# Patient Record
Sex: Female | Born: 1935 | Race: White | Hispanic: No | Smoking: Former smoker
Health system: Southern US, Community
[De-identification: ages and names within clinical notes are randomized; demographics above are authoritative.]

## PROBLEM LIST (undated history)

## (undated) DIAGNOSIS — E1140 Type 2 diabetes mellitus with diabetic neuropathy, unspecified: Secondary | ICD-10-CM

## (undated) DIAGNOSIS — I2510 Atherosclerotic heart disease of native coronary artery without angina pectoris: Secondary | ICD-10-CM

## (undated) DIAGNOSIS — I509 Heart failure, unspecified: Secondary | ICD-10-CM

## (undated) DIAGNOSIS — G2581 Restless legs syndrome: Secondary | ICD-10-CM

## (undated) DIAGNOSIS — J449 Chronic obstructive pulmonary disease, unspecified: Secondary | ICD-10-CM

## (undated) DIAGNOSIS — C189 Malignant neoplasm of colon, unspecified: Secondary | ICD-10-CM

## (undated) DIAGNOSIS — K219 Gastro-esophageal reflux disease without esophagitis: Secondary | ICD-10-CM

## (undated) DIAGNOSIS — N184 Chronic kidney disease, stage 4 (severe): Secondary | ICD-10-CM

## (undated) DIAGNOSIS — I255 Ischemic cardiomyopathy: Secondary | ICD-10-CM

## (undated) DIAGNOSIS — M1990 Unspecified osteoarthritis, unspecified site: Secondary | ICD-10-CM

## (undated) DIAGNOSIS — M109 Gout, unspecified: Secondary | ICD-10-CM

## (undated) DIAGNOSIS — E039 Hypothyroidism, unspecified: Secondary | ICD-10-CM

## (undated) DIAGNOSIS — I10 Essential (primary) hypertension: Secondary | ICD-10-CM

## (undated) DIAGNOSIS — M5136 Other intervertebral disc degeneration, lumbar region: Secondary | ICD-10-CM

## (undated) DIAGNOSIS — E119 Type 2 diabetes mellitus without complications: Secondary | ICD-10-CM

## (undated) DIAGNOSIS — M51369 Other intervertebral disc degeneration, lumbar region without mention of lumbar back pain or lower extremity pain: Secondary | ICD-10-CM

## (undated) HISTORY — DX: Malignant neoplasm of colon, unspecified: C18.9

## (undated) HISTORY — DX: Type 2 diabetes mellitus without complications: E11.9

## (undated) HISTORY — DX: Essential (primary) hypertension: I10

## (undated) HISTORY — PX: ABDOMINAL HYSTERECTOMY: SHX81

## (undated) HISTORY — DX: Hypothyroidism, unspecified: E03.9

## (undated) HISTORY — PX: CORONARY ARTERY BYPASS GRAFT: SHX141

## (undated) HISTORY — DX: Ischemic cardiomyopathy: I25.5

## (undated) HISTORY — PX: ILIAC ARTERY STENT: SHX1786

## (undated) HISTORY — PX: THYROID SURGERY: SHX805

## (undated) HISTORY — PX: CHOLECYSTECTOMY: SHX55

## (undated) HISTORY — PX: RENAL ARTERY STENT: SHX2321

## (undated) HISTORY — DX: Chronic kidney disease, stage 4 (severe): N18.4

## (undated) HISTORY — PX: COLON RESECTION: SHX5231

---

## 2010-07-05 HISTORY — PX: LUNG SURGERY: SHX703

## 2010-07-25 DIAGNOSIS — I739 Peripheral vascular disease, unspecified: Secondary | ICD-10-CM | POA: Insufficient documentation

## 2010-07-25 DIAGNOSIS — I2510 Atherosclerotic heart disease of native coronary artery without angina pectoris: Secondary | ICD-10-CM | POA: Insufficient documentation

## 2010-07-25 DIAGNOSIS — E785 Hyperlipidemia, unspecified: Secondary | ICD-10-CM | POA: Insufficient documentation

## 2010-07-25 DIAGNOSIS — E039 Hypothyroidism, unspecified: Secondary | ICD-10-CM | POA: Insufficient documentation

## 2011-03-01 DIAGNOSIS — C189 Malignant neoplasm of colon, unspecified: Secondary | ICD-10-CM | POA: Insufficient documentation

## 2012-05-09 DIAGNOSIS — F419 Anxiety disorder, unspecified: Secondary | ICD-10-CM | POA: Insufficient documentation

## 2016-06-03 DIAGNOSIS — J449 Chronic obstructive pulmonary disease, unspecified: Secondary | ICD-10-CM | POA: Insufficient documentation

## 2017-03-20 ENCOUNTER — Emergency Department (HOSPITAL_COMMUNITY): Payer: Medicare Other

## 2017-03-20 ENCOUNTER — Inpatient Hospital Stay (HOSPITAL_COMMUNITY)
Admission: EM | Admit: 2017-03-20 | Discharge: 2017-03-22 | DRG: 690 | Disposition: A | Discharge status: Home / Self Care | Payer: Medicare Other | Attending: Internal Medicine | Admitting: Internal Medicine

## 2017-03-20 ENCOUNTER — Encounter (HOSPITAL_COMMUNITY): Payer: Self-pay | Admitting: Cardiology

## 2017-03-20 DIAGNOSIS — Z85038 Personal history of other malignant neoplasm of large intestine: Secondary | ICD-10-CM | POA: Diagnosis not present

## 2017-03-20 DIAGNOSIS — E1122 Type 2 diabetes mellitus with diabetic chronic kidney disease: Secondary | ICD-10-CM | POA: Diagnosis present

## 2017-03-20 DIAGNOSIS — E1140 Type 2 diabetes mellitus with diabetic neuropathy, unspecified: Secondary | ICD-10-CM | POA: Diagnosis not present

## 2017-03-20 DIAGNOSIS — E1121 Type 2 diabetes mellitus with diabetic nephropathy: Secondary | ICD-10-CM | POA: Diagnosis not present

## 2017-03-20 DIAGNOSIS — Z951 Presence of aortocoronary bypass graft: Secondary | ICD-10-CM

## 2017-03-20 DIAGNOSIS — Z794 Long term (current) use of insulin: Secondary | ICD-10-CM

## 2017-03-20 DIAGNOSIS — N183 Chronic kidney disease, stage 3 unspecified: Secondary | ICD-10-CM | POA: Diagnosis present

## 2017-03-20 DIAGNOSIS — N179 Acute kidney failure, unspecified: Secondary | ICD-10-CM | POA: Diagnosis present

## 2017-03-20 DIAGNOSIS — G8929 Other chronic pain: Secondary | ICD-10-CM | POA: Diagnosis not present

## 2017-03-20 DIAGNOSIS — M6281 Muscle weakness (generalized): Secondary | ICD-10-CM | POA: Diagnosis not present

## 2017-03-20 DIAGNOSIS — R531 Weakness: Secondary | ICD-10-CM

## 2017-03-20 DIAGNOSIS — Z79899 Other long term (current) drug therapy: Secondary | ICD-10-CM | POA: Diagnosis not present

## 2017-03-20 DIAGNOSIS — Z79891 Long term (current) use of opiate analgesic: Secondary | ICD-10-CM

## 2017-03-20 DIAGNOSIS — Z881 Allergy status to other antibiotic agents status: Secondary | ICD-10-CM

## 2017-03-20 DIAGNOSIS — I129 Hypertensive chronic kidney disease with stage 1 through stage 4 chronic kidney disease, or unspecified chronic kidney disease: Secondary | ICD-10-CM | POA: Diagnosis present

## 2017-03-20 DIAGNOSIS — N3000 Acute cystitis without hematuria: Secondary | ICD-10-CM

## 2017-03-20 DIAGNOSIS — I10 Essential (primary) hypertension: Secondary | ICD-10-CM | POA: Diagnosis present

## 2017-03-20 DIAGNOSIS — Z800 Family history of malignant neoplasm of digestive organs: Secondary | ICD-10-CM

## 2017-03-20 DIAGNOSIS — E119 Type 2 diabetes mellitus without complications: Secondary | ICD-10-CM

## 2017-03-20 DIAGNOSIS — Z8249 Family history of ischemic heart disease and other diseases of the circulatory system: Secondary | ICD-10-CM | POA: Diagnosis not present

## 2017-03-20 DIAGNOSIS — M069 Rheumatoid arthritis, unspecified: Secondary | ICD-10-CM | POA: Diagnosis not present

## 2017-03-20 DIAGNOSIS — Z803 Family history of malignant neoplasm of breast: Secondary | ICD-10-CM | POA: Diagnosis not present

## 2017-03-20 DIAGNOSIS — E860 Dehydration: Secondary | ICD-10-CM | POA: Diagnosis present

## 2017-03-20 DIAGNOSIS — N390 Urinary tract infection, site not specified: Principal | ICD-10-CM | POA: Diagnosis present

## 2017-03-20 DIAGNOSIS — Z23 Encounter for immunization: Secondary | ICD-10-CM

## 2017-03-20 DIAGNOSIS — N3001 Acute cystitis with hematuria: Secondary | ICD-10-CM | POA: Diagnosis not present

## 2017-03-20 HISTORY — DX: Essential (primary) hypertension: I10

## 2017-03-20 HISTORY — DX: Unspecified osteoarthritis, unspecified site: M19.90

## 2017-03-20 HISTORY — DX: Atherosclerotic heart disease of native coronary artery without angina pectoris: I25.10

## 2017-03-20 LAB — CBC WITH DIFFERENTIAL/PLATELET
BASOS PCT: 0 %
Basophils Absolute: 0 10*3/uL (ref 0.0–0.1)
EOS ABS: 0.1 10*3/uL (ref 0.0–0.7)
Eosinophils Relative: 0 %
HEMATOCRIT: 45.8 % (ref 36.0–46.0)
HEMOGLOBIN: 15 g/dL (ref 12.0–15.0)
LYMPHS ABS: 0.9 10*3/uL (ref 0.7–4.0)
Lymphocytes Relative: 5 %
MCH: 31.8 pg (ref 26.0–34.0)
MCHC: 32.8 g/dL (ref 30.0–36.0)
MCV: 97 fL (ref 78.0–100.0)
Monocytes Absolute: 1.5 10*3/uL — ABNORMAL HIGH (ref 0.1–1.0)
Monocytes Relative: 9 %
NEUTROS ABS: 13.7 10*3/uL — AB (ref 1.7–7.7)
NEUTROS PCT: 86 %
Platelets: 214 10*3/uL (ref 150–400)
RBC: 4.72 MIL/uL (ref 3.87–5.11)
RDW: 13.1 % (ref 11.5–15.5)
WBC: 16.1 10*3/uL — AB (ref 4.0–10.5)

## 2017-03-20 LAB — COMPREHENSIVE METABOLIC PANEL
ALT: 18 U/L (ref 14–54)
ANION GAP: 10 (ref 5–15)
AST: 25 U/L (ref 15–41)
Albumin: 3.6 g/dL (ref 3.5–5.0)
Alkaline Phosphatase: 63 U/L (ref 38–126)
BUN: 29 mg/dL — ABNORMAL HIGH (ref 6–20)
CALCIUM: 9.5 mg/dL (ref 8.9–10.3)
CHLORIDE: 105 mmol/L (ref 101–111)
CO2: 25 mmol/L (ref 22–32)
CREATININE: 2.14 mg/dL — AB (ref 0.44–1.00)
GFR, EST AFRICAN AMERICAN: 23 mL/min — AB (ref >60)
GFR, EST NON AFRICAN AMERICAN: 20 mL/min — AB (ref >60)
Glucose, Bld: 142 mg/dL — ABNORMAL HIGH (ref 65–99)
Potassium: 4.6 mmol/L (ref 3.5–5.1)
Sodium: 140 mmol/L (ref 135–145)
Total Bilirubin: 1 mg/dL (ref 0.3–1.2)
Total Protein: 6.9 g/dL (ref 6.5–8.1)

## 2017-03-20 LAB — URINALYSIS, ROUTINE W REFLEX MICROSCOPIC
Bilirubin Urine: NEGATIVE
Glucose, UA: NEGATIVE mg/dL
Hgb urine dipstick: NEGATIVE
KETONES UR: NEGATIVE mg/dL
Nitrite: NEGATIVE
PROTEIN: NEGATIVE mg/dL
Specific Gravity, Urine: 1.018 (ref 1.005–1.030)
pH: 5 (ref 5.0–8.0)

## 2017-03-20 LAB — I-STAT CG4 LACTIC ACID, ED
LACTIC ACID, VENOUS: 1.2 mmol/L (ref 0.5–1.9)
LACTIC ACID, VENOUS: 0.88 mmol/L (ref 0.5–1.9)

## 2017-03-20 LAB — I-STAT TROPONIN, ED: TROPONIN I, POC: 0 ng/mL (ref 0.00–0.08)

## 2017-03-20 LAB — GLUCOSE, CAPILLARY
Glucose-Capillary: 115 mg/dL — ABNORMAL HIGH (ref 65–99)
Glucose-Capillary: 113 mg/dL — ABNORMAL HIGH (ref 65–99)

## 2017-03-20 MED ORDER — HEPARIN SODIUM (PORCINE) 5000 UNIT/ML IJ SOLN
5000.0000 [IU] | Freq: Three times a day (TID) | INTRAMUSCULAR | Status: DC
  Administered 2017-03-20 – 2017-03-22 (×5): 5000 [IU] via SUBCUTANEOUS
  Filled 2017-03-20 (×5): qty 1

## 2017-03-20 MED ORDER — MIRTAZAPINE 15 MG PO TABS
15.0000 mg | ORAL_TABLET | Freq: Every day | ORAL | Status: DC
  Administered 2017-03-21: 15 mg via ORAL
  Filled 2017-03-20: qty 1

## 2017-03-20 MED ORDER — SODIUM CHLORIDE 0.9 % IV SOLN
INTRAVENOUS | Status: AC
  Administered 2017-03-20: 17:00:00 via INTRAVENOUS

## 2017-03-20 MED ORDER — ONDANSETRON HCL 4 MG PO TABS: 4.0000 mg | ORAL_TABLET | Freq: Four times a day (QID) | ORAL | Status: DC | PRN

## 2017-03-20 MED ORDER — CEFTRIAXONE SODIUM 1 G IJ SOLR
1.0000 g | Freq: Once | INTRAMUSCULAR | Status: AC
  Administered 2017-03-20: 1 g via INTRAVENOUS
  Filled 2017-03-20: qty 10

## 2017-03-20 MED ORDER — INFLUENZA VAC SPLIT HIGH-DOSE 0.5 ML IM SUSY
0.5000 mL | PREFILLED_SYRINGE | INTRAMUSCULAR | Status: AC
  Administered 2017-03-21: 0.5 mL via INTRAMUSCULAR
  Filled 2017-03-20: qty 0.5

## 2017-03-20 MED ORDER — OMEGA-3-ACID ETHYL ESTERS 1 G PO CAPS
1.0000 g | ORAL_CAPSULE | Freq: Every day | ORAL | Status: DC
  Administered 2017-03-20 – 2017-03-22 (×3): 1 g via ORAL
  Filled 2017-03-20 (×3): qty 1

## 2017-03-20 MED ORDER — INSULIN ASPART 100 UNIT/ML ~~LOC~~ SOLN
0.0000 [IU] | Freq: Three times a day (TID) | SUBCUTANEOUS | Status: DC
  Administered 2017-03-21: 1 [IU] via SUBCUTANEOUS

## 2017-03-20 MED ORDER — FAMOTIDINE 20 MG PO TABS
20.0000 mg | ORAL_TABLET | Freq: Two times a day (BID) | ORAL | Status: DC
  Administered 2017-03-20 – 2017-03-22 (×4): 20 mg via ORAL
  Filled 2017-03-20 (×4): qty 1

## 2017-03-20 MED ORDER — INSULIN GLARGINE 100 UNITS/ML SOLOSTAR PEN
26.0000 [IU] | PEN_INJECTOR | Freq: Every day | SUBCUTANEOUS | Status: DC
  Filled 2017-03-20: qty 3

## 2017-03-20 MED ORDER — VITAMIN D 1000 UNITS PO TABS
5000.0000 [IU] | ORAL_TABLET | Freq: Every day | ORAL | Status: DC
  Administered 2017-03-20 – 2017-03-22 (×3): 5000 [IU] via ORAL
  Filled 2017-03-20 (×4): qty 5

## 2017-03-20 MED ORDER — SODIUM CHLORIDE 0.9 % IV BOLUS (SEPSIS)
1000.0000 mL | Freq: Once | INTRAVENOUS | Status: AC
  Administered 2017-03-20: 1000 mL via INTRAVENOUS

## 2017-03-20 MED ORDER — HYDROCODONE-ACETAMINOPHEN 10-325 MG PO TABS
1.0000 | ORAL_TABLET | Freq: Four times a day (QID) | ORAL | Status: DC | PRN
Start: 2017-03-20 — End: 2017-03-22

## 2017-03-20 MED ORDER — VITAMIN D3 125 MCG (5000 UT) PO CAPS: 1.0000 | ORAL_CAPSULE | Freq: Every day | ORAL | Status: DC

## 2017-03-20 MED ORDER — ONDANSETRON HCL 4 MG/2ML IJ SOLN: 4.0000 mg | Freq: Four times a day (QID) | INTRAMUSCULAR | Status: DC | PRN

## 2017-03-20 MED ORDER — DEXTROSE 5 % IV SOLN
1.0000 g | INTRAVENOUS | Status: DC
  Administered 2017-03-21: 1 g via INTRAVENOUS
  Filled 2017-03-20 (×2): qty 10

## 2017-03-20 MED ORDER — OMEGA-3 FISH OIL 1000 MG PO CAPS: 1.0000 | ORAL_CAPSULE | Freq: Every day | ORAL | Status: DC

## 2017-03-20 MED ORDER — GABAPENTIN 300 MG PO CAPS
300.0000 mg | ORAL_CAPSULE | Freq: Three times a day (TID) | ORAL | Status: DC
  Administered 2017-03-20 – 2017-03-22 (×6): 300 mg via ORAL
  Filled 2017-03-20 (×6): qty 1

## 2017-03-20 MED ORDER — HYDROXYCHLOROQUINE SULFATE 200 MG PO TABS
400.0000 mg | ORAL_TABLET | Freq: Every day | ORAL | Status: DC
  Administered 2017-03-21 – 2017-03-22 (×2): 400 mg via ORAL
  Filled 2017-03-20 (×5): qty 2

## 2017-03-20 NOTE — ED Triage Notes (Signed)
Woke up at 730 this morning with dizziness and weakness and went back to bed.  Woke up with the same symptoms.  CBG 127.  Temp 99.9.  Pt not orthostatic with EMS.

## 2017-03-20 NOTE — ED Provider Notes (Signed)
Emergency Department Provider Note   I have reviewed the triage vital signs and the nursing notes.   HISTORY  Chief Complaint Weakness   HPI Debra [name] is a 83 y.o. female with PMH of DM, HTN, and arthritis presents to the emergency department for evaluation of severe lightheadedness and near-syncope. The patient woke up at 7:30 this morning with these symptoms but went back to sleep. When she woke up later the symptoms remained. She denies any vertigo. She got up to go the bathroom and felt like she may pass out but did not. She denies any pain in the chest, difficulty breathing, palpitations. She denies any abdominal pain, nausea, vomiting, diarrhea. She continues to have her chronic lower back pain that is not new or different. Denies any dysuria, hesitancy, urgency. No recent medication changes. Family at bedside note shaking chills this AM. Temp 99 F with EMS.    Past Medical History:  Diagnosis Date  . Arthritis   . Cancer [facility])    colon cancer  . Coronary artery disease   . Diabetes mellitus without complication ([place])   . Hypertension   . Renal disorder    Pt has only one functioning kidney     Patient Active Problem List   Diagnosis Date Noted  . UTI (urinary tract infection) 03/20/2017  . Hypertension 03/20/2017  . AKI (acute kidney injury) ([place]) 03/20/2017  . Chronic renal insufficiency, stage 3 (moderate) 03/20/2017  . Rheumatoid arthritis ([place]) 03/20/2017  . Type 2 diabetes mellitus ([place]) 03/20/2017    Past Surgical History:  Procedure Laterality Date  . COLON RESECTION    . CORONARY ARTERY BYPASS GRAFT        Allergies Ciprofloxacin  Family History  Problem Relation Age of Onset  . Colon cancer Mother   . Heart disease Father   . Breast cancer Sister     Social History Social History  Substance Use Topics  . Smoking status: Never Smoker  . Smokeless tobacco: Never Used  . Alcohol use No    Review of Systems  Constitutional: No  fever. Positive rigors with generalized weakness and near-syncope.  Eyes: No visual changes. ENT: No sore throat. Cardiovascular: Denies chest pain. Respiratory: Denies shortness of breath. Gastrointestinal: No abdominal pain.  No nausea, no vomiting.  No diarrhea.  No constipation. Genitourinary: Negative for dysuria. Musculoskeletal: Negative for back pain. Skin: Negative for rash. Neurological: Negative for headaches, focal weakness or numbness.  10-point ROS otherwise negative.  ____________________________________________   PHYSICAL EXAM:  VITAL SIGNS: ED Triage Vitals  Enc Vitals Group     BP 03/20/17 1304 (!) 104/52     Pulse Rate 03/20/17 1304 73     Resp 03/20/17 1304 (!) 27     Temp 03/20/17 1304 99.4 F (37.4 C)     Temp Source 03/20/17 1304 Oral     SpO2 03/20/17 1304 94 %     Weight 03/20/17 1302 185 lb (83.9 kg)     Height 03/20/17 1302 5\' 7"  (1.702 m)     Pain Score 03/20/17 1259 7   Constitutional: Alert but very fatigued and weak. No acute distress.  Eyes: Conjunctivae are normal.  Head: Atraumatic. Nose: No congestion/rhinnorhea. Mouth/Throat: Mucous membranes are moist. Neck: No stridor.   Cardiovascular: Normal rate, regular rhythm. Good peripheral circulation. Grossly normal heart sounds.   Respiratory: Mild increased respiratory effort.  No retractions. Lungs CTAB. Gastrointestinal: Soft and nontender. No distention.  Musculoskeletal: No lower extremity tenderness nor edema.  No gross deformities of extremities. Neurologic:  Normal speech and language. No gross focal neurologic deficits are appreciated.  Skin:  Skin is warm, dry and intact. No rash noted.   ____________________________________________   LABS (all labs ordered are listed, but only abnormal results are displayed)  Labs Reviewed  COMPREHENSIVE METABOLIC PANEL - Abnormal; Notable for the following:       Result Value   Glucose, Bld 142 (*)    BUN 29 (*)    Creatinine, Ser 2.14  (*)    GFR calc non Af Amer 20 (*)    GFR calc Af Amer 23 (*)    All other components within normal limits  CBC WITH DIFFERENTIAL/PLATELET - Abnormal; Notable for the following:    WBC 16.1 (*)    Neutro Abs 13.7 (*)    Monocytes Absolute 1.5 (*)    All other components within normal limits  URINALYSIS, ROUTINE W REFLEX MICROSCOPIC - Abnormal; Notable for the following:    APPearance HAZY (*)    Leukocytes, UA MODERATE (*)    Bacteria, UA RARE (*)    Squamous Epithelial / LPF 6-30 (*)    All other components within normal limits  GLUCOSE, CAPILLARY - Abnormal; Notable for the following:    Glucose-Capillary 115 (*)    All other components within normal limits  CULTURE, BLOOD (ROUTINE X 2)  CULTURE, BLOOD (ROUTINE X 2)  URINE CULTURE  CBC WITH DIFFERENTIAL/PLATELET  BASIC METABOLIC PANEL  I-STAT CG4 LACTIC ACID, ED  I-STAT TROPONIN, ED  I-STAT CG4 LACTIC ACID, ED   ____________________________________________  EKG   EKG Interpretation  Date/Time:  Sunday March 20 2017 13:05:52 EDT Ventricular Rate:  71 PR Interval:    QRS Duration: 93 QT Interval:  372 QTC Calculation: 405 R Axis:   87 Text Interpretation:  Sinus rhythm Atrial premature complexes Borderline right axis deviation Low voltage, precordial leads Nonspecific T abnrm, anterolateral leads No STEMI.  Confirmed by Nanda Quinton [phone]) on 03/20/2017 1:36:41 PM       ____________________________________________  RADIOLOGY  Dg Chest 2 View  Result Date: 03/20/2017 CLINICAL DATA:  Rigors.  Lightheadedness. EXAM: CHEST  2 VIEW COMPARISON:  None FINDINGS: Moderate cardiac enlargement. Previous median sternotomy. No pleural effusion or edema. No airspace opacities identified. IMPRESSION: 1. No acute cardiopulmonary abnormalities. Electronically Signed   By: Kerby Moors M.D.   On: 03/20/2017 14:52    ____________________________________________   PROCEDURES  Procedure(s) performed:    Procedures  None ____________________________________________   INITIAL IMPRESSION / ASSESSMENT AND PLAN / ED COURSE  Pertinent labs & imaging results that were available during my care of the patient were reviewed by me and considered in my medical decision making (see chart for details).  Patient presents to the emergency department for evaluation of significant lightheadedness and near syncope with associated shaking chills. Afebrile. No pain. No focal neurological deficits. Plan for sepsis workup. No indication at this time for CT imaging of the head or abdomen. Her abdomen is soft and nontender. Back pain described as chronic and unchanged. Plan for IVF but hold on abx for now.   Patient with UTI on labs. Suspect that this is the cause of weakness. Patient also with worsening renal function. Care Everywhere shows baseline Creatinine around 1.5-1.8. Continue IVF and started Rocephin.   Discussed patient's case with Hospitalist, Dr. Olevia Bowens to request admission. Patient and family (if present) updated with plan. Care transferred to Hospitalist service.  I reviewed all nursing notes, vitals, pertinent  old records, EKGs, labs, imaging (as available).   ____________________________________________  FINAL CLINICAL IMPRESSION(S) / ED DIAGNOSES  Final diagnoses:  Acute cystitis without hematuria  Weakness  AKI (acute kidney injury) ([place])     MEDICATIONS GIVEN DURING THIS VISIT:  Medications  famotidine (PEPCID) tablet 20 mg (not administered)  HYDROcodone-acetaminophen (NORCO) 10-325 MG per tablet 1 tablet (not administered)  gabapentin (NEURONTIN) capsule 300 mg (300 mg Oral Given 03/20/17 1720)  hydroxychloroquine (PLAQUENIL) tablet 400 mg (not administered)  mirtazapine (REMERON) tablet 15 mg (not administered)  heparin injection 5,000 Units (5,000 Units Subcutaneous Given 03/20/17 1719)  0.9 %  sodium chloride infusion ( Intravenous New Bag/Given 03/20/17 1711)  ondansetron  (ZOFRAN) tablet 4 mg (not administered)    Or  ondansetron (ZOFRAN) injection 4 mg (not administered)  insulin aspart (novoLOG) injection 0-9 Units (0 Units Subcutaneous Not Given 03/20/17 1711)  cholecalciferol (VITAMIN D) tablet 5,000 Units (5,000 Units Oral Given 03/20/17 1720)  omega-3 acid ethyl esters (LOVAZA) capsule 1 g (1 g Oral Given 03/20/17 1720)  Influenza vac split quadrivalent PF (FLUZONE HIGH-DOSE) injection 0.5 mL (not administered)  insulin glargine (LANTUS) Solostar Pen 26 Units (not administered)  cefTRIAXone (ROCEPHIN) 1 g in dextrose 5 % 50 mL IVPB (not administered)  sodium chloride 0.9 % bolus 1,000 mL (0 mLs Intravenous Stopped 03/20/17 1452)  cefTRIAXone (ROCEPHIN) 1 g in dextrose 5 % 50 mL IVPB (0 g Intravenous Stopped 03/20/17 1621)     NEW OUTPATIENT MEDICATIONS STARTED DURING THIS VISIT:  None   Note:  This document was prepared using Dragon voice recognition software and may include unintentional dictation errors.  Nanda Quinton, MD Emergency Medicine   Long, [facility], MD 03/20/17 2009

## 2017-03-20 NOTE — H&P (Signed)
History and Physical    Debra [name] DEY:[id number] DOB: January 25, 1929 DOA: 03/20/2017  PCP: System, Provider Not In (  Patient coming from: Home.  I have personally briefly reviewed patient's old medical records in [place]  Chief Complaint: Weakness  HPI: Debra [name] is a 83 y.o. female with medical history significant of osteoarthritis, rheumatoid arthritis, history of colon cancer, type 2 diabetes, hypertension, renal disorder who is coming to the emergency department with complaints of waking up dizzy and weak around 7:30 AM in the morning, falling back to sleep for a couple hours hours and waking up with similar symptoms. She also mentions that she had a 99.9 temperature at home and EMS descries the patient being orthostatic on their initial evaluation. She denies chest pain, palpitations, diaphoresis, lower extremity edema, PND, orthopnea, abdominal pain, nausea, emesis, diarrhea, melena, hematochezia, dysuria, frequency or hematuria. Denies polyuria, polydipsia, polyphagia or blurred vision. She mentions that she was in her usual state of health yesterday evening when she went to sleep.  ED Course: Initial vital signs in the emergency department were temperature 99.71F, pulse 73, respirations 27, O2 sat 94% on room air and blood pressure 104/52 mmHg. She received a liter of normal saline, 1 g of ceftriaxone IVPB in the emergency department.  Her workup shows WBC is 16.1 with 86% neutrophils, hemoglobin of 15 g/dL and platelets 214. Sodium 140, potassium 4.6, chloride 105, bicarbonate 25 and lactic acid 1.2 mmol/L. BUN was 29, creatinine 2.14 (care everywhere shows baseline creatinine between 1.5-1.8) and glucose 142 mg/dL. Her LFTs were normal. Blood and urine cultures and sensitivity were sent to the lab.  Review of Systems: As per HPI otherwise 10 point review of systems negative.    Past Medical History:  Diagnosis Date  . Arthritis   . Cancer [facility])    colon cancer  .  Diabetes mellitus without complication ([place])   . Hypertension   . Renal disorder    Pt has only one functioning kidney     Past Surgical History:  Procedure Laterality Date  . COLON RESECTION    . CORONARY ARTERY BYPASS GRAFT       reports that she has never smoked. She has never used smokeless tobacco. She reports that she does not drink alcohol or use drugs.  Allergies  Allergen Reactions  . Ciprofloxacin Rash   Family History  Problem Relation Age of Onset  . Colon cancer Mother   . Heart disease Father   . Breast cancer Sister     Prior to Admission medications   Medication Sig Start Date End Date Taking? Authorizing Provider  amLODipine (NORVASC) 10 MG tablet Take 10 mg by mouth daily.   Yes [provider]  Cholecalciferol (VITAMIN D3) 5000 units CAPS Take 1 capsule by mouth daily.   Yes [provider]  gabapentin (NEURONTIN) 300 MG capsule Take 300 mg by mouth 3 (three) times daily.   Yes [provider]  HYDROcodone-acetaminophen (NORCO) 10-325 MG tablet Take 1 tablet by mouth every 6 (six) hours as needed for moderate pain or severe pain.   Yes [provider]  hydroxychloroquine (PLAQUENIL) 200 MG tablet Take 400 mg by mouth daily.   Yes [provider]  insulin glargine (LANTUS) 100 unit/mL SOPN Inject 26 Units into the skin at bedtime.   Yes [provider]  metoprolol succinate (TOPROL-XL) 25 MG 24 hr tablet Take 25 mg by mouth daily.   Yes [provider]  mirtazapine (  REMERON) 15 MG tablet Take 15 mg by mouth at bedtime.   Yes [provider]  omega-3 fish oil (MAXEPA) 1000 MG CAPS capsule Take 1 capsule by mouth daily. 600mg  epa/400mg  dha plus vitamin e 5iu   Yes [provider]  ranitidine (ZANTAC) 150 MG tablet Take 150 mg by mouth 2 (two) times daily.   Yes [provider]    Physical Exam: Vitals:   03/20/17 1430 03/20/17 1453 03/20/17 1500 03/20/17 1530  BP: (!)  127/51  (!) 117/52 110/85  Pulse: (!) 36  63 62  Resp: (!) 24  (!) 26 (!) 23  Temp:  99 F (37.2 C)    TempSrc:  Oral    SpO2: 93%  93% 94%  Weight:      Height:        Constitutional: NAD, calm, comfortable Eyes: PERRL, lids and conjunctivae normal ENMT: Mucous membranes are moist. Posterior pharynx clear of any exudate or lesions. Neck: normal, supple, no masses, no thyromegaly Respiratory: clear to auscultation bilaterally, no wheezing, no crackles. Normal respiratory effort. No accessory muscle use.  Cardiovascular: Bradycardic at 59 BPM no murmurs / rubs / gallops. No extremity edema. 2+ pedal pulses. No carotid bruits.  Abdomen: Soft, no tenderness, no masses palpated. No hepatosplenomegaly. Bowel sounds positive.  Musculoskeletal: no clubbing / cyanosis. Good ROM, no contractures. Normal muscle tone.  Skin: no rashes, lesions, ulcers on limited skin exam. Neurologic: CN 2-12 grossly intact. Sensation intact, DTR normal. Generalized weakness. Psychiatric: Normal judgment and insight. Alert and oriented x 4. Normal mood.    Labs on Admission: I have personally reviewed following labs and imaging studies  CBC:  Recent Labs Lab 03/20/17 1335  WBC 16.1*  NEUTROABS 13.7*  HGB 15.0  HCT 45.8  MCV 97.0  PLT 419   Basic Metabolic Panel:  Recent Labs Lab 03/20/17 1335  NA 140  K 4.6  CL 105  CO2 25  GLUCOSE 142*  BUN 29*  CREATININE 2.14*  CALCIUM 9.5   GFR: Estimated Creatinine Clearance: 21.4 mL/min (A) (by C-G formula based on SCr of 2.14 mg/dL (H)). Liver Function Tests:  Recent Labs Lab 03/20/17 1335  AST 25  ALT 18  ALKPHOS 63  BILITOT 1.0  PROT 6.9  ALBUMIN 3.6   No results for input(s): LIPASE, AMYLASE in the last 168 hours. No results for input(s): AMMONIA in the last 168 hours. Coagulation Profile: No results for input(s): INR, PROTIME in the last 168 hours. Cardiac Enzymes: No results for input(s): CKTOTAL, CKMB, CKMBINDEX, TROPONINI in  the last 168 hours. BNP (last 3 results) No results for input(s): PROBNP in the last 8760 hours. HbA1C: No results for input(s): HGBA1C in the last 72 hours. CBG: No results for input(s): GLUCAP in the last 168 hours. Lipid Profile: No results for input(s): CHOL, HDL, LDLCALC, TRIG, CHOLHDL, LDLDIRECT in the last 72 hours. Thyroid Function Tests: No results for input(s): TSH, T4TOTAL, FREET4, T3FREE, THYROIDAB in the last 72 hours. Anemia Panel: No results for input(s): VITAMINB12, FOLATE, FERRITIN, TIBC, IRON, RETICCTPCT in the last 72 hours. Urine analysis:    Component Value Date/Time   COLORURINE YELLOW 03/20/2017 1450   APPEARANCEUR HAZY (A) 03/20/2017 1450   LABSPEC 1.018 03/20/2017 1450   PHURINE 5.0 03/20/2017 1450   GLUCOSEU NEGATIVE 03/20/2017 1450   HGBUR NEGATIVE 03/20/2017 1450   BILIRUBINUR NEGATIVE 03/20/2017 1450   KETONESUR NEGATIVE 03/20/2017 1450   PROTEINUR NEGATIVE 03/20/2017 1450   NITRITE NEGATIVE 03/20/2017 1450  LEUKOCYTESUR MODERATE (A) 03/20/2017 1450    Radiological Exams on Admission: Dg Chest 2 View  Result Date: 03/20/2017 CLINICAL DATA:  Rigors.  Lightheadedness. EXAM: CHEST  2 VIEW COMPARISON:  None FINDINGS: Moderate cardiac enlargement. Previous median sternotomy. No pleural effusion or edema. No airspace opacities identified. IMPRESSION: 1. No acute cardiopulmonary abnormalities. Electronically Signed   By: Kerby Moors M.D.   On: 03/20/2017 14:52    EKG: Independently reviewed. Vent. rate 71 BPM PR interval * ms QRS duration 93 ms QT/QTc 372/405 ms P-R-T axes 84 87 92 Sinus rhythm Atrial premature complexes Borderline right axis deviation Low voltage, precordial leads Nonspecific T abnrm, anterolateral leads No previous EKG to compare to.  Assessment/Plan Principal Problem:   UTI (urinary tract infection) Admit to inpatient/telemetry. Continue IV fluids. Continue ceftriaxone 1 g IV PB every 24 hours. Follow-up blood culture  and sensitivity. Follow urine culture and sensitivity.  Active Problems:   Hypertension Hold antihypertensives for now. Monitor blood pressure and heart rate.     AKI (acute kidney injury) ([place])   Chronic renal insufficiency, stage 3 (moderate) Continue IV hydration. Monitor intake and output. Follow-up renal function and electrolytes.    Rheumatoid arthritis (HCC) Continue plaquenil 400 mg by mouth daily Continue Norco as needed for moderate or severe pain.    Type 2 diabetes mellitus (HCC) Carbohydrate modified diet. Hold Lantus tonight since the patient has not eaten much today. CBG monitoring Regular Insulin sliding scale.   DVT prophylaxis: Heparin SQ Code Status: Full code Family Communication:  Disposition Plan: Admit for IV hydration and IV antibiotics. Consults called:  Admission status: Telemetry/inpatient.   Reubin Milan MD [facility] Pager [phone].  If 7PM-7AM, please contact night-coverage [email or web address] Password [email or web address]  03/20/2017, 4:07 PM

## 2017-03-21 DIAGNOSIS — N390 Urinary tract infection, site not specified: Secondary | ICD-10-CM | POA: Diagnosis not present

## 2017-03-21 DIAGNOSIS — N3001 Acute cystitis with hematuria: Secondary | ICD-10-CM

## 2017-03-21 DIAGNOSIS — R531 Weakness: Secondary | ICD-10-CM | POA: Diagnosis not present

## 2017-03-21 LAB — GLUCOSE, CAPILLARY
GLUCOSE-CAPILLARY: 92 mg/dL (ref 65–99)
GLUCOSE-CAPILLARY: 117 mg/dL — AB (ref 65–99)
Glucose-Capillary: 121 mg/dL — ABNORMAL HIGH (ref 65–99)
Glucose-Capillary: 138 mg/dL — ABNORMAL HIGH (ref 65–99)
Glucose-Capillary: 163 mg/dL — ABNORMAL HIGH (ref 65–99)

## 2017-03-21 LAB — CBC WITH DIFFERENTIAL/PLATELET
BASOS ABS: 0 10*3/uL (ref 0.0–0.1)
Basophils Relative: 0 %
Eosinophils Absolute: 0.1 10*3/uL (ref 0.0–0.7)
Eosinophils Relative: 1 %
HEMATOCRIT: 43.7 % (ref 36.0–46.0)
Hemoglobin: 14.2 g/dL (ref 12.0–15.0)
LYMPHS PCT: 11 %
Lymphs Abs: 1.4 10*3/uL (ref 0.7–4.0)
MCH: 31.6 pg (ref 26.0–34.0)
MCHC: 32.5 g/dL (ref 30.0–36.0)
MCV: 97.1 fL (ref 78.0–100.0)
MONO ABS: 1.1 10*3/uL — AB (ref 0.1–1.0)
Monocytes Relative: 9 %
NEUTROS ABS: 10 10*3/uL — AB (ref 1.7–7.7)
NEUTROS PCT: 79 %
Platelets: 211 10*3/uL (ref 150–400)
RBC: 4.5 MIL/uL (ref 3.87–5.11)
RDW: 13.4 % (ref 11.5–15.5)
WBC: 12.6 10*3/uL — ABNORMAL HIGH (ref 4.0–10.5)

## 2017-03-21 LAB — BASIC METABOLIC PANEL
ANION GAP: 7 (ref 5–15)
BUN: 23 mg/dL — ABNORMAL HIGH (ref 6–20)
CALCIUM: 8.9 mg/dL (ref 8.9–10.3)
CHLORIDE: 107 mmol/L (ref 101–111)
CO2: 26 mmol/L (ref 22–32)
Creatinine, Ser: 1.82 mg/dL — ABNORMAL HIGH (ref 0.44–1.00)
GFR calc Af Amer: 28 mL/min — ABNORMAL LOW (ref >60)
GFR calc non Af Amer: 24 mL/min — ABNORMAL LOW (ref >60)
Glucose, Bld: 101 mg/dL — ABNORMAL HIGH (ref 65–99)
Potassium: 4.2 mmol/L (ref 3.5–5.1)
Sodium: 140 mmol/L (ref 135–145)

## 2017-03-21 MED ORDER — INSULIN GLARGINE 100 UNIT/ML ~~LOC~~ SOLN
26.0000 [IU] | Freq: Every day | SUBCUTANEOUS | Status: DC
  Administered 2017-03-21: 26 [IU] via SUBCUTANEOUS
  Filled 2017-03-21 (×2): qty 0.26

## 2017-03-21 NOTE — Progress Notes (Addendum)
Patient ID: Debra [name], female   DOB: [birth year], 83 y.o.   MRN: [id number]    PROGRESS NOTE  Debra [name]  PXT:[id number] DOB: 1929-06-04 DOA: 03/20/2017  PCP: System, Provider Not In   Brief Narrative:  Patient is 83 year old female with known history of osteoarthritis and rheumatoid arthritis, type 2 diabetes, hypertension, presented to emergency department with main concern of sudden onset of generalized weakness, fatigue, subjective fevers and chills. Patient also noted poor urine output. She called EMS and per EMS patient was orthostatic on initial evaluation. Patient has denied chest pain and dyspnea, no recent similar events.  Assessment & Plan:   Principal Problem:   Generalized weakness - Suspect this is due to dehydration in the setting of urinary tract infection and poor oral intake - Orthostatic vitals initially positive per EMS - Patient has been hydrated with IV fluids - We will repeat orthostatic vitals - Physical therapy evaluation    UTI (urinary tract infection) - Patient started on Rocephin, continue day #2 - Follow-up on urine culture and readjust the regimen as clinically indicated  Active Problems:   Hypertension, essential - Reasonable inpatient control    AKI (acute kidney injury) ([place]) imposed on CKD stage III - Suspect prerenal etiology from dehydration and poor oral intake - IV fluids provided and creatinine trending down - Repeat BMP in the morning    Rheumatoid arthritis ([place]) - Resume home medical regimen    Type 2 diabetes mellitus ([place]) with complications of nephropathy and neuropathy - Keep on insulin Glargine - also continue SSI  - Continue gabapentin  DVT prophylaxis: Heparin SQ Code Status: Full Family Communication: Patient at bedside  Disposition Plan: Home in 1-2 days, discontinue telemetry, no clear indication for cardiac monitor at this time as patient with no chest pain and no arrhythmia  Consultants:   None  Procedures:    None  Antimicrobials:   Rocephin 03/20/2017 -->  Subjective: No events overnight. Patient reports feeling better.  Objective: Vitals:   03/20/17 1648 03/20/17 2125 03/21/17 0556 03/21/17 1129  BP: 127/62 (!) 133/37 (!) 132/57   Pulse: 69 64 77   Resp: (!) 22 (!) 22 (!) 22   Temp: 98 F (36.7 C) 98.4 F (36.9 C) 98 F (36.7 C)   TempSrc: Oral Oral Oral   SpO2: 95% 90% 95% (!) 88%  Weight: 85.8 kg (189 lb 2.5 oz)     Height: 5\' 7"  (1.702 m)       Intake/Output Summary (Last 24 hours) at 03/21/17 1208 Last data filed at 03/21/17 0900  Gross per 24 hour  Intake          1411.25 ml  Output             1500 ml  Net           -88.75 ml   Filed Weights   03/20/17 1302 03/20/17 1648  Weight: 83.9 kg (185 lb) 85.8 kg (189 lb 2.5 oz)    Examination:  General exam: Appears calm and comfortable  Respiratory system: Clear to auscultation. Respiratory effort normal. Cardiovascular system: S1 & S2 heard, RRR. No JVD, murmurs, rubs, gallops or clicks. No pedal edema. Gastrointestinal system: Abdomen is nondistended, soft and nontender. No organomegaly or masses felt. Central nervous system: Alert and oriented. No focal neurological deficits  Psychiatry: Judgement and insight appear normal. Mood & affect appropriate.    Data Reviewed: I have personally reviewed following labs and imaging studies  CBC:  Recent  Labs Lab 03/20/17 1335 03/21/17 0638  WBC 16.1* 12.6*  NEUTROABS 13.7* 10.0*  HGB 15.0 14.2  HCT 45.8 43.7  MCV 97.0 97.1  PLT 214 631   Basic Metabolic Panel:  Recent Labs Lab 03/20/17 1335 03/21/17 0638  NA 140 140  K 4.6 4.2  CL 105 107  CO2 25 26  GLUCOSE 142* 101*  BUN 29* 23*  CREATININE 2.14* 1.82*  CALCIUM 9.5 8.9   Liver Function Tests:  Recent Labs Lab 03/20/17 1335  AST 25  ALT 18  ALKPHOS 63  BILITOT 1.0  PROT 6.9  ALBUMIN 3.6   CBG:  Recent Labs Lab 03/20/17 1710 03/20/17 2131 03/21/17 0722 03/21/17 1102  GLUCAP 115*  113* 92 117*   Urine analysis:    Component Value Date/Time   COLORURINE YELLOW 03/20/2017 1450   APPEARANCEUR HAZY (A) 03/20/2017 1450   LABSPEC 1.018 03/20/2017 1450   PHURINE 5.0 03/20/2017 1450   GLUCOSEU NEGATIVE 03/20/2017 1450   HGBUR NEGATIVE 03/20/2017 1450   BILIRUBINUR NEGATIVE 03/20/2017 1450   KETONESUR NEGATIVE 03/20/2017 1450   PROTEINUR NEGATIVE 03/20/2017 1450   NITRITE NEGATIVE 03/20/2017 1450   LEUKOCYTESUR MODERATE (A) 03/20/2017 1450   Recent Results (from the past 240 hour(s))  Blood Culture (routine x 2)     Status: None (Preliminary result)   Collection Time: 03/20/17  1:36 PM  Result Value Ref Range Status   Specimen Description LEFT ANTECUBITAL  Final   Special Requests   Final    BOTTLES DRAWN AEROBIC AND ANAEROBIC Blood Culture adequate volume   Culture NO GROWTH < 24 HOURS  Final   Report Status PENDING  Incomplete  Blood Culture (routine x 2)     Status: None (Preliminary result)   Collection Time: 03/20/17  1:45 PM  Result Value Ref Range Status   Specimen Description BLOOD RIGHT HAND  Final   Special Requests   Final    BOTTLES DRAWN AEROBIC AND ANAEROBIC Blood Culture results may not be optimal due to an inadequate volume of blood received in culture bottles   Culture NO GROWTH < 24 HOURS  Final   Report Status PENDING  Incomplete    Radiology Studies: Dg Chest 2 View  Result Date: 03/20/2017 CLINICAL DATA:  Rigors.  Lightheadedness. EXAM: CHEST  2 VIEW COMPARISON:  None FINDINGS: Moderate cardiac enlargement. Previous median sternotomy. No pleural effusion or edema. No airspace opacities identified. IMPRESSION: 1. No acute cardiopulmonary abnormalities. Electronically Signed   By: Kerby Moors M.D.   On: 03/20/2017 14:52   Scheduled Meds: . cholecalciferol  5,000 Units Oral Daily  . famotidine  20 mg Oral BID  . gabapentin  300 mg Oral TID  . heparin  5,000 Units Subcutaneous Q8H  . hydroxychloroquine  400 mg Oral Daily  . insulin  aspart  0-9 Units Subcutaneous TID WC  . insulin glargine  26 Units Subcutaneous QHS  . mirtazapine  15 mg Oral QHS  . omega-3 acid ethyl esters  1 g Oral Daily   Continuous Infusions: . sodium chloride 75 mL/hr at 03/20/17 1711  . cefTRIAXone (ROCEPHIN)  IV       LOS: 1 day   Time spent: 25 minutes   Faye Ramsay, MD [facility] Pager [phone]  If 7PM-7AM, please contact night-coverage [email or web address] Password [email or web address] 03/21/2017, 12:08 PM

## 2017-03-21 NOTE — Progress Notes (Signed)
PT Cancellation Note  Patient Details Name: Debra [name] MRN: [id number] DOB: Oct 29, 1928   Cancelled Treatment:    Reason Eval/Treat Not Completed: Patient declined, no reason specified.  Pt is admitted with poor intake and UTI but declines PT evaluation.  Will keep active order to ck tomorrow, then DC if it is appropriate.   Ramond Dial 03/21/2017, 2:36 PM   2:37 PM, 03/21/17 Mee Hives, PT, MS Physical Therapist - [place] [phone] (Office)

## 2017-03-22 DIAGNOSIS — N3001 Acute cystitis with hematuria: Secondary | ICD-10-CM | POA: Diagnosis not present

## 2017-03-22 DIAGNOSIS — R531 Weakness: Secondary | ICD-10-CM | POA: Diagnosis not present

## 2017-03-22 DIAGNOSIS — N390 Urinary tract infection, site not specified: Secondary | ICD-10-CM | POA: Diagnosis not present

## 2017-03-22 DIAGNOSIS — Z23 Encounter for immunization: Secondary | ICD-10-CM | POA: Diagnosis present

## 2017-03-22 LAB — CBC WITH DIFFERENTIAL/PLATELET
Basophils Absolute: 0 10*3/uL (ref 0.0–0.1)
Basophils Relative: 0 %
EOS PCT: 3 %
Eosinophils Absolute: 0.3 10*3/uL (ref 0.0–0.7)
HEMATOCRIT: 42 % (ref 36.0–46.0)
Hemoglobin: 13.7 g/dL (ref 12.0–15.0)
LYMPHS PCT: 17 %
Lymphs Abs: 1.4 10*3/uL (ref 0.7–4.0)
MCH: 31.4 pg (ref 26.0–34.0)
MCHC: 32.6 g/dL (ref 30.0–36.0)
MCV: 96.3 fL (ref 78.0–100.0)
MONO ABS: 0.7 10*3/uL (ref 0.1–1.0)
MONOS PCT: 8 %
NEUTROS ABS: 6.3 10*3/uL (ref 1.7–7.7)
Neutrophils Relative %: 72 %
PLATELETS: 204 10*3/uL (ref 150–400)
RBC: 4.36 MIL/uL (ref 3.87–5.11)
RDW: 13.3 % (ref 11.5–15.5)
WBC: 8.8 10*3/uL (ref 4.0–10.5)

## 2017-03-22 LAB — URINE CULTURE: Special Requests: NORMAL

## 2017-03-22 LAB — BASIC METABOLIC PANEL
Anion gap: 8 (ref 5–15)
BUN: 18 mg/dL (ref 6–20)
CALCIUM: 8.9 mg/dL (ref 8.9–10.3)
CO2: 25 mmol/L (ref 22–32)
CREATININE: 1.69 mg/dL — AB (ref 0.44–1.00)
Chloride: 108 mmol/L (ref 101–111)
GFR calc Af Amer: 31 mL/min — ABNORMAL LOW (ref >60)
GFR, EST NON AFRICAN AMERICAN: 26 mL/min — AB (ref >60)
GLUCOSE: 113 mg/dL — AB (ref 65–99)
Potassium: 3.7 mmol/L (ref 3.5–5.1)
Sodium: 141 mmol/L (ref 135–145)

## 2017-03-22 LAB — GLUCOSE, CAPILLARY
Glucose-Capillary: 103 mg/dL — ABNORMAL HIGH (ref 65–99)
Glucose-Capillary: 127 mg/dL — ABNORMAL HIGH (ref 65–99)

## 2017-03-22 MED ORDER — NITROFURANTOIN MONOHYD MACRO 100 MG PO CAPS: 100.0000 mg | ORAL_CAPSULE | Freq: Two times a day (BID) | ORAL | 0 refills | Status: AC

## 2017-03-22 NOTE — Final Progress Note (Signed)
NURSING PROGRESS NOTE  Debra [name] [id number] Discharge Data: 03/22/2017 12:36 PM Attending Provider: Theodis Blaze, MD GYI:RSWNIO, Provider Not In     [name] to be D/C'd Home per MD order.  Discussed with the patient the After Visit Summary and all questions fully answered. All IV's discontinued with no bleeding noted. All belongings returned to patient for patient to take home. Patient given printed prescription for Macrobid.  Last Vital Signs:  Blood pressure (!) 144/74, pulse 70, temperature 98.2 F (36.8 C), temperature source Oral, resp. rate 20, height 5\' 7"  (1.702 m), weight 85.8 kg (189 lb 2.5 oz), SpO2 95 %.  Discharge Medication List Allergies as of 03/22/2017      Reactions   Ciprofloxacin Rash      Medication List    TAKE these medications   amLODipine 10 MG tablet Commonly known as:  NORVASC Take 10 mg by mouth daily.   gabapentin 300 MG capsule Commonly known as:  NEURONTIN Take 300 mg by mouth 3 (three) times daily.   HYDROcodone-acetaminophen 10-325 MG tablet Commonly known as:  NORCO Take 1 tablet by mouth every 6 (six) hours as needed for moderate pain or severe pain.   hydroxychloroquine 200 MG tablet Commonly known as:  PLAQUENIL Take 400 mg by mouth daily.   insulin glargine 100 unit/mL Sopn Commonly known as:  LANTUS Inject 26 Units into the skin at bedtime.   metoprolol succinate 25 MG 24 hr tablet Commonly known as:  TOPROL-XL Take 25 mg by mouth daily.   mirtazapine 15 MG tablet Commonly known as:  REMERON Take 15 mg by mouth at bedtime.   nitrofurantoin (macrocrystal-monohydrate) 100 MG capsule Commonly known as:  MACROBID Take 1 capsule (100 mg total) by mouth 2 (two) times daily.   omega-3 fish oil 1000 MG Caps capsule Commonly known as:  MAXEPA Take 1 capsule by mouth daily. 600mg  epa/400mg  dha plus vitamin e 5iu   ranitidine 150 MG tablet Commonly known as:  ZANTAC Take 150 mg by mouth 2 (two) times daily.   Vitamin D3  5000 units Caps Take 1 capsule by mouth daily.            Discharge Care Instructions        Start     Ordered   03/22/17 0000  nitrofurantoin, macrocrystal-monohydrate, (MACROBID) 100 MG capsule  2 times daily     03/22/17 1035   03/22/17 0000  Increase activity slowly     03/22/17 1035   03/22/17 0000  Diet - low sodium heart healthy     03/22/17 1035

## 2017-03-22 NOTE — Evaluation (Addendum)
Physical Therapy Evaluation Patient Details Name: Debra [name] MRN: [id number] DOB: [birth year] Today's Date: 03/22/2017   History of Present Illness  Debra [name] is a 83 y.o. female with medical history significant of osteoarthritis, rheumatoid arthritis, history of colon cancer, type 2 diabetes, hypertension, renal disorder who is coming to the emergency department with complaints of waking up dizzy and weak around 7:30 AM in the morning, falling back to sleep for a couple hours hours and waking up with similar symptoms. She also mentions that she had a 99.9 temperature at home and EMS descries the patient being orthostatic on their initial evaluation. She denies chest pain, palpitations, diaphoresis, lower extremity edema, PND, orthopnea, abdominal pain, nausea, emesis, diarrhea, melena, hematochezia, dysuria, frequency or hematuria. Denies polyuria, polydipsia, polyphagia or blurred vision. She mentions that she was in her usual state of health yesterday evening when she went to sleep.    Clinical Impression  Patient is functioning at baseline and demonstrates good return for completing functional mobility and gait without loss of balance.  Patient discharged to care of nursing for ambulation as tolerated for length of stay.    Follow Up Recommendations No PT follow up;Supervision - Intermittent    Equipment Recommendations  None recommended by PT    Recommendations for Other Services       Precautions / Restrictions Precautions Precautions: None Restrictions Weight Bearing Restrictions: No      Mobility  Bed Mobility Overal bed mobility: Independent                Transfers Overall transfer level: Independent                  Ambulation/Gait Ambulation/Gait assistance: Independent Ambulation Distance (Feet): 100 Feet Assistive device: None Gait Pattern/deviations: WFL(Within Functional Limits)   Gait velocity interpretation: at or above normal speed for  age/gender    Stairs            Wheelchair Mobility    Modified Rankin (Stroke Patients Only)       Balance Overall balance assessment: Independent                                           Pertinent Vitals/Pain Pain Assessment: No/denies pain    Home Living Family/patient expects to be discharged to:: Private residence Living Arrangements: Children Available Help at Discharge: Family Type of Home: House Home Access: Level entry     Home Layout: One level Home Equipment: Environmental consultant - 2 wheels;Shower seat;Bedside commode      Prior Function Level of Independence: Independent               Hand Dominance        Extremity/Trunk Assessment   Upper Extremity Assessment Upper Extremity Assessment: Overall WFL for tasks assessed    Lower Extremity Assessment Lower Extremity Assessment: Overall WFL for tasks assessed    Cervical / Trunk Assessment Cervical / Trunk Assessment: Normal  Communication   Communication: No difficulties  Cognition Arousal/Alertness: Awake/alert Behavior During Therapy: WFL for tasks assessed/performed Overall Cognitive Status: Within Functional Limits for tasks assessed                                        General Comments      Exercises  Assessment/Plan    PT Assessment Patent does not need any further PT services  PT Problem List         PT Treatment Interventions      PT Goals (Current goals can be found in the Care Plan section)       Frequency     Barriers to discharge        Co-evaluation               AM-PAC PT "6 Clicks" Daily Activity  Outcome Measure Difficulty turning over in bed (including adjusting bedclothes, sheets and blankets)?: None Difficulty moving from lying on back to sitting on the side of the bed? : None Difficulty sitting down on and standing up from a chair with arms (e.g., wheelchair, bedside commode, etc,.)?: None Help needed  moving to and from a bed to chair (including a wheelchair)?: None Help needed walking in hospital room?: None Help needed climbing 3-5 steps with a railing? : None 6 Click Score: 24    End of Session   Activity Tolerance: Patient tolerated treatment well Patient left: in bed;with call bell/phone within reach (seated at bedside) Nurse Communication: Mobility status PT Visit Diagnosis: Unsteadiness on feet (R26.81);Other abnormalities of gait and mobility (R26.89);Muscle weakness (generalized) (M62.81)    Time: 5110-2111 PT Time Calculation (min) (ACUTE ONLY): 24 min   Charges:   PT Evaluation $PT Eval Low Complexity: 1 Low PT Treatments $Gait Training: 23-37 mins $Therapeutic Activity: 23-37 mins           11:56 AM, 03/22/17 Lonell Grandchild, MPT Physical Therapist with [facility] [phone] office [phone] mobile phone   PT G Codes:   May 02, 2017 1328  PT G-Codes **NOT FOR INPATIENT CLASS**  Functional Assessment Tool Used AM-PAC 6 Clicks Basic Mobility  Functional Limitation Mobility: Walking and moving around  Mobility: Walking and Moving Around Current Status [phone]) [facility]  Mobility: Walking and Moving Around Goal Status [phone]) [facility]  Mobility: Walking and Moving Around Discharge Status [phone]) [place]   Addendum on 05-02-17 to include G Codes of findings from evaluation on 03/22/17.  1:30 PM, 2017-05-02 Lonell Grandchild, MPT Physical Therapist with [facility] [phone] office [phone] mobile phone

## 2017-03-22 NOTE — Discharge Summary (Signed)
Physician Discharge Summary  Pricilla Holm Deacon XIP:[id number] DOB: 10-Jul-1928 DOA: 03/20/2017  PCP: System, Provider Not In  Admit date: 03/20/2017 Discharge date: 03/22/2017  Recommendations for Outpatient Follow-up:  1. Pt will need to follow up with PCP in 2-3 weeks post discharge 2. Please obtain BMP to evaluate electrolytes and kidney function 3. Please also check CBC to evaluate Hg and Hct levels 4. Continue Nitrofurantoin for 5 more days post discharge   Discharge Diagnoses:  Principal Problem:   UTI (urinary tract infection) Active Problems:   Hypertension   AKI (acute kidney injury) ([place])   Chronic renal insufficiency, stage 3 (moderate)   Rheumatoid arthritis (HCC)   Type 2 diabetes mellitus ([place])  Discharge Condition: Stable  Diet recommendation: Heart healthy diet discussed in details   History of present illness:  Patient is 83 year old female with known history of osteoarthritis and rheumatoid arthritis, type 2 diabetes, hypertension, presented to emergency department with main concern of sudden onset of generalized weakness, fatigue, subjective fevers and chills. Patient also noted poor urine output. She called EMS and per EMS patient was orthostatic on initial evaluation. Patient has denied chest pain and dyspnea, no recent similar events.  Assessment & Plan:   Principal Problem:   Generalized weakness - Suspect this is due to dehydration in the setting of urinary tract infection and poor oral intake - Orthostatic vitals initially positive per EMS - Patient has been hydrated with IV fluids - pt reports feels better wants to go home     UTI (urinary tract infection) - Patient started on Rocephin, transition to oral Nitrofurantoin on discharge   Active Problems:   Hypertension, essential - Reasonable inpatient control    AKI (acute kidney injury) ([place]) imposed on CKD stage III - Suspect prerenal etiology from dehydration and poor oral intake - IV fluids  provided and creatinine trending down    Rheumatoid arthritis ([place]) - Resume home medical regimen    Type 2 diabetes mellitus ([place]) with complications of nephropathy and neuropathy - Keep on insulin Glargine - also continue SSI  - Continue gabapentin  DVT prophylaxis: Heparin SQ Code Status: Full Family Communication: Patient at bedside  Disposition Plan: Home   Consultants:   None  Procedures:   None  Antimicrobials:   Rocephin 03/20/2017 --> 9/18  Nitrofurantoin 9/18 --> for 5 more days post discharge   Procedures/Studies: Dg Chest 2 View  Result Date: 03/20/2017 CLINICAL DATA:  Rigors.  Lightheadedness. EXAM: CHEST  2 VIEW COMPARISON:  None FINDINGS: Moderate cardiac enlargement. Previous median sternotomy. No pleural effusion or edema. No airspace opacities identified. IMPRESSION: 1. No acute cardiopulmonary abnormalities. Electronically Signed   By: Kerby Moors M.D.   On: 03/20/2017 14:52     Discharge Exam: Vitals:   03/21/17 2149 03/22/17 0554  BP: (!) 153/81 (!) 144/74  Pulse:  70  Resp: 20 20  Temp: 98.4 F (36.9 C) 98.2 F (36.8 C)  SpO2: 96% 95%   Vitals:   03/21/17 0556 03/21/17 1129 03/21/17 2149 03/22/17 0554  BP: (!) 132/57  (!) 153/81 (!) 144/74  Pulse: 77   70  Resp: (!) 22  20 20   Temp: 98 F (36.7 C)  98.4 F (36.9 C) 98.2 F (36.8 C)  TempSrc: Oral  Oral Oral  SpO2: 95% (!) 88% 96% 95%  Weight:      Height:        General: Pt is alert, follows commands appropriately, not in acute distress Cardiovascular: Regular rate and  rhythm, no rubs, no gallops Respiratory: Clear to auscultation bilaterally, no wheezing, no crackles, no rhonchi Abdominal: Soft, non tender, non distended, bowel sounds +, no guarding Extremities: no edema, no cyanosis, pulses palpable bilaterally DP and PT Neuro: Grossly nonfocal  Discharge Instructions  Discharge Instructions    Diet - low sodium heart healthy    Complete by:  As directed     Increase activity slowly    Complete by:  As directed      Allergies as of 03/22/2017      Reactions   Ciprofloxacin Rash      Medication List    TAKE these medications   amLODipine 10 MG tablet Commonly known as:  NORVASC Take 10 mg by mouth daily.   gabapentin 300 MG capsule Commonly known as:  NEURONTIN Take 300 mg by mouth 3 (three) times daily.   HYDROcodone-acetaminophen 10-325 MG tablet Commonly known as:  NORCO Take 1 tablet by mouth every 6 (six) hours as needed for moderate pain or severe pain.   hydroxychloroquine 200 MG tablet Commonly known as:  PLAQUENIL Take 400 mg by mouth daily.   insulin glargine 100 unit/mL Sopn Commonly known as:  LANTUS Inject 26 Units into the skin at bedtime.   metoprolol succinate 25 MG 24 hr tablet Commonly known as:  TOPROL-XL Take 25 mg by mouth daily.   mirtazapine 15 MG tablet Commonly known as:  REMERON Take 15 mg by mouth at bedtime.   nitrofurantoin (macrocrystal-monohydrate) 100 MG capsule Commonly known as:  MACROBID Take 1 capsule (100 mg total) by mouth 2 (two) times daily.   omega-3 fish oil 1000 MG Caps capsule Commonly known as:  MAXEPA Take 1 capsule by mouth daily. 600mg  epa/400mg  dha plus vitamin e 5iu   ranitidine 150 MG tablet Commonly known as:  ZANTAC Take 150 mg by mouth 2 (two) times daily.   Vitamin D3 5000 units Caps Take 1 capsule by mouth daily.            Discharge Care Instructions        Start     Ordered   03/22/17 0000  nitrofurantoin, macrocrystal-monohydrate, (MACROBID) 100 MG capsule  2 times daily     03/22/17 1035   03/22/17 0000  Increase activity slowly     03/22/17 1035   03/22/17 0000  Diet - low sodium heart healthy     03/22/17 1035     Follow-up Information    Theodis Blaze, MD. Call.   Specialty:  Internal Medicine Why:  call my cell phone with any questions [phone]  Contact information: [address] [place]  [address] [phone]            The results of significant diagnostics from this hospitalization (including imaging, microbiology, ancillary and laboratory) are listed below for reference.     Microbiology: Recent Results (from the past 240 hour(s))  Blood Culture (routine x 2)     Status: None (Preliminary result)   Collection Time: 03/20/17  1:36 PM  Result Value Ref Range Status   Specimen Description LEFT ANTECUBITAL  Final   Special Requests   Final    BOTTLES DRAWN AEROBIC AND ANAEROBIC Blood Culture adequate volume   Culture NO GROWTH 2 DAYS  Final   Report Status PENDING  Incomplete  Blood Culture (routine x 2)     Status: None (Preliminary result)   Collection Time: 03/20/17  1:45 PM  Result Value Ref Range Status  Specimen Description BLOOD RIGHT HAND  Final   Special Requests   Final    BOTTLES DRAWN AEROBIC AND ANAEROBIC Blood Culture results may not be optimal due to an inadequate volume of blood received in culture bottles   Culture NO GROWTH 2 DAYS  Final   Report Status PENDING  Incomplete  Urine culture     Status: Abnormal   Collection Time: 03/20/17  2:50 PM  Result Value Ref Range Status   Specimen Description URINE, CLEAN CATCH  Final   Special Requests Normal  Final   Culture MULTIPLE SPECIES PRESENT, SUGGEST RECOLLECTION (A)  Final   Report Status 03/22/2017 FINAL  Final     Labs: Basic Metabolic Panel:  Recent Labs Lab 03/20/17 1335 03/21/17 0638 03/22/17 0648  NA 140 140 141  K 4.6 4.2 3.7  CL 105 107 108  CO2 25 26 25   GLUCOSE 142* 101* 113*  BUN 29* 23* 18  CREATININE 2.14* 1.82* 1.69*  CALCIUM 9.5 8.9 8.9   Liver Function Tests:  Recent Labs Lab 03/20/17 1335  AST 25  ALT 18  ALKPHOS 63  BILITOT 1.0  PROT 6.9  ALBUMIN 3.6   CBC:  Recent Labs Lab 03/20/17 1335 03/21/17 0638 03/22/17 0648  WBC 16.1* 12.6* 8.8  NEUTROABS 13.7* 10.0* 6.3  HGB 15.0 14.2 13.7  HCT 45.8 43.7 42.0  MCV 97.0 97.1 96.3  PLT 214 211 204     CBG:  Recent Labs Lab 03/21/17 1102 03/21/17 1604 03/21/17 1640 03/21/17 2149 03/22/17 0746  GLUCAP 117* 121* 138* 163* 103*    SIGNED: Time coordinating discharge: 60 minutes  Faye Ramsay, MD  [facility] 03/22/2017, 10:36 AM Pager [phone]  If 7PM-7AM, please contact night-coverage [email or web address] Password [email or web address]

## 2017-03-22 NOTE — Discharge Instructions (Signed)

## 2017-03-25 LAB — CULTURE, BLOOD (ROUTINE X 2)
Culture: NO GROWTH
Culture: NO GROWTH
SPECIAL REQUESTS: ADEQUATE

## 2017-05-07 DIAGNOSIS — E860 Dehydration: Secondary | ICD-10-CM | POA: Insufficient documentation

## 2017-05-07 DIAGNOSIS — R531 Weakness: Secondary | ICD-10-CM | POA: Insufficient documentation

## 2017-09-20 DIAGNOSIS — J45909 Unspecified asthma, uncomplicated: Secondary | ICD-10-CM | POA: Insufficient documentation

## 2017-09-20 DIAGNOSIS — M5430 Sciatica, unspecified side: Secondary | ICD-10-CM | POA: Insufficient documentation

## 2017-09-20 DIAGNOSIS — J309 Allergic rhinitis, unspecified: Secondary | ICD-10-CM | POA: Insufficient documentation

## 2017-09-20 DIAGNOSIS — K449 Diaphragmatic hernia without obstruction or gangrene: Secondary | ICD-10-CM | POA: Insufficient documentation

## 2017-09-20 DIAGNOSIS — M159 Polyosteoarthritis, unspecified: Secondary | ICD-10-CM | POA: Insufficient documentation

## 2017-09-20 DIAGNOSIS — R768 Other specified abnormal immunological findings in serum: Secondary | ICD-10-CM | POA: Insufficient documentation

## 2017-09-20 DIAGNOSIS — G2581 Restless legs syndrome: Secondary | ICD-10-CM | POA: Insufficient documentation

## 2017-09-20 DIAGNOSIS — E1149 Type 2 diabetes mellitus with other diabetic neurological complication: Secondary | ICD-10-CM | POA: Insufficient documentation

## 2017-09-20 DIAGNOSIS — K219 Gastro-esophageal reflux disease without esophagitis: Secondary | ICD-10-CM | POA: Insufficient documentation

## 2017-09-20 DIAGNOSIS — G5600 Carpal tunnel syndrome, unspecified upper limb: Secondary | ICD-10-CM | POA: Insufficient documentation

## 2017-10-03 DIAGNOSIS — R002 Palpitations: Secondary | ICD-10-CM | POA: Insufficient documentation

## 2017-12-26 ENCOUNTER — Encounter (HOSPITAL_COMMUNITY): Payer: Self-pay | Admitting: Emergency Medicine

## 2017-12-26 ENCOUNTER — Observation Stay (HOSPITAL_COMMUNITY): Payer: Medicare Other

## 2017-12-26 ENCOUNTER — Other Ambulatory Visit: Payer: Self-pay

## 2017-12-26 ENCOUNTER — Emergency Department (HOSPITAL_COMMUNITY): Payer: Medicare Other

## 2017-12-26 ENCOUNTER — Other Ambulatory Visit (HOSPITAL_COMMUNITY): Payer: Self-pay | Admitting: *Deleted

## 2017-12-26 ENCOUNTER — Inpatient Hospital Stay (HOSPITAL_COMMUNITY)
Admission: EM | Admit: 2017-12-26 | Discharge: 2017-12-31 | DRG: 280 | Disposition: A | Discharge status: Home Health | Payer: Medicare Other | Attending: Cardiology | Admitting: Cardiology

## 2017-12-26 ENCOUNTER — Other Ambulatory Visit (HOSPITAL_COMMUNITY): Payer: Medicare Other

## 2017-12-26 DIAGNOSIS — I5021 Acute systolic (congestive) heart failure: Secondary | ICD-10-CM | POA: Diagnosis not present

## 2017-12-26 DIAGNOSIS — E1122 Type 2 diabetes mellitus with diabetic chronic kidney disease: Secondary | ICD-10-CM | POA: Diagnosis present

## 2017-12-26 DIAGNOSIS — E1140 Type 2 diabetes mellitus with diabetic neuropathy, unspecified: Secondary | ICD-10-CM | POA: Diagnosis present

## 2017-12-26 DIAGNOSIS — I130 Hypertensive heart and chronic kidney disease with heart failure and stage 1 through stage 4 chronic kidney disease, or unspecified chronic kidney disease: Secondary | ICD-10-CM | POA: Diagnosis present

## 2017-12-26 DIAGNOSIS — R059 Cough, unspecified: Secondary | ICD-10-CM

## 2017-12-26 DIAGNOSIS — I2510 Atherosclerotic heart disease of native coronary artery without angina pectoris: Secondary | ICD-10-CM | POA: Diagnosis present

## 2017-12-26 DIAGNOSIS — M5136 Other intervertebral disc degeneration, lumbar region: Secondary | ICD-10-CM | POA: Diagnosis present

## 2017-12-26 DIAGNOSIS — Z800 Family history of malignant neoplasm of digestive organs: Secondary | ICD-10-CM | POA: Diagnosis not present

## 2017-12-26 DIAGNOSIS — I471 Supraventricular tachycardia: Secondary | ICD-10-CM | POA: Diagnosis present

## 2017-12-26 DIAGNOSIS — I509 Heart failure, unspecified: Secondary | ICD-10-CM

## 2017-12-26 DIAGNOSIS — I25810 Atherosclerosis of coronary artery bypass graft(s) without angina pectoris: Secondary | ICD-10-CM | POA: Diagnosis present

## 2017-12-26 DIAGNOSIS — Z9049 Acquired absence of other specified parts of digestive tract: Secondary | ICD-10-CM

## 2017-12-26 DIAGNOSIS — E890 Postprocedural hypothyroidism: Secondary | ICD-10-CM | POA: Diagnosis present

## 2017-12-26 DIAGNOSIS — J449 Chronic obstructive pulmonary disease, unspecified: Secondary | ICD-10-CM | POA: Diagnosis present

## 2017-12-26 DIAGNOSIS — Z8249 Family history of ischemic heart disease and other diseases of the circulatory system: Secondary | ICD-10-CM | POA: Diagnosis not present

## 2017-12-26 DIAGNOSIS — N183 Chronic kidney disease, stage 3 unspecified: Secondary | ICD-10-CM | POA: Diagnosis present

## 2017-12-26 DIAGNOSIS — I4891 Unspecified atrial fibrillation: Secondary | ICD-10-CM | POA: Diagnosis not present

## 2017-12-26 DIAGNOSIS — I5023 Acute on chronic systolic (congestive) heart failure: Secondary | ICD-10-CM | POA: Diagnosis present

## 2017-12-26 DIAGNOSIS — G2581 Restless legs syndrome: Secondary | ICD-10-CM | POA: Diagnosis present

## 2017-12-26 DIAGNOSIS — Z79890 Hormone replacement therapy (postmenopausal): Secondary | ICD-10-CM

## 2017-12-26 DIAGNOSIS — E785 Hyperlipidemia, unspecified: Secondary | ICD-10-CM | POA: Diagnosis present

## 2017-12-26 DIAGNOSIS — Z90710 Acquired absence of both cervix and uterus: Secondary | ICD-10-CM

## 2017-12-26 DIAGNOSIS — I214 Non-ST elevation (NSTEMI) myocardial infarction: Secondary | ICD-10-CM | POA: Diagnosis present

## 2017-12-26 DIAGNOSIS — Z85038 Personal history of other malignant neoplasm of large intestine: Secondary | ICD-10-CM | POA: Diagnosis not present

## 2017-12-26 DIAGNOSIS — Z794 Long term (current) use of insulin: Secondary | ICD-10-CM | POA: Diagnosis not present

## 2017-12-26 DIAGNOSIS — Z803 Family history of malignant neoplasm of breast: Secondary | ICD-10-CM | POA: Diagnosis not present

## 2017-12-26 DIAGNOSIS — I480 Paroxysmal atrial fibrillation: Secondary | ICD-10-CM | POA: Diagnosis present

## 2017-12-26 DIAGNOSIS — Z87891 Personal history of nicotine dependence: Secondary | ICD-10-CM

## 2017-12-26 DIAGNOSIS — N184 Chronic kidney disease, stage 4 (severe): Secondary | ICD-10-CM | POA: Diagnosis present

## 2017-12-26 DIAGNOSIS — R0602 Shortness of breath: Secondary | ICD-10-CM | POA: Diagnosis present

## 2017-12-26 DIAGNOSIS — E876 Hypokalemia: Secondary | ICD-10-CM | POA: Diagnosis not present

## 2017-12-26 DIAGNOSIS — J9601 Acute respiratory failure with hypoxia: Secondary | ICD-10-CM | POA: Diagnosis not present

## 2017-12-26 DIAGNOSIS — E0590 Thyrotoxicosis, unspecified without thyrotoxic crisis or storm: Secondary | ICD-10-CM | POA: Diagnosis not present

## 2017-12-26 DIAGNOSIS — M069 Rheumatoid arthritis, unspecified: Secondary | ICD-10-CM | POA: Diagnosis present

## 2017-12-26 DIAGNOSIS — K219 Gastro-esophageal reflux disease without esophagitis: Secondary | ICD-10-CM | POA: Diagnosis present

## 2017-12-26 DIAGNOSIS — N289 Disorder of kidney and ureter, unspecified: Secondary | ICD-10-CM

## 2017-12-26 DIAGNOSIS — Z79899 Other long term (current) drug therapy: Secondary | ICD-10-CM

## 2017-12-26 DIAGNOSIS — Z7984 Long term (current) use of oral hypoglycemic drugs: Secondary | ICD-10-CM

## 2017-12-26 DIAGNOSIS — J441 Chronic obstructive pulmonary disease with (acute) exacerbation: Secondary | ICD-10-CM

## 2017-12-26 DIAGNOSIS — J9600 Acute respiratory failure, unspecified whether with hypoxia or hypercapnia: Secondary | ICD-10-CM | POA: Diagnosis present

## 2017-12-26 DIAGNOSIS — R05 Cough: Secondary | ICD-10-CM

## 2017-12-26 HISTORY — DX: Gastro-esophageal reflux disease without esophagitis: K21.9

## 2017-12-26 HISTORY — DX: Restless legs syndrome: G25.81

## 2017-12-26 HISTORY — DX: Chronic obstructive pulmonary disease, unspecified: J44.9

## 2017-12-26 HISTORY — DX: Other intervertebral disc degeneration, lumbar region without mention of lumbar back pain or lower extremity pain: M51.369

## 2017-12-26 HISTORY — DX: Type 2 diabetes mellitus with diabetic neuropathy, unspecified: E11.40

## 2017-12-26 HISTORY — DX: Gout, unspecified: M10.9

## 2017-12-26 HISTORY — DX: Other intervertebral disc degeneration, lumbar region: M51.36

## 2017-12-26 HISTORY — DX: Heart failure, unspecified: I50.9

## 2017-12-26 LAB — TROPONIN I
Troponin I: 1.29 ng/mL (ref <0.03)
Troponin I: 0.86 ng/mL (ref <0.03)

## 2017-12-26 LAB — HEPARIN LEVEL (UNFRACTIONATED): Heparin Unfractionated: 0.57 IU/mL (ref 0.30–0.70)

## 2017-12-26 LAB — CBC WITH DIFFERENTIAL/PLATELET
BASOS PCT: 0 %
Basophils Absolute: 0 10*3/uL (ref 0.0–0.1)
Eosinophils Absolute: 0.3 10*3/uL (ref 0.0–0.7)
Eosinophils Relative: 3 %
HEMATOCRIT: 39.8 % (ref 36.0–46.0)
HEMOGLOBIN: 12.6 g/dL (ref 12.0–15.0)
LYMPHS ABS: 0.9 10*3/uL (ref 0.7–4.0)
Lymphocytes Relative: 9 %
MCH: 32.1 pg (ref 26.0–34.0)
MCHC: 31.7 g/dL (ref 30.0–36.0)
MCV: 101.3 fL — ABNORMAL HIGH (ref 78.0–100.0)
MONOS PCT: 9 %
Monocytes Absolute: 0.9 10*3/uL (ref 0.1–1.0)
NEUTROS ABS: 7.9 10*3/uL — AB (ref 1.7–7.7)
Neutrophils Relative %: 79 %
Platelets: 225 10*3/uL (ref 150–400)
RBC: 3.93 MIL/uL (ref 3.87–5.11)
RDW: 12.6 % (ref 11.5–15.5)
WBC: 10 10*3/uL (ref 4.0–10.5)

## 2017-12-26 LAB — BRAIN NATRIURETIC PEPTIDE: B Natriuretic Peptide: 1003 pg/mL — ABNORMAL HIGH (ref 0.0–100.0)

## 2017-12-26 LAB — BASIC METABOLIC PANEL
ANION GAP: 9 (ref 5–15)
BUN: 22 mg/dL — AB (ref 6–20)
CHLORIDE: 104 mmol/L (ref 101–111)
CO2: 26 mmol/L (ref 22–32)
Calcium: 9.1 mg/dL (ref 8.9–10.3)
Creatinine, Ser: 1.25 mg/dL — ABNORMAL HIGH (ref 0.44–1.00)
GFR calc Af Amer: 44 mL/min — ABNORMAL LOW (ref >60)
GFR calc non Af Amer: 38 mL/min — ABNORMAL LOW (ref >60)
GLUCOSE: 104 mg/dL — AB (ref 65–99)
Potassium: 4.3 mmol/L (ref 3.5–5.1)
Sodium: 139 mmol/L (ref 135–145)

## 2017-12-26 LAB — MRSA PCR SCREENING: MRSA by PCR: NEGATIVE

## 2017-12-26 LAB — GLUCOSE, CAPILLARY
GLUCOSE-CAPILLARY: 261 mg/dL — AB (ref 65–99)
GLUCOSE-CAPILLARY: 311 mg/dL — AB (ref 65–99)

## 2017-12-26 LAB — TSH: TSH: 0.041 u[IU]/mL — AB (ref 0.350–4.500)

## 2017-12-26 LAB — T4, FREE: Free T4: 2.32 ng/dL — ABNORMAL HIGH (ref 0.82–1.77)

## 2017-12-26 LAB — MAGNESIUM: Magnesium: 1.8 mg/dL (ref 1.7–2.4)

## 2017-12-26 MED ORDER — ALBUTEROL (5 MG/ML) CONTINUOUS INHALATION SOLN
10.0000 mg/h | INHALATION_SOLUTION | Freq: Once | RESPIRATORY_TRACT | Status: AC
  Administered 2017-12-26: 10 mg/h via RESPIRATORY_TRACT

## 2017-12-26 MED ORDER — HEPARIN (PORCINE) IN NACL 100-0.45 UNIT/ML-% IJ SOLN
900.0000 [IU]/h | INTRAMUSCULAR | Status: DC
  Administered 2017-12-26 – 2017-12-28 (×4): 900 [IU]/h via INTRAVENOUS
  Filled 2017-12-26 (×3): qty 250

## 2017-12-26 MED ORDER — IPRATROPIUM BROMIDE 0.02 % IN SOLN
1.0000 mg | Freq: Once | RESPIRATORY_TRACT | Status: AC
  Administered 2017-12-26: 1 mg via RESPIRATORY_TRACT
  Filled 2017-12-26: qty 5

## 2017-12-26 MED ORDER — AMIODARONE IV BOLUS ONLY 150 MG/100ML
INTRAVENOUS | Status: AC
  Administered 2017-12-26: 150 mg
  Filled 2017-12-26: qty 100

## 2017-12-26 MED ORDER — AMIODARONE HCL IN DEXTROSE 360-4.14 MG/200ML-% IV SOLN
60.0000 mg/h | INTRAVENOUS | Status: DC
  Administered 2017-12-26: 60 mg/h via INTRAVENOUS

## 2017-12-26 MED ORDER — ASPIRIN EC 81 MG PO TBEC
81.0000 mg | DELAYED_RELEASE_TABLET | Freq: Every day | ORAL | Status: DC
  Administered 2017-12-27 – 2017-12-28 (×2): 81 mg via ORAL
  Filled 2017-12-26 (×2): qty 1

## 2017-12-26 MED ORDER — HEPARIN BOLUS VIA INFUSION
4000.0000 [IU] | Freq: Once | INTRAVENOUS | Status: AC
  Administered 2017-12-26: 4000 [IU] via INTRAVENOUS

## 2017-12-26 MED ORDER — FUROSEMIDE 10 MG/ML IJ SOLN
40.0000 mg | Freq: Once | INTRAMUSCULAR | Status: AC
  Administered 2017-12-26: 40 mg via INTRAVENOUS
  Filled 2017-12-26: qty 4

## 2017-12-26 MED ORDER — HYDROCODONE-ACETAMINOPHEN 5-325 MG PO TABS
1.0000 | ORAL_TABLET | Freq: Once | ORAL | Status: AC
  Administered 2017-12-26: 1 via ORAL

## 2017-12-26 MED ORDER — HYDROCODONE-ACETAMINOPHEN 10-325 MG PO TABS
1.0000 | ORAL_TABLET | Freq: Four times a day (QID) | ORAL | Status: DC | PRN
  Administered 2017-12-26 – 2017-12-30 (×7): 1 via ORAL
  Filled 2017-12-26 (×7): qty 1

## 2017-12-26 MED ORDER — ASPIRIN 81 MG PO CHEW
324.0000 mg | CHEWABLE_TABLET | Freq: Once | ORAL | Status: AC
  Administered 2017-12-26: 324 mg via ORAL
  Filled 2017-12-26: qty 4

## 2017-12-26 MED ORDER — SODIUM CHLORIDE 0.9 % IV SOLN
INTRAVENOUS | Status: DC
  Administered 2017-12-26: 19:00:00 via INTRAVENOUS

## 2017-12-26 MED ORDER — AMIODARONE HCL IN DEXTROSE 360-4.14 MG/200ML-% IV SOLN
30.0000 mg/h | INTRAVENOUS | Status: DC
  Administered 2017-12-26 – 2017-12-27 (×2): 30 mg/h via INTRAVENOUS
  Filled 2017-12-26 (×2): qty 200

## 2017-12-26 MED ORDER — ASPIRIN 81 MG PO CHEW: 324.0000 mg | CHEWABLE_TABLET | ORAL | Status: DC

## 2017-12-26 MED ORDER — HYDROCODONE-ACETAMINOPHEN 5-325 MG PO TABS
ORAL_TABLET | ORAL | Status: AC
  Filled 2017-12-26: qty 1

## 2017-12-26 MED ORDER — ONDANSETRON HCL 4 MG/2ML IJ SOLN
4.0000 mg | Freq: Four times a day (QID) | INTRAMUSCULAR | Status: DC | PRN
  Administered 2017-12-26: 4 mg via INTRAVENOUS
  Filled 2017-12-26: qty 2

## 2017-12-26 MED ORDER — ATORVASTATIN CALCIUM 80 MG PO TABS
80.0000 mg | ORAL_TABLET | Freq: Every day | ORAL | Status: DC
  Administered 2017-12-26 – 2017-12-30 (×5): 80 mg via ORAL
  Filled 2017-12-26 (×5): qty 1

## 2017-12-26 MED ORDER — NITROGLYCERIN 0.4 MG SL SUBL: 0.4000 mg | SUBLINGUAL_TABLET | SUBLINGUAL | Status: DC | PRN

## 2017-12-26 MED ORDER — SODIUM CHLORIDE 0.9 % IV BOLUS: 250.0000 mL | Freq: Once | INTRAVENOUS | Status: DC

## 2017-12-26 MED ORDER — GABAPENTIN 300 MG PO CAPS
300.0000 mg | ORAL_CAPSULE | Freq: Three times a day (TID) | ORAL | Status: DC
  Administered 2017-12-26 – 2017-12-31 (×15): 300 mg via ORAL
  Filled 2017-12-26 (×15): qty 1

## 2017-12-26 MED ORDER — METHYLPREDNISOLONE SODIUM SUCC 125 MG IJ SOLR
125.0000 mg | Freq: Once | INTRAMUSCULAR | Status: AC
  Administered 2017-12-26: 125 mg via INTRAVENOUS
  Filled 2017-12-26: qty 2

## 2017-12-26 MED ORDER — AMIODARONE HCL IN DEXTROSE 360-4.14 MG/200ML-% IV SOLN
INTRAVENOUS | Status: AC
  Filled 2017-12-26: qty 200

## 2017-12-26 MED ORDER — ACETAMINOPHEN 325 MG PO TABS: 650.0000 mg | ORAL_TABLET | ORAL | Status: DC | PRN

## 2017-12-26 MED ORDER — HYDROXYCHLOROQUINE SULFATE 200 MG PO TABS
400.0000 mg | ORAL_TABLET | Freq: Every day | ORAL | Status: DC
  Administered 2017-12-26 – 2017-12-31 (×6): 400 mg via ORAL
  Filled 2017-12-26 (×6): qty 2

## 2017-12-26 MED ORDER — IPRATROPIUM-ALBUTEROL 0.5-2.5 (3) MG/3ML IN SOLN
3.0000 mL | Freq: Once | RESPIRATORY_TRACT | Status: AC
  Administered 2017-12-26: 3 mL via RESPIRATORY_TRACT
  Filled 2017-12-26: qty 3

## 2017-12-26 MED ORDER — AMIODARONE LOAD VIA INFUSION
150.0000 mg | Freq: Once | INTRAVENOUS | Status: DC
  Filled 2017-12-26: qty 83.34

## 2017-12-26 MED ORDER — IPRATROPIUM-ALBUTEROL 0.5-2.5 (3) MG/3ML IN SOLN: 3.0000 mL | Freq: Once | RESPIRATORY_TRACT | Status: DC

## 2017-12-26 MED ORDER — ALBUTEROL SULFATE (2.5 MG/3ML) 0.083% IN NEBU: 2.5000 mg | INHALATION_SOLUTION | Freq: Once | RESPIRATORY_TRACT | Status: DC

## 2017-12-26 MED ORDER — ZOLPIDEM TARTRATE 5 MG PO TABS
5.0000 mg | ORAL_TABLET | Freq: Every evening | ORAL | Status: DC | PRN
  Administered 2017-12-28: 5 mg via ORAL
  Filled 2017-12-26: qty 1

## 2017-12-26 MED ORDER — ALBUTEROL (5 MG/ML) CONTINUOUS INHALATION SOLN
10.0000 mg/h | INHALATION_SOLUTION | Freq: Once | RESPIRATORY_TRACT | Status: DC
  Filled 2017-12-26: qty 20

## 2017-12-26 MED ORDER — ASPIRIN 300 MG RE SUPP: 300.0000 mg | RECTAL | Status: DC

## 2017-12-26 MED ORDER — IOPAMIDOL (ISOVUE-370) INJECTION 76%
80.0000 mL | Freq: Once | INTRAVENOUS | Status: AC | PRN
  Administered 2017-12-26: 80 mL via INTRAVENOUS

## 2017-12-26 NOTE — ED Provider Notes (Signed)
[facility] EMERGENCY DEPARTMENT Provider Note   CSN: [id number] Arrival date & time: 12/26/17  3151     History   Chief Complaint Chief Complaint  Patient presents with  . Shortness of Breath    HPI Debra [name] is a 84 y.o. female.   Shortness of Breath     Pt was seen at 0745.  Per pt, family and EMS:  c/o gradual onset and worsening of persistent cough and SOB for the past 3 days. Describes the cough as productive of "green" sputum. States she "becomes anxious when she gets coughing." Endorses hx of COPD and CHF. Has been using home MDI without relief. Pt does not have home O2. EMS states pt's O2 was "89% R/A" on their arrival to scene.  Denies CP/palpitations, no back pain, no abd pain, no N/V/D, no fevers, no rash, no calf/LE pain or unilateral swelling.    Past Medical History:  Diagnosis Date  . Arthritis   . Cancer [facility])    colon cancer  . CHF (congestive heart failure) ([place])   . CKD (chronic kidney disease)   . COPD (chronic obstructive pulmonary disease) ([place])   . Coronary artery disease   . DDD (degenerative disc disease), lumbar   . Diabetes mellitus without complication ([place])   . Diabetic neuropathy ([place])   . GERD (gastroesophageal reflux disease)   . Gout   . Hypertension   . Renal disorder    Pt has only one functioning kidney   . Restless leg syndrome     Patient Active Problem List   Diagnosis Date Noted  . UTI (urinary tract infection) 03/20/2017  . Hypertension 03/20/2017  . AKI (acute kidney injury) ([place]) 03/20/2017  . Chronic renal insufficiency, stage 3 (moderate) ([place]) 03/20/2017  . Rheumatoid arthritis ([place]) 03/20/2017  . Type 2 diabetes mellitus ([place]) 03/20/2017    Past Surgical History:  Procedure Laterality Date  . ABDOMINAL HYSTERECTOMY    . CHOLECYSTECTOMY    . COLON RESECTION    . CORONARY ARTERY BYPASS GRAFT    . ILIAC ARTERY STENT    . LUNG SURGERY Left 2012   left lung resection  . RENAL ARTERY STENT    . THYROID  SURGERY       OB History   None      Home Medications    Prior to Admission medications   Medication Sig Start Date End Date Taking? Authorizing Provider  amLODipine (NORVASC) 10 MG tablet Take 10 mg by mouth daily.    [provider]  Cholecalciferol (VITAMIN D3) 5000 units CAPS Take 1 capsule by mouth daily.    [provider]  gabapentin (NEURONTIN) 300 MG capsule Take 300 mg by mouth 3 (three) times daily.    [provider]  HYDROcodone-acetaminophen (NORCO) 10-325 MG tablet Take 1 tablet by mouth every 6 (six) hours as needed for moderate pain or severe pain.    [provider]  hydroxychloroquine (PLAQUENIL) 200 MG tablet Take 400 mg by mouth daily.    [provider]  insulin glargine (LANTUS) 100 unit/mL SOPN Inject 26 Units into the skin at bedtime.    [provider]  metoprolol succinate (TOPROL-XL) 25 MG 24 hr tablet Take 25 mg by mouth daily.    [provider]  mirtazapine (REMERON) 15 MG tablet Take 15 mg by mouth at bedtime.    [provider]  omega-3 fish oil (MAXEPA) 1000 MG CAPS capsule Take 1 capsule by mouth daily. 600mg   epa/400mg  dha plus vitamin e 5iu    [provider]  ranitidine (ZANTAC) 150 MG tablet Take 150 mg by mouth 2 (two) times daily.    [provider]    Family History Family History  Problem Relation Age of Onset  . Colon cancer Mother   . Heart disease Father   . Breast cancer Sister     Social History Social History   Tobacco Use  . Smoking status: Never Smoker  . Smokeless tobacco: Never Used  Substance Use Topics  . Alcohol use: No  . Drug use: No     Allergies   Ciprofloxacin   Review of Systems Review of Systems  Respiratory: Positive for shortness of breath.   ROS: Statement: All systems negative except as marked or noted in the HPI; Constitutional: Negative for fever and chills. ; ; Eyes: Negative for eye pain, redness and  discharge. ; ; ENMT: Negative for ear pain, hoarseness, nasal congestion, sinus pressure and sore throat. ; ; Cardiovascular: Negative for chest pain, palpitations, diaphoresis, and peripheral edema. ; ; Respiratory: +cough, SOB. Negative for wheezing and stridor. ; ; Gastrointestinal: Negative for nausea, vomiting, diarrhea, abdominal pain, blood in stool, hematemesis, jaundice and rectal bleeding. . ; ; Genitourinary: Negative for dysuria, flank pain and hematuria. ; ; Musculoskeletal: Negative for back pain and neck pain. Negative for swelling and trauma.; ; Skin: Negative for pruritus, rash, abrasions, blisters, bruising and skin lesion.; ; Neuro: Negative for headache, lightheadedness and neck stiffness. Negative for weakness, altered level of consciousness, altered mental status, extremity weakness, paresthesias, involuntary movement, seizure and syncope.       Physical Exam Updated Vital Signs BP (!) 157/49 (BP Location: Right Arm)   Pulse 75   Temp 98 F (36.7 C) (Oral)   Resp 20   Ht 5\' 7"  (1.702 m)   Wt 74.8 kg (165 lb)   SpO2 96%   BMI 25.84 kg/m    Patient Vitals for the past 24 hrs:  BP Temp Temp src Pulse Resp SpO2 Height Weight  12/26/17 1035 103/63 - - 78 20 99 % - -  12/26/17 0932 - - - - - 100 % - -  12/26/17 0831 - - - - - 100 % - -  12/26/17 0830 (!) 142/73 - - 64 16 100 % - -  12/26/17 0800 (!) 151/55 - - (!) 59 18 93 % - -  12/26/17 0730 138/87 - - (!) 45 14 96 % - -  12/26/17 0718 (!) 157/49 98 F (36.7 C) Oral 75 20 96 % - -  12/26/17 0715 - - - - - - 5\' 7"  (1.702 m) 74.8 kg (165 lb)     Physical Exam 0750: Physical examination:  Nursing notes reviewed; Vital signs and O2 SAT reviewed;  Constitutional: Well developed, Well nourished, Well hydrated, Mildly uncomfortable appearing.; Head:  Normocephalic, atraumatic; Eyes: EOMI, PERRL, No scleral icterus; ENMT: Mouth and pharynx normal, Mucous membranes moist; Neck: Supple, Full range of motion, No  lymphadenopathy; Cardiovascular: Regular rate and rhythm, No gallop; Respiratory: Breath sounds diminished & equal bilaterally, faint wheezes. No audible wheezing. +intermittent moist cough that pt feels nauseated during. Speaking full sentences, sitting upright. Normal respiratory effort/excursion; Chest: Nontender, Movement normal; Abdomen: Soft, Nontender, Nondistended, Normal bowel sounds; Genitourinary: No CVA tenderness; Extremities: Peripheral pulses normal, No tenderness, No edema, No calf edema or asymmetry.; Neuro: AA&Ox3, Major CN grossly intact.  Speech clear. No gross focal motor or sensory deficits in extremities.;  Skin: Color normal, Warm, Dry.   ED Treatments / Results  Labs (all labs ordered are listed, but only abnormal results are displayed)   EKG EKG Interpretation  Date/Time:  Monday December 26 2017 07:18:55 EDT Ventricular Rate:  69 PR Interval:    QRS Duration: 94 QT Interval:  396 QTC Calculation: 425 R Axis:   90 Text Interpretation:  Sinus rhythm Atrial premature complexes Probable left atrial enlargement Borderline right axis deviation Artifact When compared with ECG of 03/20/2017 No significant change was found Confirmed by Francine Graven [phone]) on 12/26/2017 7:52:27 AM   EKG Interpretation  Date/Time:  Monday December 26 2017 10:55:38 EDT Ventricular Rate:  125 PR Interval:    QRS Duration: 103 QT Interval:  329 QTC Calculation: 475 R Axis:   108 Text Interpretation:  Sinus tachycardia Ventricular premature complex Aberrant conduction of SV complex(es) Right axis deviation Repolarization abnormality, prob rate related supraventricular complexes and  Nonspecific ST and T wave abnormality are new since previous tracing today Confirmed by Francine Graven [phone]) on 12/26/2017 11:04:53 AM         Radiology   Procedures Procedures (including critical care time)  Medications Ordered in ED Medications  ipratropium-albuterol (DUONEB) 0.5-2.5 (3) MG/3ML  nebulizer solution 3 mL (has no administration in time range)  ipratropium (ATROVENT) nebulizer solution 1 mg (has no administration in time range)  methylPREDNISolone sodium succinate (SOLU-MEDROL) 125 mg/2 mL injection 125 mg (125 mg Intravenous Given 12/26/17 0754)     Initial Impression / Assessment and Plan / ED Course  I have reviewed the triage vital signs and the nursing notes.  Pertinent labs & imaging results that were available during my care of the patient were reviewed by me and considered in my medical decision making (see chart for details).  MDM Reviewed: previous chart, nursing note and vitals Reviewed previous: labs and ECG Interpretation: labs, ECG and x-ray Total time providing critical care: 30-74 minutes. This excludes time spent performing separately reportable procedures and services.   CRITICAL CARE Performed by: Alfonzo Feller Total critical care time: 45 minutes Critical care time was exclusive of separately billable procedures and treating other patients. Critical care was necessary to treat or prevent imminent or life-threatening deterioration. Critical care was time spent personally by me on the following activities: development of treatment plan with patient and/or surrogate as well as nursing, discussions with consultants, evaluation of patient's response to treatment, examination of patient, obtaining history from patient or surrogate, ordering and performing treatments and interventions, ordering and review of laboratory studies, ordering and review of radiographic studies, pulse oximetry and re-evaluation of patient's condition.   Results for orders placed or performed during the hospital encounter of 95/09/32  Basic metabolic panel  Result Value Ref Range   Sodium 139 135 - 145 mmol/L   Potassium 4.3 3.5 - 5.1 mmol/L   Chloride 104 101 - 111 mmol/L   CO2 26 22 - 32 mmol/L   Glucose, Bld 104 (H) 65 - 99 mg/dL   BUN 22 (H) 6 - 20 mg/dL    Creatinine, Ser 1.25 (H) 0.44 - 1.00 mg/dL   Calcium 9.1 8.9 - 10.3 mg/dL   GFR calc non Af Amer 38 (L) >60 mL/min   GFR calc Af Amer 44 (L) >60 mL/min   Anion gap 9 5 - 15  Brain natriuretic peptide  Result Value Ref Range   B Natriuretic Peptide 1,003.0 (H) 0.0 - 100.0 pg/mL  Troponin I  Result Value Ref Range  Troponin I 1.29 (HH) <0.03 ng/mL  CBC with Differential  Result Value Ref Range   WBC 10.0 4.0 - 10.5 K/uL   RBC 3.93 3.87 - 5.11 MIL/uL   Hemoglobin 12.6 12.0 - 15.0 g/dL   HCT 39.8 36.0 - 46.0 %   MCV 101.3 (H) 78.0 - 100.0 fL   MCH 32.1 26.0 - 34.0 pg   MCHC 31.7 30.0 - 36.0 g/dL   RDW 12.6 11.5 - 15.5 %   Platelets 225 150 - 400 K/uL   Neutrophils Relative % 79 %   Neutro Abs 7.9 (H) 1.7 - 7.7 K/uL   Lymphocytes Relative 9 %   Lymphs Abs 0.9 0.7 - 4.0 K/uL   Monocytes Relative 9 %   Monocytes Absolute 0.9 0.1 - 1.0 K/uL   Eosinophils Relative 3 %   Eosinophils Absolute 0.3 0.0 - 0.7 K/uL   Basophils Relative 0 %   Basophils Absolute 0.0 0.0 - 0.1 K/uL    Dg Chest 2 View Result Date: 12/26/2017 CLINICAL DATA:  Productive cough.  Shortness of breath. EXAM: CHEST - 2 VIEW COMPARISON:  March 20, 2017 FINDINGS: No pneumothorax. Persistent surgical changes in the right chest with associated scarring. No definitive infiltrate. The cardiomediastinal silhouette is stable. Probable tiny right pleural effusion versus pleural thickening seen on the lateral view. IMPRESSION: 1. Stable postsurgical changes in the right upper chest with scarring. No convincing infiltrate. Pleural thickening versus a tiny right pleural effusion. Electronically Signed   By: Dorise Bullion III M.D   On: 12/26/2017 08:19    1025:  Pt's lungs very diminished on initial exam; short neb given with pt feeling "a little better." Lungs moving more air, resps without distress and cough continued; hour long neb and IV solumedrol given. After neb: lungs CTA bilat, speaking full sentences, Sats improved  to 99-100% on O2 2L N/C. Pt stated her breathing was improved. Troponin elevated; ASA given. EKG unchanged from previous and pt denies CP.  BNP elevated; no old to compare, IV lasix given. CXR without acute CHF. Dx and testing d/w pt and family.  Questions answered.  Verb understanding, agreeable to admit. T/C returned from Cards Dr. Harl Bowie, case discussed, including:  HPI, pertinent PM/SHx, VS/PE, dx testing, ED course and treatment:  Agreeable to come to ED for evaluation for admission.   1055:  IV heparin and 2nd troponin ordered. Pt continues to cough and make herself gag/retch with coughing up thick sputum. Monitor with sinus tachycardia and intermittent runs of SVT vs afib. Pt denies CP/palpitations. Cards Dr. Harl Bowie at bedside. IV amiodarone bolus and gtt ordered.   1130:  Cards Dr. Harl Bowie has evaluated pt: requests to order CT-A chest to r/o PE before transfer/admit to [facility]. Monitor with intermittent afib, rates to 130's; IV amiodarone and IV heparin infusing.         Final Clinical Impressions(s) / ED Diagnoses   Final diagnoses:  None    ED Discharge Orders    None       Francine Graven, DO 12/30/17 1243

## 2017-12-26 NOTE — Progress Notes (Addendum)
Interval coverage note:  Patient transferred from [facility]. Reported had h/o CAD s/p CABG in 1997 at [facility] in [place]. Has not had another cath since. She was never told of any weakened heart. Her previous anginal symptom has already been dyspnea and fatigue, never had any chest pain even prior to CABG. She presented to [facility] with similar symptom. EKG showed NSR with SVT and short NSVT. TSH severely low at 0.041, per patient, she had thyroidectomy but was recently told it is possible that not all thyroid issue was taken out. Dr. Nelly Laurence note also mentioned PAF, although no EKG in Epic to support this. Ruled in for NSTEMI, trop trending down. ST depression in V5-V6.   Subjective: Still fatigued, dyspnea.   Objective: Heart: RRR, no edema Lung: clear, no crackles General: NAD, resting in bed MSK: normal, no abnormality Abdomen: soft, nontender  Plan:  1. Continue IV heparin. Waiting for Echo report. Proceed with cath tomorrow, earlier if any change overnight. Trend trop 2. TSH severely low, check T3 and T4 3. SVT and NSVT: may be contributed by cath ischemic heart and abnormal thyroid 4. Although Dr. Harl Bowie mentioned PAF, I could not locate the telemetry, all EKG so far showed sinus rhythm with PACs and SVT  Signed, Almyra Deforest PA Pager: [phone]

## 2017-12-26 NOTE — Progress Notes (Signed)
ANTICOAGULATION CONSULT NOTE - Follow Up Consult  Pharmacy Consult for Heparin Indication: chest pain/ACS  Allergies  Allergen Reactions  . Ciprofloxacin Rash and Itching  . Iodinated Diagnostic Agents Rash    Patient Measurements: Height: 5\' 7"  (170.2 cm) Weight: 163 lb 12.8 oz (74.3 kg) IBW/kg (Calculated) : 61.6 Heparin Dosing Weight: 74.3 kg  Vital Signs: Temp: 97.9 F (36.6 C) (06/24 1521) Temp Source: Oral (06/24 1521) BP: 94/60 (06/24 1800) Pulse Rate: 68 (06/24 1800)  Labs: Recent Labs    12/26/17 0845 12/26/17 1115 12/26/17 1801  HGB 12.6  --   --   HCT 39.8  --   --   PLT 225  --   --   HEPARINUNFRC  --   --  0.57  CREATININE 1.25*  --   --   TROPONINI 1.29* 0.86*  --     Estimated Creatinine Clearance: 34 mL/min (A) (by C-G formula based on SCr of 1.25 mg/dL (H)).   Medications:  Infusions:  . amiodarone 30 mg/hr (12/26/17 1804)  . heparin 900 Units/hr (12/26/17 1700)  . sodium chloride      Assessment: 84 year old female transferred from [facility] on IV heparin for ACS/NSTEMI with plan for Cath in AM.   Heparin level is therapeutic at 0.57 on 900 units/hr post bolus.  RN reports that an IV came out with a good amount of bleeding requiring 20 minutes of pressure but has now resolved. No other bleeding noted.   Goal of Therapy:  Heparin level 0.3-0.7 units/ml Monitor platelets by anticoagulation protocol: Yes   Plan:  Continue Heparin at 900 units/hr.  Recheck Heparin level in 6 hours to confirm not accumulating. Daily Heparin level and CBC.  Sloan Leiter, PharmD, BCPS, BCCCP Clinical Pharmacist Clinical phone 12/26/2017 until 10:30PM [phone] After hours, please call [phone] 12/26/2017,6:57 PM

## 2017-12-26 NOTE — H&P (Addendum)
Cardiology Admission History and Physical:   Patient ID: Debra [name]; MRN: [id number]; DOB: 1928/10/08   Admission date: 12/26/2017  Primary Care Provider: System, Provider Not In Primary Cardiologist: No primary care provider on file. Raymond-doesn't know his name Primary Electrophysiologist:  NA  Chief Complaint: short of breath  Patient Profile:   Debra [name] is a 84 y.o. female with a history of CABG 1997 admitted with CHF & NSTEMI  History of Present Illness:   Debra [name] is an 84 yo female visiting her daughter, from [facility] Had CABG [place], [place] and hasn't seen her cardiologist in a long time. Doing well until 3 days ago became progressively short of breath. Denies any angina or arrhythmias. Former smoker but quit '97.Asthma and uses inhalers. EKG NSR with PAC's no acute MI but troponin 1.29 and BNP over 1000. No history of Afib but going in/out SVT and 3 beats VT while I'm in room(after breathing treatment).   Past Medical History:  Diagnosis Date  . Arthritis   . Cancer [facility])    colon cancer  . CHF (congestive heart failure) ([place])   . CKD (chronic kidney disease)   . COPD (chronic obstructive pulmonary disease) ([place])   . Coronary artery disease   . DDD (degenerative disc disease), lumbar   . Diabetes mellitus without complication ([place])   . Diabetic neuropathy ([place])   . GERD (gastroesophageal reflux disease)   . Gout   . Hypertension   . Renal disorder    Pt has only one functioning kidney   . Restless leg syndrome     Past Surgical History:  Procedure Laterality Date  . ABDOMINAL HYSTERECTOMY    . CHOLECYSTECTOMY    . COLON RESECTION    . CORONARY ARTERY BYPASS GRAFT    . ILIAC ARTERY STENT    . LUNG SURGERY Left 2012   left lung resection  . RENAL ARTERY STENT    . THYROID SURGERY       Medications Prior to Admission: Prior to Admission medications   Medication Sig Start Date End Date Taking? Authorizing Provider  amLODipine (NORVASC) 10 MG tablet Take  10 mg by mouth daily.    [provider]  Cholecalciferol (VITAMIN D3) 5000 units CAPS Take 1 capsule by mouth daily.    [provider]  gabapentin (NEURONTIN) 300 MG capsule Take 300 mg by mouth 3 (three) times daily.    [provider]  HYDROcodone-acetaminophen (NORCO) 10-325 MG tablet Take 1 tablet by mouth every 6 (six) hours as needed for moderate pain or severe pain.    [provider]  hydroxychloroquine (PLAQUENIL) 200 MG tablet Take 400 mg by mouth daily.    [provider]  insulin glargine (LANTUS) 100 unit/mL SOPN Inject 26 Units into the skin at bedtime.    [provider]  metoprolol succinate (TOPROL-XL) 25 MG 24 hr tablet Take 25 mg by mouth daily.    [provider]  mirtazapine (REMERON) 15 MG tablet Take 15 mg by mouth at bedtime.    [provider]  omega-3 fish oil (MAXEPA) 1000 MG CAPS capsule Take 1 capsule by mouth daily. 600mg  epa/400mg  dha plus vitamin e 5iu    [provider]  ranitidine (ZANTAC) 150 MG tablet Take 150 mg by mouth 2 (two) times daily.    [provider]     Allergies:    Allergies  Allergen Reactions  . Ciprofloxacin Rash    Social History:  Social History   Socioeconomic History  . Marital status: Widowed    Spouse name: Not on file  . Number of children: Not on file  . Years of education: Not on file  . Highest education level: Not on file  Occupational History  . Not on file  Social Needs  . Financial resource strain: Not on file  . Food insecurity:    Worry: Not on file    Inability: Not on file  . Transportation needs:    Medical: Not on file    Non-medical: Not on file  Tobacco Use  . Smoking status: Never Smoker  . Smokeless tobacco: Never Used  Substance and Sexual Activity  . Alcohol use: No  . Drug use: No  . Sexual activity: Not on file  Lifestyle  . Physical activity:    Days per week: Not on file    Minutes per session:  Not on file  . Stress: Not on file  Relationships  . Social connections:    Talks on phone: Not on file    Gets together: Not on file    Attends religious service: Not on file    Active member of club or organization: Not on file    Attends meetings of clubs or organizations: Not on file    Relationship status: Not on file  . Intimate partner violence:    Fear of current or ex partner: Not on file    Emotionally abused: Not on file    Physically abused: Not on file    Forced sexual activity: Not on file  Other Topics Concern  . Not on file  Social History Narrative  . Not on file    Family History:   The patient's family history includes Breast cancer in her sister; Colon cancer in her mother; Heart disease in her father.    ROS:  Please see the history of present illness.   Review of Systems  Constitution: Negative.  HENT: Negative.   Eyes: Negative.   Cardiovascular: Positive for dyspnea on exertion.  Respiratory: Positive for shortness of breath and wheezing.   Hematologic/Lymphatic: Negative.   Musculoskeletal: Negative.  Negative for joint pain.  Gastrointestinal: Negative.        History of diverticulitis  Genitourinary: Negative.   Neurological: Negative.    All other ROS reviewed and negative.     Physical Exam/Data:   Vitals:   12/26/17 0830 12/26/17 0831 12/26/17 0932 12/26/17 1035  BP: (!) 142/73   103/63  Pulse: 64   78  Resp: 16   20  Temp:      TempSrc:      SpO2: 100% 100% 100% 99%  Weight:      Height:       No intake or output data in the 24 hours ending 12/26/17 1104 Filed Weights   12/26/17 0715  Weight: 165 lb (74.8 kg)   Body mass index is 25.84 kg/m.  General:  Well nourished, well developed, short of breath HEENT: normal Lymph: no adenopathy Neck: increased JVD Endocrine:  No thryomegaly Vascular: No carotid bruits; FA pulses 2+ bilaterally without bruits  Cardiac:  normal S1, S2; RRR; no murmur distant HS Lungs:  Decreased  breath sounds with rales at bases Abd: soft, nontender, no hepatomegaly  Ext: no  edema Musculoskeletal:  No deformities, BUE and BLE strength normal and equal Skin: warm and dry  Neuro:  CNs 2-12 intact, no focal abnormalities noted Psych:  Normal affect  EKG:  The ECG that was done  was personally reviewed and demonstrates NSR with PAC's Relevant CV Studies: Echo being done now  Laboratory Data:  Chemistry Recent Labs  Lab 12/26/17 0845  NA 139  K 4.3  CL 104  CO2 26  GLUCOSE 104*  BUN 22*  CREATININE 1.25*  CALCIUM 9.1  GFRNONAA 38*  GFRAA 44*  ANIONGAP 9    No results for input(s): PROT, ALBUMIN, AST, ALT, ALKPHOS, BILITOT in the last 168 hours. Hematology Recent Labs  Lab 12/26/17 0845  WBC 10.0  RBC 3.93  HGB 12.6  HCT 39.8  MCV 101.3*  MCH 32.1  MCHC 31.7  RDW 12.6  PLT 225   Cardiac Enzymes Recent Labs  Lab 12/26/17 0845  TROPONINI 1.29*   No results for input(s): TROPIPOC in the last 168 hours.  BNP Recent Labs  Lab 12/26/17 0845  BNP 1,003.0*    DDimer No results for input(s): DDIMER in the last 168 hours.  Radiology/Studies:  Dg Chest 2 View  Result Date: 12/26/2017 CLINICAL DATA:  Productive cough.  Shortness of breath. EXAM: CHEST - 2 VIEW COMPARISON:  March 20, 2017 FINDINGS: No pneumothorax. Persistent surgical changes in the right chest with associated scarring. No definitive infiltrate. The cardiomediastinal silhouette is stable. Probable tiny right pleural effusion versus pleural thickening seen on the lateral view. IMPRESSION: 1. Stable postsurgical changes in the right upper chest with scarring. No convincing infiltrate. Pleural thickening versus a tiny right pleural effusion. Electronically Signed   By: Dorise Bullion III M.D   On: 12/26/2017 08:19    Assessment and Plan:   1. NSTEMI Troponin 1.29 with CHF-IV heparin, transfer to  [facility] ICU for cath. 2. Acute CHF? Systolic vs ischemic-echo being done now Lasix 40 mg IV  given in ED 3. CAD S/P CABG 1997 [place]-hasn't been followed by Cardiologist in years 32. SVT/WCT in ER-no history of- IV Amio started 5. Hyperlipidemia 6. Hypertension on Norvasc at home 7. Type 2 Diabetes mellitus on insulin 8. Asthma 9. History of diverticulitis  Severity of Illness: The appropriate patient status for this patient is OBSERVATION. Observation status is judged to be reasonable and necessary in order to provide the required intensity of service to ensure the patient's safety. The patient's presenting symptoms, physical exam findings, and initial radiographic and laboratory data in the context of their medical condition is felt to place them at decreased risk for further clinical deterioration. Furthermore, it is anticipated that the patient will be medically stable for discharge from the hospital within 2 midnights of admission. The following factors support the patient status of observation.   " The patient's presenting symptoms include shortness of breath. " The physical exam findings include CHF. " The initial radiographic and laboratory data areNSTEMI & CHF.     For questions or updates, please contact [place] Please consult [email or web address] for contact info under Cardiology/STEMI.    Sumner Boast, PA-C  12/26/2017 11:04 AM   Patient seen and discussed with PA Bonnell Public, I agree with her documentation above. Patient visiting from out of town, lives in [place]. She reports history of prior CABG in [place], PAD with prior stents, COPD. Presents with SOB that started this morning. Denies any chest pain. In ER for her SOB was given albuterol and solumedrol, since that time progressive increasing in heart rates with progressive SVT initially and later went into afib.   In ER received albuterol, atrovent, solumedrol 125mg ASA 325, lasix 40,   K 4.3  Cr 1.25 BUN 22 Hgb 12.6 Plt 225 BNP 1000 CXR no acute process EKG SR, PACs EKG SR, PACs. Most recent with ST  depression V5 and V6.  EMS sats 89%.   Heart rates first 3 hours of presentation were normal, really did not see elevated until after albuterol and steroids, she feels shaky since these meds. She did not present initially  in afib or tachycardic. She does not feel palpitations so unclear true chronicity. Denies any known history of afib. With afib with RVR and soft bp's we will start amiodarone. She is starting heparin in setting of new afib and elevated troponin. We tried bedside echo but heart rates too fast, will try once rates better control, very roughly LVEF looked about 40%.   Initially planned transfer for cath as this appeared to be an NSTEMI. As patient began to have progressive issues with tachy atrial arrythmias the concern for a possible PE increased. I think PE less likely but with her acute onset of symptoms, hypxoia, and what looks to be new atrial arrythmias it is a concern. History of colon cancer s/p resection 7 years go now cancer free, no prior DVT/PE. Recent car trip from [place] to [place], then [place] to [place] so no real long durations.   We will ask for a CT PE to exclude clot, will need prior to transfer. If negative then resume plans for transfer and cath.    Carlyle Dolly MD

## 2017-12-26 NOTE — Progress Notes (Addendum)
ANTICOAGULATION CONSULT NOTE - Initial Consult  Pharmacy Consult for heparin dosing Indication: chest pain/ACS /NSTEMI  Allergies  Allergen Reactions  . Ciprofloxacin Rash    Patient Measurements: Height: 5\' 7"  (170.2 cm) Weight: 165 lb (74.8 kg) IBW/kg (Calculated) : 61.6 Heparin Dosing Weight: HEPARIN DW (KG): 74.8  Vital Signs: Temp: 98 F (36.7 C) (06/24 0718) Temp Source: Oral (06/24 0718) BP: 103/63 (06/24 1035) Pulse Rate: 78 (06/24 1035)  Labs: Recent Labs    12/26/17 0845  HGB 12.6  HCT 39.8  PLT 225  CREATININE 1.25*  TROPONINI 1.29*    Estimated Creatinine Clearance: 34.1 mL/min (A) (by C-G formula based on SCr of 1.25 mg/dL (H)).   Medical History: Past Medical History:  Diagnosis Date  . Arthritis   . Cancer [facility])    colon cancer  . CHF (congestive heart failure) ([place])   . CKD (chronic kidney disease)   . COPD (chronic obstructive pulmonary disease) ([place])   . Coronary artery disease   . DDD (degenerative disc disease), lumbar   . Diabetes mellitus without complication ([place])   . Diabetic neuropathy ([place])   . GERD (gastroesophageal reflux disease)   . Gout   . Hypertension   . Renal disorder    Pt has only one functioning kidney   . Restless leg syndrome     Medications:   (Not in a hospital admission) Scheduled:  . amiodarone  150 mg Intravenous Once    Assessment: 51 yof with atrial fibrillation and NSTEMI (elevated troponin of 1.29).  Goal of Therapy:  Heparin level 0.3-0.7 units/ml Monitor platelets by anticoagulation protocol: Yes   Plan:  Give 4000  units bolus x 1 Start heparin infusion at 900 units/hr Check anti-Xa level in 6-8 hours and daily while on heparin Continue to monitor H&H and platelets     Despina Pole 12/26/2017,11:07 AM

## 2017-12-26 NOTE — ED Triage Notes (Addendum)
Patient brought in by EMS with complaint of shortness of breath since Friday. States she has COPD and is coughing up green colored sputum. Per EMS, patient's O2 was 89% on room air. Patient is not on O2 at home.

## 2017-12-26 NOTE — ED Notes (Signed)
Dr Harl Bowie ok 256ml ns bolus and aminodarone decreased to 39ml/hr

## 2017-12-26 NOTE — ED Notes (Signed)
CRITICAL VALUE ALERT  Critical Value:  Trop 1.29  Date & Time Notied:  12/26/17, 1071  Provider Notified: Dr. Thurnell Garbe  Orders Received/Actions taken: n/a

## 2017-12-27 ENCOUNTER — Inpatient Hospital Stay (HOSPITAL_COMMUNITY): Payer: Medicare Other

## 2017-12-27 DIAGNOSIS — I4891 Unspecified atrial fibrillation: Secondary | ICD-10-CM

## 2017-12-27 DIAGNOSIS — E0590 Thyrotoxicosis, unspecified without thyrotoxic crisis or storm: Secondary | ICD-10-CM

## 2017-12-27 DIAGNOSIS — N184 Chronic kidney disease, stage 4 (severe): Secondary | ICD-10-CM

## 2017-12-27 DIAGNOSIS — I509 Heart failure, unspecified: Secondary | ICD-10-CM

## 2017-12-27 DIAGNOSIS — I5021 Acute systolic (congestive) heart failure: Secondary | ICD-10-CM

## 2017-12-27 LAB — GLUCOSE, CAPILLARY
GLUCOSE-CAPILLARY: 138 mg/dL — AB (ref 70–99)
Glucose-Capillary: 222 mg/dL — ABNORMAL HIGH (ref 70–99)
Glucose-Capillary: 211 mg/dL — ABNORMAL HIGH (ref 70–99)
Glucose-Capillary: 208 mg/dL — ABNORMAL HIGH (ref 70–99)

## 2017-12-27 LAB — BASIC METABOLIC PANEL
Anion gap: 9 (ref 5–15)
BUN: 26 mg/dL — ABNORMAL HIGH (ref 8–23)
CO2: 25 mmol/L (ref 22–32)
Calcium: 9.1 mg/dL (ref 8.9–10.3)
Chloride: 102 mmol/L (ref 98–111)
Creatinine, Ser: 1.86 mg/dL — ABNORMAL HIGH (ref 0.44–1.00)
GFR calc Af Amer: 27 mL/min — ABNORMAL LOW (ref >60)
GFR calc non Af Amer: 23 mL/min — ABNORMAL LOW (ref >60)
Glucose, Bld: 268 mg/dL — ABNORMAL HIGH (ref 70–99)
Potassium: 4.3 mmol/L (ref 3.5–5.1)
Sodium: 136 mmol/L (ref 135–145)

## 2017-12-27 LAB — T3, FREE: T3, Free: 2.6 pg/mL (ref 2.0–4.4)

## 2017-12-27 LAB — HEMOGLOBIN A1C
Hgb A1c MFr Bld: 5.9 % — ABNORMAL HIGH (ref 4.8–5.6)
MEAN PLASMA GLUCOSE: 122.63 mg/dL

## 2017-12-27 LAB — ECHOCARDIOGRAM COMPLETE
HEIGHTINCHES: 67 in
Weight: 2652.57 oz

## 2017-12-27 LAB — HEPARIN LEVEL (UNFRACTIONATED): Heparin Unfractionated: 0.45 IU/mL (ref 0.30–0.70)

## 2017-12-27 MED ORDER — SODIUM CHLORIDE 0.9% FLUSH
3.0000 mL | INTRAVENOUS | Status: DC | PRN
  Administered 2017-12-28: 3 mL via INTRAVENOUS
  Filled 2017-12-27: qty 3

## 2017-12-27 MED ORDER — METOPROLOL SUCCINATE ER 50 MG PO TB24
50.0000 mg | ORAL_TABLET | Freq: Every day | ORAL | Status: DC
  Administered 2017-12-28 – 2017-12-31 (×4): 50 mg via ORAL
  Filled 2017-12-27 (×5): qty 1

## 2017-12-27 MED ORDER — DIPHENHYDRAMINE HCL 50 MG/ML IJ SOLN
25.0000 mg | Freq: Once | INTRAMUSCULAR | Status: AC
  Administered 2017-12-28: 25 mg via INTRAVENOUS
  Filled 2017-12-27: qty 1

## 2017-12-27 MED ORDER — METHYLPREDNISOLONE SODIUM SUCC 125 MG IJ SOLR
125.0000 mg | Freq: Once | INTRAMUSCULAR | Status: AC
  Administered 2017-12-28: 125 mg via INTRAVENOUS
  Filled 2017-12-27: qty 2

## 2017-12-27 MED ORDER — SODIUM CHLORIDE 0.9 % IV SOLN: 250.0000 mL | INTRAVENOUS | Status: DC | PRN

## 2017-12-27 MED ORDER — SODIUM CHLORIDE 0.9% FLUSH
3.0000 mL | Freq: Two times a day (BID) | INTRAVENOUS | Status: DC
  Administered 2017-12-27 – 2017-12-28 (×3): 3 mL via INTRAVENOUS

## 2017-12-27 MED ORDER — INSULIN ASPART 100 UNIT/ML ~~LOC~~ SOLN
0.0000 [IU] | Freq: Three times a day (TID) | SUBCUTANEOUS | Status: DC
  Administered 2017-12-27: 3 [IU] via SUBCUTANEOUS
  Administered 2017-12-27: 2 [IU] via SUBCUTANEOUS
  Administered 2017-12-29: 1 [IU] via SUBCUTANEOUS
  Administered 2017-12-29: 2 [IU] via SUBCUTANEOUS
  Administered 2017-12-29 – 2017-12-30 (×2): 3 [IU] via SUBCUTANEOUS

## 2017-12-27 MED ORDER — PERFLUTREN LIPID MICROSPHERE
1.0000 mL | INTRAVENOUS | Status: AC | PRN
  Filled 2017-12-27: qty 10

## 2017-12-27 MED ORDER — INSULIN GLARGINE 100 UNIT/ML ~~LOC~~ SOLN
20.0000 [IU] | Freq: Every day | SUBCUTANEOUS | Status: DC
  Administered 2017-12-27 – 2017-12-30 (×4): 20 [IU] via SUBCUTANEOUS
  Filled 2017-12-27 (×5): qty 0.2

## 2017-12-27 MED ORDER — PERFLUTREN LIPID MICROSPHERE
INTRAVENOUS | Status: AC
  Filled 2017-12-27: qty 10

## 2017-12-27 MED ORDER — SODIUM CHLORIDE 0.9 % WEIGHT BASED INFUSION
1.0000 mL/kg/h | INTRAVENOUS | Status: DC
  Administered 2017-12-27 – 2017-12-28 (×3): 1 mL/kg/h via INTRAVENOUS

## 2017-12-27 NOTE — Progress Notes (Signed)
ANTICOAGULATION CONSULT NOTE - Follow Up Consult  Pharmacy Consult for Heparin Indication: chest pain/ACS  Allergies  Allergen Reactions  . Ciprofloxacin Rash and Itching  . Iodinated Diagnostic Agents Rash    Patient Measurements: Height: 5\' 7"  (170.2 cm) Weight: 163 lb 12.8 oz (74.3 kg) IBW/kg (Calculated) : 61.6 Heparin Dosing Weight: 74.3 kg  Vital Signs: Temp: 97.9 F (36.6 C) (06/25 0000) Temp Source: Oral (06/25 0000) BP: 95/64 (06/25 0000) Pulse Rate: 68 (06/25 0000)  Labs: Recent Labs    12/26/17 0845 12/26/17 1115 12/26/17 1801 12/27/17 0159  HGB 12.6  --   --   --   HCT 39.8  --   --   --   PLT 225  --   --   --   HEPARINUNFRC  --   --  0.57 0.45  CREATININE 1.25*  --   --  1.86*  TROPONINI 1.29* 0.86*  --   --     Estimated Creatinine Clearance: 22.9 mL/min (A) (by C-G formula based on SCr of 1.86 mg/dL (H)).   Medications:  Infusions:  . sodium chloride 10 mL/hr at 12/27/17 0000  . amiodarone 30 mg/hr (12/27/17 0000)  . heparin 900 Units/hr (12/27/17 0000)  . sodium chloride      Assessment: 84 year old female transferred from [facility] on IV heparin for ACS/NSTEMI with plan for Cath in AM.   Heparin level therapeutic x 2  Goal of Therapy:  Heparin level 0.3-0.7 units/ml Monitor platelets by anticoagulation protocol: Yes   Plan:  Continue Heparin at 900 units/hr Daily CBC/HL Monitor for bleeding  Narda Bonds, PharmD, BCPS Clinical Pharmacist Phone: [phone]

## 2017-12-27 NOTE — Progress Notes (Signed)
Progress Note  Patient Name: Debra [name] Date of Encounter: 12/27/2017  Primary Cardiologist: Carlyle Dolly, MD   Subjective   Feels well this am. Denies SOB or chest pain. No palpitations.   Inpatient Medications    Scheduled Meds: . aspirin EC  81 mg Oral Daily  . atorvastatin  80 mg Oral q1800  . gabapentin  300 mg Oral TID  . hydroxychloroquine  400 mg Oral Daily  . insulin aspart  0-9 Units Subcutaneous TID WC  . insulin glargine  20 Units Subcutaneous QHS  . metoprolol succinate  50 mg Oral Daily   Continuous Infusions: . sodium chloride 10 mL/hr at 12/27/17 0600  . heparin 900 Units/hr (12/27/17 0600)  . sodium chloride     PRN Meds: acetaminophen, HYDROcodone-acetaminophen, nitroGLYCERIN, ondansetron (ZOFRAN) IV, zolpidem   Vital Signs    Vitals:   12/27/17 0530 12/27/17 0600 12/27/17 0749 12/27/17 0800  BP: (!) 96/46 (!) 97/46  107/62  Pulse: (!) 53 62  (!) 116  Resp: 17 20  19   Temp:   97.9 F (36.6 C)   TempSrc:   Axillary   SpO2: 99% 100%  98%  Weight:      Height:        Intake/Output Summary (Last 24 hours) at 12/27/2017 0829 Last data filed at 12/27/2017 0600 Gross per 24 hour  Intake 935.23 ml  Output 150 ml  Net 785.23 ml   Filed Weights   12/26/17 0715 12/26/17 1500 12/27/17 0500  Weight: 165 lb (74.8 kg) 163 lb 12.8 oz (74.3 kg) 165 lb 12.6 oz (75.2 kg)    Telemetry    NSR with frequent PACs. One 5 beat run NSVT - Personally Reviewed  ECG    NSR with frequent PACs. ST depression in lateral leads. - Personally Reviewed  Physical Exam  Elderly WF in NAD GEN: No acute distress.   Neck: No JVD Cardiac: RRR, no murmurs, rubs, or gallops. Pulses 2+  Respiratory: Clear to auscultation bilaterally. GI: Soft, nontender, non-distended  MS: No edema; No deformity. Neuro:  Nonfocal  Psych: Normal affect   Labs    Chemistry Recent Labs  Lab 12/26/17 0845 12/27/17 0159  NA 139 136  K 4.3 4.3  CL 104 102  CO2 26 25    GLUCOSE 104* 268*  BUN 22* 26*  CREATININE 1.25* 1.86*  CALCIUM 9.1 9.1  GFRNONAA 38* 23*  GFRAA 44* 27*  ANIONGAP 9 9     Hematology Recent Labs  Lab 12/26/17 0845  WBC 10.0  RBC 3.93  HGB 12.6  HCT 39.8  MCV 101.3*  MCH 32.1  MCHC 31.7  RDW 12.6  PLT 225    Cardiac Enzymes Recent Labs  Lab 12/26/17 0845 12/26/17 1115  TROPONINI 1.29* 0.86*   No results for input(s): TROPIPOC in the last 168 hours.   BNP Recent Labs  Lab 12/26/17 0845  BNP 1,003.0*     DDimer No results for input(s): DDIMER in the last 168 hours.   Radiology    Dg Chest 2 View  Result Date: 12/26/2017 CLINICAL DATA:  Productive cough.  Shortness of breath. EXAM: CHEST - 2 VIEW COMPARISON:  March 20, 2017 FINDINGS: No pneumothorax. Persistent surgical changes in the right chest with associated scarring. No definitive infiltrate. The cardiomediastinal silhouette is stable. Probable tiny right pleural effusion versus pleural thickening seen on the lateral view. IMPRESSION: 1. Stable postsurgical changes in the right upper chest with scarring. No convincing infiltrate. Pleural thickening  versus a tiny right pleural effusion. Electronically Signed   By: Dorise Bullion III M.D   On: 12/26/2017 08:19   Ct Angio Chest Pe W/cm &/or Wo Cm  Result Date: 12/26/2017 CLINICAL DATA:  84 year old female with shortness of breath since 12/23/2017. Subsequent encounter. EXAM: CT ANGIOGRAPHY CHEST WITH CONTRAST TECHNIQUE: Multidetector CT imaging of the chest was performed using the standard protocol during bolus administration of intravenous contrast. Multiplanar CT image reconstructions and MIPs were obtained to evaluate the vascular anatomy. CONTRAST:  43mL ISOVUE-370 IOPAMIDOL (ISOVUE-370) INJECTION 76% COMPARISON:  None. FINDINGS: Cardiovascular:  No pulmonary embolus. Heart slightly enlarged.  Coronary artery calcifications. Calcified plaque throughout the thoracic aorta and great vessels. No aortic  dissection. Ascending thoracic aorta measures up to 3.5 cm. Post stenting renal arteries bilaterally. Mediastinum/Nodes: Small slightly matted partially calcified top-normal size right hilar lymph nodes. Small hiatal hernia. Lungs/Pleura: Postsurgical changes right upper lobe with peribronchial thickening. No comparisons to help establish stability in this region. Left upper lobe 9 x 7 x 4 mm nodule (series 6, image 26 and series 8, image 94). Peripheral left upper lobe 5 x 3 mm nodule (series 6, image 34). Calcified granuloma right upper lobe and right lung base. Small right-sided pleural effusion. Cluster of small nodules superior aspect left lower lobe (series 6 images 49 through 53). Upper Abdomen: Left renal cyst.  Post cholecystectomy. Musculoskeletal: Scoliosis thoracic spine without osseous destructive lesion. Review of the MIP images confirms the above findings. IMPRESSION: No pulmonary embolus detected. Small right-sided pleural effusion of indeterminate etiology. Postsurgical changes right upper lobe with peribronchial thickening. Stability cannot be confirmed as there are no comparison CT exams. Scattered pulmonary nodules largest left upper lobe measuring 9 x 7 x 4 mm (series 6, image 26 and series 8, image 94). Consider one of the following in 3 months for both low-risk and high-risk individuals: (a) repeat chest CT, (b) follow-up PET-CT, or (c) tissue sampling. This recommendation follows the consensus statement: Guidelines for Management of Incidental Pulmonary Nodules Detected on CT Images: From the Fleischner Society 2017; Radiology 2017; 284:228-243. Cardiomegaly with coronary artery calcifications. Aortic Atherosclerosis (ICD10-I70.0). Electronically Signed   By: Genia Del M.D.   On: 12/26/2017 12:49    Cardiac Studies   none  Patient Profile     84 y.o. female with history of remote CABG in 1997 in [place] presented to [facility] with acute SOB. When receiving inhaler  therapy she developed SVT and then Afib. She was placed on IV amiodarone. Troponin positive up to 1.29. CT chest negative for PE. Transferred for cardiac cath. Limited Echo suggested EF 40% but done during Afib with RVR.   Assessment & Plan    1. Acute respiratory failure. Possibly due to COPD exacerbation versus CHF. Appears euvolemic today. Will hold off on further diuresis given her CKD. Will reassess LV function today with Echo 2. NSTEMI. Elevated troponin and lateral ST depression noted on Ecg. 22 years post CABG. Will continue IV heparin, ASA, statin. Resume Toprol XL. Plan cardiac cath once renal function stable. Concerned that creatinine has increased from 1.25 to 1.86 post chest CT. She is at her baseline. Will hydrate overnight and if renal function stable proceed with cath tomorrow. The procedure and risks were reviewed including but not limited to death, myocardial infarction, stroke, arrythmias, bleeding, transfusion, emergency surgery, dye allergy, or renal dysfunction. The patient voices understanding and is agreeable to proceed.. 3. Acute on chronic systolic CHF. Await Echo results. May  not be a good candidate for ARB/ACEi due to CKD 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 Baseline creatinine 1.7-1.8 but has been as high as 2.14.  5. Afib with RVR. No recurrence. Will stop amiodarone. May not be the best choice for AAD due to hyperthyroidism. 6. History of hyperthyroidism. TSH low and Free T4 a little high. S/p thyroidectomy in past. Will continue beta blocker for now. Will need follow up with primary care.  7. IDDM will resume lantus and cover with SSI.  8. HLD. Lipid level pending. On high dose statin.   For questions or updates, please contact [place] Please consult [email or web address] for contact info under Cardiology/STEMI.      Signed, Atharv Barriere [place], MD  12/27/2017, 8:29 AM

## 2017-12-27 NOTE — Progress Notes (Signed)
  Echocardiogram 2D Echocardiogram has been performed.  Madelaine Etienne 12/27/2017, 11:52 AM

## 2017-12-27 NOTE — Progress Notes (Signed)
Chaplain presented to the patient's room, daughter was also present at this time. Introduction made as Chaplain, and explanation of visit was to assist with an Advance Directive.  The patient states she had AD completed some years ago, Chaplain explained , but we also need to have one completed for the [place], which will be a part her medical history through out the system.  The AD was left with the patient and daughter to complete, and instructed to inform the staff when ready to have it witnessed and notarized. A copy will be placed in her medical file. Chaplain also shared a words of encouragement, and a prayer for healing and well being. Chaplain Yaakov Guthrie [phone]

## 2017-12-28 ENCOUNTER — Inpatient Hospital Stay (HOSPITAL_COMMUNITY)
Admission: EM | Disposition: A | Discharge status: Home Health | Payer: Self-pay | Source: Home / Self Care | Attending: Cardiology

## 2017-12-28 DIAGNOSIS — I2510 Atherosclerotic heart disease of native coronary artery without angina pectoris: Secondary | ICD-10-CM

## 2017-12-28 DIAGNOSIS — I5023 Acute on chronic systolic (congestive) heart failure: Secondary | ICD-10-CM

## 2017-12-28 HISTORY — PX: LEFT HEART CATH AND CORS/GRAFTS ANGIOGRAPHY: CATH118250

## 2017-12-28 HISTORY — PX: ULTRASOUND GUIDANCE FOR VASCULAR ACCESS: SHX6516

## 2017-12-28 LAB — HEPARIN LEVEL (UNFRACTIONATED): Heparin Unfractionated: 0.34 IU/mL (ref 0.30–0.70)

## 2017-12-28 LAB — BASIC METABOLIC PANEL
Anion gap: 9 (ref 5–15)
BUN: 32 mg/dL — AB (ref 8–23)
CO2: 23 mmol/L (ref 22–32)
CREATININE: 1.76 mg/dL — AB (ref 0.44–1.00)
Calcium: 8.7 mg/dL — ABNORMAL LOW (ref 8.9–10.3)
Chloride: 101 mmol/L (ref 98–111)
GFR calc Af Amer: 29 mL/min — ABNORMAL LOW (ref >60)
GFR, EST NON AFRICAN AMERICAN: 25 mL/min — AB (ref >60)
GLUCOSE: 154 mg/dL — AB (ref 70–99)
Potassium: 4.1 mmol/L (ref 3.5–5.1)
SODIUM: 133 mmol/L — AB (ref 135–145)

## 2017-12-28 LAB — GLUCOSE, CAPILLARY
GLUCOSE-CAPILLARY: 158 mg/dL — AB (ref 70–99)
GLUCOSE-CAPILLARY: 212 mg/dL — AB (ref 70–99)
Glucose-Capillary: 117 mg/dL — ABNORMAL HIGH (ref 70–99)
Glucose-Capillary: 113 mg/dL — ABNORMAL HIGH (ref 70–99)

## 2017-12-28 LAB — CBC
HCT: 37.2 % (ref 36.0–46.0)
Hemoglobin: 11.9 g/dL — ABNORMAL LOW (ref 12.0–15.0)
MCH: 32 pg (ref 26.0–34.0)
MCHC: 32 g/dL (ref 30.0–36.0)
MCV: 100 fL (ref 78.0–100.0)
PLATELETS: 220 10*3/uL (ref 150–400)
RBC: 3.72 MIL/uL — ABNORMAL LOW (ref 3.87–5.11)
RDW: 12.5 % (ref 11.5–15.5)
WBC: 14.5 10*3/uL — ABNORMAL HIGH (ref 4.0–10.5)

## 2017-12-28 LAB — LIPID PANEL
CHOLESTEROL: 89 mg/dL (ref 0–200)
HDL: 36 mg/dL — ABNORMAL LOW (ref >40)
LDL Cholesterol: 38 mg/dL (ref 0–99)
Total CHOL/HDL Ratio: 2.5 RATIO
Triglycerides: 73 mg/dL (ref <150)
VLDL: 15 mg/dL (ref 0–40)

## 2017-12-28 LAB — POCT ACTIVATED CLOTTING TIME: ACTIVATED CLOTTING TIME: 125 s

## 2017-12-28 SURGERY — LEFT HEART CATH AND CORS/GRAFTS ANGIOGRAPHY
Anesthesia: LOCAL

## 2017-12-28 MED ORDER — SODIUM CHLORIDE 0.9 % IV SOLN: 250.0000 mL | INTRAVENOUS | Status: DC | PRN

## 2017-12-28 MED ORDER — SODIUM CHLORIDE 0.9% FLUSH
3.0000 mL | Freq: Two times a day (BID) | INTRAVENOUS | Status: DC
  Administered 2017-12-28 – 2017-12-31 (×5): 3 mL via INTRAVENOUS

## 2017-12-28 MED ORDER — SODIUM CHLORIDE 0.9% FLUSH
3.0000 mL | INTRAVENOUS | Status: DC | PRN
  Administered 2017-12-29: 3 mL via INTRAVENOUS
  Filled 2017-12-28: qty 3

## 2017-12-28 MED ORDER — AMIODARONE IV BOLUS ONLY 150 MG/100ML
150.0000 mg | Freq: Once | INTRAVENOUS | Status: AC
  Administered 2017-12-28: 150 mg via INTRAVENOUS

## 2017-12-28 MED ORDER — IOHEXOL 350 MG/ML SOLN
INTRAVENOUS | Status: DC | PRN
  Administered 2017-12-28: 50 mL via INTRAVENOUS

## 2017-12-28 MED ORDER — FUROSEMIDE 10 MG/ML IJ SOLN
INTRAMUSCULAR | Status: AC
  Administered 2017-12-28: 20 mg
  Filled 2017-12-28: qty 2

## 2017-12-28 MED ORDER — HEPARIN (PORCINE) IN NACL 2-0.9 UNITS/ML
INTRAMUSCULAR | Status: AC | PRN
  Administered 2017-12-28 (×2): 500 mL

## 2017-12-28 MED ORDER — AMIODARONE HCL IN DEXTROSE 360-4.14 MG/200ML-% IV SOLN
30.0000 mg/h | INTRAVENOUS | Status: DC
  Administered 2017-12-28 – 2017-12-29 (×2): 30 mg/h via INTRAVENOUS
  Filled 2017-12-28 (×3): qty 200

## 2017-12-28 MED ORDER — MIDAZOLAM HCL 2 MG/2ML IJ SOLN
INTRAMUSCULAR | Status: AC
  Filled 2017-12-28: qty 2

## 2017-12-28 MED ORDER — HEPARIN (PORCINE) IN NACL 1000-0.9 UT/500ML-% IV SOLN
INTRAVENOUS | Status: AC
  Filled 2017-12-28: qty 1000

## 2017-12-28 MED ORDER — FUROSEMIDE 10 MG/ML PO SOLN: 20.0000 mg | Freq: Once | ORAL | Status: DC

## 2017-12-28 MED ORDER — FUROSEMIDE 10 MG/ML IJ SOLN
20.0000 mg | Freq: Once | INTRAMUSCULAR | Status: AC
  Administered 2017-12-28: 20 mg via INTRAVENOUS

## 2017-12-28 MED ORDER — AMIODARONE HCL IN DEXTROSE 360-4.14 MG/200ML-% IV SOLN
60.0000 mg/h | INTRAVENOUS | Status: DC
  Administered 2017-12-28 (×2): 60 mg/h via INTRAVENOUS
  Filled 2017-12-28: qty 200

## 2017-12-28 MED ORDER — LIDOCAINE HCL (PF) 1 % IJ SOLN
INTRAMUSCULAR | Status: AC
  Filled 2017-12-28: qty 30

## 2017-12-28 MED ORDER — SODIUM CHLORIDE 0.9 % WEIGHT BASED INFUSION: 10.0000 mL/h | INTRAVENOUS | Status: DC

## 2017-12-28 MED ORDER — AMIODARONE HCL IN DEXTROSE 360-4.14 MG/200ML-% IV SOLN
INTRAVENOUS | Status: AC
  Filled 2017-12-28: qty 200

## 2017-12-28 MED ORDER — FUROSEMIDE 10 MG/ML IJ SOLN
INTRAMUSCULAR | Status: DC | PRN
  Administered 2017-12-28: 80 mg via INTRAVENOUS

## 2017-12-28 MED ORDER — FUROSEMIDE 10 MG/ML IJ SOLN
INTRAMUSCULAR | Status: AC
  Filled 2017-12-28: qty 8

## 2017-12-28 MED ORDER — LIDOCAINE HCL (PF) 1 % IJ SOLN
INTRAMUSCULAR | Status: DC | PRN
  Administered 2017-12-28: 17 mL
  Administered 2017-12-28: 15 mL
  Administered 2017-12-28: 17 mL

## 2017-12-28 MED ORDER — AMIODARONE LOAD VIA INFUSION
150.0000 mg | Freq: Once | INTRAVENOUS | Status: DC
  Filled 2017-12-28: qty 83.34

## 2017-12-28 MED ORDER — MIDAZOLAM HCL 2 MG/2ML IJ SOLN
INTRAMUSCULAR | Status: DC | PRN
  Administered 2017-12-28 (×2): 0.5 mg via INTRAVENOUS

## 2017-12-28 MED ORDER — FUROSEMIDE 10 MG/ML IJ SOLN
80.0000 mg | Freq: Two times a day (BID) | INTRAMUSCULAR | Status: DC
  Administered 2017-12-28 – 2017-12-29 (×2): 80 mg via INTRAVENOUS
  Filled 2017-12-28 (×2): qty 8

## 2017-12-28 SURGICAL SUPPLY — 11 items
CATH INFINITI 5FR MULTPACK ANG (CATHETERS) ×3 IMPLANT
COVER PRB 48X5XTLSCP FOLD TPE (BAG) ×4 IMPLANT
COVER PROBE 5X48 (BAG) ×2
ELECT DEFIB PAD ADLT CADENCE (PAD) ×3 IMPLANT
KIT HEART LEFT (KITS) ×3 IMPLANT
PACK CARDIAC CATHETERIZATION (CUSTOM PROCEDURE TRAY) ×3 IMPLANT
SHEATH PINNACLE 5F 10CM (SHEATH) ×6 IMPLANT
TRANSDUCER W/STOPCOCK (MISCELLANEOUS) ×3 IMPLANT
WIRE EMERALD 3MM-J .035X150CM (WIRE) ×3 IMPLANT
WIRE EMERALD 3MM-J .035X260CM (WIRE) ×3 IMPLANT
WIRE HI TORQ VERSACORE-J 145CM (WIRE) ×3 IMPLANT

## 2017-12-28 NOTE — Progress Notes (Signed)
Patient's HR now decreased to 60's - 80's / A-Fib.  Lasix IV given as per MD instructions and fluid decreased.  Rate was fluctuating and patient was occasionally symptomatic with dyspnea.  Orders received.  1150:  Patient converted to NSR - SB R==60-70.  Decreased SOB.

## 2017-12-28 NOTE — Progress Notes (Signed)
Patient's rhythm noted to be in A- fib.  Quickly escalated to RVR with rate in 180's.  Dr. Martinique notified and Amiodarone bolus given / gtt re-started.  Patient symptomatic with dizziness / SOB.  Will continue to monitor as symptoms subside.   Continues in A-fib, but rate decreased to 100-110.

## 2017-12-28 NOTE — Interval H&P Note (Signed)
History and Physical Interval Note:  12/28/2017 2:41 PM  Debra [name]  has presented today for surgery, with the diagnosis of n stemi  The various methods of treatment have been discussed with the patient and family. After consideration of risks, benefits and other options for treatment, the patient has consented to  Procedure(s): LEFT HEART CATH AND CORONARY ANGIOGRAPHY (N/A) as a surgical intervention .  The patient's history has been reviewed, patient examined, no change in status, stable for surgery.  I have reviewed the patient's chart and labs.  Questions were answered to the patient's satisfaction.    Cath Lab Visit (complete for each Cath Lab visit)  Clinical Evaluation Leading to the Procedure:   ACS: Yes.    Non-ACS:    Anginal Classification: CCS IV  Anti-ischemic medical therapy: Minimal Therapy (1 class of medications)  Non-Invasive Test Results: No non-invasive testing performed  Prior CABG: Previous CABG       Debra [name] 12/28/2017 2:41 PM

## 2017-12-28 NOTE — Progress Notes (Signed)
ANTICOAGULATION CONSULT NOTE - Follow Up Consult  Pharmacy Consult for Heparin Indication: chest pain/ACS  Allergies  Allergen Reactions  . Ciprofloxacin Rash and Itching  . Iodinated Diagnostic Agents Rash    Patient Measurements: Height: 5\' 7"  (170.2 cm) Weight: 170 lb 3.1 oz (77.2 kg) IBW/kg (Calculated) : 61.6 Heparin Dosing Weight: 74.3 kg  Vital Signs: Temp: 97.8 F (36.6 C) (06/26 0041) Temp Source: Oral (06/26 0041) BP: 126/77 (06/26 0830) Pulse Rate: 128 (06/26 0830)  Labs: Recent Labs    12/26/17 0845 12/26/17 1115 12/26/17 1801 12/27/17 0159 12/28/17 0334  HGB 12.6  --   --   --  11.9*  HCT 39.8  --   --   --  37.2  PLT 225  --   --   --  220  HEPARINUNFRC  --   --  0.57 0.45 0.34  CREATININE 1.25*  --   --  1.86* 1.76*  TROPONINI 1.29* 0.86*  --   --   --     Estimated Creatinine Clearance: 24.6 mL/min (A) (by C-G formula based on SCr of 1.76 mg/dL (H)).   Medications:  Infusions:  . sodium chloride    . sodium chloride 1 mL/kg/hr (12/28/17 0037)  . amiodarone 60 mg/hr (12/28/17 0815)   Followed by  . amiodarone    . heparin 900 Units/hr (12/28/17 0354)  . sodium chloride      Assessment: 84 year old female with NSTEMI and afib on heparin. Plans for Cath today. -heparin level at goal -CBC stable  Goal of Therapy:  Heparin level 0.3-0.7 units/ml Monitor platelets by anticoagulation protocol: Yes   Plan:  Continue Heparin at 900 units/hr Daily CBC/HL Will follow plans post cath  Hildred Laser, PharmD Clinical Pharmacist Clinical phone from 8:30-4:00 is [phone] After 4pm, please call Main [facility] [phone]) for assistance. 12/28/2017 9:36 AM

## 2017-12-28 NOTE — CV Procedure (Signed)
Site area: right and left femoral artery  Site Prior to Removal:  right: level 3, left: level 0 Pressure Applied For: right: 52mins     left: 30mins Manual: yes Patient Status During Pull: stable, hypotensive episode- pt placed in [place] and instructed to cough with good result of blood pressure returning to previous levels.  Post Pull Site:  right: level 1 left: level 0  Post Pull Instructions Given: yes  Post Pull Pulses Present: dopplerable DPs Dressing Applied: gauze and tegaderm  Bedrest begins @ [id number] Comments: n/a

## 2017-12-28 NOTE — Progress Notes (Signed)
Pharmacist Heart Failure Core Measure Documentation  Assessment: Debra [name] has an EF documented as 35-40% on 12/27/17 by Echo.  Rationale: Heart failure patients with left ventricular systolic dysfunction (LVSD) and an EF < 40% should be prescribed an angiotensin converting enzyme inhibitor (ACEI) or angiotensin receptor blocker (ARB) at discharge unless a contraindication is documented in the medical record.  This patient is not currently on an ACEI or ARB for HF.  This note is being placed in the record in order to provide documentation that a contraindication to the use of these agents is present for this encounter.  ACE Inhibitor or Angiotensin Receptor Blocker is contraindicated (specify all that apply)  []   ACEI allergy AND ARB allergy []   Angioedema []   Moderate or severe aortic stenosis []   Hyperkalemia []   Hypotension []   Renal artery stenosis [x]   Worsening renal function, preexisting renal disease or dysfunction  Hildred Laser, PharmD Clinical Pharmacist Clinical phone from 8:30-4:00 is [phone] After 4pm, please call Main [facility] [phone]) for assistance. 12/28/2017 10:48 AM

## 2017-12-28 NOTE — Progress Notes (Signed)
Progress Note  Patient Name: Debra [name] Date of Encounter: 12/28/2017  Primary Cardiologist: Carlyle Dolly, MD   Subjective   Patient had a good day yesterday and feels well this am. No chest pain. While making rounds she developed Afib with RVR associated with SOB.   Inpatient Medications    Scheduled Meds: . amiodarone  150 mg Intravenous Once  . aspirin EC  81 mg Oral Daily  . atorvastatin  80 mg Oral q1800  . diphenhydrAMINE  25 mg Intravenous Once  . gabapentin  300 mg Oral TID  . hydroxychloroquine  400 mg Oral Daily  . insulin aspart  0-9 Units Subcutaneous TID WC  . insulin glargine  20 Units Subcutaneous QHS  . methylPREDNISolone (SOLU-MEDROL) injection  125 mg Intravenous Once  . metoprolol succinate  50 mg Oral Daily  . sodium chloride flush  3 mL Intravenous Q12H   Continuous Infusions: . sodium chloride 75.2 mL/hr at 12/28/17 0700  . sodium chloride    . sodium chloride 1 mL/kg/hr (12/28/17 0037)  . amiodarone 60 mg/hr (12/28/17 0815)   Followed by  . amiodarone    . heparin 900 Units/hr (12/28/17 0700)  . sodium chloride     PRN Meds: sodium chloride, acetaminophen, HYDROcodone-acetaminophen, nitroGLYCERIN, ondansetron (ZOFRAN) IV, sodium chloride flush, zolpidem   Vital Signs    Vitals:   12/28/17 0400 12/28/17 0430 12/28/17 0500 12/28/17 0600  BP: (!) 98/47 (!) 108/52 (!) 113/55 (!) 114/53  Pulse: 71 60 72 70  Resp: 17 17 19 17   Temp:      TempSrc:      SpO2: 100% 100% 100% 100%  Weight:    170 lb 3.1 oz (77.2 kg)  Height:        Intake/Output Summary (Last 24 hours) at 12/28/2017 0819 Last data filed at 12/28/2017 0700 Gross per 24 hour  Intake 1528.7 ml  Output 440 ml  Net 1088.7 ml   Filed Weights   12/26/17 1500 12/27/17 0500 12/28/17 0600  Weight: 163 lb 12.8 oz (74.3 kg) 165 lb 12.6 oz (75.2 kg) 170 lb 3.1 oz (77.2 kg)    Telemetry    NSR with frequent PACs. Run of SVT 20 beats. This am developed rapid Afib with  intermittent aberrancy.  - Personally Reviewed  ECG    NSR with PACs. ST depression in lateral leads. P- Personally Reviewed  Physical Exam  Elderly WF in NAD GEN: No acute distress.   Neck: No JVD Cardiac: IRRR, no murmurs, rubs, or gallops. Pulses 2+  Respiratory: Clear to auscultation bilaterally. GI: Soft, nontender, non-distended  MS: No edema; No deformity. Neuro:  Nonfocal  Psych: Normal affect   Labs    Chemistry Recent Labs  Lab 12/26/17 0845 12/27/17 0159 12/28/17 0334  NA 139 136 133*  K 4.3 4.3 4.1  CL 104 102 101  CO2 26 25 23   GLUCOSE 104* 268* 154*  BUN 22* 26* 32*  CREATININE 1.25* 1.86* 1.76*  CALCIUM 9.1 9.1 8.7*  GFRNONAA 38* 23* 25*  GFRAA 44* 27* 29*  ANIONGAP 9 9 9      Hematology Recent Labs  Lab 12/26/17 0845 12/28/17 0334  WBC 10.0 14.5*  RBC 3.93 3.72*  HGB 12.6 11.9*  HCT 39.8 37.2  MCV 101.3* 100.0  MCH 32.1 32.0  MCHC 31.7 32.0  RDW 12.6 12.5  PLT 225 220    Cardiac Enzymes Recent Labs  Lab 12/26/17 0845 12/26/17 1115  TROPONINI 1.29* 0.86*   No  results for input(s): TROPIPOC in the last 168 hours.   BNP Recent Labs  Lab 12/26/17 0845  BNP 1,003.0*     DDimer No results for input(s): DDIMER in the last 168 hours.   Radiology    Ct Angio Chest Pe W/cm &/or Wo Cm  Result Date: 12/26/2017 CLINICAL DATA:  84 year old female with shortness of breath since 12/23/2017. Subsequent encounter. EXAM: CT ANGIOGRAPHY CHEST WITH CONTRAST TECHNIQUE: Multidetector CT imaging of the chest was performed using the standard protocol during bolus administration of intravenous contrast. Multiplanar CT image reconstructions and MIPs were obtained to evaluate the vascular anatomy. CONTRAST:  33mL ISOVUE-370 IOPAMIDOL (ISOVUE-370) INJECTION 76% COMPARISON:  None. FINDINGS: Cardiovascular:  No pulmonary embolus. Heart slightly enlarged.  Coronary artery calcifications. Calcified plaque throughout the thoracic aorta and great vessels. No  aortic dissection. Ascending thoracic aorta measures up to 3.5 cm. Post stenting renal arteries bilaterally. Mediastinum/Nodes: Small slightly matted partially calcified top-normal size right hilar lymph nodes. Small hiatal hernia. Lungs/Pleura: Postsurgical changes right upper lobe with peribronchial thickening. No comparisons to help establish stability in this region. Left upper lobe 9 x 7 x 4 mm nodule (series 6, image 26 and series 8, image 94). Peripheral left upper lobe 5 x 3 mm nodule (series 6, image 34). Calcified granuloma right upper lobe and right lung base. Small right-sided pleural effusion. Cluster of small nodules superior aspect left lower lobe (series 6 images 49 through 53). Upper Abdomen: Left renal cyst.  Post cholecystectomy. Musculoskeletal: Scoliosis thoracic spine without osseous destructive lesion. Review of the MIP images confirms the above findings. IMPRESSION: No pulmonary embolus detected. Small right-sided pleural effusion of indeterminate etiology. Postsurgical changes right upper lobe with peribronchial thickening. Stability cannot be confirmed as there are no comparison CT exams. Scattered pulmonary nodules largest left upper lobe measuring 9 x 7 x 4 mm (series 6, image 26 and series 8, image 94). Consider one of the following in 3 months for both low-risk and high-risk individuals: (a) repeat chest CT, (b) follow-up PET-CT, or (c) tissue sampling. This recommendation follows the consensus statement: Guidelines for Management of Incidental Pulmonary Nodules Detected on CT Images: From the Fleischner Society 2017; Radiology 2017; 284:228-243. Cardiomegaly with coronary artery calcifications. Aortic Atherosclerosis (ICD10-I70.0). Electronically Signed   By: Genia Del M.D.   On: 12/26/2017 12:49    Cardiac Studies   Echo:    Study Conclusions  - Left ventricle: The cavity size was normal. Wall thickness was   normal. Systolic function was moderately reduced. The  estimated   ejection fraction was in the range of 35% to 40%. Akinesis of the   basal-midanteroseptal myocardium. Features are consistent with a   pseudonormal left ventricular filling pattern, with concomitant   abnormal relaxation and increased filling pressure (grade 2   diastolic dysfunction). - Aortic valve: Valve area (VTI): 2.3 cm^2. Valve area (Vmax): 2.25   cm^2. Valve area (Vmean): 1.91 cm^2. - Mitral valve: Valve area by continuity equation (using LVOT   flow): 1.38 cm^2. - Left atrium: The atrium was mildly dilated. - Right ventricle: Systolic function was mildly reduced.   Patient Profile     84 y.o. female with history of remote CABG in 1997 in [place] presented to [facility] with acute SOB. When receiving inhaler therapy she developed SVT and then Afib. She was placed on IV amiodarone. Troponin positive up to 1.29. CT chest negative for PE. Transferred for cardiac cath. Limited Echo suggested EF 40% but done during  Afib with RVR.   Assessment & Plan    1. Acute respiratory failure. Possibly due to COPD exacerbation versus CHF. Appears euvolemic today. Diuresis on hold due to  CKD. EF 35-40% by Echo.  2. NSTEMI. Elevated troponin and lateral ST depression noted on Ecg. 22 years post CABG. Will continue IV heparin, ASA, statin. On Toprol XL. Plan cardiac cath today post gentle hydration this morning. Concerned that creatinine has increased from 1.25 to 1.86 post chest CT but improved to 1.76 this am which is  at her baseline.  3. Acute on chronic systolic CHF.  May not be a good candidate for ARB/ACEi due to CKD. On toprol XL. Further therapy pending cardiac cath results. 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 Baseline creatinine 1.7-1.8 but has been as high as 2.14.  5. Afib with RVR. Recurrence this am.  Will reload with IV amiodarone. May not be the best choice for AAD due to hyperthyroidism but options limited due to CHF/CAD.  6. History of hyperthyroidism. TSH low and Free T4 a  little high. S/p thyroidectomy in past. Will continue beta blocker for now. Will need close follow up on amiodarone. May need to consider methimazole.  7. IDDM will resume lantus and cover with SSI. A1c 5.9%.  8. HLD. LDL 38. On high dose statin.   For questions or updates, please contact [place] Please consult [email or web address] for contact info under Cardiology/STEMI.      Signed, Peter Martinique, MD  12/28/2017, 8:19 AM

## 2017-12-28 NOTE — Progress Notes (Signed)
Heart rate has returned to range of 75-85 bpm. Patient reports no longer feeling short of breath at this time.

## 2017-12-28 NOTE — Progress Notes (Signed)
Pt put on call light for assistance d/t feeling short of breath. O2 sat 100% on 4L N/C, lung sounds clear. Heart rate 116 and irregular on monitor, sinus tachycardia with frequent PAC's noted. Medication changes on day shift included discontinuation of amiodarone drip and new order for Toprol XL 50 mg, which is patient's home dose. First dose of Toprol was held on day shift d/t blood pressure of 100/45. Phlebotomist was called to draw morning labs for electrolyte levels, charge nurse updated on situation. Heart rate decreasing, below 100 bpm by 0316. As heart rate drops, shortness of breath improves.

## 2017-12-28 NOTE — H&P (View-Only) (Signed)
Progress Note  Patient Name: Debra [name] Date of Encounter: 12/28/2017  Primary Cardiologist: Carlyle Dolly, MD   Subjective   Patient had a good day yesterday and feels well this am. No chest pain. While making rounds she developed Afib with RVR associated with SOB.   Inpatient Medications    Scheduled Meds: . amiodarone  150 mg Intravenous Once  . aspirin EC  81 mg Oral Daily  . atorvastatin  80 mg Oral q1800  . diphenhydrAMINE  25 mg Intravenous Once  . gabapentin  300 mg Oral TID  . hydroxychloroquine  400 mg Oral Daily  . insulin aspart  0-9 Units Subcutaneous TID WC  . insulin glargine  20 Units Subcutaneous QHS  . methylPREDNISolone (SOLU-MEDROL) injection  125 mg Intravenous Once  . metoprolol succinate  50 mg Oral Daily  . sodium chloride flush  3 mL Intravenous Q12H   Continuous Infusions: . sodium chloride 75.2 mL/hr at 12/28/17 0700  . sodium chloride    . sodium chloride 1 mL/kg/hr (12/28/17 0037)  . amiodarone 60 mg/hr (12/28/17 0815)   Followed by  . amiodarone    . heparin 900 Units/hr (12/28/17 0700)  . sodium chloride     PRN Meds: sodium chloride, acetaminophen, HYDROcodone-acetaminophen, nitroGLYCERIN, ondansetron (ZOFRAN) IV, sodium chloride flush, zolpidem   Vital Signs    Vitals:   12/28/17 0400 12/28/17 0430 12/28/17 0500 12/28/17 0600  BP: (!) 98/47 (!) 108/52 (!) 113/55 (!) 114/53  Pulse: 71 60 72 70  Resp: 17 17 19 17   Temp:      TempSrc:      SpO2: 100% 100% 100% 100%  Weight:    170 lb 3.1 oz (77.2 kg)  Height:        Intake/Output Summary (Last 24 hours) at 12/28/2017 0819 Last data filed at 12/28/2017 0700 Gross per 24 hour  Intake 1528.7 ml  Output 440 ml  Net 1088.7 ml   Filed Weights   12/26/17 1500 12/27/17 0500 12/28/17 0600  Weight: 163 lb 12.8 oz (74.3 kg) 165 lb 12.6 oz (75.2 kg) 170 lb 3.1 oz (77.2 kg)    Telemetry    NSR with frequent PACs. Run of SVT 20 beats. This am developed rapid Afib with  intermittent aberrancy.  - Personally Reviewed  ECG    NSR with PACs. ST depression in lateral leads. P- Personally Reviewed  Physical Exam  Elderly WF in NAD GEN: No acute distress.   Neck: No JVD Cardiac: IRRR, no murmurs, rubs, or gallops. Pulses 2+  Respiratory: Clear to auscultation bilaterally. GI: Soft, nontender, non-distended  MS: No edema; No deformity. Neuro:  Nonfocal  Psych: Normal affect   Labs    Chemistry Recent Labs  Lab 12/26/17 0845 12/27/17 0159 12/28/17 0334  NA 139 136 133*  K 4.3 4.3 4.1  CL 104 102 101  CO2 26 25 23   GLUCOSE 104* 268* 154*  BUN 22* 26* 32*  CREATININE 1.25* 1.86* 1.76*  CALCIUM 9.1 9.1 8.7*  GFRNONAA 38* 23* 25*  GFRAA 44* 27* 29*  ANIONGAP 9 9 9      Hematology Recent Labs  Lab 12/26/17 0845 12/28/17 0334  WBC 10.0 14.5*  RBC 3.93 3.72*  HGB 12.6 11.9*  HCT 39.8 37.2  MCV 101.3* 100.0  MCH 32.1 32.0  MCHC 31.7 32.0  RDW 12.6 12.5  PLT 225 220    Cardiac Enzymes Recent Labs  Lab 12/26/17 0845 12/26/17 1115  TROPONINI 1.29* 0.86*   No  results for input(s): TROPIPOC in the last 168 hours.   BNP Recent Labs  Lab 12/26/17 0845  BNP 1,003.0*     DDimer No results for input(s): DDIMER in the last 168 hours.   Radiology    Ct Angio Chest Pe W/cm &/or Wo Cm  Result Date: 12/26/2017 CLINICAL DATA:  84 year old female with shortness of breath since 12/23/2017. Subsequent encounter. EXAM: CT ANGIOGRAPHY CHEST WITH CONTRAST TECHNIQUE: Multidetector CT imaging of the chest was performed using the standard protocol during bolus administration of intravenous contrast. Multiplanar CT image reconstructions and MIPs were obtained to evaluate the vascular anatomy. CONTRAST:  71mL ISOVUE-370 IOPAMIDOL (ISOVUE-370) INJECTION 76% COMPARISON:  None. FINDINGS: Cardiovascular:  No pulmonary embolus. Heart slightly enlarged.  Coronary artery calcifications. Calcified plaque throughout the thoracic aorta and great vessels. No  aortic dissection. Ascending thoracic aorta measures up to 3.5 cm. Post stenting renal arteries bilaterally. Mediastinum/Nodes: Small slightly matted partially calcified top-normal size right hilar lymph nodes. Small hiatal hernia. Lungs/Pleura: Postsurgical changes right upper lobe with peribronchial thickening. No comparisons to help establish stability in this region. Left upper lobe 9 x 7 x 4 mm nodule (series 6, image 26 and series 8, image 94). Peripheral left upper lobe 5 x 3 mm nodule (series 6, image 34). Calcified granuloma right upper lobe and right lung base. Small right-sided pleural effusion. Cluster of small nodules superior aspect left lower lobe (series 6 images 49 through 53). Upper Abdomen: Left renal cyst.  Post cholecystectomy. Musculoskeletal: Scoliosis thoracic spine without osseous destructive lesion. Review of the MIP images confirms the above findings. IMPRESSION: No pulmonary embolus detected. Small right-sided pleural effusion of indeterminate etiology. Postsurgical changes right upper lobe with peribronchial thickening. Stability cannot be confirmed as there are no comparison CT exams. Scattered pulmonary nodules largest left upper lobe measuring 9 x 7 x 4 mm (series 6, image 26 and series 8, image 94). Consider one of the following in 3 months for both low-risk and high-risk individuals: (a) repeat chest CT, (b) follow-up PET-CT, or (c) tissue sampling. This recommendation follows the consensus statement: Guidelines for Management of Incidental Pulmonary Nodules Detected on CT Images: From the Fleischner Society 2017; Radiology 2017; 284:228-243. Cardiomegaly with coronary artery calcifications. Aortic Atherosclerosis (ICD10-I70.0). Electronically Signed   By: Genia Del M.D.   On: 12/26/2017 12:49    Cardiac Studies   Echo:    Study Conclusions  - Left ventricle: The cavity size was normal. Wall thickness was   normal. Systolic function was moderately reduced. The  estimated   ejection fraction was in the range of 35% to 40%. Akinesis of the   basal-midanteroseptal myocardium. Features are consistent with a   pseudonormal left ventricular filling pattern, with concomitant   abnormal relaxation and increased filling pressure (grade 2   diastolic dysfunction). - Aortic valve: Valve area (VTI): 2.3 cm^2. Valve area (Vmax): 2.25   cm^2. Valve area (Vmean): 1.91 cm^2. - Mitral valve: Valve area by continuity equation (using LVOT   flow): 1.38 cm^2. - Left atrium: The atrium was mildly dilated. - Right ventricle: Systolic function was mildly reduced.   Patient Profile     84 y.o. female with history of remote CABG in 1997 in [place] presented to [facility] with acute SOB. When receiving inhaler therapy she developed SVT and then Afib. She was placed on IV amiodarone. Troponin positive up to 1.29. CT chest negative for PE. Transferred for cardiac cath. Limited Echo suggested EF 40% but done during  Afib with RVR.   Assessment & Plan    1. Acute respiratory failure. Possibly due to COPD exacerbation versus CHF. Appears euvolemic today. Diuresis on hold due to  CKD. EF 35-40% by Echo.  2. NSTEMI. Elevated troponin and lateral ST depression noted on Ecg. 22 years post CABG. Will continue IV heparin, ASA, statin. On Toprol XL. Plan cardiac cath today post gentle hydration this morning. Concerned that creatinine has increased from 1.25 to 1.86 post chest CT but improved to 1.76 this am which is  at her baseline.  3. Acute on chronic systolic CHF.  May not be a good candidate for ARB/ACEi due to CKD. On toprol XL. Further therapy pending cardiac cath results. 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 Baseline creatinine 1.7-1.8 but has been as high as 2.14.  5. Afib with RVR. Recurrence this am.  Will reload with IV amiodarone. May not be the best choice for AAD due to hyperthyroidism but options limited due to CHF/CAD.  6. History of hyperthyroidism. TSH low and Free T4 a  little high. S/p thyroidectomy in past. Will continue beta blocker for now. Will need close follow up on amiodarone. May need to consider methimazole.  7. IDDM will resume lantus and cover with SSI. A1c 5.9%.  8. HLD. LDL 38. On high dose statin.   For questions or updates, please contact [place] Please consult [email or web address] for contact info under Cardiology/STEMI.      Signed, Karliah Kowalchuk Martin[place], MD  12/28/2017, 8:19 AM

## 2017-12-29 ENCOUNTER — Encounter (HOSPITAL_COMMUNITY): Payer: Self-pay | Admitting: Cardiology

## 2017-12-29 DIAGNOSIS — J9601 Acute respiratory failure with hypoxia: Secondary | ICD-10-CM

## 2017-12-29 LAB — GLUCOSE, CAPILLARY
GLUCOSE-CAPILLARY: 144 mg/dL — AB (ref 70–99)
GLUCOSE-CAPILLARY: 128 mg/dL — AB (ref 70–99)
Glucose-Capillary: 167 mg/dL — ABNORMAL HIGH (ref 70–99)
Glucose-Capillary: 233 mg/dL — ABNORMAL HIGH (ref 70–99)

## 2017-12-29 LAB — BASIC METABOLIC PANEL
ANION GAP: 10 (ref 5–15)
BUN: 37 mg/dL — ABNORMAL HIGH (ref 8–23)
CALCIUM: 8.9 mg/dL (ref 8.9–10.3)
CO2: 27 mmol/L (ref 22–32)
Chloride: 101 mmol/L (ref 98–111)
Creatinine, Ser: 1.94 mg/dL — ABNORMAL HIGH (ref 0.44–1.00)
GFR calc non Af Amer: 22 mL/min — ABNORMAL LOW (ref >60)
GFR, EST AFRICAN AMERICAN: 26 mL/min — AB (ref >60)
Glucose, Bld: 178 mg/dL — ABNORMAL HIGH (ref 70–99)
Potassium: 4.4 mmol/L (ref 3.5–5.1)
Sodium: 138 mmol/L (ref 135–145)

## 2017-12-29 LAB — CBC
HCT: 35.6 % — ABNORMAL LOW (ref 36.0–46.0)
HEMOGLOBIN: 11.3 g/dL — AB (ref 12.0–15.0)
MCH: 31.7 pg (ref 26.0–34.0)
MCHC: 31.7 g/dL (ref 30.0–36.0)
MCV: 99.7 fL (ref 78.0–100.0)
Platelets: 223 10*3/uL (ref 150–400)
RBC: 3.57 MIL/uL — AB (ref 3.87–5.11)
RDW: 12.3 % (ref 11.5–15.5)
WBC: 12.4 10*3/uL — ABNORMAL HIGH (ref 4.0–10.5)

## 2017-12-29 MED ORDER — AMIODARONE HCL 200 MG PO TABS
400.0000 mg | ORAL_TABLET | Freq: Two times a day (BID) | ORAL | Status: DC
  Administered 2017-12-29 – 2017-12-31 (×5): 400 mg via ORAL
  Filled 2017-12-29 (×5): qty 2

## 2017-12-29 MED ORDER — APIXABAN 2.5 MG PO TABS
2.5000 mg | ORAL_TABLET | Freq: Two times a day (BID) | ORAL | Status: DC
  Administered 2017-12-29 – 2017-12-31 (×5): 2.5 mg via ORAL
  Filled 2017-12-29 (×5): qty 1

## 2017-12-29 MED ORDER — FUROSEMIDE 40 MG PO TABS
40.0000 mg | ORAL_TABLET | Freq: Every day | ORAL | Status: DC
  Administered 2017-12-29 – 2017-12-31 (×3): 40 mg via ORAL
  Filled 2017-12-29 (×3): qty 1

## 2017-12-29 MED FILL — Heparin Sod (Porcine)-NaCl IV Soln 1000 Unit/500ML-0.9%: INTRAVENOUS | Qty: 1000 | Status: AC

## 2017-12-29 NOTE — Evaluation (Signed)
Physical Therapy Evaluation Patient Details Name: Debra [name] MRN: [id number] DOB: 03/09/1929 Today's Date: 12/29/2017   History of Present Illness  84 yo admitted with CHF and NSTEMI. PMHx: CABG, COPD, DM, neuropathy, gout, HTN, renal disorder  Clinical Impression  Pt very pleasant and willing to mobilize. Pt reports she is relatively active driving, caring for herself and able to walk through the grocery store with assist of cart. Daughter in [place] states she is trying to get pt to move from [facility] to [facility] Pt with decreased balance, gait and activity tolerance who will benefit from acute therapy to maximize mobility. If pt returns to [place] she will need to perform stairs. Pt encouraged to mobilize with nursing daily.   HR 62-65 SpO2 98% on RA BP 101/48    Follow Up Recommendations Home health PT;Supervision for mobility/OOB    Equipment Recommendations  None recommended by PT    Recommendations for Other Services       Precautions / Restrictions Precautions Precautions: Fall      Mobility  Bed Mobility               General bed mobility comments: in chair on arrival  Transfers Overall transfer level: Needs assistance   Transfers: Sit to/from Stand Sit to Stand: Supervision         General transfer comment: supervision for safety  Ambulation/Gait Ambulation/Gait assistance: Min guard Gait Distance (Feet): 80 Feet Assistive device: Rolling walker (2 wheeled);None Gait Pattern/deviations: Step-through pattern;Decreased stride length   Gait velocity interpretation: <1.8 ft/sec, indicate of risk for recurrent falls General Gait Details: pt initiated gait without AD with LOB to right x 3, utilized HHA to simulate cane but pt continued to have Right posterior LOB Walked 41' then returned to chair. After seated rest pt walked additional 44' with Rw with good stability and no noted LOB but fatigued and unable to walk further  Stairs            Wheelchair  Mobility    Modified Rankin (Stroke Patients Only)       Balance Overall balance assessment: Needs assistance   Sitting balance-Leahy Scale: Good       Standing balance-Leahy Scale: Fair                               Pertinent Vitals/Pain Pain Assessment: No/denies pain    Home Living Family/patient expects to be discharged to:: Private residence Living Arrangements: Children Available Help at Discharge: Family;Available 24 hours/day Type of Home: House Home Access: Ramped entrance     Home Layout: One level Home Equipment: Walker - 2 wheels;Shower seat;Cane - single point      Prior Function Level of Independence: Independent with assistive device(s)         Comments: pt normally walks with a cane, does laundry, drives     Hand Dominance        Extremity/Trunk Assessment   Upper Extremity Assessment Upper Extremity Assessment: Generalized weakness    Lower Extremity Assessment Lower Extremity Assessment: Generalized weakness(3/5 knee extension and flexion, 5/5 hip flexion)    Cervical / Trunk Assessment Cervical / Trunk Assessment: Kyphotic  Communication      Cognition Arousal/Alertness: Awake/alert Behavior During Therapy: WFL for tasks assessed/performed Overall Cognitive Status: Within Functional Limits for tasks assessed  General Comments      Exercises     Assessment/Plan    PT Assessment Patient needs continued PT services  PT Problem List Decreased mobility;Decreased activity tolerance;Decreased balance;Decreased knowledge of use of DME;Decreased strength;Decreased safety awareness       PT Treatment Interventions Gait training;Therapeutic exercise;Patient/family education;Balance training;Functional mobility training;Neuromuscular re-education;DME instruction;Therapeutic activities    PT Goals (Current goals can be found in the Care Plan section)  Acute Rehab  PT Goals Patient Stated Goal: return home PT Goal Formulation: With patient Time For Goal Achievement: 01/12/18 Potential to Achieve Goals: Good    Frequency Min 3X/week   Barriers to discharge        Co-evaluation               AM-PAC PT "6 Clicks" Daily Activity  Outcome Measure Difficulty turning over in bed (including adjusting bedclothes, sheets and blankets)?: A Little Difficulty moving from lying on back to sitting on the side of the bed? : A Little Difficulty sitting down on and standing up from a chair with arms (e.g., wheelchair, bedside commode, etc,.)?: A Little Help needed moving to and from a bed to chair (including a wheelchair)?: A Little Help needed walking in hospital room?: A Little Help needed climbing 3-5 steps with a railing? : A Little 6 Click Score: 18    End of Session Equipment Utilized During Treatment: Gait belt Activity Tolerance: Patient tolerated treatment well Patient left: in chair;with call bell/phone within [name];with family/visitor present Nurse Communication: Mobility status PT Visit Diagnosis: Other abnormalities of gait and mobility (R26.89);Muscle weakness (generalized) (M62.81)    Time: 1325-1340 PT Time Calculation (min) (ACUTE ONLY): 15 min   Charges:   PT Evaluation $PT Eval Moderate Complexity: 1 Mod     PT G Codes:        Debra [name], PT [phone]   Debra [name] 12/29/2017, 1:57 PM

## 2017-12-29 NOTE — Progress Notes (Signed)
1330-1410 Observed pt walking with PT. Gave pt MI and CHF booklets. Reviewed MI restrictions, NTG use, signs/symptoms of CHF, low sodium diet with 2000 mg restrictions and importance of daily weights. Pt attended CRP 2 after CABG in 1997. Will refer to [facility] in [facility]. If pt remains in [place] can change referral to here. Sounds as if pt will stay in [place] a couple of weeks before returning. Will see tomorrow for ambulation. Graylon Good RN BSN 12/29/2017 2:10 PM

## 2017-12-29 NOTE — Progress Notes (Signed)
ANTICOAGULATION CONSULT NOTE - Follow Up Consult  Pharmacy Consult for apixaban Indication: afib  Allergies  Allergen Reactions  . Ciprofloxacin Rash and Itching  . Iodinated Diagnostic Agents Rash    Patient Measurements: Height: 5\' 7"  (170.2 cm) Weight: 165 lb 9.1 oz (75.1 kg) IBW/kg (Calculated) : 61.6 Heparin Dosing Weight: 74.3 kg  Vital Signs: Temp: 98.6 F (37 C) (06/27 0729) Temp Source: Oral (06/27 0729) BP: 175/160 (06/27 0700) Pulse Rate: 56 (06/27 0700)  Labs: Recent Labs    12/26/17 0845 12/26/17 1115 12/26/17 1801 12/27/17 0159 12/28/17 0334 12/29/17 0251  HGB 12.6  --   --   --  11.9* 11.3*  HCT 39.8  --   --   --  37.2 35.6*  PLT 225  --   --   --  220 223  HEPARINUNFRC  --   --  0.57 0.45 0.34  --   CREATININE 1.25*  --   --  1.86* 1.76* 1.94*  TROPONINI 1.29* 0.86*  --   --   --   --     Estimated Creatinine Clearance: 22 mL/min (A) (by C-G formula based on SCr of 1.94 mg/dL (H)).   Medications:  Infusions:  . sodium chloride 10 mL/hr at 12/28/17 2000    Assessment: 84 year old female with NSTEMI and cath with plans for medical management. She is also noted with afib to begin apixaban -hg= 11.3 -SCr= 1.94 (recent range has been 1.25- 2.14)  Goal of Therapy:  Heparin level 0.3-0.7 units/ml Monitor platelets by anticoagulation protocol: Yes   Plan:  -apixaban 2.5mg  po bid -Will need to watch SCr closely -Will provide patient education  Hildred Laser, PharmD Clinical Pharmacist Please check Amion for pharmacy contact number

## 2017-12-29 NOTE — Progress Notes (Signed)
This patient has filled out AD forms and needs to have notarized on Friday morning. Conard Novak, Chaplain   12/29/17 2000  Clinical Encounter Type  Visited With Patient  Visit Type Follow-up  Referral From Chaplain  Consult/Referral To Chaplain  Stress Factors  Patient Stress Factors  (AD ready for Notary)

## 2017-12-29 NOTE — Progress Notes (Signed)
Progress Note  Patient Name: Debra [name] Date of Encounter: 12/29/2017  Primary Cardiologist: Carlyle Dolly, MD   Subjective   Patient sound asleep this am. Received ambien last night and is very sleep and difficult to arouse.   Inpatient Medications    Scheduled Meds: . amiodarone  400 mg Oral BID  . atorvastatin  80 mg Oral q1800  . furosemide  40 mg Oral Daily  . gabapentin  300 mg Oral TID  . hydroxychloroquine  400 mg Oral Daily  . insulin aspart  0-9 Units Subcutaneous TID WC  . insulin glargine  20 Units Subcutaneous QHS  . metoprolol succinate  50 mg Oral Daily  . sodium chloride flush  3 mL Intravenous Q12H   Continuous Infusions: . sodium chloride 10 mL/hr at 12/28/17 2000   PRN Meds: sodium chloride, acetaminophen, HYDROcodone-acetaminophen, nitroGLYCERIN, ondansetron (ZOFRAN) IV, sodium chloride flush, zolpidem   Vital Signs    Vitals:   12/29/17 0500 12/29/17 0600 12/29/17 0700 12/29/17 0729  BP:   (!) 175/160   Pulse: (!) 57 69 (!) 56   Resp: (!) 23 20 20    Temp:    98.6 F (37 C)  TempSrc:    Oral  SpO2: 97% 100% 98%   Weight: 165 lb 9.1 oz (75.1 kg)     Height:        Intake/Output Summary (Last 24 hours) at 12/29/2017 0749 Last data filed at 12/29/2017 0700 Gross per 24 hour  Intake 771.03 ml  Output 2950 ml  Net -2178.97 ml   Filed Weights   12/27/17 0500 12/28/17 0600 12/29/17 0500  Weight: 165 lb 12.6 oz (75.2 kg) 170 lb 3.1 oz (77.2 kg) 165 lb 9.1 oz (75.1 kg)    Telemetry    NSR with frequent PACs. No further Afib.    - Personally Reviewed  ECG    None today  Physical Exam  Elderly WF in NAD, asleep. GEN: No acute distress.   Neck: No JVD Cardiac: RRR, no murmurs, rubs, or gallops. Pulses 2+  Respiratory: Clear to auscultation bilaterally. Breathing is nonlabored. GI: Soft, nontender, non-distended  MS: No edema; No deformity. There is a mild soft right groin hematoma. Good pulse. No left groin hematoma.  Neuro:   Nonfocal  Psych: Normal affect   Labs    Chemistry Recent Labs  Lab 12/27/17 0159 12/28/17 0334 12/29/17 0251  NA 136 133* 138  K 4.3 4.1 4.4  CL 102 101 101  CO2 25 23 27   GLUCOSE 268* 154* 178*  BUN 26* 32* 37*  CREATININE 1.86* 1.76* 1.94*  CALCIUM 9.1 8.7* 8.9  GFRNONAA 23* 25* 22*  GFRAA 27* 29* 26*  ANIONGAP 9 9 10      Hematology Recent Labs  Lab 12/26/17 0845 12/28/17 0334 12/29/17 0251  WBC 10.0 14.5* 12.4*  RBC 3.93 3.72* 3.57*  HGB 12.6 11.9* 11.3*  HCT 39.8 37.2 35.6*  MCV 101.3* 100.0 99.7  MCH 32.1 32.0 31.7  MCHC 31.7 32.0 31.7  RDW 12.6 12.5 12.3  PLT 225 220 223    Cardiac Enzymes Recent Labs  Lab 12/26/17 0845 12/26/17 1115  TROPONINI 1.29* 0.86*   No results for input(s): TROPIPOC in the last 168 hours.   BNP Recent Labs  Lab 12/26/17 0845  BNP 1,003.0*     DDimer No results for input(s): DDIMER in the last 168 hours.   Radiology    No results found.  Cardiac Studies   Echo:  Study Conclusions  - Left ventricle: The cavity size was normal. Wall thickness was   normal. Systolic function was moderately reduced. The estimated   ejection fraction was in the range of 35% to 40%. Akinesis of the   basal-midanteroseptal myocardium. Features are consistent with a   pseudonormal left ventricular filling pattern, with concomitant   abnormal relaxation and increased filling pressure (grade 2   diastolic dysfunction). - Aortic valve: Valve area (VTI): 2.3 cm^2. Valve area (Vmax): 2.25   cm^2. Valve area (Vmean): 1.91 cm^2. - Mitral valve: Valve area by continuity equation (using LVOT   flow): 1.38 cm^2. - Left atrium: The atrium was mildly dilated. - Right ventricle: Systolic function was mildly reduced.  Cardiac cath: Procedures   LEFT HEART CATH AND CORS/GRAFTS ANGIOGRAPHY  Conclusion     Ost LAD to Prox LAD lesion is 100% stenosed.  Ost 1st Mrg to 1st Mrg lesion is 40% stenosed.  Ost 2nd Mrg lesion is 30%  stenosed.  Ost 3rd Mrg lesion is 90% stenosed.  Ost RCA to Dist RCA lesion is 100% stenosed.  LIMA graft was visualized by angiography and is normal in caliber.  The graft exhibits no disease.  SVG graft was visualized by angiography.  Origin to Prox Graft lesion is 100% stenosed.  SVG graft was not visualized due to known occlusion.  Origin to Prox Graft lesion is 100% stenosed.  LV end diastolic pressure is moderately elevated.   1. 3 vessel occlusive CAD    - 100% proximal LAD    - 90% small OM3    - 100% proximal RCA. RCA fills distally by left to right collaterals. 2. Patent LIMA to the LAD 3. Occluded SVG presumably to OM 4. No graft visualized to RCA but vessel fills by collaterals.  5. Moderately elevated LVEDP.  Plan: medical management. Will optimize CHF therapy. Anticipate starting Eliquis in am if no bleeding problems.      Patient Profile     84 y.o. female with history of remote CABG in 1997 in [place] presented to [facility] with acute SOB. When receiving inhaler therapy she developed SVT and then Afib. She was placed on IV amiodarone. Troponin positive up to 1.29. CT chest negative for PE. Transferred for cardiac cath. Limited Echo suggested EF 40% but done during Afib with RVR.   Assessment & Plan    1. Acute respiratory failure. Secondary to acute CHF. EF 35-40% by Echo.  2. NSTEMI. Elevated troponin and lateral ST depression noted on Ecg. 22 years post CABG. On Toprol XL. No angina. Cardiac cath shows patent LIMA to the LAD. SVGs presumably to OM and RCA are occluded. RCA fills by collaterals. Native LCx is patent except for distal branch. Plan medical therapy. 3. Acute on chronic systolic CHF.  May not be a good candidate for ARB/ACEi due to CKD. On toprol XL. She had good response to IV lasix yesterday. In no respiratory distress today. Will switch IV lasix to po. Monitor renal function closely.  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 Baseline creatinine  1.7-1.8 but has been as high as 2.14. 1.97 today post diuresis and cardiac cath. Monitor.  5. Afib with RVR. Recurrence yesterday.  Was reloaded with IV amiodarone with return to NSR. Will transition amiodarone to po today. 6. Thyroid disease. Patient is s/p thyroidectomy and I confirmed with daughter that she has been on long term synthroid 125 micrograms daily. TSH low on admission indicating too high a replacement dose. Will reduce  to lower dose on discharge.  7. 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 A1c 5.9%.  8. HLD. LDL 38. On high dose statin. 9. PAD s/p bilateral iliac stents. Unable to pass catheter through right iliac stent. Will need LE arterial dopplers as outpatient.    For questions or updates, please contact [place] Please consult [email or web address] for contact info under Cardiology/STEMI.      Signed, Renne Platts Martinique, MD  12/29/2017, 7:49 AM

## 2017-12-30 DIAGNOSIS — I5023 Acute on chronic systolic (congestive) heart failure: Secondary | ICD-10-CM | POA: Diagnosis present

## 2017-12-30 DIAGNOSIS — I4891 Unspecified atrial fibrillation: Secondary | ICD-10-CM | POA: Diagnosis present

## 2017-12-30 LAB — GLUCOSE, CAPILLARY
GLUCOSE-CAPILLARY: 114 mg/dL — AB (ref 70–99)
Glucose-Capillary: 81 mg/dL (ref 70–99)
Glucose-Capillary: 129 mg/dL — ABNORMAL HIGH (ref 70–99)
Glucose-Capillary: 210 mg/dL — ABNORMAL HIGH (ref 70–99)

## 2017-12-30 LAB — BASIC METABOLIC PANEL
Anion gap: 8 (ref 5–15)
BUN: 44 mg/dL — AB (ref 8–23)
CHLORIDE: 105 mmol/L (ref 98–111)
CO2: 27 mmol/L (ref 22–32)
Calcium: 8.9 mg/dL (ref 8.9–10.3)
Creatinine, Ser: 2.07 mg/dL — ABNORMAL HIGH (ref 0.44–1.00)
GFR calc Af Amer: 24 mL/min — ABNORMAL LOW (ref >60)
GFR calc non Af Amer: 21 mL/min — ABNORMAL LOW (ref >60)
GLUCOSE: 77 mg/dL (ref 70–99)
POTASSIUM: 3.3 mmol/L — AB (ref 3.5–5.1)
SODIUM: 140 mmol/L (ref 135–145)

## 2017-12-30 MED ORDER — GUAIFENESIN-DM 100-10 MG/5ML PO SYRP
5.0000 mL | ORAL_SOLUTION | ORAL | Status: DC | PRN
Start: 2017-12-30 — End: 2017-12-31
  Filled 2017-12-30: qty 5

## 2017-12-30 MED ORDER — POTASSIUM CHLORIDE CRYS ER 20 MEQ PO TBCR
20.0000 meq | EXTENDED_RELEASE_TABLET | Freq: Once | ORAL | Status: AC
  Administered 2017-12-30: 20 meq via ORAL

## 2017-12-30 NOTE — Progress Notes (Signed)
Progress Note  Patient Name: Debra [name] Date of Encounter: 12/30/2017  Primary Cardiologist: Carlyle Dolly, MD   Subjective   Patient feels very well this am. Denies any chest pain, dyspnea, or palpitations.   Inpatient Medications    Scheduled Meds: . amiodarone  400 mg Oral BID  . apixaban  2.5 mg Oral BID  . atorvastatin  80 mg Oral q1800  . furosemide  40 mg Oral Daily  . gabapentin  300 mg Oral TID  . hydroxychloroquine  400 mg Oral Daily  . insulin aspart  0-9 Units Subcutaneous TID WC  . insulin glargine  20 Units Subcutaneous QHS  . metoprolol succinate  50 mg Oral Daily  . sodium chloride flush  3 mL Intravenous Q12H   Continuous Infusions: . sodium chloride 10 mL/hr at 12/28/17 2000   PRN Meds: sodium chloride, acetaminophen, HYDROcodone-acetaminophen, nitroGLYCERIN, ondansetron (ZOFRAN) IV, sodium chloride flush   Vital Signs    Vitals:   12/29/17 2004 12/30/17 0000 12/30/17 0434 12/30/17 0755  BP: 119/75 133/76 (!) 117/58 (!) 103/49  Pulse: (!) 53 (!) 52 (!) 52 (!) 47  Resp: 18 18 18    Temp: 97.6 F (36.4 C) 98.2 F (36.8 C) 97.6 F (36.4 C) 98 F (36.7 C)  TempSrc: Oral Oral Oral Oral  SpO2: 100% 100% 100% 100%  Weight:   162 lb 14.4 oz (73.9 kg)   Height:        Intake/Output Summary (Last 24 hours) at 12/30/2017 0857 Last data filed at 12/29/2017 2215 Gross per 24 hour  Intake 491.19 ml  Output 600 ml  Net -108.81 ml   Filed Weights   12/28/17 0600 12/29/17 0500 12/30/17 0434  Weight: 170 lb 3.1 oz (77.2 kg) 165 lb 9.1 oz (75.1 kg) 162 lb 14.4 oz (73.9 kg)    Telemetry    NSR  No further Afib.    - Personally Reviewed  ECG    None today  Physical Exam   GEN: Elderly EF No acute distress.   Neck: No JVD Cardiac: RRR, no murmurs, rubs, or gallops. Pulses 2+  Respiratory: Clear to auscultation bilaterally. Breathing is nonlabored. GI: Soft, nontender, non-distended  MS: No edema; No deformity. There is a mild soft right  groin hematoma with bruising. Good pulse. No left groin hematoma.  Neuro:  Nonfocal  Psych: Normal affect   Labs    Chemistry Recent Labs  Lab 12/27/17 0159 12/28/17 0334 12/29/17 0251  NA 136 133* 138  K 4.3 4.1 4.4  CL 102 101 101  CO2 25 23 27   GLUCOSE 268* 154* 178*  BUN 26* 32* 37*  CREATININE 1.86* 1.76* 1.94*  CALCIUM 9.1 8.7* 8.9  GFRNONAA 23* 25* 22*  GFRAA 27* 29* 26*  ANIONGAP 9 9 10      Hematology Recent Labs  Lab 12/26/17 0845 12/28/17 0334 12/29/17 0251  WBC 10.0 14.5* 12.4*  RBC 3.93 3.72* 3.57*  HGB 12.6 11.9* 11.3*  HCT 39.8 37.2 35.6*  MCV 101.3* 100.0 99.7  MCH 32.1 32.0 31.7  MCHC 31.7 32.0 31.7  RDW 12.6 12.5 12.3  PLT 225 220 223    Cardiac Enzymes Recent Labs  Lab 12/26/17 0845 12/26/17 1115  TROPONINI 1.29* 0.86*   No results for input(s): TROPIPOC in the last 168 hours.   BNP Recent Labs  Lab 12/26/17 0845  BNP 1,003.0*     DDimer No results for input(s): DDIMER in the last 168 hours.   Radiology  No results found.  Cardiac Studies   Echo:    Study Conclusions  - Left ventricle: The cavity size was normal. Wall thickness was   normal. Systolic function was moderately reduced. The estimated   ejection fraction was in the range of 35% to 40%. Akinesis of the   basal-midanteroseptal myocardium. Features are consistent with a   pseudonormal left ventricular filling pattern, with concomitant   abnormal relaxation and increased filling pressure (grade 2   diastolic dysfunction). - Aortic valve: Valve area (VTI): 2.3 cm^2. Valve area (Vmax): 2.25   cm^2. Valve area (Vmean): 1.91 cm^2. - Mitral valve: Valve area by continuity equation (using LVOT   flow): 1.38 cm^2. - Left atrium: The atrium was mildly dilated. - Right ventricle: Systolic function was mildly reduced.  Cardiac cath: Procedures   LEFT HEART CATH AND CORS/GRAFTS ANGIOGRAPHY  Conclusion     Ost LAD to Prox LAD lesion is 100% stenosed.  Ost 1st  Mrg to 1st Mrg lesion is 40% stenosed.  Ost 2nd Mrg lesion is 30% stenosed.  Ost 3rd Mrg lesion is 90% stenosed.  Ost RCA to Dist RCA lesion is 100% stenosed.  LIMA graft was visualized by angiography and is normal in caliber.  The graft exhibits no disease.  SVG graft was visualized by angiography.  Origin to Prox Graft lesion is 100% stenosed.  SVG graft was not visualized due to known occlusion.  Origin to Prox Graft lesion is 100% stenosed.  LV end diastolic pressure is moderately elevated.   1. 3 vessel occlusive CAD    - 100% proximal LAD    - 90% small OM3    - 100% proximal RCA. RCA fills distally by left to right collaterals. 2. Patent LIMA to the LAD 3. Occluded SVG presumably to OM 4. No graft visualized to RCA but vessel fills by collaterals.  5. Moderately elevated LVEDP.  Plan: medical management. Will optimize CHF therapy. Anticipate starting Eliquis in am if no bleeding problems.      Patient Profile     84 y.o. female with history of remote CABG in 1997 in [place] presented to [facility] with acute SOB. When receiving inhaler therapy she developed SVT and then Afib. She was placed on IV amiodarone. Troponin positive up to 1.29. CT chest negative for PE. Transferred for cardiac cath. Limited Echo suggested EF 40% but done during Afib with RVR.   Assessment & Plan    1. Acute respiratory failure. Secondary to acute CHF. EF 35-40% by Echo.  2. NSTEMI. Elevated troponin and lateral ST depression noted on Ecg. 22 years post CABG. On Toprol XL. No angina. Cardiac cath shows patent LIMA to the LAD. SVGs presumably to OM and RCA are occluded. RCA fills by collaterals. Native LCx is patent except for distal branch. Plan medical therapy. 3. Acute on chronic systolic CHF.  May not be a good candidate for ARB/ACEi due to CKD. On toprol XL.  In no respiratory distress today. Will continue po lasix. Monitor renal function closely.  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  Baseline creatinine 1.7-1.8 but has been as high as 2.14. 1.97 yesterday post diuresis and cardiac cath. Will check BMET today 5. Afib with RVR. Now in NSR on oral amiodarone. Was reloaded with IV amiodarone with return to NSR. Will continue 400 mg bid for one week then daily.  6. Thyroid disease. Patient is s/p thyroidectomy and I confirmed with daughter that she has been on long term synthroid 125 micrograms  daily. TSH low on admission indicating too high a replacement dose. Will reduce to lower dose on discharge.  7. 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 A1c 5.9%.  8. HLD. LDL 38. On high dose statin. 9. PAD s/p bilateral iliac stents. Unable to pass catheter through right iliac stent. Will need LE arterial dopplers as outpatient.  10. Deconditioning. Ambulated with PT but wobbly. Will continue PT today. Plan DC tomorrow if stable. Plans to stay with daughter initially but then return to [facility] when able. Will need home health PT.   For questions or updates, please contact [place] Please consult [email or web address] for contact info under Cardiology/STEMI.      Signed, Shevon Sian [place], MD  12/30/2017, 8:57 AM

## 2017-12-30 NOTE — Care Management Note (Addendum)
Case Management Note  Patient Details  Name: Debra [name] MRN: [id number] Date of Birth: 1929-01-26  Subjective/Objective:  Pt presented for Acute Respiratory Failure and Nstemi- Plan for home on Eliquis. CM did Benefits Check and pt is aware of cost. 30 day free card for Eliquis provided to patient. [facility] in [place] has medication available. PT recommendations for [facility] PT Services. Pt is visiting from [place]- staying with daughter Debra [name] in [place] ([address] [place] [address]). CM did speak with patient in regards to agency choice. Patient chose [facility] for services and daughter is agreeable to agency choice.                Action/Plan: CM did make referral with [place] Liaison for [facility]. SOC to begin within 24-48 hours post transition home. CM did discuss with staff RN that pt needs to be weaned from 02. Pt does not use 02 at home. Staff RN stated that she sat's well without oxygen- staff RN to take 02 off and continue to monitor. No further needs from CM at this time.   Expected Discharge Date:                  Expected Discharge Plan:  [place]  In-House Referral:  NA  Discharge planning Services  CM Consult, Medication Assistance  Post Acute Care Choice:  Home Health Choice offered to:  Patient  DME Arranged:  N/A DME Agency:  NA  HH Arranged:  PT HH Agency:  [place]  Status of Service:  Completed, signed off  If discussed at [place] of Stay Meetings, dates discussed:    Additional Comments: Eliquis: Covered/ $47.00 for 30 day retail/ No auth required Bethena Roys, RN 12/30/2017, 2:35 PM

## 2017-12-30 NOTE — Care Management Important Message (Signed)
Important Message  Patient Details  Name: Debra [name] MRN: [id number] Date of Birth: 01/03/1929   Medicare Important Message Given:  Yes    Ziomara Birenbaum P [place] 12/30/2017, 3:28 PM

## 2017-12-30 NOTE — Progress Notes (Signed)
CARDIAC REHAB PHASE I   PRE:  Rate/Rhythm: 51 SB  BP:  Supine:   Sitting: 104/47  Standing:    SaO2: 100%RA  MODE:  Ambulation: 140 ft   POST:  Rate/Rhythm: 69 SR PACs  BP:  Supine:   Sitting: 112/56  Standing:    SaO2: 97%RA 0936-1000 Pt walked 140 ft on RA with gait belt use, rolling walker and minimal asst. Stayed close to walker and tolerated well. Pt stated she has walker at home. Encouraged her to take little walks at home with her family and to use walker. To recliner with call bell. Pt still with productive cough.   Graylon Good, RN BSN  12/30/2017 9:57 AM

## 2017-12-30 NOTE — Discharge Instructions (Signed)

## 2017-12-31 ENCOUNTER — Encounter (HOSPITAL_COMMUNITY): Payer: Self-pay | Admitting: Student

## 2017-12-31 LAB — BASIC METABOLIC PANEL
ANION GAP: 8 (ref 5–15)
BUN: 44 mg/dL — AB (ref 8–23)
CALCIUM: 8.5 mg/dL — AB (ref 8.9–10.3)
CO2: 26 mmol/L (ref 22–32)
Chloride: 104 mmol/L (ref 98–111)
Creatinine, Ser: 2.19 mg/dL — ABNORMAL HIGH (ref 0.44–1.00)
GFR calc Af Amer: 22 mL/min — ABNORMAL LOW (ref >60)
GFR, EST NON AFRICAN AMERICAN: 19 mL/min — AB (ref >60)
GLUCOSE: 60 mg/dL — AB (ref 70–99)
Potassium: 3.2 mmol/L — ABNORMAL LOW (ref 3.5–5.1)
SODIUM: 138 mmol/L (ref 135–145)

## 2017-12-31 LAB — GLUCOSE, CAPILLARY
GLUCOSE-CAPILLARY: 53 mg/dL — AB (ref 70–99)
GLUCOSE-CAPILLARY: 125 mg/dL — AB (ref 70–99)

## 2017-12-31 MED ORDER — AMIODARONE HCL 200 MG PO TABS: 200.0000 mg | ORAL_TABLET | Freq: Two times a day (BID) | ORAL | 6 refills | Status: DC

## 2017-12-31 MED ORDER — ATORVASTATIN CALCIUM 80 MG PO TABS: 80.0000 mg | ORAL_TABLET | Freq: Every day | ORAL | 6 refills | Status: DC

## 2017-12-31 MED ORDER — APIXABAN 2.5 MG PO TABS: 2.5000 mg | ORAL_TABLET | Freq: Two times a day (BID) | ORAL | 6 refills | Status: DC

## 2017-12-31 MED ORDER — POTASSIUM CHLORIDE CRYS ER 20 MEQ PO TBCR
40.0000 meq | EXTENDED_RELEASE_TABLET | Freq: Once | ORAL | Status: AC
  Administered 2017-12-31: 40 meq via ORAL
  Filled 2017-12-31: qty 2

## 2017-12-31 MED ORDER — METOPROLOL SUCCINATE ER 25 MG PO TB24: 37.5000 mg | ORAL_TABLET | Freq: Every day | ORAL | 6 refills | Status: DC

## 2017-12-31 MED ORDER — APIXABAN 2.5 MG PO TABS: 2.5000 mg | ORAL_TABLET | Freq: Two times a day (BID) | ORAL | 0 refills | Status: DC

## 2017-12-31 MED ORDER — AMIODARONE HCL 200 MG PO TABS: 200.0000 mg | ORAL_TABLET | Freq: Two times a day (BID) | ORAL | Status: DC

## 2017-12-31 MED ORDER — FUROSEMIDE 20 MG PO TABS: 20.0000 mg | ORAL_TABLET | Freq: Every day | ORAL | 6 refills | Status: DC

## 2017-12-31 MED ORDER — FUROSEMIDE 20 MG PO TABS: 20.0000 mg | ORAL_TABLET | Freq: Every day | ORAL | Status: DC

## 2017-12-31 NOTE — Progress Notes (Signed)
Progress Note  Patient Name: Debra [name] Date of Encounter: 12/31/2017  Primary Cardiologist: Carlyle Dolly, MD   Subjective   No complaints  Inpatient Medications    Scheduled Meds: . amiodarone  400 mg Oral BID  . apixaban  2.5 mg Oral BID  . atorvastatin  80 mg Oral q1800  . furosemide  40 mg Oral Daily  . gabapentin  300 mg Oral TID  . hydroxychloroquine  400 mg Oral Daily  . insulin aspart  0-9 Units Subcutaneous TID WC  . insulin glargine  20 Units Subcutaneous QHS  . metoprolol succinate  50 mg Oral Daily  . sodium chloride flush  3 mL Intravenous Q12H   Continuous Infusions: . sodium chloride 10 mL/hr at 12/28/17 2000   PRN Meds: sodium chloride, acetaminophen, guaiFENesin-dextromethorphan, HYDROcodone-acetaminophen, nitroGLYCERIN, ondansetron (ZOFRAN) IV, sodium chloride flush   Vital Signs    Vitals:   12/31/17 0009 12/31/17 0400 12/31/17 0401 12/31/17 0745  BP: (!) 104/44  (!) 95/55 124/83  Pulse: (!) 48  (!) 47 (!) 49  Resp: 18  19   Temp: 97.9 F (36.6 C)  98.3 F (36.8 C) 98 F (36.7 C)  TempSrc: Oral  Oral Oral  SpO2: 99%  98% 99%  Weight:  163 lb 11.2 oz (74.3 kg)    Height:        Intake/Output Summary (Last 24 hours) at 12/31/2017 0953 Last data filed at 12/31/2017 0730 Gross per 24 hour  Intake 600 ml  Output -  Net 600 ml   Filed Weights   12/29/17 0500 12/30/17 0434 12/31/17 0400  Weight: 165 lb 9.1 oz (75.1 kg) 162 lb 14.4 oz (73.9 kg) 163 lb 11.2 oz (74.3 kg)    Telemetry    Sinus brady - Personally Reviewed  ECG    na  Physical Exam   GEN: No acute distress.   Neck: No JVD Cardiac: brady, regular, no murmurs, rubs, or gallops.  Respiratory: Clear to auscultation bilaterally. GI: Soft, nontender, non-distended  MS: No edema; No deformity. Neuro:  Nonfocal  Psych: Normal affect   Labs    Chemistry Recent Labs  Lab 12/29/17 0251 12/30/17 0748 12/31/17 0357  NA 138 140 138  K 4.4 3.3* 3.2*  CL 101 105  104  CO2 27 27 26   GLUCOSE 178* 77 60*  BUN 37* 44* 44*  CREATININE 1.94* 2.07* 2.19*  CALCIUM 8.9 8.9 8.5*  GFRNONAA 22* 21* 19*  GFRAA 26* 24* 22*  ANIONGAP 10 8 8      Hematology Recent Labs  Lab 12/26/17 0845 12/28/17 0334 12/29/17 0251  WBC 10.0 14.5* 12.4*  RBC 3.93 3.72* 3.57*  HGB 12.6 11.9* 11.3*  HCT 39.8 37.2 35.6*  MCV 101.3* 100.0 99.7  MCH 32.1 32.0 31.7  MCHC 31.7 32.0 31.7  RDW 12.6 12.5 12.3  PLT 225 220 223    Cardiac Enzymes Recent Labs  Lab 12/26/17 0845 12/26/17 1115  TROPONINI 1.29* 0.86*   No results for input(s): TROPIPOC in the last 168 hours.   BNP Recent Labs  Lab 12/26/17 0845  BNP 1,003.0*     DDimer No results for input(s): DDIMER in the last 168 hours.   Radiology    No results found.  Cardiac Studies   12/2017 cath  Ost LAD to Prox LAD lesion is 100% stenosed.  Ost 1st Mrg to 1st Mrg lesion is 40% stenosed.  Ost 2nd Mrg lesion is 30% stenosed.  Ost 3rd Mrg lesion is 90%  stenosed.  Ost RCA to Dist RCA lesion is 100% stenosed.  LIMA graft was visualized by angiography and is normal in caliber.  The graft exhibits no disease.  SVG graft was visualized by angiography.  Origin to Prox Graft lesion is 100% stenosed.  SVG graft was not visualized due to known occlusion.  Origin to Prox Graft lesion is 100% stenosed.  LV end diastolic pressure is moderately elevated.   1. 3 vessel occlusive CAD    - 100% proximal LAD    - 90% small OM3    - 100% proximal RCA. RCA fills distally by left to right collaterals. 2. Patent LIMA to the LAD 3. Occluded SVG presumably to OM 4. No graft visualized to RCA but vessel fills by collaterals.  5. Moderately elevated LVEDP.  Patient Profile     84 y.o. female with history of remote CABG in 1997 in [place] presented to [facility] with acute SOB. When receiving inhaler therapy she developed SVT and then Afib. She was placed on IV amiodarone. Troponin positive up  to 1.29. CT chest negative for PE. Transferred for cardiac cath. Limited Echo suggested EF 40% but done during Afib with RVR.     Assessment & Plan    1. NSTEMI - prior CABG over 20 years ago - cath as reported above, recs for medical therapy. Moderately elevated LVEDP at 29 - medical therapy with atorva 80, toprol 50. No ACE/ARB/ARNI/aldactone due to poor renal function  2. Acute on chronic systolic HF - echo LVEF 66-06%, grade II diastolic dysfunction. Mild RV dysfunction. LVEDP 29 during cath - on oral diuretic lasix 40mg  daily.  No ACE/ARB/ARNI/aldactone due to poor renal function - uptrend in Cr, lower lasix to 20mg  daily.   3. Afib with RVR - on amio, now oral 400mg  bid. Sinus brady, we will lower amio to 200mg  bid.  - continue eliquis   4. Hypothyroidism - low TSH this admit, synthroid decreased   5. PAD s/p bilateral iliac stents. Unable to pass catheter through right iliac stent. Will need LE arterial dopplers as outpatient.   6. Hypokalemia - replace orally today, nbeeds her oral dose prior to discharge  7. Deconditioning - per care management:  PT recommendations for [facility]. Pt is visiting from [place]- staying with daughter Debra [name] in [place] ([address] [place] [address]). CM did speak with patient in regards to agency choice. Patient chose [facility] for services and daughter is agreeable to agency choice. CM did make referral with [facility] for [facility]. SOC to begin within 24-48 hours post transition home   D/c today. Needs f/u [redacted] weeks along with BMET.    For questions or updates, please contact [place] Please consult [email or web address] for contact info under Cardiology/STEMI.      Merrily Pew, MD  12/31/2017, 9:53 AM

## 2017-12-31 NOTE — Progress Notes (Signed)
Discharge order obtained.  IV removed intact, telemetry monitor removed.  Reviewed AVS with patient/family, including medications, activity/restrictions, follow-up appointments.  Patient and family verbalized understanding.  Questions asked and answered.  Belongings given to family/patient. Patient was given an Eliquis card to take with her to the Pharmacy.  Copy of AVS signature form placed in chart.

## 2017-12-31 NOTE — Discharge Summary (Addendum)
Discharge Summary    Patient ID: Debra [name],  MRN: [id number], DOB/AGE: Feb 27, 1929 84 y.o.  Admit date: 12/26/2017 Discharge date: 12/31/2017  Primary Care Provider: System, Provider Not In Primary Cardiologist: Carlyle Dolly, MD   Discharge Diagnoses    Principal Problem:   Acute on chronic systolic CHF (congestive heart failure) ([place]) Active Problems:   Chronic renal insufficiency, stage 3 (moderate) (HCC)   Rheumatoid arthritis (HCC)   NSTEMI (non-ST elevated myocardial infarction) ([place])   Atrial fibrillation with RVR ([place])   History of Present Illness     Debra [name] is a 84 y.o. female with past medical history of CAD (s/p CABG in 1997), HTN, HLD, Type 2 DM, and GERD who presented to [facility] ED on 12/26/2017 for evaluation of progressive dyspnea on exertion over the past 3 days.   While in the ED, her initial troponin was elevated to 1.29 and BNP was at 1003.  CXR showed postsurgical changes with scarring but no infiltrate noted.  CTA was negative for PE and she was noted to have scattered pulmonary nodules throughout. Was noted to have gone into atrial fibrillation with RVR while in the ED, therefore she was started on IV Amiodarone given her soft BP. Heparin was initiated and she was transferred to [facility] for consideration of a cardiac catheterization once respiratory status improved.   Hospital Course     Consultants: None  The following morning, she denied any recurrent chest pain or dyspnea on exertion. Troponin values had started to trend down but her EKG showed lateral ST depression. Echocardiogram also showed a reduced EF of 35-40% with WMA. Creatinine had increased from 1.25 to 1.86 following her recent CTA, therefore cardiac catheterization was postponed to 12/28/2017.  Her catheterization was performed on 12/28/2017 by Dr. Martinique and showed three-vessel CAD with patent LIMA-LAD and occluded SVG-OM.  No graft was visualized to the RCA but the vessel  filled by collaterals and the native LCx was patent. Medical management of her CHF was recommended.   She was started on Toprol-XL but ACE-I/ARB/ARNI were not added given her CKD. On 12/31/2017, she denied any chest pain or dyspnea. Was overall progressing well and at the time of examination by Dr. Harl Bowie, was deemed stable for discharge. She will be on ASA, Atorvastatin 80mg  daily, Toprol-XL 37.5mg  daily (notes mentioned 50mg  daily but HR was in the high-40's to 50's at discharge, therefore was reduced to 37.5mg  daily), and Lasix 20mg  daily in the setting of her CHF. Eliquis had been initiated for atrial fibrillation and Amiodarone was reduced from 400mg  BID to 200mg  BID (was in NSR at the time of discharge). PT did evaluate the patient and recommended Home Health PT, therefore this was arranged by Case Management. A 2-week hospital follow-up visit has been arranged at our [place] office and she will need a repeat BMET at that time (creatinine 2.19 at discharge with weight at 163 lbs). Was discharged home in stable condition.   _____________  Discharge Vitals Blood pressure (!) 110/54, pulse (!) 49, temperature 97.9 F (36.6 C), temperature source Oral, resp. rate 19, height 5\' 7"  (1.702 m), weight 163 lb 11.2 oz (74.3 kg), SpO2 100 %.  Filed Weights   12/29/17 0500 12/30/17 0434 12/31/17 0400  Weight: 165 lb 9.1 oz (75.1 kg) 162 lb 14.4 oz (73.9 kg) 163 lb 11.2 oz (74.3 kg)    Labs & Radiologic Studies     CBC Recent Labs    12/29/17  0251  WBC 12.4*  HGB 11.3*  HCT 35.6*  MCV 99.7  PLT 643   Basic Metabolic Panel Recent Labs    12/30/17 0748 12/31/17 0357  NA 140 138  K 3.3* 3.2*  CL 105 104  CO2 27 26  GLUCOSE 77 60*  BUN 44* 44*  CREATININE 2.07* 2.19*  CALCIUM 8.9 8.5*   Liver Function Tests No results for input(s): AST, ALT, ALKPHOS, BILITOT, PROT, ALBUMIN in the last 72 hours. No results for input(s): LIPASE, AMYLASE in the last 72 hours. Cardiac Enzymes No  results for input(s): CKTOTAL, CKMB, CKMBINDEX, TROPONINI in the last 72 hours. BNP Invalid input(s): POCBNP D-Dimer No results for input(s): DDIMER in the last 72 hours. Hemoglobin A1C No results for input(s): HGBA1C in the last 72 hours. Fasting Lipid Panel No results for input(s): CHOL, HDL, LDLCALC, TRIG, CHOLHDL, LDLDIRECT in the last 72 hours. Thyroid Function Tests No results for input(s): TSH, T4TOTAL, T3FREE, THYROIDAB in the last 72 hours.  Invalid input(s): FREET3  Dg Chest 2 View  Result Date: 12/26/2017 CLINICAL DATA:  Productive cough.  Shortness of breath. EXAM: CHEST - 2 VIEW COMPARISON:  March 20, 2017 FINDINGS: No pneumothorax. Persistent surgical changes in the right chest with associated scarring. No definitive infiltrate. The cardiomediastinal silhouette is stable. Probable tiny right pleural effusion versus pleural thickening seen on the lateral view. IMPRESSION: 1. Stable postsurgical changes in the right upper chest with scarring. No convincing infiltrate. Pleural thickening versus a tiny right pleural effusion. Electronically Signed   By: Dorise Bullion III M.D   On: 12/26/2017 08:19   Ct Angio Chest Pe W/cm &/or Wo Cm  Result Date: 12/26/2017 CLINICAL DATA:  84 year old female with shortness of breath since 12/23/2017. Subsequent encounter. EXAM: CT ANGIOGRAPHY CHEST WITH CONTRAST TECHNIQUE: Multidetector CT imaging of the chest was performed using the standard protocol during bolus administration of intravenous contrast. Multiplanar CT image reconstructions and MIPs were obtained to evaluate the vascular anatomy. CONTRAST:  53mL ISOVUE-370 IOPAMIDOL (ISOVUE-370) INJECTION 76% COMPARISON:  None. FINDINGS: Cardiovascular:  No pulmonary embolus. Heart slightly enlarged.  Coronary artery calcifications. Calcified plaque throughout the thoracic aorta and great vessels. No aortic dissection. Ascending thoracic aorta measures up to 3.5 cm. Post stenting renal arteries  bilaterally. Mediastinum/Nodes: Small slightly matted partially calcified top-normal size right hilar lymph nodes. Small hiatal hernia. Lungs/Pleura: Postsurgical changes right upper lobe with peribronchial thickening. No comparisons to help establish stability in this region. Left upper lobe 9 x 7 x 4 mm nodule (series 6, image 26 and series 8, image 94). Peripheral left upper lobe 5 x 3 mm nodule (series 6, image 34). Calcified granuloma right upper lobe and right lung base. Small right-sided pleural effusion. Cluster of small nodules superior aspect left lower lobe (series 6 images 49 through 53). Upper Abdomen: Left renal cyst.  Post cholecystectomy. Musculoskeletal: Scoliosis thoracic spine without osseous destructive lesion. Review of the MIP images confirms the above findings. IMPRESSION: No pulmonary embolus detected. Small right-sided pleural effusion of indeterminate etiology. Postsurgical changes right upper lobe with peribronchial thickening. Stability cannot be confirmed as there are no comparison CT exams. Scattered pulmonary nodules largest left upper lobe measuring 9 x 7 x 4 mm (series 6, image 26 and series 8, image 94). Consider one of the following in 3 months for both low-risk and high-risk individuals: (a) repeat chest CT, (b) follow-up PET-CT, or (c) tissue sampling. This recommendation follows the consensus statement: Guidelines for Management of Incidental Pulmonary Nodules Detected  on CT Images: From the Fleischner Society 2017; Radiology 2017; [phone]. Cardiomegaly with coronary artery calcifications. Aortic Atherosclerosis (ICD10-I70.0). Electronically Signed   By: Genia Del M.D.   On: 12/26/2017 12:49     Diagnostic Studies/Procedures    Cardiac Catheterization: 12/28/2017  Ost LAD to Prox LAD lesion is 100% stenosed.  Ost 1st Mrg to 1st Mrg lesion is 40% stenosed.  Ost 2nd Mrg lesion is 30% stenosed.  Ost 3rd Mrg lesion is 90% stenosed.  Ost RCA to Dist RCA lesion  is 100% stenosed.  LIMA graft was visualized by angiography and is normal in caliber.  The graft exhibits no disease.  SVG graft was visualized by angiography.  Origin to Prox Graft lesion is 100% stenosed.  SVG graft was not visualized due to known occlusion.  Origin to Prox Graft lesion is 100% stenosed.  LV end diastolic pressure is moderately elevated.   1. 3 vessel occlusive CAD    - 100% proximal LAD    - 90% small OM3    - 100% proximal RCA. RCA fills distally by left to right collaterals. 2. Patent LIMA to the LAD 3. Occluded SVG presumably to OM 4. No graft visualized to RCA but vessel fills by collaterals.  5. Moderately elevated LVEDP.  Plan: medical management. Will optimize CHF therapy. Anticipate starting Eliquis in am if no bleeding problems.    Echocardiogram: 12/27/2017 Study Conclusions  - Left ventricle: The cavity size was normal. Wall thickness was   normal. Systolic function was moderately reduced. The estimated   ejection fraction was in the range of 35% to 40%. Akinesis of the   basal-midanteroseptal myocardium. Features are consistent with a   pseudonormal left ventricular filling pattern, with concomitant   abnormal relaxation and increased filling pressure (grade 2   diastolic dysfunction). - Aortic valve: Valve area (VTI): 2.3 cm^2. Valve area (Vmax): 2.25   cm^2. Valve area (Vmean): 1.91 cm^2. - Mitral valve: Valve area by continuity equation (using LVOT   flow): 1.38 cm^2. - Left atrium: The atrium was mildly dilated. - Right ventricle: Systolic function was mildly reduced.    Disposition   Pt is being discharged home today in good condition.  Follow-up Plans & Appointments    Follow-up Information    [facility].   Specialty:  Home Health Services Why:  Physical Therapy Contact information: [place] [address] [phone]        Charlie Pitter, PA-C Follow up on  01/19/2018.   Specialties:  Cardiology, Radiology Why:  Cardiology Hospital Follow-up on 01/19/2018 at 1:00 PM with Melina Copa, PA-C (works with Dr. Harl Bowie).  Contact information: [place] [address] [phone]          Discharge Instructions    Amb Referral to Cardiac Rehabilitation   Complete by:  As directed    Will send referral to [facility] in [facility]   Diagnosis:  NSTEMI   Diet - low sodium heart healthy   Complete by:  As directed    Discharge instructions   Complete by:  As directed    [place].  PLEASE ATTEND ALL SCHEDULED FOLLOW-UP APPOINTMENTS.   Activity: Increase activity slowly as tolerated. You may shower, but no soaking baths (or swimming) for 1 week. No driving for 24 hours. No lifting over 5 lbs for 1 week. No sexual activity for 1  week.   You May Return to Work: in 1 week (if applicable)  Wound Care: You may wash cath site gently with soap and water. Keep cath site clean and dry. If you notice pain, swelling, bleeding or pus at your cath site, please call [phone].   Increase activity slowly   Complete by:  As directed       Discharge Medications     Medication List    STOP taking these medications   amLODipine 10 MG tablet Commonly known as:  NORVASC   Vitamin D3 5000 units Caps     TAKE these medications   acetaminophen 325 MG tablet Commonly known as:  TYLENOL Take 650 mg by mouth every 6 (six) hours as needed for mild pain.   amiodarone 200 MG tablet Commonly known as:  PACERONE Take 1 tablet (200 mg total) by mouth 2 (two) times daily. Notes to patient:  Heart rate/rhythm control   apixaban 2.5 MG Tabs tablet Commonly known as:  ELIQUIS Take 1 tablet (2.5 mg total) by mouth 2 (two) times daily.   atorvastatin 80 MG tablet Commonly known as:  LIPITOR Take 1 tablet (80 mg total) by mouth daily at 6 PM.   furosemide 20 MG  tablet Commonly known as:  LASIX Take 1 tablet (20 mg total) by mouth daily. Start taking on:  01/01/2018 Notes to patient:  Diuretic (fluid pill)   gabapentin 300 MG capsule Commonly known as:  NEURONTIN Take 300 mg by mouth 3 (three) times daily.   HYDROcodone-acetaminophen 10-325 MG tablet Commonly known as:  NORCO Take 1 tablet by mouth every 6 (six) hours as needed for moderate pain or severe pain.   hydroxychloroquine 200 MG tablet Commonly known as:  PLAQUENIL Take 400 mg by mouth daily.   insulin glargine 100 unit/mL Sopn Commonly known as:  LANTUS Inject 20 Units into the skin at bedtime.   metoprolol succinate 25 MG 24 hr tablet Commonly known as:  TOPROL-XL Take 1.5 tablets (37.5 mg total) by mouth daily. What changed:  how much to take   mirtazapine 15 MG tablet Commonly known as:  REMERON Take 15 mg by mouth at bedtime.   OVER THE COUNTER MEDICATION Apply 1-2 patches topically daily as needed (hip pain). Lidocaine Icy Hot Patches   ranitidine 150 MG tablet Commonly known as:  ZANTAC Take 150 mg by mouth 2 (two) times daily.   VENTOLIN HFA 108 (90 Base) MCG/ACT inhaler Generic drug:  albuterol Inhale 1 puff into the lungs every 6 (six) hours as needed for shortness of breath or wheezing.         Allergies Allergies  Allergen Reactions  . Ciprofloxacin Rash and Itching  . Iodinated Diagnostic Agents Rash     Acute coronary syndrome (MI, NSTEMI, STEMI, etc) this admission?: Yes.     AHA/ACC Clinical Performance & Quality Measures: 1. Aspirin prescribed? - No - On anticoagulation 2. ADP Receptor Inhibitor (Plavix/Clopidogrel, Brilinta/Ticagrelor or Effient/Prasugrel) prescribed (includes medically managed patients)? - No - On anticoagulation; PCI not performed 3. Beta Blocker prescribed? - Yes 4. High Intensity Statin (Lipitor 40-80mg  or Crestor 20-40mg ) prescribed? - Yes 5. EF assessed during THIS hospitalization? - Yes 6. For EF <40%, was  ACEI/ARB prescribed? - No - Reason:  Soft BP and CKD 7. For EF <40%, Aldosterone Antagonist (Spironolactone or Eplerenone) prescribed? - No - Reason:  Soft BP and CKD 8. Cardiac Rehab Phase II ordered (Included Medically managed Patients)? - Yes    Outstanding Labs/Studies  BMET at follow-up  Duration of Discharge Encounter   Greater than 30 minutes including physician time.  Signed, Erma Heritage, PA-C 12/31/2017, 12:25 PM

## 2018-01-02 ENCOUNTER — Telehealth: Payer: Self-pay | Admitting: Cardiology

## 2018-01-02 NOTE — Telephone Encounter (Signed)
New Message   Debra [name] with [facility] is needing a home health physical therapy order faxed to [phone]

## 2018-01-02 NOTE — Telephone Encounter (Signed)
Patient is followed by dr branch in [place], will forward to the [place] office

## 2018-01-02 NOTE — Telephone Encounter (Signed)
Done

## 2018-01-06 ENCOUNTER — Telehealth: Payer: Self-pay | Admitting: Physician Assistant

## 2018-01-06 NOTE — Telephone Encounter (Signed)
Debra [name] w/ [facility] is needing verbal orders for physical therapy, please call [phone]

## 2018-01-09 NOTE — Telephone Encounter (Signed)
Leslie informed 

## 2018-01-09 NOTE — Telephone Encounter (Signed)
Ok to provider verbal order for PT   J Heran Campau MD

## 2018-01-13 ENCOUNTER — Telehealth: Payer: Self-pay | Admitting: Physician Assistant

## 2018-01-13 NOTE — Telephone Encounter (Signed)
Patient gave permission to speak with daughter. Daughter states today and yesterday patients HR has been in the low 40's. Daughter also states patient has been very tired all day. Daughter stated she is concerned with the weekend coming up. Daughter wondering in the increase in metoprolol could be causing this. Will forward.

## 2018-01-13 NOTE — Telephone Encounter (Signed)
Patient's daughter would like to speak to nurse regarding heart rate / tg

## 2018-01-13 NOTE — Telephone Encounter (Signed)
Spoke with daughter, daughter states patient is actually taking Toprol 37.5 twice daily. Daughter states this is what they were told in hospital. Advised daughter discharge summary on epic states once daily. Daughter will hold tonights dose and start tomorrow with once daily. Daughter instructed on what to do if heart rates continue to be low.

## 2018-01-13 NOTE — Telephone Encounter (Signed)
I've not seen this patient before, so not sure why this is routed to me -  Looks like this is also in triage box. I did review chart and it appears bradycardia was an issue in the hospital as well. Debra [name]'s notes indicate the initial plan was for her to go home on 50mg  daily but this was reduced to 37.5mg  daily at dc. She also went home on amiodarone as well, 200mg  bid which may be contributing. Would suggest she lower Toprol to 12.5mg  daily and call if lower rates persist.  Melina Copa PA-C

## 2018-01-19 ENCOUNTER — Ambulatory Visit: Payer: Medicare Other | Admitting: Physician Assistant

## 2018-02-07 DIAGNOSIS — R42 Dizziness and giddiness: Secondary | ICD-10-CM | POA: Insufficient documentation

## 2018-02-07 DIAGNOSIS — R778 Other specified abnormalities of plasma proteins: Secondary | ICD-10-CM | POA: Insufficient documentation

## 2018-02-07 DIAGNOSIS — I10 Essential (primary) hypertension: Secondary | ICD-10-CM | POA: Insufficient documentation

## 2018-06-19 ENCOUNTER — Encounter: Payer: Self-pay | Admitting: Cardiology

## 2018-06-19 DIAGNOSIS — M25552 Pain in left hip: Secondary | ICD-10-CM | POA: Diagnosis not present

## 2018-06-19 DIAGNOSIS — G894 Chronic pain syndrome: Secondary | ICD-10-CM | POA: Diagnosis not present

## 2018-06-19 DIAGNOSIS — Z6824 Body mass index (BMI) 24.0-24.9, adult: Secondary | ICD-10-CM | POA: Diagnosis not present

## 2018-06-19 NOTE — Progress Notes (Signed)
Cardiology Office Note  Date: 06/20/2018   ID: Debra [name], DOB 05/25/1929, MRN [id number]  PCP: Redmond School, MD  Evaluating Cardiologist: Rozann Lesches, MD   Chief Complaint  Patient presents with  . Coronary Artery Disease    History of Present Illness: Debra [name] is an 84 y.o. female that I am seeing for the first time in clinic today.  I reviewed extensive records.  She was evaluated by Dr. Harl Bowie back in June, hospitalized with acute on chronic systolic heart failure, NSTEMI, and rapid atrial fibrillation.  LVEF was 35 to 40% range by echocardiogram.  She underwent cardiac catheterization with Dr. Martinique demonstrating three-vessel CAD with patent LIMA to the LAD, occluded SVG to OM, and no graft visualized to the RCA although there were left-to-right collaterals.  Medical therapy was recommended.  It does not appear that she has had a follow-up office visit since discharge.  She is here today with her daughter.  Patient moved up to this area from [facility].  She states that she has been doing well over the last 6 months.  No active chest pain or palpitations.  No orthopnea or PND, no leg swelling.  Weight has been relatively stable in the last few months.  I reviewed her medications.  She denies any bleeding problems on Eliquis.  She is due for follow-up lab work which will be arranged.  We also discussed obtaining a follow-up echocardiogram to reassess LVEF.  Past Medical History:  Diagnosis Date  . Arthritis   . CHF (congestive heart failure) ([place])   . CKD (chronic kidney disease) stage 4, GFR 15-29 ml/min (HCC)   . Colon cancer ([place])   . COPD (chronic obstructive pulmonary disease) ([place])   . Coronary artery disease    a. s/p CABG in 1997 (no detials provided) b. 12/2017: NSTEMI with cath showing patent LIMA-LAD with occluded SVG-OM and presumed occluded SVG-RCA but unable to be visualized, RCA was filled by collaterals. Medical management  recommended.   . DDD (degenerative disc disease), lumbar   . Diabetic neuropathy ([place])   . GERD (gastroesophageal reflux disease)   . Gout   . Hypertension   . Restless leg syndrome   . Type 2 diabetes mellitus ([place])     Past Surgical History:  Procedure Laterality Date  . ABDOMINAL HYSTERECTOMY    . CHOLECYSTECTOMY    . COLON RESECTION    . CORONARY ARTERY BYPASS GRAFT    . ILIAC ARTERY STENT    . LEFT HEART CATH AND CORS/GRAFTS ANGIOGRAPHY N/A 12/28/2017   Procedure: LEFT HEART CATH AND CORS/GRAFTS ANGIOGRAPHY;  Surgeon: Martinique, Peter M, MD;  Location: [facility];  Service: Cardiovascular;  Laterality: N/A;  . LUNG SURGERY Left 2012   left lung resection  . RENAL ARTERY STENT    . THYROID SURGERY    . ULTRASOUND GUIDANCE FOR VASCULAR ACCESS  12/28/2017   Procedure: Ultrasound Guidance For Vascular Access;  Surgeon: Martinique, Peter M, MD;  Location: [facility];  Service: Cardiovascular;;    Current Outpatient Medications  Medication Sig Dispense Refill  . acetaminophen (TYLENOL) 325 MG tablet Take 650 mg by mouth every 6 (six) hours as needed for mild pain.    Marland Kitchen albuterol (VENTOLIN HFA) 108 (90 Base) MCG/ACT inhaler Inhale 1 puff into the lungs every 6 (six) hours as needed for shortness of breath or wheezing.    Marland Kitchen apixaban (ELIQUIS) 2.5 MG TABS tablet Take 1 tablet (2.5  mg total) by mouth 2 (two) times daily. 60 tablet 6  . atorvastatin (LIPITOR) 80 MG tablet Take 1 tablet (80 mg total) by mouth daily at 6 PM. 30 tablet 6  . carvedilol (COREG) 3.125 MG tablet Take 3.125 mg by mouth 2 (two) times daily with a meal.    . gabapentin (NEURONTIN) 100 MG capsule Take 300 mg by mouth 3 (three) times daily.     Marland Kitchen gabapentin (NEURONTIN) 300 MG capsule Take 300 mg by mouth 3 (three) times daily.    Marland Kitchen HYDROcodone-acetaminophen (NORCO) 10-325 MG tablet Take 1 tablet by mouth every 6 (six) hours as needed for moderate pain or severe pain.    Marland Kitchen insulin glargine (LANTUS) 100  unit/mL SOPN Inject 20 Units into the skin at bedtime.     Marland Kitchen levothyroxine (UNITHROID) 137 MCG tablet Take by mouth daily before breakfast.     . mirtazapine (REMERON) 15 MG tablet Take 15 mg by mouth at bedtime.    Marland Kitchen OVER THE COUNTER MEDICATION Apply 1-2 patches topically daily as needed (hip pain). Lidocaine Icy Hot Patches    . ranitidine (ZANTAC) 150 MG tablet Take 150 mg by mouth 2 (two) times daily.    . sacubitril-valsartan (ENTRESTO) 24-26 MG Take by mouth.     No current facility-administered medications for this visit.    Allergies:  Ciprofloxacin and Iodinated diagnostic agents   Social History: The patient  reports that she quit smoking about 22 years ago. She has never used smokeless tobacco. She reports that she does not drink alcohol or use drugs.   Family History: The patient's family history includes Breast cancer in her daughter; Colon cancer in her mother; Heart attack in her sister; Heart disease in her father.   ROS:  Please see the history of present illness. Otherwise, complete review of systems is positive for none.  All other systems are reviewed and negative.   Physical Exam: VS:  BP (!) 142/76 (BP Location: Right Arm)   Pulse 77   Ht 5\' 7"  (1.702 m)   Wt 161 lb (73 kg)   SpO2 95%   BMI 25.22 kg/m , BMI Body mass index is 25.22 kg/m.  Wt Readings from Last 3 Encounters:  06/20/18 161 lb (73 kg)  12/31/17 163 lb 11.2 oz (74.3 kg)  03/20/17 189 lb 2.5 oz (85.8 kg)    General: Elderly woman, appears comfortable at rest. HEENT: Conjunctiva and lids normal, oropharynx clear. Neck: Supple, no elevated JVP or carotid bruits, no thyromegaly. Lungs: Clear to auscultation, nonlabored breathing at rest. Cardiac: Regular rate and rhythm, no S3, 2/6 systolic murmur. Abdomen: Soft, nontender, bowel sounds present. Extremities: No pitting edema, distal pulses 2+. Skin: Warm and dry. Musculoskeletal: No kyphosis. Neuropsychiatric: Alert and oriented x3, affect  grossly appropriate.  ECG: I personally reviewed the tracing from 12/30/2017 which showed sinus bradycardia with anterolateral T wave abnormalities.  Recent Labwork: 12/26/2017: B Natriuretic Peptide 1,003.0; Magnesium 1.8; TSH 0.041 12/29/2017: Hemoglobin 11.3; Platelets 223 12/31/2017: BUN 44; Creatinine, Ser 2.19; Potassium 3.2; Sodium 138     Component Value Date/Time   CHOL 89 12/28/2017 0334   TRIG 73 12/28/2017 0334   HDL 36 (L) 12/28/2017 0334   CHOLHDL 2.5 12/28/2017 0334   VLDL 15 12/28/2017 0334   LDLCALC 38 12/28/2017 0334    Other Studies Reviewed Today:  Cardiac catheterization 12/28/2017:  Colon Flattery LAD to Prox LAD lesion is 100% stenosed.  Ost 1st Mrg to 1st Mrg lesion is 40% stenosed.  Ost 2nd Mrg lesion is 30% stenosed.  Ost 3rd Mrg lesion is 90% stenosed.  Ost RCA to Dist RCA lesion is 100% stenosed.  LIMA graft was visualized by angiography and is normal in caliber.  The graft exhibits no disease.  SVG graft was visualized by angiography.  Origin to Prox Graft lesion is 100% stenosed.  SVG graft was not visualized due to known occlusion.  Origin to Prox Graft lesion is 100% stenosed.  LV end diastolic pressure is moderately elevated.   1. 3 vessel occlusive CAD    - 100% proximal LAD    - 90% small OM3    - 100% proximal RCA. RCA fills distally by left to right collaterals. 2. Patent LIMA to the LAD 3. Occluded SVG presumably to OM 4. No graft visualized to RCA but vessel fills by collaterals.  5. Moderately elevated LVEDP.  Plan: medical management. Will optimize CHF therapy. Anticipate starting Eliquis in am if no bleeding problems.   Echocardiogram 12/27/2017: Study Conclusions  - Left ventricle: The cavity size was normal. Wall thickness was   normal. Systolic function was moderately reduced. The estimated   ejection fraction was in the range of 35% to 40%. Akinesis of the   basal-midanteroseptal myocardium. Features are consistent with  a   pseudonormal left ventricular filling pattern, with concomitant   abnormal relaxation and increased filling pressure (grade 2   diastolic dysfunction). - Aortic valve: Valve area (VTI): 2.3 cm^2. Valve area (Vmax): 2.25   cm^2. Valve area (Vmean): 1.91 cm^2. - Mitral valve: Valve area by continuity equation (using LVOT   flow): 1.38 cm^2. - Left atrium: The atrium was mildly dilated. - Right ventricle: Systolic function was mildly reduced.  Assessment and Plan:  1.  Multivessel CAD status post CABG with documented graft disease by cardiac catheterization in June.  She is being managed medically and has had no significant angina symptoms.  Currently not on aspirin due to concurrent use of Eliquis.  She is on statin therapy.  2.  Ischemic cardiomyopathy, LVEF 35 to 40% by echocardiogram in June.  She is clinically stable.  Medications have been modified since that time.  We will obtain a follow-up echocardiogram.  I do not anticipate ICD however at age 83.  3.  Paroxysmal atrial fibrillation, no obvious recurrences.  She is tolerating Eliquis at renal dose.  Follow-up CBC and BMET.  4.  Essential hypertension, no change to current regimen.  Follow-up with PCP.  Current medicines were reviewed with the patient today.   Orders Placed This Encounter  Procedures  . CBC  . Basic Metabolic Panel (BMET)  . ECHOCARDIOGRAM COMPLETE    Disposition: Follow-up in 6 months.  Signed, [name], MD, [facility] 06/20/2018 11:04 AM    [facility] [place]. [address] [place], [place] [address] Phone: [phone]; Fax: [phone]

## 2018-06-20 ENCOUNTER — Ambulatory Visit (INDEPENDENT_AMBULATORY_CARE_PROVIDER_SITE_OTHER): Payer: Medicare HMO | Admitting: Cardiology

## 2018-06-20 ENCOUNTER — Other Ambulatory Visit (HOSPITAL_COMMUNITY)
Admission: RE | Admit: 2018-06-20 | Discharge: 2018-06-20 | Disposition: A | Discharge status: Home / Self Care | Payer: Medicare HMO | Source: Ambulatory Visit | Attending: Cardiology | Admitting: Cardiology

## 2018-06-20 ENCOUNTER — Encounter (INDEPENDENT_AMBULATORY_CARE_PROVIDER_SITE_OTHER): Payer: Self-pay

## 2018-06-20 ENCOUNTER — Encounter: Payer: Self-pay | Admitting: Cardiology

## 2018-06-20 VITALS — BP 142/76 | HR 77 | Ht 67.0 in | Wt 161.0 lb

## 2018-06-20 DIAGNOSIS — I25119 Atherosclerotic heart disease of native coronary artery with unspecified angina pectoris: Secondary | ICD-10-CM | POA: Diagnosis not present

## 2018-06-20 DIAGNOSIS — I10 Essential (primary) hypertension: Secondary | ICD-10-CM | POA: Diagnosis not present

## 2018-06-20 DIAGNOSIS — I480 Paroxysmal atrial fibrillation: Secondary | ICD-10-CM

## 2018-06-20 DIAGNOSIS — I255 Ischemic cardiomyopathy: Secondary | ICD-10-CM | POA: Diagnosis not present

## 2018-06-20 DIAGNOSIS — Z79899 Other long term (current) drug therapy: Secondary | ICD-10-CM | POA: Diagnosis not present

## 2018-06-20 LAB — CBC
HEMATOCRIT: 40 % (ref 36.0–46.0)
HEMOGLOBIN: 12.1 g/dL (ref 12.0–15.0)
MCH: 32.8 pg (ref 26.0–34.0)
MCHC: 30.3 g/dL (ref 30.0–36.0)
MCV: 108.4 fL — AB (ref 80.0–100.0)
NRBC: 0 % (ref 0.0–0.2)
Platelets: 296 10*3/uL (ref 150–400)
RBC: 3.69 MIL/uL — AB (ref 3.87–5.11)
RDW: 13.6 % (ref 11.5–15.5)
WBC: 6.7 10*3/uL (ref 4.0–10.5)

## 2018-06-20 LAB — BASIC METABOLIC PANEL
Anion gap: 4 — ABNORMAL LOW (ref 5–15)
BUN: 28 mg/dL — ABNORMAL HIGH (ref 8–23)
CO2: 26 mmol/L (ref 22–32)
Calcium: 8.7 mg/dL — ABNORMAL LOW (ref 8.9–10.3)
Chloride: 110 mmol/L (ref 98–111)
Creatinine, Ser: 1.71 mg/dL — ABNORMAL HIGH (ref 0.44–1.00)
GFR calc Af Amer: 31 mL/min — ABNORMAL LOW (ref >60)
GFR calc non Af Amer: 26 mL/min — ABNORMAL LOW (ref >60)
GLUCOSE: 184 mg/dL — AB (ref 70–99)
POTASSIUM: 4.7 mmol/L (ref 3.5–5.1)
Sodium: 140 mmol/L (ref 135–145)

## 2018-06-20 NOTE — Patient Instructions (Signed)
Medication Instructions:  Your physician recommends that you continue on your current medications as directed. Please refer to the Current Medication list given to you today.  If you need a refill on your cardiac medications before your next appointment, please call your pharmacy.   Lab work: CBC, BMET today  If you have labs (blood work) drawn today and your tests are completely normal, you will receive your results only by: Marland Kitchen MyChart Message (if you have MyChart) OR . A paper copy in the mail If you have any lab test that is abnormal or we need to change your treatment, we will call you to review the results.  Testing/Procedures: Your physician has requested that you have an echocardiogram in January . Echocardiography is a painless test that uses sound waves to create images of your heart. It provides your doctor with information about the size and shape of your heart and how well your heart's chambers and valves are working. This procedure takes approximately one hour. There are no restrictions for this procedure.    Follow-Up: At [facility], you and your health needs are our priority.  As part of our continuing mission to provide you with exceptional heart care, we have created designated Provider Care Teams.  These Care Teams include your primary Cardiologist (physician) and Advanced Practice Providers (APPs -  Physician Assistants and Nurse Practitioners) who all work together to provide you with the care you need, when you need it. You will need a follow up appointment in 6 months.  Please call our office 2 months in advance to schedule this appointment.  You may see Carlyle Dolly, MD or one of the following Advanced Practice Providers on your designated Care Team:   Bernerd Pho, PA-C [facility]) . Ermalinda Barrios, PA-C ([place])  Any Other Special Instructions Will Be Listed Below (If Applicable). None

## 2018-06-30 ENCOUNTER — Other Ambulatory Visit: Payer: Self-pay

## 2018-06-30 MED ORDER — APIXABAN 2.5 MG PO TABS: 2.5000 mg | ORAL_TABLET | Freq: Two times a day (BID) | ORAL | 3 refills | Status: DC

## 2018-06-30 NOTE — Telephone Encounter (Signed)
E-scribed Eliquis to [employer]

## 2018-07-03 ENCOUNTER — Other Ambulatory Visit: Payer: Self-pay

## 2018-07-03 MED ORDER — APIXABAN 2.5 MG PO TABS: 2.5000 mg | ORAL_TABLET | Freq: Two times a day (BID) | ORAL | 3 refills | Status: DC

## 2018-07-03 NOTE — Telephone Encounter (Signed)
Refilled eliquis per [facility] fax request

## 2018-07-10 ENCOUNTER — Telehealth: Payer: Self-pay | Admitting: Cardiology

## 2018-07-10 MED ORDER — APIXABAN 2.5 MG PO TABS: 2.5000 mg | ORAL_TABLET | Freq: Two times a day (BID) | ORAL | 3 refills | Status: DC

## 2018-07-10 NOTE — Telephone Encounter (Signed)
Will refill 30 day supply vs 90 day , pt has $349 annual deductible she needs to meet with all her drugs   Daughter will pick up 2 boxes samples  Lot [id number], exp 03/2019

## 2018-07-10 NOTE — Telephone Encounter (Signed)
Having problems getting her apixaban (ELIQUIS) 2.5 MG TABS tablet [[id number]] from her mail order pharmacy, they're requesting some samples till that arrives.

## 2018-07-17 ENCOUNTER — Other Ambulatory Visit (HOSPITAL_COMMUNITY): Payer: Medicare HMO

## 2018-07-20 ENCOUNTER — Other Ambulatory Visit: Payer: Self-pay

## 2018-07-20 MED ORDER — APIXABAN 2.5 MG PO TABS: 2.5000 mg | ORAL_TABLET | Freq: Two times a day (BID) | ORAL | 1 refills | Status: DC

## 2018-07-20 NOTE — Telephone Encounter (Signed)
Received faxed mail order rx for Eliquis 2.5mg  BID from [facility]. Pt last saw Dr Domenic Polite 06/20/18, last labs 06/20/18 Creat 1.71, age 85, weight 73kg, based on specified criteria pt is on appropriate dosage of Eliquis 2.5mg  BID.  Will refill rx.

## 2018-07-21 ENCOUNTER — Ambulatory Visit (HOSPITAL_COMMUNITY)
Admission: RE | Admit: 2018-07-21 | Discharge: 2018-07-21 | Disposition: A | Discharge status: Home / Self Care | Payer: Medicare HMO | Source: Ambulatory Visit | Attending: Cardiology | Admitting: Cardiology

## 2018-07-21 DIAGNOSIS — I255 Ischemic cardiomyopathy: Secondary | ICD-10-CM

## 2018-07-21 NOTE — Progress Notes (Signed)
*  PRELIMINARY RESULTS* Echocardiogram 2D Echocardiogram has been performed.  Debra [name] 07/21/2018, 11:04 AM

## 2018-08-02 DIAGNOSIS — I2510 Atherosclerotic heart disease of native coronary artery without angina pectoris: Secondary | ICD-10-CM | POA: Diagnosis not present

## 2018-08-02 DIAGNOSIS — Z794 Long term (current) use of insulin: Secondary | ICD-10-CM | POA: Diagnosis not present

## 2018-08-02 DIAGNOSIS — M1991 Primary osteoarthritis, unspecified site: Secondary | ICD-10-CM | POA: Diagnosis not present

## 2018-08-02 DIAGNOSIS — Z6823 Body mass index (BMI) 23.0-23.9, adult: Secondary | ICD-10-CM | POA: Diagnosis not present

## 2018-08-02 DIAGNOSIS — I10 Essential (primary) hypertension: Secondary | ICD-10-CM | POA: Diagnosis not present

## 2018-08-02 DIAGNOSIS — E7849 Other hyperlipidemia: Secondary | ICD-10-CM | POA: Diagnosis not present

## 2018-08-02 DIAGNOSIS — J069 Acute upper respiratory infection, unspecified: Secondary | ICD-10-CM | POA: Diagnosis not present

## 2018-08-02 DIAGNOSIS — I4891 Unspecified atrial fibrillation: Secondary | ICD-10-CM | POA: Diagnosis not present

## 2018-08-02 DIAGNOSIS — J449 Chronic obstructive pulmonary disease, unspecified: Secondary | ICD-10-CM | POA: Diagnosis not present

## 2018-08-03 ENCOUNTER — Other Ambulatory Visit (HOSPITAL_COMMUNITY): Payer: Self-pay | Admitting: Family Medicine

## 2018-08-03 ENCOUNTER — Ambulatory Visit (HOSPITAL_COMMUNITY)
Admission: RE | Admit: 2018-08-03 | Discharge: 2018-08-03 | Disposition: A | Discharge status: Home / Self Care | Payer: Medicare HMO | Source: Ambulatory Visit | Attending: Family Medicine | Admitting: Family Medicine

## 2018-08-03 DIAGNOSIS — J069 Acute upper respiratory infection, unspecified: Secondary | ICD-10-CM

## 2018-08-03 DIAGNOSIS — J42 Unspecified chronic bronchitis: Secondary | ICD-10-CM | POA: Insufficient documentation

## 2018-08-03 DIAGNOSIS — J9690 Respiratory failure, unspecified, unspecified whether with hypoxia or hypercapnia: Secondary | ICD-10-CM | POA: Diagnosis not present

## 2018-09-06 DIAGNOSIS — E7849 Other hyperlipidemia: Secondary | ICD-10-CM | POA: Diagnosis not present

## 2018-09-06 DIAGNOSIS — Z6823 Body mass index (BMI) 23.0-23.9, adult: Secondary | ICD-10-CM | POA: Diagnosis not present

## 2018-09-06 DIAGNOSIS — M1991 Primary osteoarthritis, unspecified site: Secondary | ICD-10-CM | POA: Diagnosis not present

## 2018-09-06 DIAGNOSIS — I10 Essential (primary) hypertension: Secondary | ICD-10-CM | POA: Diagnosis not present

## 2018-09-06 DIAGNOSIS — G894 Chronic pain syndrome: Secondary | ICD-10-CM | POA: Diagnosis not present

## 2018-09-06 DIAGNOSIS — Z794 Long term (current) use of insulin: Secondary | ICD-10-CM | POA: Diagnosis not present

## 2018-09-06 DIAGNOSIS — I4891 Unspecified atrial fibrillation: Secondary | ICD-10-CM | POA: Diagnosis not present

## 2018-10-06 DIAGNOSIS — G894 Chronic pain syndrome: Secondary | ICD-10-CM | POA: Diagnosis not present

## 2018-10-06 DIAGNOSIS — M1991 Primary osteoarthritis, unspecified site: Secondary | ICD-10-CM | POA: Diagnosis not present

## 2018-10-18 DIAGNOSIS — E1165 Type 2 diabetes mellitus with hyperglycemia: Secondary | ICD-10-CM | POA: Diagnosis not present

## 2018-11-09 DIAGNOSIS — I10 Essential (primary) hypertension: Secondary | ICD-10-CM | POA: Diagnosis not present

## 2018-11-09 DIAGNOSIS — I2510 Atherosclerotic heart disease of native coronary artery without angina pectoris: Secondary | ICD-10-CM | POA: Diagnosis not present

## 2018-11-09 DIAGNOSIS — E7849 Other hyperlipidemia: Secondary | ICD-10-CM | POA: Diagnosis not present

## 2018-11-09 DIAGNOSIS — J449 Chronic obstructive pulmonary disease, unspecified: Secondary | ICD-10-CM | POA: Diagnosis not present

## 2018-11-09 DIAGNOSIS — Z6821 Body mass index (BMI) 21.0-21.9, adult: Secondary | ICD-10-CM | POA: Diagnosis not present

## 2018-11-09 DIAGNOSIS — Z794 Long term (current) use of insulin: Secondary | ICD-10-CM | POA: Diagnosis not present

## 2018-11-09 DIAGNOSIS — G894 Chronic pain syndrome: Secondary | ICD-10-CM | POA: Diagnosis not present

## 2018-11-09 DIAGNOSIS — M1991 Primary osteoarthritis, unspecified site: Secondary | ICD-10-CM | POA: Diagnosis not present

## 2018-12-06 DIAGNOSIS — Z6822 Body mass index (BMI) 22.0-22.9, adult: Secondary | ICD-10-CM | POA: Diagnosis not present

## 2018-12-06 DIAGNOSIS — M2550 Pain in unspecified joint: Secondary | ICD-10-CM | POA: Diagnosis not present

## 2018-12-06 DIAGNOSIS — G894 Chronic pain syndrome: Secondary | ICD-10-CM | POA: Diagnosis not present

## 2018-12-06 DIAGNOSIS — Z1389 Encounter for screening for other disorder: Secondary | ICD-10-CM | POA: Diagnosis not present

## 2018-12-06 DIAGNOSIS — E1165 Type 2 diabetes mellitus with hyperglycemia: Secondary | ICD-10-CM | POA: Diagnosis not present

## 2018-12-06 DIAGNOSIS — Z0001 Encounter for general adult medical examination with abnormal findings: Secondary | ICD-10-CM | POA: Diagnosis not present

## 2018-12-06 DIAGNOSIS — M1991 Primary osteoarthritis, unspecified site: Secondary | ICD-10-CM | POA: Diagnosis not present

## 2018-12-20 ENCOUNTER — Encounter: Payer: Self-pay | Admitting: Internal Medicine

## 2018-12-21 DIAGNOSIS — H2513 Age-related nuclear cataract, bilateral: Secondary | ICD-10-CM | POA: Diagnosis not present

## 2018-12-21 DIAGNOSIS — H40033 Anatomical narrow angle, bilateral: Secondary | ICD-10-CM | POA: Diagnosis not present

## 2018-12-21 DIAGNOSIS — F4323 Adjustment disorder with mixed anxiety and depressed mood: Secondary | ICD-10-CM | POA: Insufficient documentation

## 2018-12-22 ENCOUNTER — Ambulatory Visit (INDEPENDENT_AMBULATORY_CARE_PROVIDER_SITE_OTHER): Payer: Medicare HMO

## 2018-12-22 ENCOUNTER — Other Ambulatory Visit: Payer: Self-pay

## 2018-12-22 ENCOUNTER — Encounter: Payer: Self-pay | Admitting: Podiatry

## 2018-12-22 ENCOUNTER — Ambulatory Visit: Payer: Medicare HMO | Admitting: Podiatry

## 2018-12-22 VITALS — BP 120/85 | HR 73 | Temp 98.0°F | Resp 16

## 2018-12-22 DIAGNOSIS — M2042 Other hammer toe(s) (acquired), left foot: Secondary | ICD-10-CM

## 2018-12-22 DIAGNOSIS — E119 Type 2 diabetes mellitus without complications: Secondary | ICD-10-CM | POA: Diagnosis not present

## 2018-12-22 DIAGNOSIS — M898X7 Other specified disorders of bone, ankle and foot: Secondary | ICD-10-CM

## 2018-12-22 DIAGNOSIS — M2032 Hallux varus (acquired), left foot: Secondary | ICD-10-CM | POA: Diagnosis not present

## 2018-12-22 DIAGNOSIS — B351 Tinea unguium: Secondary | ICD-10-CM | POA: Diagnosis not present

## 2018-12-22 DIAGNOSIS — E1169 Type 2 diabetes mellitus with other specified complication: Secondary | ICD-10-CM

## 2018-12-22 DIAGNOSIS — M79676 Pain in unspecified toe(s): Secondary | ICD-10-CM | POA: Diagnosis not present

## 2018-12-22 DIAGNOSIS — E1151 Type 2 diabetes mellitus with diabetic peripheral angiopathy without gangrene: Secondary | ICD-10-CM

## 2018-12-22 DIAGNOSIS — M2041 Other hammer toe(s) (acquired), right foot: Secondary | ICD-10-CM | POA: Diagnosis not present

## 2018-12-24 NOTE — Progress Notes (Signed)
Subjective:  Patient ID: Debra [name], female    DOB: 05/07/1929,  MRN: [id number]  Chief Complaint  Patient presents with  . Toe Pain    Hallux right - toenail thick and discolored x years, removed before but grew back, tender at times, tried trimming  . Toe Pain    Hallux left - tip of toe red x 2-3 weeks, feels like it rubs shoes but shoes are long enough  . Diabetes    Last a1c was 5.9  . New Patient (Initial Visit)    85 y.o. female presents  for diabetic foot care. Last AMBS was unknown. Denies numbness and tingling in their feet. Denies cramping in legs and thighs.  Review of Systems: Negative except as noted in the HPI. Denies N/V/F/Ch.  Past Medical History:  Diagnosis Date  . Arthritis   . CHF (congestive heart failure) (HCC)   . CKD (chronic kidney disease) stage 4, GFR 15-29 ml/min (HCC)   . Colon cancer (HCC)   . COPD (chronic obstructive pulmonary disease) (HCC)   . Coronary artery disease    a. s/p CABG in 1997 (no detials provided) b. 12/2017: NSTEMI with cath showing patent LIMA-LAD with occluded SVG-OM and presumed occluded SVG-RCA but unable to be visualized, RCA was filled by collaterals. Medical management recommended.   . DDD (degenerative disc disease), lumbar   . Diabetic neuropathy (HCC)   . GERD (gastroesophageal reflux disease)   . Gout   . Hypertension   . Restless leg syndrome   . Type 2 diabetes mellitus (HCC)     Current Outpatient Medications:  .  ACCU-CHEK AVIVA PLUS test strip, , Disp: , Rfl:  .  acetaminophen (TYLENOL) 325 MG tablet, Take 650 mg by mouth every 6 (six) hours as needed for mild pain., Disp: , Rfl:  .  albuterol (VENTOLIN HFA) 108 (90 Base) MCG/ACT inhaler, Inhale 1 puff into the lungs every 6 (six) hours as needed for shortness of breath or wheezing., Disp: , Rfl:  .  apixaban (ELIQUIS) 2.5 MG TABS tablet, Take 1 tablet (2.5 mg total) by mouth 2 (two) times daily., Disp: 180 tablet, Rfl: 1 .  atorvastatin (LIPITOR) 80 MG  tablet, Take 1 tablet (80 mg total) by mouth daily at 6 PM., Disp: 30 tablet, Rfl: 6 .  Blood Glucose Monitoring Suppl (ACCU-CHEK AVIVA PLUS) w/Device KIT, , Disp: , Rfl:  .  carvedilol (COREG) 3.125 MG tablet, Take 3.125 mg by mouth 2 (two) times daily with a meal., Disp: , Rfl:  .  gabapentin (NEURONTIN) 100 MG capsule, Take 300 mg by mouth 3 (three) times daily. , Disp: , Rfl:  .  HYDROcodone-acetaminophen (NORCO) 10-325 MG tablet, Take 1 tablet by mouth every 6 (six) hours as needed for moderate pain or severe pain., Disp: , Rfl:  .  insulin glargine (LANTUS) 100 unit/mL SOPN, Inject 20 Units into the skin at bedtime. , Disp: , Rfl:  .  levothyroxine (UNITHROID) 137 MCG tablet, Take by mouth daily before breakfast. , Disp: , Rfl:  .  mirtazapine (REMERON) 15 MG tablet, Take 15 mg by mouth at bedtime., Disp: , Rfl:  .  pantoprazole (PROTONIX) 40 MG tablet, , Disp: , Rfl:  .  rOPINIRole (REQUIP) 2 MG tablet, , Disp: , Rfl:  .  sacubitril-valsartan (ENTRESTO) 24-26 MG, Take by mouth., Disp: , Rfl:   Social History   Tobacco Use  Smoking Status Former Smoker  . Quit date: 03/21/1996  . Years since quitting: 22.7    Smokeless Tobacco Never Used    Allergies  Allergen Reactions  . Ciprofloxacin Rash and Itching  . Iodine   . Iodinated Diagnostic Agents Rash   Objective:   Vitals:   12/22/18 1104  BP: 120/85  Pulse: 73  Resp: 16  Temp: 98 F (36.7 C)   There is no height or weight on file to calculate BMI. Constitutional Well developed. Well nourished.  Vascular Dorsalis pedis pulses present 1+ left absent right Posterior tibial pulses present 1+ right absent left Pedal hair growth absent. Capillary refill delayed to all digits.  No cyanosis or clubbing noted.  Neurologic Normal speech. Oriented to person, place, and time. Epicritic sensation to light touch grossly present bilaterally. Protective sensation with 5.07 monofilament  present bilaterally. Vibratory sensation  present bilaterally.  Dermatologic Nails elongated, thickened, dystrophic. No open wounds. No skin lesions.  Orthopedic: Normal joint ROM without pain or crepitus bilaterally. Hammertoes bilateral feet No bony tenderness.   Assessment:   1. Exostosis of toe   2. Onychomycosis of multiple toenails with type 2 diabetes mellitus and peripheral angiopathy (HCC)   3. Encounter for diabetic foot exam (HCC)   4. Pain around toenail   5. Hammertoe, bilateral   6. Hallux malleus, left    Plan:  Patient was evaluated and treated and all questions answered.  Diabetes with PAD, Onychomycosis -Discussed the patient that I do not think at this point removing the nail is a good option but I will be happy to perform nail down so that it is thin and does not cause her pain anymore.  Patient verbalized relief post debridement.  Since she has poor pulses she will follow-up in 3 months for routine foot care services. -Educated on diabetic footcare. Diabetic risk level 1 -Nails x10 debrided sharply and manually with large nail nipper and rotary burr.   Procedure: Nail Debridement Rationale: Patient meets criteria for routine foot care due to PAD Type of Debridement: manual, sharp debridement. Instrumentation: Nail nipper, rotary burr. Number of Nails: 9   Hallux malleus left great toe, hammertoes of lesser digits -Due to the pressure she is getting contact with the toe I do think she would benefit from diabetic shoes.  She is at high risk due to the digital deformities plus her poor circulation  No follow-ups on file.  

## 2018-12-29 DIAGNOSIS — M545 Low back pain: Secondary | ICD-10-CM | POA: Diagnosis not present

## 2018-12-29 DIAGNOSIS — M7062 Trochanteric bursitis, left hip: Secondary | ICD-10-CM | POA: Diagnosis not present

## 2019-01-02 ENCOUNTER — Other Ambulatory Visit: Payer: Medicare HMO | Admitting: Orthotics

## 2019-01-04 ENCOUNTER — Telehealth: Payer: Self-pay | Admitting: Cardiology

## 2019-01-04 NOTE — Telephone Encounter (Signed)

## 2019-01-09 ENCOUNTER — Telehealth (INDEPENDENT_AMBULATORY_CARE_PROVIDER_SITE_OTHER): Payer: Medicare HMO | Admitting: Cardiology

## 2019-01-09 ENCOUNTER — Other Ambulatory Visit: Payer: Self-pay

## 2019-01-09 ENCOUNTER — Encounter: Payer: Self-pay | Admitting: Cardiology

## 2019-01-09 DIAGNOSIS — G894 Chronic pain syndrome: Secondary | ICD-10-CM | POA: Diagnosis not present

## 2019-01-09 DIAGNOSIS — I10 Essential (primary) hypertension: Secondary | ICD-10-CM | POA: Diagnosis not present

## 2019-01-09 DIAGNOSIS — Z7189 Other specified counseling: Secondary | ICD-10-CM

## 2019-01-09 DIAGNOSIS — M1991 Primary osteoarthritis, unspecified site: Secondary | ICD-10-CM | POA: Diagnosis not present

## 2019-01-09 DIAGNOSIS — I25119 Atherosclerotic heart disease of native coronary artery with unspecified angina pectoris: Secondary | ICD-10-CM | POA: Diagnosis not present

## 2019-01-09 DIAGNOSIS — I480 Paroxysmal atrial fibrillation: Secondary | ICD-10-CM

## 2019-01-09 DIAGNOSIS — I255 Ischemic cardiomyopathy: Secondary | ICD-10-CM

## 2019-01-09 NOTE — Patient Instructions (Addendum)
Medication Instructions: Your physician recommends that you continue on your current medications as directed. Please refer to the Current Medication list given to you today.   Labwork: I will request lab work from [facility]  Procedures/Testing: none  Follow-Up: 6 months with either Dr.McDowell or [place] PA-C  Any Additional Special Instructions Will Be Listed Below (If Applicable).     If you need a refill on your cardiac medications before your next appointment, please call your pharmacy.     Thank you for choosing [facility] !

## 2019-01-09 NOTE — Progress Notes (Signed)
Virtual Visit via Video Note   This visit type was conducted due to national recommendations for restrictions regarding the COVID-19 Pandemic (e.g. social distancing) in an effort to limit this patient's exposure and mitigate transmission in our community.  Due to her co-morbid illnesses, this patient is at least at moderate risk for complications without adequate follow up.  This format is felt to be most appropriate for this patient at this time.  All issues noted in this document were discussed and addressed.  A limited physical exam was performed with this format.  Please refer to the patient's chart for her consent to telehealth for [facility].   Date:  01/09/2019   ID:  Debra [name], DOB 17-May-1929, MRN [id number]  Patient Location: Home Provider Location: Office  PCP:  Sharilyn Sites, MD  Cardiologist:  Rozann Lesches, MD Electrophysiologist:  None   Evaluation Performed:  Follow-Up Visit  Chief Complaint:   Cardiac follow-up  History of Present Illness:    Debra [name] is an 85 y.o. female that I met in December 2019.  We communicated by video conferencing today.  I also spoke with her daughter who was present.  Overall, no progressive angina symptoms reported.  No worsening shortness of breath.  She does have occasional lightheadedness, but has more good days and bad days according to her daughter.  She has had weight loss, just reports lack of appetite.  Follow-up echocardiogram in January 45% range with wall motion abnormalities consistent with ischemic cardiomyopathy.  This represents an increase in function overall.  Reviewed her medications which are listed below.  She reports no bleeding problems on Eliquis.  Also tolerating Entresto and Coreg.  She is due for follow-up lab work, states that she had some done at [place] about a month ago which we will request.  The patient does not have symptoms concerning for COVID-19 infection (fever, chills, cough, or new  shortness of breath).    Past Medical History:  Diagnosis Date  . Arthritis   . CHF (congestive heart failure) ([place])   . CKD (chronic kidney disease) stage 4, GFR 15-29 ml/min (HCC)   . Colon cancer ([place])   . COPD (chronic obstructive pulmonary disease) ([place])   . Coronary artery disease    a. s/p CABG in 1997 (no detials provided) b. 12/2017: NSTEMI with cath showing patent LIMA-LAD with occluded SVG-OM and presumed occluded SVG-RCA but unable to be visualized, RCA was filled by collaterals. Medical management recommended.   . DDD (degenerative disc disease), lumbar   . Diabetic neuropathy ([place])   . GERD (gastroesophageal reflux disease)   . Gout   . Hypertension   . Restless leg syndrome   . Type 2 diabetes mellitus ([place])    Past Surgical History:  Procedure Laterality Date  . ABDOMINAL HYSTERECTOMY    . CHOLECYSTECTOMY    . COLON RESECTION    . CORONARY ARTERY BYPASS GRAFT    . ILIAC ARTERY STENT    . LEFT HEART CATH AND CORS/GRAFTS ANGIOGRAPHY N/A 12/28/2017   Procedure: LEFT HEART CATH AND CORS/GRAFTS ANGIOGRAPHY;  Surgeon: Martinique, Peter M, MD;  Location: [place] CV LAB;  Service: Cardiovascular;  Laterality: N/A;  . LUNG SURGERY Left 2012   left lung resection  . RENAL ARTERY STENT    . THYROID SURGERY    . ULTRASOUND GUIDANCE FOR VASCULAR ACCESS  12/28/2017   Procedure: Ultrasound Guidance For Vascular Access;  Surgeon: Martinique, Peter M, MD;  Location: [facility] INVASIVE CV  LAB;  Service: Cardiovascular;;     No outpatient medications have been marked as taking for the 01/09/19 encounter (Telemedicine) with [name], MD.     Allergies:   Ciprofloxacin, Iodine, and Iodinated diagnostic agents   Social History   Tobacco Use  . Smoking status: Former Smoker    Quit date: 03/21/1996    Years since quitting: 22.8  . Smokeless tobacco: Never Used  Substance Use Topics  . Alcohol use: No  . Drug use: No     Family Hx: The patient's family history includes Breast  cancer in her daughter; Colon cancer in her mother; Heart attack in her sister; Heart disease in her father.  ROS:   Please see the history of present illness.    Hearing loss. All other systems reviewed and are negative.   Prior CV studies:   The following studies were reviewed today:  Cardiac catheterization 12/28/2017:  Ost LAD to Prox LAD lesion is 100% stenosed.  Ost 1st Mrg to 1st Mrg lesion is 40% stenosed.  Ost 2nd Mrg lesion is 30% stenosed.  Ost 3rd Mrg lesion is 90% stenosed.  Ost RCA to Dist RCA lesion is 100% stenosed.  LIMA graft was visualized by angiography and is normal in caliber.  The graft exhibits no disease.  SVG graft was visualized by angiography.  Origin to Prox Graft lesion is 100% stenosed.  SVG graft was not visualized due to known occlusion.  Origin to Prox Graft lesion is 100% stenosed.  LV end diastolic pressure is moderately elevated.  1. 3 vessel occlusive CAD - 100% proximal LAD - 90% small OM3 - 100% proximal RCA. RCA fills distally by left to right collaterals. 2. Patent LIMA to the LAD 3. Occluded SVG presumably to OM 4. No graft visualized to RCA but vessel fills by collaterals.  5. Moderately elevated LVEDP.  Plan: medical management. Will optimize CHF therapy. Anticipate starting Eliquis in am if no bleeding problems.   Echocardiogram 07/21/2018: Study Conclusions  - Left ventricle: The cavity size was normal. Wall thickness was   increased in a pattern of mild LVH. Systolic function was mildly   to moderately reduced. The estimated ejection fraction was in the   range of 40% to 45%. There is akinesis of the   basal-midanteroseptal myocardium. There is hypokinesis of the   basalinferior myocardium. Doppler parameters are consistent with   abnormal left ventricular relaxation (grade 1 diastolic   dysfunction). - Aortic valve: Mildly to moderately calcified annulus. Trileaflet;   mildly calcified leaflets. -  Mitral valve: Mildly calcified leaflets. There was trivial   regurgitation. - Right atrium: Central venous pressure (est): 3 mm Hg. - Atrial septum: No defect or patent foramen ovale was identified. - Tricuspid valve: There was trivial regurgitation. - Pulmonary arteries: Systolic pressure could not be accurately   estimated. - Pericardium, extracardiac: A prominent pericardial fat pad was   present.  Labs/Other Tests and Data Reviewed:    EKG:  An ECG dated 12/28/2017 was personally reviewed today and demonstrated:  Apparent sinus bradycardia with low voltage, poor quality faxed copy of tracing.  Recent Labs: 06/20/2018: BUN 28; Creatinine, Ser 1.71; Hemoglobin 12.1; Platelets 296; Potassium 4.7; Sodium 140   Recent Lipid Panel Lab Results  Component Value Date/Time   CHOL 89 12/28/2017 03:34 AM   TRIG 73 12/28/2017 03:34 AM   HDL 36 (L) 12/28/2017 03:34 AM   CHOLHDL 2.5 12/28/2017 03:34 AM   LDLCALC 38 12/28/2017 03:34 AM  Wt Readings from Last 3 Encounters:  01/09/19 135 lb (61.2 kg)  06/20/18 161 lb (73 kg)  12/31/17 163 lb 11.2 oz (74.3 kg)     Objective:    Vital Signs:  BP 128/63   Pulse 68   Ht 5' 7"  (1.702 m)   Wt 135 lb (61.2 kg)   BMI 21.14 kg/m    General: Elderly woman, no distress, appears comfortable. HEENT: Conjunctiva and lids normal.  Wearing glasses. Skin: Normal appearance of color and turgor. Lungs: Nonlabored breathing while speaking.  No coughing. Neuropsychiatric: Gaze conjugate.  Speech pattern normal.  ASSESSMENT & PLAN:    1.  Multivessel CAD status post CABG with documented graft disease and is being managed medically based on angiography from June 2019.  She reports no active angina symptoms at this time and we will continue with observation.  2.  Ischemic cardiomyopathy, LVEF up to the range of 40 to 45% by most recent assessment.  She will continue on Coreg and Entresto.  3.  Paroxysmal atrial fibrillation.  Continue Eliquis for  stroke prophylaxis.  Requesting recent lab work from [facility].  4.  Essential hypertension, blood pressure is adequately controlled today.  COVID-19 Education: The signs and symptoms of COVID-19 were discussed with the patient and how to seek care for testing (follow up with PCP or arrange E-visit).  The importance of social distancing was discussed today.  Time:   Today, I have spent 8 minutes with the patient with telehealth technology discussing the above problems.     Medication Adjustments/Labs and Tests Ordered: Current medicines are reviewed at length with the patient today.  Concerns regarding medicines are outlined above.   Tests Ordered: No orders of the defined types were placed in this encounter.   Medication Changes: No orders of the defined types were placed in this encounter.   Follow Up:  In Person 6 months in the [place] office.  Signed, Rozann Lesches, MD  01/09/2019 3:36 PM    [place]

## 2019-01-11 ENCOUNTER — Other Ambulatory Visit: Payer: Medicare HMO | Admitting: Orthotics

## 2019-01-18 ENCOUNTER — Other Ambulatory Visit: Payer: Self-pay

## 2019-01-18 ENCOUNTER — Ambulatory Visit: Payer: Medicare HMO | Admitting: Orthotics

## 2019-01-18 DIAGNOSIS — M2041 Other hammer toe(s) (acquired), right foot: Secondary | ICD-10-CM

## 2019-01-18 DIAGNOSIS — E119 Type 2 diabetes mellitus without complications: Secondary | ICD-10-CM

## 2019-01-18 DIAGNOSIS — M2032 Hallux varus (acquired), left foot: Secondary | ICD-10-CM

## 2019-01-18 DIAGNOSIS — M6788 Other specified disorders of synovium and tendon, other site: Secondary | ICD-10-CM

## 2019-01-18 NOTE — Progress Notes (Signed)

## 2019-02-02 DIAGNOSIS — M5431 Sciatica, right side: Secondary | ICD-10-CM | POA: Diagnosis not present

## 2019-02-19 DIAGNOSIS — G894 Chronic pain syndrome: Secondary | ICD-10-CM | POA: Diagnosis not present

## 2019-02-22 ENCOUNTER — Other Ambulatory Visit: Payer: Self-pay

## 2019-02-22 ENCOUNTER — Observation Stay (HOSPITAL_COMMUNITY)
Admission: EM | Admit: 2019-02-22 | Discharge: 2019-02-24 | Disposition: A | Discharge status: Home / Self Care | Payer: Medicare HMO | Attending: Internal Medicine | Admitting: Internal Medicine

## 2019-02-22 ENCOUNTER — Encounter (HOSPITAL_COMMUNITY): Payer: Self-pay | Admitting: Emergency Medicine

## 2019-02-22 DIAGNOSIS — K219 Gastro-esophageal reflux disease without esophagitis: Secondary | ICD-10-CM | POA: Diagnosis not present

## 2019-02-22 DIAGNOSIS — Z87891 Personal history of nicotine dependence: Secondary | ICD-10-CM | POA: Diagnosis not present

## 2019-02-22 DIAGNOSIS — Z20828 Contact with and (suspected) exposure to other viral communicable diseases: Secondary | ICD-10-CM | POA: Insufficient documentation

## 2019-02-22 DIAGNOSIS — L03114 Cellulitis of left upper limb: Secondary | ICD-10-CM | POA: Insufficient documentation

## 2019-02-22 DIAGNOSIS — I2510 Atherosclerotic heart disease of native coronary artery without angina pectoris: Secondary | ICD-10-CM | POA: Insufficient documentation

## 2019-02-22 DIAGNOSIS — E1140 Type 2 diabetes mellitus with diabetic neuropathy, unspecified: Secondary | ICD-10-CM | POA: Diagnosis not present

## 2019-02-22 DIAGNOSIS — Z951 Presence of aortocoronary bypass graft: Secondary | ICD-10-CM | POA: Diagnosis not present

## 2019-02-22 DIAGNOSIS — E11628 Type 2 diabetes mellitus with other skin complications: Principal | ICD-10-CM | POA: Insufficient documentation

## 2019-02-22 DIAGNOSIS — L03119 Cellulitis of unspecified part of limb: Secondary | ICD-10-CM | POA: Diagnosis not present

## 2019-02-22 DIAGNOSIS — E039 Hypothyroidism, unspecified: Secondary | ICD-10-CM | POA: Insufficient documentation

## 2019-02-22 DIAGNOSIS — I482 Chronic atrial fibrillation, unspecified: Secondary | ICD-10-CM | POA: Insufficient documentation

## 2019-02-22 DIAGNOSIS — L0390 Cellulitis, unspecified: Secondary | ICD-10-CM | POA: Diagnosis present

## 2019-02-22 DIAGNOSIS — I5042 Chronic combined systolic (congestive) and diastolic (congestive) heart failure: Secondary | ICD-10-CM | POA: Diagnosis not present

## 2019-02-22 DIAGNOSIS — J449 Chronic obstructive pulmonary disease, unspecified: Secondary | ICD-10-CM | POA: Insufficient documentation

## 2019-02-22 DIAGNOSIS — I130 Hypertensive heart and chronic kidney disease with heart failure and stage 1 through stage 4 chronic kidney disease, or unspecified chronic kidney disease: Secondary | ICD-10-CM | POA: Diagnosis not present

## 2019-02-22 DIAGNOSIS — N184 Chronic kidney disease, stage 4 (severe): Secondary | ICD-10-CM | POA: Insufficient documentation

## 2019-02-22 DIAGNOSIS — E1122 Type 2 diabetes mellitus with diabetic chronic kidney disease: Secondary | ICD-10-CM | POA: Diagnosis not present

## 2019-02-22 DIAGNOSIS — I252 Old myocardial infarction: Secondary | ICD-10-CM | POA: Insufficient documentation

## 2019-02-22 DIAGNOSIS — Z85038 Personal history of other malignant neoplasm of large intestine: Secondary | ICD-10-CM | POA: Insufficient documentation

## 2019-02-22 DIAGNOSIS — L539 Erythematous condition, unspecified: Secondary | ICD-10-CM | POA: Diagnosis present

## 2019-02-22 LAB — URINALYSIS, ROUTINE W REFLEX MICROSCOPIC
Bilirubin Urine: NEGATIVE
Glucose, UA: NEGATIVE mg/dL
Hgb urine dipstick: NEGATIVE
Ketones, ur: NEGATIVE mg/dL
Nitrite: NEGATIVE
Protein, ur: NEGATIVE mg/dL
Specific Gravity, Urine: 1.011 (ref 1.005–1.030)
pH: 5 (ref 5.0–8.0)

## 2019-02-22 LAB — COMPREHENSIVE METABOLIC PANEL
ALT: 15 U/L (ref 0–44)
AST: 19 U/L (ref 15–41)
Albumin: 3.3 g/dL — ABNORMAL LOW (ref 3.5–5.0)
Alkaline Phosphatase: 85 U/L (ref 38–126)
Anion gap: 9 (ref 5–15)
BUN: 24 mg/dL — ABNORMAL HIGH (ref 8–23)
CO2: 23 mmol/L (ref 22–32)
Calcium: 9 mg/dL (ref 8.9–10.3)
Chloride: 107 mmol/L (ref 98–111)
Creatinine, Ser: 1.22 mg/dL — ABNORMAL HIGH (ref 0.44–1.00)
GFR calc Af Amer: 46 mL/min — ABNORMAL LOW (ref >60)
GFR calc non Af Amer: 40 mL/min — ABNORMAL LOW (ref >60)
Glucose, Bld: 91 mg/dL (ref 70–99)
Potassium: 3.9 mmol/L (ref 3.5–5.1)
Sodium: 139 mmol/L (ref 135–145)
Total Bilirubin: 1.6 mg/dL — ABNORMAL HIGH (ref 0.3–1.2)
Total Protein: 6 g/dL — ABNORMAL LOW (ref 6.5–8.1)

## 2019-02-22 LAB — SARS CORONAVIRUS 2 BY RT PCR (HOSPITAL ORDER, PERFORMED IN ~~LOC~~ HOSPITAL LAB): SARS Coronavirus 2: NEGATIVE

## 2019-02-22 LAB — PROTIME-INR
INR: 1.2 (ref 0.8–1.2)
Prothrombin Time: 15.5 seconds — ABNORMAL HIGH (ref 11.4–15.2)

## 2019-02-22 LAB — CBC WITH DIFFERENTIAL/PLATELET
Abs Immature Granulocytes: 0.03 10*3/uL (ref 0.00–0.07)
Basophils Absolute: 0 10*3/uL (ref 0.0–0.1)
Basophils Relative: 0 %
Eosinophils Absolute: 0.1 10*3/uL (ref 0.0–0.5)
Eosinophils Relative: 1 %
HCT: 38.9 % (ref 36.0–46.0)
Hemoglobin: 12.4 g/dL (ref 12.0–15.0)
Immature Granulocytes: 0 %
Lymphocytes Relative: 15 %
Lymphs Abs: 1.4 10*3/uL (ref 0.7–4.0)
MCH: 31.8 pg (ref 26.0–34.0)
MCHC: 31.9 g/dL (ref 30.0–36.0)
MCV: 99.7 fL (ref 80.0–100.0)
Monocytes Absolute: 0.9 10*3/uL (ref 0.1–1.0)
Monocytes Relative: 9 %
Neutro Abs: 7.2 10*3/uL (ref 1.7–7.7)
Neutrophils Relative %: 75 %
Platelets: 190 10*3/uL (ref 150–400)
RBC: 3.9 MIL/uL (ref 3.87–5.11)
RDW: 15.2 % (ref 11.5–15.5)
WBC: 9.6 10*3/uL (ref 4.0–10.5)
nRBC: 0 % (ref 0.0–0.2)

## 2019-02-22 LAB — CBG MONITORING, ED: Glucose-Capillary: 105 mg/dL — ABNORMAL HIGH (ref 70–99)

## 2019-02-22 LAB — LACTIC ACID, PLASMA: Lactic Acid, Venous: 1 mmol/L (ref 0.5–1.9)

## 2019-02-22 MED ORDER — SACUBITRIL-VALSARTAN 24-26 MG PO TABS
1.0000 | ORAL_TABLET | Freq: Two times a day (BID) | ORAL | Status: DC
  Administered 2019-02-23 – 2019-02-24 (×3): 1 via ORAL
  Filled 2019-02-22 (×12): qty 1

## 2019-02-22 MED ORDER — PANTOPRAZOLE SODIUM 40 MG PO TBEC
40.0000 mg | DELAYED_RELEASE_TABLET | Freq: Every day | ORAL | Status: DC
  Administered 2019-02-23 – 2019-02-24 (×2): 40 mg via ORAL
  Filled 2019-02-22 (×2): qty 1

## 2019-02-22 MED ORDER — HYDROCODONE-ACETAMINOPHEN 5-325 MG PO TABS
2.0000 | ORAL_TABLET | Freq: Once | ORAL | Status: AC
  Administered 2019-02-22: 2 via ORAL
  Filled 2019-02-22: qty 2

## 2019-02-22 MED ORDER — INSULIN ASPART 100 UNIT/ML ~~LOC~~ SOLN
0.0000 [IU] | Freq: Three times a day (TID) | SUBCUTANEOUS | Status: DC
  Administered 2019-02-23 – 2019-02-24 (×2): 2 [IU] via SUBCUTANEOUS

## 2019-02-22 MED ORDER — ATORVASTATIN CALCIUM 40 MG PO TABS
80.0000 mg | ORAL_TABLET | Freq: Every day | ORAL | Status: DC
  Administered 2019-02-23: 80 mg via ORAL
  Filled 2019-02-22: qty 2

## 2019-02-22 MED ORDER — ONDANSETRON HCL 4 MG/2ML IJ SOLN: 4.0000 mg | Freq: Four times a day (QID) | INTRAMUSCULAR | Status: DC | PRN

## 2019-02-22 MED ORDER — HYDROCODONE-ACETAMINOPHEN 10-325 MG PO TABS: 1.0000 | ORAL_TABLET | Freq: Four times a day (QID) | ORAL | Status: DC | PRN

## 2019-02-22 MED ORDER — ALBUTEROL SULFATE HFA 108 (90 BASE) MCG/ACT IN AERS
1.0000 | INHALATION_SPRAY | Freq: Four times a day (QID) | RESPIRATORY_TRACT | Status: DC | PRN
  Filled 2019-02-22: qty 6.7

## 2019-02-22 MED ORDER — VANCOMYCIN HCL IN DEXTROSE 1-5 GM/200ML-% IV SOLN
1000.0000 mg | Freq: Once | INTRAVENOUS | Status: AC
  Administered 2019-02-22: 1000 mg via INTRAVENOUS
  Filled 2019-02-22: qty 200

## 2019-02-22 MED ORDER — APIXABAN 2.5 MG PO TABS
2.5000 mg | ORAL_TABLET | Freq: Two times a day (BID) | ORAL | Status: DC
  Administered 2019-02-23 – 2019-02-24 (×4): 2.5 mg via ORAL
  Filled 2019-02-22 (×12): qty 1

## 2019-02-22 MED ORDER — LEVOTHYROXINE SODIUM 137 MCG PO TABS
137.0000 ug | ORAL_TABLET | Freq: Every day | ORAL | Status: DC
  Administered 2019-02-23 – 2019-02-24 (×2): 137 ug via ORAL
  Filled 2019-02-22 (×6): qty 1

## 2019-02-22 MED ORDER — CARVEDILOL 3.125 MG PO TABS
3.1250 mg | ORAL_TABLET | Freq: Two times a day (BID) | ORAL | Status: DC
  Administered 2019-02-23 – 2019-02-24 (×3): 3.125 mg via ORAL
  Filled 2019-02-22 (×10): qty 1

## 2019-02-22 MED ORDER — SODIUM CHLORIDE 0.9 % IV SOLN
INTRAVENOUS | Status: DC
  Administered 2019-02-23: 01:00:00 via INTRAVENOUS

## 2019-02-22 MED ORDER — INSULIN GLARGINE 100 UNIT/ML ~~LOC~~ SOLN
20.0000 [IU] | Freq: Every day | SUBCUTANEOUS | Status: DC
  Administered 2019-02-23: 20 [IU] via SUBCUTANEOUS
  Filled 2019-02-22 (×4): qty 0.2

## 2019-02-22 MED ORDER — ONDANSETRON HCL 4 MG PO TABS: 4.0000 mg | ORAL_TABLET | Freq: Four times a day (QID) | ORAL | Status: DC | PRN

## 2019-02-22 MED ORDER — MIRTAZAPINE 15 MG PO TABS
15.0000 mg | ORAL_TABLET | Freq: Every day | ORAL | Status: DC
  Administered 2019-02-23 (×2): 15 mg via ORAL
  Filled 2019-02-22 (×2): qty 1

## 2019-02-22 MED ORDER — SODIUM CHLORIDE 0.9 % IV SOLN
1.0000 g | INTRAVENOUS | Status: DC
  Administered 2019-02-23: 1 g via INTRAVENOUS
  Filled 2019-02-22: qty 10

## 2019-02-22 MED ORDER — GABAPENTIN 300 MG PO CAPS
300.0000 mg | ORAL_CAPSULE | Freq: Three times a day (TID) | ORAL | Status: DC
  Administered 2019-02-23 – 2019-02-24 (×5): 300 mg via ORAL
  Filled 2019-02-22 (×5): qty 1

## 2019-02-22 MED ORDER — SODIUM CHLORIDE 0.9 % IV SOLN
2.0000 g | Freq: Once | INTRAVENOUS | Status: AC
  Administered 2019-02-22: 2 g via INTRAVENOUS
  Filled 2019-02-22: qty 20

## 2019-02-22 NOTE — H&P (Signed)
TRH H&P    Patient Demographics:    Debra [name], is a 85 y.o. female  MRN: [id number]  DOB - 04-28-1929  Admit Date - 02/22/2019  Referring MD/NP/PA: Nanda Quinton  Outpatient Primary MD for the patient is Sharilyn Sites, MD  Patient coming from: Home  Chief complaint-redness of left arm   HPI:    Debra [name]  is a 85 y.o. female, with history of CAD status post CABG, chronic systolic CHF, CKD stage IV, diabetes mellitus type 2, hypertension, gout came to ED with complaints of redness of left arm which is been spreading over the past 24 hours.  Patient says that bedroom door hit her left arm and after that she started having redness and warmth of left arm.  Surprisingly she denies any pain. Denies fever or chills. Denies nausea vomiting or diarrhea. Denies chest pain or shortness of breath. Denies abdominal pain or dysuria.  In the ED, WBC is 9.6, lactic acid 1.0    Review of systems:    In addition to the HPI above,    All other systems reviewed and are negative.    Past History of the following :    Past Medical History:  Diagnosis Date  . Arthritis   . CHF (congestive heart failure) ([place])   . CKD (chronic kidney disease) stage 4, GFR 15-29 ml/min (HCC)   . Colon cancer ([place])   . COPD (chronic obstructive pulmonary disease) ([place])   . Coronary artery disease    a. s/p CABG in 1997 (no detials provided) b. 12/2017: NSTEMI with cath showing patent LIMA-LAD with occluded SVG-OM and presumed occluded SVG-RCA but unable to be visualized, RCA was filled by collaterals. Medical management recommended.   . DDD (degenerative disc disease), lumbar   . Diabetic neuropathy ([place])   . GERD (gastroesophageal reflux disease)   . Gout   . Hypertension   . Restless leg syndrome   . Type 2 diabetes mellitus ([place])       Past Surgical History:  Procedure Laterality Date  . ABDOMINAL HYSTERECTOMY    .  CHOLECYSTECTOMY    . COLON RESECTION    . CORONARY ARTERY BYPASS GRAFT    . ILIAC ARTERY STENT    . LEFT HEART CATH AND CORS/GRAFTS ANGIOGRAPHY N/A 12/28/2017   Procedure: LEFT HEART CATH AND CORS/GRAFTS ANGIOGRAPHY;  Surgeon: Martinique, Peter M, MD;  Location: [place] CV LAB;  Service: Cardiovascular;  Laterality: N/A;  . LUNG SURGERY Left 2012   left lung resection  . RENAL ARTERY STENT    . THYROID SURGERY    . ULTRASOUND GUIDANCE FOR VASCULAR ACCESS  12/28/2017   Procedure: Ultrasound Guidance For Vascular Access;  Surgeon: Martinique, Peter M, MD;  Location: [place] CV LAB;  Service: Cardiovascular;;      Social History:      Social History   Tobacco Use  . Smoking status: Former Smoker    Quit date: 03/21/1996    Years since quitting: 22.9  . Smokeless tobacco: Never Used  Substance Use Topics  . Alcohol use: No  Family History :     Family History  Problem Relation Age of Onset  . Colon cancer Mother   . Heart disease Father   . Heart attack Sister   . Breast cancer Daughter       Home Medications:   Prior to Admission medications   Medication Sig Start Date End Date Taking? Authorizing Provider  ACCU-CHEK AVIVA PLUS test strip  07/11/18   [provider]  acetaminophen (TYLENOL) 325 MG tablet Take 650 mg by mouth every 6 (six) hours as needed for mild pain.    [provider]  albuterol (VENTOLIN HFA) 108 (90 Base) MCG/ACT inhaler Inhale 1 puff into the lungs every 6 (six) hours as needed for shortness of breath or wheezing.    [provider]  apixaban (ELIQUIS) 2.5 MG TABS tablet Take 1 tablet (2.5 mg total) by mouth 2 (two) times daily. 07/20/18   [name], MD  atorvastatin (LIPITOR) 80 MG tablet Take 1 tablet (80 mg total) by mouth daily at 6 PM. 12/31/17   Strader, [place] M, PA-C  Blood Glucose Monitoring Suppl (ACCU-CHEK AVIVA PLUS) w/Device KIT  07/11/18   [provider]  carvedilol (COREG) 3.125 MG  tablet Take 3.125 mg by mouth 2 (two) times daily with a meal.    [provider]  gabapentin (NEURONTIN) 100 MG capsule Take 300 mg by mouth 3 (three) times daily.  03/09/18   [provider]  HYDROcodone-acetaminophen (NORCO) 10-325 MG tablet Take 1 tablet by mouth every 6 (six) hours as needed for moderate pain or severe pain.    [provider]  insulin glargine (LANTUS) 100 unit/mL SOPN Inject 20 Units into the skin at bedtime.     [provider]  levothyroxine Gwenevere Abbot) 137 MCG tablet Take by mouth daily before breakfast.  06/09/18   [provider]  mirtazapine (REMERON) 15 MG tablet Take 15 mg by mouth at bedtime.    [provider]  pantoprazole (PROTONIX) 40 MG tablet Take 40 mg by mouth daily.  10/11/18   [provider]  rOPINIRole (REQUIP) 2 MG tablet  09/03/18   [provider]  sacubitril-valsartan (ENTRESTO) 24-26 MG Take 1 tablet by mouth 2 (two) times daily.  02/13/18   [provider]     Allergies:     Allergies  Allergen Reactions  . Ciprofloxacin Rash and Itching  . Iodine   . Iodinated Diagnostic Agents Rash     Physical Exam:   Vitals  Blood pressure (!) 165/57, pulse 63, temperature 98.5 F (36.9 C), temperature source Oral, resp. rate 19, weight 60.1 kg, SpO2 100 %.  1.  General: Appears in no acute distress  2. Psychiatric: Alert, oriented x3, intact insight and judgment  3. Neurologic: Cranial nerves II through XII grossly intact, motor strength 5/5 in all extremities  4. HEENMT:  Atraumatic normocephalic, extraocular muscles intact  5. Respiratory : Clear to auscultation bilaterally  6. Cardiovascular : S1-S2, regular, no murmur auscultated  7. Gastrointestinal:  Abdomen is soft, nontender, no organomegaly  8. Skin:  Erythema noted in left upper extremity extending from left forearm to midway up to right arm, warm to touch.  Nontender to palpation.     Data  Review:    CBC Recent Labs  Lab 02/22/19 2102  WBC 9.6  HGB 12.4  HCT 38.9  PLT 190  MCV 99.7  MCH 31.8  MCHC 31.9  RDW 15.2  LYMPHSABS 1.4  MONOABS 0.9  EOSABS 0.1  BASOSABS 0.0   ------------------------------------------------------------------------------------------------------------------  Results for orders placed or performed during the hospital encounter of 02/22/19 (from the past 48 hour(s))  CBG monitoring, ED     Status: Abnormal   Collection Time: 02/22/19  4:07 PM  Result Value Ref Range   Glucose-Capillary 105 (H) 70 - 99 mg/dL  SARS Coronavirus 2 [facility] order, Performed in [facility] hospital lab) Nasopharyngeal Nasopharyngeal Swab     Status: None   Collection Time: 02/22/19  8:44 PM   Specimen: Nasopharyngeal Swab  Result Value Ref Range   SARS Coronavirus 2 NEGATIVE NEGATIVE    Comment: (NOTE) If result is NEGATIVE SARS-CoV-2 target nucleic acids are NOT DETECTED. The SARS-CoV-2 RNA is generally detectable in upper and lower  respiratory specimens during the acute phase of infection. The lowest  concentration of SARS-CoV-2 viral copies this assay can detect is 250  copies / mL. A negative result does not preclude SARS-CoV-2 infection  and should not be used as the sole basis for treatment or other  patient management decisions.  A negative result may occur with  improper specimen collection / handling, submission of specimen other  than nasopharyngeal swab, presence of viral mutation(s) within the  areas targeted by this assay, and inadequate number of viral copies  (<250 copies / mL). A negative result must be combined with clinical  observations, patient history, and epidemiological information. If result is POSITIVE SARS-CoV-2 target nucleic acids are DETECTED. The SARS-CoV-2 RNA is generally detectable in upper and lower  respiratory specimens dur ing the acute phase of infection.  Positive  results are indicative of active infection with  SARS-CoV-2.  Clinical  correlation with patient history and other diagnostic information is  necessary to determine patient infection status.  Positive results do  not rule out bacterial infection or co-infection with other viruses. If result is PRESUMPTIVE POSTIVE SARS-CoV-2 nucleic acids MAY BE PRESENT.   A presumptive positive result was obtained on the submitted specimen  and confirmed on repeat testing.  While 2019 novel coronavirus  (SARS-CoV-2) nucleic acids may be present in the submitted sample  additional confirmatory testing may be necessary for epidemiological  and / or clinical management purposes  to differentiate between  SARS-CoV-2 and other Sarbecovirus currently known to infect humans.  If clinically indicated additional testing with an alternate test  methodology [phone]) is advised. The SARS-CoV-2 RNA is generally  detectable in upper and lower respiratory sp ecimens during the acute  phase of infection. The expected result is Negative. Fact Sheet for Patients:  StrictlyIdeas.no Fact Sheet for Healthcare Providers: BankingDealers.co.za This test is not yet approved or cleared by the [place] FDA and has been authorized for detection and/or diagnosis of SARS-CoV-2 by FDA under an Emergency Use Authorization (EUA).  This EUA will remain in effect (meaning this test can be used) for the duration of the COVID-19 declaration under Section 564(b)(1) of the Act, 21 U.S.C. section 360bbb-3(b)(1), unless the authorization is terminated or revoked sooner. Performed at [facility], [address] [place], [place] [address]   Comprehensive metabolic panel     Status: Abnormal   Collection Time: 02/22/19  9:02 PM  Result Value Ref Range   Sodium 139 135 - 145 mmol/L   Potassium 3.9 3.5 - 5.1 mmol/L   Chloride 107 98 - 111 mmol/L   CO2 23 22 - 32 mmol/L   Glucose, Bld 91 70 - 99 mg/dL   BUN 24 (H) 8 - 23 mg/dL  Creatinine, Ser 1.22 (H) 0.44 - 1.00 mg/dL   Calcium 9.0 8.9 - 10.3 mg/dL   Total Protein 6.0 (L) 6.5 - 8.1 g/dL   Albumin 3.3 (L) 3.5 - 5.0 g/dL   AST 19 15 - 41 U/L   ALT 15 0 - 44 U/L   Alkaline Phosphatase 85 38 - 126 U/L   Total Bilirubin 1.6 (H) 0.3 - 1.2 mg/dL   GFR calc non Af Amer 40 (L) >60 mL/min   GFR calc Af Amer 46 (L) >60 mL/min   Anion gap 9 5 - 15    Comment: Performed at [facility] : [facility], [address] [place], [place] [address]  CBC with Differential     Status: None   Collection Time: 02/22/19  9:02 PM  Result Value Ref Range   WBC 9.6 4.0 - 10.5 K/uL   RBC 3.90 3.87 - 5.11 MIL/uL   Hemoglobin 12.4 12.0 - 15.0 g/dL   HCT 38.9 36.0 - 46.0 %   MCV 99.7 80.0 - 100.0 fL   MCH 31.8 26.0 - 34.0 pg   MCHC 31.9 30.0 - 36.0 g/dL   RDW 15.2 11.5 - 15.5 %   Platelets 190 150 - 400 K/uL   nRBC 0.0 0.0 - 0.2 %   Neutrophils Relative % 75 %   Neutro Abs 7.2 1.7 - 7.7 K/uL   Lymphocytes Relative 15 %   Lymphs Abs 1.4 0.7 - 4.0 K/uL   Monocytes Relative 9 %   Monocytes Absolute 0.9 0.1 - 1.0 K/uL   Eosinophils Relative 1 %   Eosinophils Absolute 0.1 0.0 - 0.5 K/uL   Basophils Relative 0 %   Basophils Absolute 0.0 0.0 - 0.1 K/uL   Immature Granulocytes 0 %   Abs Immature Granulocytes 0.03 0.00 - 0.07 K/uL    Comment: Performed at [facility], [address] [place], [place] [address]  Protime-INR     Status: Abnormal   Collection Time: 02/22/19  9:02 PM  Result Value Ref Range   Prothrombin Time 15.5 (H) 11.4 - 15.2 seconds   INR 1.2 0.8 - 1.2    Comment: (NOTE) INR goal varies based on device and disease states. Performed at [facility], [address], [place], [place] [address]   Urinalysis, Routine w reflex microscopic     Status: Abnormal   Collection Time: 02/22/19  9:11 PM  Result Value Ref Range   Color, Urine YELLOW YELLOW   APPearance HAZY (A) CLEAR   Specific Gravity, Urine 1.011 1.005 - 1.030   pH 5.0 5.0 - 8.0   Glucose, UA NEGATIVE NEGATIVE mg/dL    Hgb urine dipstick NEGATIVE NEGATIVE   Bilirubin Urine NEGATIVE NEGATIVE   Ketones, ur NEGATIVE NEGATIVE mg/dL   Protein, ur NEGATIVE NEGATIVE mg/dL   Nitrite NEGATIVE NEGATIVE   Leukocytes,Ua TRACE (A) NEGATIVE   RBC / HPF 0-5 0 - 5 RBC/hpf   WBC, UA 6-10 0 - 5 WBC/hpf   Bacteria, UA RARE (A) NONE SEEN   Squamous Epithelial / LPF 0-5 0 - 5   Hyaline Casts, UA PRESENT     Comment: Performed at [facility], [address] [place], [place] [address]  Lactic acid, plasma     Status: None   Collection Time: 02/22/19  9:17 PM  Result Value Ref Range   Lactic Acid, Venous 1.0 0.5 - 1.9 mmol/L    Comment: Performed at [facility], [address], [place], [place] [address]    Chemistries  Recent  Labs  Lab 02/22/19 2102  NA 139  K 3.9  CL 107  CO2 23  GLUCOSE 91  BUN 24*  CREATININE 1.22*  CALCIUM 9.0  AST 19  ALT 15  ALKPHOS 85  BILITOT 1.6*   ------------------------------------------------------------------------------------------------------------------  ------------------------------------------------------------------------------------------------------------------ GFR: Estimated Creatinine Clearance: 30.8 mL/min (A) (by C-G formula based on SCr of 1.22 mg/dL (H)). Liver Function Tests: Recent Labs  Lab 02/22/19 2102  AST 19  ALT 15  ALKPHOS 85  BILITOT 1.6*  PROT 6.0*  ALBUMIN 3.3*   No results for input(s): LIPASE, AMYLASE in the last 168 hours. No results for input(s): AMMONIA in the last 168 hours. Coagulation Profile: Recent Labs  Lab 02/22/19 2102  INR 1.2   CBG: Recent Labs  Lab 02/22/19 1607  GLUCAP 105*    --------------------------------------------------------------------------------------------------------------- Urine analysis:    Component Value Date/Time   COLORURINE YELLOW 02/22/2019 2111   APPEARANCEUR HAZY (A) 02/22/2019 2111   LABSPEC 1.011 02/22/2019 2111   PHURINE 5.0 02/22/2019 2111   GLUCOSEU NEGATIVE 02/22/2019 2111    HGBUR NEGATIVE 02/22/2019 2111   BILIRUBINUR NEGATIVE 02/22/2019 2111   [place] NEGATIVE 02/22/2019 2111   PROTEINUR NEGATIVE 02/22/2019 2111   NITRITE NEGATIVE 02/22/2019 2111   LEUKOCYTESUR TRACE (A) 02/22/2019 2111      Imaging Results:      Assessment & Plan:    Active Problems:   Cellulitis   1. Left arm cellulitis-patient presenting with left arm cellulitis, started on vancomycin and Rocephin in the ED.  We will continue with Rocephin.  Follow blood culture results.  2. Diabetes mellitus type 2-continue Lantus 20 units subcu daily, sliding scale insulin with NovoLog.  Check CBG q. before meals and at bedtime.  3. Hypothyroidism-continue Synthroid.  4. CAD status post CABG-continue Coreg, Lipitor.  5. Chronic systolic CHF-continue Entresto.  6. Atrial fibrillation-heart rate is controlled, monitor on telemetry.  Continue Eliquis, Coreg.   DVT Prophylaxis-   Eliquis  AM Labs Ordered, also please review Full Orders  Family Communication: Admission, patients condition and plan of care including tests being ordered have been discussed with the patient  who indicate understanding and agree with the plan and Code Status.  Code Status: DNR  Admission status: Observation: Based on patients clinical presentation and evaluation of above clinical data, I have made determination that patient will need less than 2 midnight stay in the hospital.  Time spent in minutes : 60 minutes   Oswald Hillock M.D on 02/22/2019 at 11:29 PM

## 2019-02-22 NOTE — ED Notes (Signed)
Pt reports she was assurred by Dr Armandina Gemma that she would b3e admitted  She is here with daughter and upset that she was not directly put into a bed upstairs She was educated that she would be evaluated in Ed, seen by EDP, admitted thru hospitalist and then in room   She presents with Cellulitis to her L arm from shoulder to elbow for the last 2 days  Reports is started as a bruise She has been seen by her physician but not put on any antibiotics per her report She is upset that she has been here for so long in the waiting room and her daughter is equally frustrated

## 2019-02-22 NOTE — ED Triage Notes (Signed)
Patient with redness and edema to her L arm that started 2 days ago. Sent by PCP for cellulitis. Insulin dependent diabetic.

## 2019-02-22 NOTE — ED Notes (Signed)
Lab in room drawing cultures

## 2019-02-22 NOTE — ED Notes (Signed)
Physician to room

## 2019-02-22 NOTE — ED Notes (Signed)
Tracy(Daughter) Cell phone [phone]

## 2019-02-22 NOTE — Progress Notes (Addendum)
Pharmacy Antibiotic Note  Debra [name] is a 85 y.o. female admitted on 02/22/2019 with cellulitis.  Pharmacy has been consulted for vancomycin dosing.  Plan: vancomycin 1gm x 1 Vancomycin 750mg  IV every 24 hours.  Goal trough 10-15 mcg/mL.     Temp (24hrs), Avg:98.2 F (36.8 C), Min:98.2 F (36.8 C), Max:98.2 F (36.8 C)  No results for input(s): WBC, CREATININE, LATICACIDVEN, VANCOTROUGH, VANCOPEAK, VANCORANDOM, GENTTROUGH, GENTPEAK, GENTRANDOM, TOBRATROUGH, TOBRAPEAK, TOBRARND, AMIKACINPEAK, AMIKACINTROU, AMIKACIN in the last 168 hours.  CrCl cannot be calculated (Patient's most recent lab result is older than the maximum 21 days allowed.).    Allergies  Allergen Reactions  . Ciprofloxacin Rash and Itching  . Iodine   . Iodinated Diagnostic Agents Rash    Antimicrobials this admission: 8/20 vancomycin >>  8/20 ceftriaxone >>    Microbiology results: 8/20 BCx: sent 8/20 Covid 19 sent  Thank you for allowing pharmacy to be a part of this patient's care.  Donna Christen Keonna Raether 02/22/2019 9:00 PM

## 2019-02-22 NOTE — ED Provider Notes (Signed)
Emergency Department Provider Note   I have reviewed the triage vital signs and the nursing notes.   HISTORY  Chief Complaint Cellulitis   HPI HORTENCE CHARTER is a 85 y.o. female with PMH reviewed below including IDDM presents to the emergency department with right arm redness which is rapidly spreading over the past 24 hours.  Patient denies injury.  This morning she developed arm warmth and fever at home.  No chills.  Symptoms began underneath the left elbow yesterday and have now spread to include her forearm and half of her upper arm. Per family, patient was sent to the ED "to be direct admitted."    Past Medical History:  Diagnosis Date  . Arthritis   . CHF (congestive heart failure) ([place])   . CKD (chronic kidney disease) stage 4, GFR 15-29 ml/min (HCC)   . Colon cancer ([place])   . COPD (chronic obstructive pulmonary disease) ([place])   . Coronary artery disease    a. s/p CABG in 1997 (no detials provided) b. 12/2017: NSTEMI with cath showing patent LIMA-LAD with occluded SVG-OM and presumed occluded SVG-RCA but unable to be visualized, RCA was filled by collaterals. Medical management recommended.   . DDD (degenerative disc disease), lumbar   . Diabetic neuropathy ([place])   . GERD (gastroesophageal reflux disease)   . Gout   . Hypertension   . Restless leg syndrome   . Type 2 diabetes mellitus [facility])     Patient Active Problem List   Diagnosis Date Noted  . Cellulitis 02/22/2019  . Adjustment disorder with mixed anxiety and depressed mood 12/21/2018  . Acute on chronic systolic CHF (congestive heart failure) ([place]) 12/30/2017  . Atrial fibrillation with RVR ([place]) 12/30/2017  . NSTEMI (non-ST elevated myocardial infarction) ([place]) 12/26/2017  . Palpitations 10/03/2017  . Allergic rhinitis 09/20/2017  . Asthma 09/20/2017  . Carpal tunnel syndrome 09/20/2017  . Diabetes with neurologic complications ([place]) 94/76/5465  . Generalized osteoarthritis of hand 09/20/2017  . GERD  (gastroesophageal reflux disease) 09/20/2017  . Hiatal hernia 09/20/2017  . Positive ANA (antinuclear antibody) 09/20/2017  . Restless leg 09/20/2017  . Sciatica 09/20/2017  . General weakness 05/07/2017  . 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 UTI (urinary tract infection) 03/20/2017  . Hypertension 03/20/2017  . AKI (acute kidney injury) ([place]) 03/20/2017  . Chronic renal insufficiency, stage 3 (moderate) ([place]) 03/20/2017  . Rheumatoid arthritis ([place]) 03/20/2017  . Type 2 diabetes mellitus ([place]) 03/20/2017  . COPD (chronic obstructive pulmonary disease) ([place]) 06/03/2016  . Anxiety 05/09/2012  . Colon cancer ([place]) 03/01/2011  . CAD (coronary artery disease) 07/25/2010  . Hyperlipidemia, unspecified 07/25/2010  . Hypothyroidism 07/25/2010  . PAD (peripheral artery disease) ([place]) 07/25/2010    Past Surgical History:  Procedure Laterality Date  . ABDOMINAL HYSTERECTOMY    . CHOLECYSTECTOMY    . COLON RESECTION    . CORONARY ARTERY BYPASS GRAFT    . ILIAC ARTERY STENT    . LEFT HEART CATH AND CORS/GRAFTS ANGIOGRAPHY N/A 12/28/2017   Procedure: LEFT HEART CATH AND CORS/GRAFTS ANGIOGRAPHY;  Surgeon: Martinique, Peter M, MD;  Location: [facility];  Service: Cardiovascular;  Laterality: N/A;  . LUNG SURGERY Left 2012   left lung resection  . RENAL ARTERY STENT    . THYROID SURGERY    . ULTRASOUND GUIDANCE FOR VASCULAR ACCESS  12/28/2017   Procedure: Ultrasound Guidance For Vascular Access;  Surgeon: Martinique, Peter M, MD;  Location: [facility];  Service: Cardiovascular;;  Allergies Ciprofloxacin, Iodine, and Iodinated diagnostic agents  Family History  Problem Relation Age of Onset  . Colon cancer Mother   . Heart disease Father   . Heart attack Sister   . Breast cancer Daughter     Social History Social History   Tobacco Use  . Smoking status: Former Smoker    Quit date: 03/21/1996    Years since quitting: 22.9  . Smokeless tobacco: Never Used  Substance Use  Topics  . Alcohol use: No  . Drug use: No    Review of Systems  Constitutional: Positive fever/chills Eyes: No visual changes. ENT: No sore throat. Cardiovascular: Denies chest pain. Respiratory: Denies shortness of breath. Gastrointestinal: No abdominal pain.  No nausea, no vomiting.  No diarrhea.  No constipation. Genitourinary: Negative for dysuria. Musculoskeletal: Negative for back pain. Skin: Positive left arm rash.  Neurological: Negative for headaches, focal weakness or numbness.  10-point ROS otherwise negative.  ____________________________________________   PHYSICAL EXAM:  VITAL SIGNS: ED Triage Vitals [02/22/19 1604]  Enc Vitals Group     BP 118/85     Pulse Rate 74     Resp 18     Temp 98.2 F (36.8 C)     Temp Source Oral     SpO2 98 %   Constitutional: Alert and oriented. Well appearing and in no acute distress. Eyes: Conjunctivae are normal.  Head: Atraumatic. Nose: No congestion/rhinnorhea. Mouth/Throat: Mucous membranes are moist.  Neck: No stridor.  Cardiovascular: Normal rate, regular rhythm. Good peripheral circulation. Grossly normal heart sounds.   Respiratory: Normal respiratory effort.  No retractions. Lungs CTAB. Gastrointestinal: Soft and nontender. No distention.  Musculoskeletal: No lower extremity tenderness nor edema. No gross deformities of extremities. Neurologic:  Normal speech and language. No gross focal neurologic deficits are appreciated.  Skin:  Skin is warm, dry and intact. Erythema from the mid left forearm to the mid left humerus. No abscess. Warm to touch. No crepitus.    ____________________________________________   LABS (all labs ordered are listed, but only abnormal results are displayed)  Labs Reviewed  COMPREHENSIVE METABOLIC PANEL - Abnormal; Notable for the following components:      Result Value   BUN 24 (*)    Creatinine, Ser 1.22 (*)    Total Protein 6.0 (*)    Albumin 3.3 (*)    Total Bilirubin 1.6  (*)    GFR calc non Af Amer 40 (*)    GFR calc Af Amer 46 (*)    All other components within normal limits  PROTIME-INR - Abnormal; Notable for the following components:   Prothrombin Time 15.5 (*)    All other components within normal limits  URINALYSIS, ROUTINE W REFLEX MICROSCOPIC - Abnormal; Notable for the following components:   APPearance HAZY (*)    Leukocytes,Ua TRACE (*)    Bacteria, UA RARE (*)    All other components within normal limits  HEMOGLOBIN A1C - Abnormal; Notable for the following components:   Hgb A1c MFr Bld 6.5 (*)    All other components within normal limits  CBC - Abnormal; Notable for the following components:   RBC 3.44 (*)    Hemoglobin 10.9 (*)    HCT 34.2 (*)    All other components within normal limits  COMPREHENSIVE METABOLIC PANEL - Abnormal; Notable for the following components:   Glucose, Bld 67 (*)    Creatinine, Ser 1.22 (*)    Calcium 8.7 (*)    Total Protein 5.0 (*)  Albumin 2.6 (*)    AST 14 (*)    GFR calc non Af Amer 40 (*)    GFR calc Af Amer 46 (*)    All other components within normal limits  GLUCOSE, CAPILLARY - Abnormal; Notable for the following components:   Glucose-Capillary 177 (*)    All other components within normal limits  CBG MONITORING, ED - Abnormal; Notable for the following components:   Glucose-Capillary 105 (*)    All other components within normal limits  CBG MONITORING, ED - Abnormal; Notable for the following components:   Glucose-Capillary 104 (*)    All other components within normal limits  CBG MONITORING, ED - Abnormal; Notable for the following components:   Glucose-Capillary 56 (*)    All other components within normal limits  CBG MONITORING, ED - Abnormal; Notable for the following components:   Glucose-Capillary 68 (*)    All other components within normal limits  CBG MONITORING, ED - Abnormal; Notable for the following components:   Glucose-Capillary 123 (*)    All other components within  normal limits  CULTURE, BLOOD (ROUTINE X 2)  CULTURE, BLOOD (ROUTINE X 2)  SARS CORONAVIRUS 2 (HOSPITAL ORDER, [facility])  LACTIC ACID, PLASMA  CBC WITH DIFFERENTIAL/PLATELET   ____________________________________________   PROCEDURES  Procedure(s) performed:   Procedures  None ____________________________________________   INITIAL IMPRESSION / ASSESSMENT AND PLAN / ED COURSE  Pertinent labs & imaging results that were available during my care of the patient were reviewed by me and considered in my medical decision making (see chart for details).   Patient with left arm cellulitis. No arm swelling to suspect DVT. Not consistent with necrotizing fascitis. Labs reviewed. Family upset about not being "direct admitted." Apologized for wait and explained ED evaluation process.   Discussed patient's case with [facility] to request admission. Patient and family (if present) updated with plan. Care transferred to [facility] service.  I reviewed all nursing notes, vitals, pertinent old records, EKGs, labs, imaging (as available).  ____________________________________________  FINAL CLINICAL IMPRESSION(S) / ED DIAGNOSES  Final diagnoses:  Cellulitis of left upper extremity     MEDICATIONS GIVEN DURING THIS VISIT:  Medications  HYDROcodone-acetaminophen (NORCO) 10-325 MG per tablet 1 tablet (has no administration in time range)  atorvastatin (LIPITOR) tablet 80 mg (has no administration in time range)  carvedilol (COREG) tablet 3.125 mg (3.125 mg Oral Given 02/23/19 1026)  sacubitril-valsartan (ENTRESTO) 24-26 mg per tablet (1 tablet Oral Given 02/23/19 1019)  mirtazapine (REMERON) tablet 15 mg (15 mg Oral Given 02/23/19 0049)  insulin glargine (LANTUS) injection 20 Units (20 Units Subcutaneous Given 02/23/19 0048)  levothyroxine (SYNTHROID) tablet 137 mcg (137 mcg Oral Given 02/23/19 1026)  pantoprazole (PROTONIX) EC tablet 40 mg (40 mg Oral Given 02/23/19 1019)   apixaban (ELIQUIS) tablet 2.5 mg (2.5 mg Oral Given 02/23/19 1019)  gabapentin (NEURONTIN) capsule 300 mg (300 mg Oral Given 02/23/19 1019)  0.9 %  sodium chloride infusion ( Intravenous Rate/Dose Verify 02/23/19 0152)  ondansetron (ZOFRAN) tablet 4 mg (has no administration in time range)    Or  ondansetron (ZOFRAN) injection 4 mg (has no administration in time range)  cefTRIAXone (ROCEPHIN) 1 g in sodium chloride 0.9 % 100 mL IVPB (has no administration in time range)  insulin aspart (novoLOG) injection 0-9 Units (2 Units Subcutaneous Given 02/23/19 1158)  vancomycin (VANCOCIN) IVPB 750 mg/150 ml premix (has no administration in time range)  albuterol (PROVENTIL) (2.5 MG/3ML) 0.083% nebulizer solution 2.5  mg (has no administration in time range)  vancomycin (VANCOCIN) IVPB 1000 mg/200 mL premix (0 mg Intravenous Stopped 02/22/19 2326)  cefTRIAXone (ROCEPHIN) 2 g in sodium chloride 0.9 % 100 mL IVPB (0 g Intravenous Stopped 02/22/19 2229)  HYDROcodone-acetaminophen (NORCO/VICODIN) 5-325 MG per tablet 2 tablet (2 tablets Oral Given 02/22/19 2126)    Note:  This document was prepared using Dragon voice recognition software and may include unintentional dictation errors.  Nanda Quinton, MD Emergency Medicine    Long, Wonda Old[facility], MD 02/23/19 1229

## 2019-02-23 ENCOUNTER — Encounter (HOSPITAL_COMMUNITY): Payer: Self-pay

## 2019-02-23 DIAGNOSIS — I5043 Acute on chronic combined systolic (congestive) and diastolic (congestive) heart failure: Secondary | ICD-10-CM | POA: Diagnosis not present

## 2019-02-23 DIAGNOSIS — E1121 Type 2 diabetes mellitus with diabetic nephropathy: Secondary | ICD-10-CM

## 2019-02-23 DIAGNOSIS — E039 Hypothyroidism, unspecified: Secondary | ICD-10-CM

## 2019-02-23 DIAGNOSIS — L03114 Cellulitis of left upper limb: Secondary | ICD-10-CM | POA: Diagnosis not present

## 2019-02-23 LAB — BLOOD CULTURE ID PANEL (REFLEXED)

## 2019-02-23 LAB — COMPREHENSIVE METABOLIC PANEL
ALT: 13 U/L (ref 0–44)
AST: 14 U/L — ABNORMAL LOW (ref 15–41)
Albumin: 2.6 g/dL — ABNORMAL LOW (ref 3.5–5.0)
Alkaline Phosphatase: 64 U/L (ref 38–126)
Anion gap: 7 (ref 5–15)
BUN: 22 mg/dL (ref 8–23)
CO2: 25 mmol/L (ref 22–32)
Calcium: 8.7 mg/dL — ABNORMAL LOW (ref 8.9–10.3)
Chloride: 110 mmol/L (ref 98–111)
Creatinine, Ser: 1.22 mg/dL — ABNORMAL HIGH (ref 0.44–1.00)
GFR calc Af Amer: 46 mL/min — ABNORMAL LOW (ref >60)
GFR calc non Af Amer: 40 mL/min — ABNORMAL LOW (ref >60)
Glucose, Bld: 67 mg/dL — ABNORMAL LOW (ref 70–99)
Potassium: 3.5 mmol/L (ref 3.5–5.1)
Sodium: 142 mmol/L (ref 135–145)
Total Bilirubin: 0.9 mg/dL (ref 0.3–1.2)
Total Protein: 5 g/dL — ABNORMAL LOW (ref 6.5–8.1)

## 2019-02-23 LAB — CBG MONITORING, ED
Glucose-Capillary: 104 mg/dL — ABNORMAL HIGH (ref 70–99)
Glucose-Capillary: 123 mg/dL — ABNORMAL HIGH (ref 70–99)
Glucose-Capillary: 56 mg/dL — ABNORMAL LOW (ref 70–99)
Glucose-Capillary: 68 mg/dL — ABNORMAL LOW (ref 70–99)

## 2019-02-23 LAB — GLUCOSE, CAPILLARY
Glucose-Capillary: 177 mg/dL — ABNORMAL HIGH (ref 70–99)
Glucose-Capillary: 102 mg/dL — ABNORMAL HIGH (ref 70–99)
Glucose-Capillary: 155 mg/dL — ABNORMAL HIGH (ref 70–99)

## 2019-02-23 LAB — CBC
HCT: 34.2 % — ABNORMAL LOW (ref 36.0–46.0)
Hemoglobin: 10.9 g/dL — ABNORMAL LOW (ref 12.0–15.0)
MCH: 31.7 pg (ref 26.0–34.0)
MCHC: 31.9 g/dL (ref 30.0–36.0)
MCV: 99.4 fL (ref 80.0–100.0)
Platelets: 163 10*3/uL (ref 150–400)
RBC: 3.44 MIL/uL — ABNORMAL LOW (ref 3.87–5.11)
RDW: 14.8 % (ref 11.5–15.5)
WBC: 6.7 10*3/uL (ref 4.0–10.5)
nRBC: 0 % (ref 0.0–0.2)

## 2019-02-23 LAB — HEMOGLOBIN A1C
Hgb A1c MFr Bld: 6.5 % — ABNORMAL HIGH (ref 4.8–5.6)
Mean Plasma Glucose: 139.85 mg/dL

## 2019-02-23 MED ORDER — HYDROCODONE-ACETAMINOPHEN 10-325 MG PO TABS
1.0000 | ORAL_TABLET | Freq: Three times a day (TID) | ORAL | Status: DC | PRN
  Administered 2019-02-23: 1 via ORAL
  Filled 2019-02-23 (×2): qty 1

## 2019-02-23 MED ORDER — ACETAMINOPHEN 325 MG PO TABS
650.0000 mg | ORAL_TABLET | Freq: Four times a day (QID) | ORAL | Status: DC | PRN
  Administered 2019-02-23: 650 mg via ORAL
  Filled 2019-02-23: qty 2

## 2019-02-23 MED ORDER — ALBUTEROL SULFATE (2.5 MG/3ML) 0.083% IN NEBU: 2.5000 mg | INHALATION_SOLUTION | Freq: Four times a day (QID) | RESPIRATORY_TRACT | Status: DC | PRN

## 2019-02-23 MED ORDER — INSULIN GLARGINE 100 UNIT/ML ~~LOC~~ SOLN
10.0000 [IU] | Freq: Every day | SUBCUTANEOUS | Status: DC
  Administered 2019-02-23: 10 [IU] via SUBCUTANEOUS
  Filled 2019-02-23 (×3): qty 0.1

## 2019-02-23 MED ORDER — VANCOMYCIN HCL IN DEXTROSE 750-5 MG/150ML-% IV SOLN: 750.0000 mg | INTRAVENOUS | Status: DC

## 2019-02-23 NOTE — ED Notes (Signed)
Pt given juice, graham crackers and peanut butter for blood glucose of 67 per labwork.

## 2019-02-23 NOTE — Progress Notes (Signed)
CRITICAL VALUE ALERT  Critical Value:  Corrected blood culture results, 1 of 4 bottles gram negative rods aerobic bottle. Identification serratia marcescens.  Date & Time Notied:  02/23/19 2131  Provider Notified: [place], [place]  Orders Received/Actions taken: No new orders at this time

## 2019-02-23 NOTE — Progress Notes (Signed)
Inpatient Diabetes Program Recommendations  AACE/ADA: New Consensus Statement on Inpatient Glycemic Control (2015)  Target Ranges:  Prepandial:   less than 140 mg/dL      Peak postprandial:   less than 180 mg/dL (1-2 hours)      Critically ill patients:  140 - 180 mg/dL   Lab Results  Component Value Date   GLUCAP 177 (H) 02/23/2019   HGBA1C 6.5 (H) 02/22/2019    Review of Glycemic Control Results for Debra [name], Debra [name] (MRN [id number]) as of 02/23/2019 12:35  Ref. Range 02/23/2019 00:39 02/23/2019 06:39 02/23/2019 06:55 02/23/2019 07:53 02/23/2019 11:16  Glucose-Capillary Latest Ref Range: 70 - 99 mg/dL 104 (H) 56 (L) 68 (L) 123 (H) 177 (H)   Diabetes history: DM2 Outpatient Diabetes medications: Lantus 20 units qd Current orders for Inpatient glycemic control: Lantus 20 units qd Novolog sensitive correction tid  Inpatient Diabetes Program Recommendations:   -Decrease Lantus to 10 units daily while in the hospital  Thank you, Nani Gasser. Lanessa Shill, RN, MSN, CDE  Diabetes Coordinator Inpatient Glycemic Control Team Team Pager [phone] (8am-5pm) 02/23/2019 1:02 PM

## 2019-02-23 NOTE — ED Notes (Addendum)
ED TO INPATIENT HANDOFF REPORT  ED Nurse Name and Phone #: Antony Blackbird RN [phone]  S Name/Age/Gender Debra [name] 85 y.o. female Room/Bed: APA14/APA14  Code Status   Code Status: DNR  Home/SNF/Other Home Patient oriented to: self, place, time and situation Is this baseline? Yes   Triage Complete: Triage complete  Chief Complaint Cellulitis  Triage Note Patient with redness and edema to her L arm that started 2 days ago. Sent by PCP for cellulitis. Insulin dependent diabetic.   Allergies Allergies  Allergen Reactions  . Ciprofloxacin Rash and Itching  . Iodine   . Iodinated Diagnostic Agents Rash    Level of Care/Admitting Diagnosis ED Disposition    ED Disposition Condition [facility]: [facility] [[id number]]  Level of Care: Telemetry [5]  Covid Evaluation: Asymptomatic Screening Protocol (No Symptoms)  Diagnosis: Cellulitis [824235]  Admitting Physician: Loree Fee  Attending Physician: Oswald Hillock [[id number]  PT Class (Do Not Modify): Observation [104]  PT Acc Code (Do Not Modify): Observation [10022]       B Medical/Surgery History Past Medical History:  Diagnosis Date  . Arthritis   . CHF (congestive heart failure) ([place])   . CKD (chronic kidney disease) stage 4, GFR 15-29 ml/min (HCC)   . Colon cancer ([place])   . COPD (chronic obstructive pulmonary disease) ([place])   . Coronary artery disease    a. s/p CABG in 1997 (no detials provided) b. 12/2017: NSTEMI with cath showing patent LIMA-LAD with occluded SVG-OM and presumed occluded SVG-RCA but unable to be visualized, RCA was filled by collaterals. Medical management recommended.   . DDD (degenerative disc disease), lumbar   . Diabetic neuropathy ([place])   . GERD (gastroesophageal reflux disease)   . Gout   . Hypertension   . Restless leg syndrome   . Type 2 diabetes mellitus ([place])    Past Surgical History:  Procedure Laterality Date  . ABDOMINAL HYSTERECTOMY    .  CHOLECYSTECTOMY    . COLON RESECTION    . CORONARY ARTERY BYPASS GRAFT    . ILIAC ARTERY STENT    . LEFT HEART CATH AND CORS/GRAFTS ANGIOGRAPHY N/A 12/28/2017   Procedure: LEFT HEART CATH AND CORS/GRAFTS ANGIOGRAPHY;  Surgeon: Martinique, Peter M, MD;  Location: [facility];  Service: Cardiovascular;  Laterality: N/A;  . LUNG SURGERY Left 2012   left lung resection  . RENAL ARTERY STENT    . THYROID SURGERY    . ULTRASOUND GUIDANCE FOR VASCULAR ACCESS  12/28/2017   Procedure: Ultrasound Guidance For Vascular Access;  Surgeon: Martinique, Peter M, MD;  Location: [facility];  Service: Cardiovascular;;     A IV Location/Drains/Wounds Patient Lines/Drains/Airways Status   Active Line/Drains/Airways    Name:   Placement date:   Placement time:   Site:   Days:   Peripheral IV 02/22/19 Right Wrist   02/22/19    2109    Wrist   1          Intake/Output Last 24 hours  Intake/Output Summary (Last 24 hours) at 02/23/2019 3614 Last data filed at 02/23/2019 0152 Gross per 24 hour  Intake 9.28 ml  Output -  Net 9.28 ml    Labs/Imaging Results for orders placed or performed during the hospital encounter of 02/22/19 (from the past 48 hour(s))  CBG monitoring, ED     Status: Abnormal   Collection Time: 02/22/19  4:07 PM  Result Value Ref  Range   Glucose-Capillary 105 (H) 70 - 99 mg/dL  SARS Coronavirus 2 [facility] order, Performed in [facility] hospital lab) Nasopharyngeal Nasopharyngeal Swab     Status: None   Collection Time: 02/22/19  8:44 PM   Specimen: Nasopharyngeal Swab  Result Value Ref Range   SARS Coronavirus 2 NEGATIVE NEGATIVE    Comment: (NOTE) If result is NEGATIVE SARS-CoV-2 target nucleic acids are NOT DETECTED. The SARS-CoV-2 RNA is generally detectable in upper and lower  respiratory specimens during the acute phase of infection. The lowest  concentration of SARS-CoV-2 viral copies this assay can detect is 250  copies / mL. A negative result does not preclude  SARS-CoV-2 infection  and should not be used as the sole basis for treatment or other  patient management decisions.  A negative result may occur with  improper specimen collection / handling, submission of specimen other  than nasopharyngeal swab, presence of viral mutation(s) within the  areas targeted by this assay, and inadequate number of viral copies  (<250 copies / mL). A negative result must be combined with clinical  observations, patient history, and epidemiological information. If result is POSITIVE SARS-CoV-2 target nucleic acids are DETECTED. The SARS-CoV-2 RNA is generally detectable in upper and lower  respiratory specimens dur ing the acute phase of infection.  Positive  results are indicative of active infection with SARS-CoV-2.  Clinical  correlation with patient history and other diagnostic information is  necessary to determine patient infection status.  Positive results do  not rule out bacterial infection or co-infection with other viruses. If result is PRESUMPTIVE POSTIVE SARS-CoV-2 nucleic acids MAY BE PRESENT.   A presumptive positive result was obtained on the submitted specimen  and confirmed on repeat testing.  While 2019 novel coronavirus  (SARS-CoV-2) nucleic acids may be present in the submitted sample  additional confirmatory testing may be necessary for epidemiological  and / or clinical management purposes  to differentiate between  SARS-CoV-2 and other Sarbecovirus currently known to infect humans.  If clinically indicated additional testing with an alternate test  methodology [phone]) is advised. The SARS-CoV-2 RNA is generally  detectable in upper and lower respiratory sp ecimens during the acute  phase of infection. The expected result is Negative. Fact Sheet for Patients:  StrictlyIdeas.no Fact Sheet for Healthcare Providers: BankingDealers.co.za This test is not yet approved or cleared by the  [place] FDA and has been authorized for detection and/or diagnosis of SARS-CoV-2 by FDA under an Emergency Use Authorization (EUA).  This EUA will remain in effect (meaning this test can be used) for the duration of the COVID-19 declaration under Section 564(b)(1) of the Act, 21 U.S.C. section 360bbb-3(b)(1), unless the authorization is terminated or revoked sooner. Performed at [facility], [address] [place], [place] [address]   Comprehensive metabolic panel     Status: Abnormal   Collection Time: 02/22/19  9:02 PM  Result Value Ref Range   Sodium 139 135 - 145 mmol/L   Potassium 3.9 3.5 - 5.1 mmol/L   Chloride 107 98 - 111 mmol/L   CO2 23 22 - 32 mmol/L   Glucose, Bld 91 70 - 99 mg/dL   BUN 24 (H) 8 - 23 mg/dL   Creatinine, Ser 1.22 (H) 0.44 - 1.00 mg/dL   Calcium 9.0 8.9 - 10.3 mg/dL   Total Protein 6.0 (L) 6.5 - 8.1 g/dL   Albumin 3.3 (L) 3.5 - 5.0 g/dL   AST 19 15 - 41 U/L  ALT 15 0 - 44 U/L   Alkaline Phosphatase 85 38 - 126 U/L   Total Bilirubin 1.6 (H) 0.3 - 1.2 mg/dL   GFR calc non Af Amer 40 (L) >60 mL/min   GFR calc Af Amer 46 (L) >60 mL/min   Anion gap 9 5 - 15    Comment: Performed at [facility], [address] [place], [place] [address]  CBC with Differential     Status: None   Collection Time: 02/22/19  9:02 PM  Result Value Ref Range   WBC 9.6 4.0 - 10.5 K/uL   RBC 3.90 3.87 - 5.11 MIL/uL   Hemoglobin 12.4 12.0 - 15.0 g/dL   HCT 38.9 36.0 - 46.0 %   MCV 99.7 80.0 - 100.0 fL   MCH 31.8 26.0 - 34.0 pg   MCHC 31.9 30.0 - 36.0 g/dL   RDW 15.2 11.5 - 15.5 %   Platelets 190 150 - 400 K/uL   nRBC 0.0 0.0 - 0.2 %   Neutrophils Relative % 75 %   Neutro Abs 7.2 1.7 - 7.7 K/uL   Lymphocytes Relative 15 %   Lymphs Abs 1.4 0.7 - 4.0 K/uL   Monocytes Relative 9 %   Monocytes Absolute 0.9 0.1 - 1.0 K/uL   Eosinophils Relative 1 %   Eosinophils Absolute 0.1 0.0 - 0.5 K/uL   Basophils Relative 0 %   Basophils Absolute 0.0 0.0 - 0.1 K/uL   Immature  Granulocytes 0 %   Abs Immature Granulocytes 0.03 0.00 - 0.07 K/uL    Comment: Performed at [facility], [address] [place], [place] [address]  Protime-INR     Status: Abnormal   Collection Time: 02/22/19  9:02 PM  Result Value Ref Range   Prothrombin Time 15.5 (H) 11.4 - 15.2 seconds   INR 1.2 0.8 - 1.2    Comment: (NOTE) INR goal varies based on device and disease states. Performed at [facility], [address], [place], [place] [address]   Urinalysis, Routine w reflex microscopic     Status: Abnormal   Collection Time: 02/22/19  9:11 PM  Result Value Ref Range   Color, Urine YELLOW YELLOW   APPearance HAZY (A) CLEAR   Specific Gravity, Urine 1.011 1.005 - 1.030   pH 5.0 5.0 - 8.0   Glucose, UA NEGATIVE NEGATIVE mg/dL   Hgb urine dipstick NEGATIVE NEGATIVE   Bilirubin Urine NEGATIVE NEGATIVE   Ketones, ur NEGATIVE NEGATIVE mg/dL   Protein, ur NEGATIVE NEGATIVE mg/dL   Nitrite NEGATIVE NEGATIVE   Leukocytes,Ua TRACE (A) NEGATIVE   RBC / HPF 0-5 0 - 5 RBC/hpf   WBC, UA 6-10 0 - 5 WBC/hpf   Bacteria, UA RARE (A) NONE SEEN   Squamous Epithelial / LPF 0-5 0 - 5   Hyaline Casts, UA PRESENT     Comment: Performed at [facility], [address] [place], [place] [address]  Lactic acid, plasma     Status: None   Collection Time: 02/22/19  9:17 PM  Result Value Ref Range   Lactic Acid, Venous 1.0 0.5 - 1.9 mmol/L    Comment: Performed at [facility], [address] [place], [place] [address]  CBG monitoring, ED     Status: Abnormal   Collection Time: 02/23/19 12:39 AM  Result Value Ref Range   Glucose-Capillary 104 (H) 70 - 99 mg/dL   Comment 1 Notify RN    Comment 2 Document in Chart   CBC  Status: Abnormal   Collection Time: 02/23/19  5:18 AM  Result Value Ref Range   WBC 6.7 4.0 - 10.5 K/uL   RBC 3.44 (L) 3.87 - 5.11 MIL/uL   Hemoglobin 10.9 (L) 12.0 - 15.0 g/dL   HCT 34.2 (L) 36.0 - 46.0 %   MCV 99.4 80.0 - 100.0 fL   MCH 31.7 26.0 - 34.0 pg   MCHC 31.9 30.0  - 36.0 g/dL   RDW 14.8 11.5 - 15.5 %   Platelets 163 150 - 400 K/uL   nRBC 0.0 0.0 - 0.2 %    Comment: Performed at [facility], [address] [place], [place] [address]  Comprehensive metabolic panel     Status: Abnormal   Collection Time: 02/23/19  5:18 AM  Result Value Ref Range   Sodium 142 135 - 145 mmol/L   Potassium 3.5 3.5 - 5.1 mmol/L   Chloride 110 98 - 111 mmol/L   CO2 25 22 - 32 mmol/L   Glucose, Bld 67 (L) 70 - 99 mg/dL   BUN 22 8 - 23 mg/dL   Creatinine, Ser 1.22 (H) 0.44 - 1.00 mg/dL   Calcium 8.7 (L) 8.9 - 10.3 mg/dL   Total Protein 5.0 (L) 6.5 - 8.1 g/dL   Albumin 2.6 (L) 3.5 - 5.0 g/dL   AST 14 (L) 15 - 41 U/L   ALT 13 0 - 44 U/L   Alkaline Phosphatase 64 38 - 126 U/L   Total Bilirubin 0.9 0.3 - 1.2 mg/dL   GFR calc non Af Amer 40 (L) >60 mL/min   GFR calc Af Amer 46 (L) >60 mL/min   Anion gap 7 5 - 15    Comment: Performed at [facility], [address], [place], [place] [address]   No results found.  Pending Labs Unresulted Labs (From admission, onward)    Start     Ordered   02/22/19 2343  Hemoglobin A1c  Once,   STAT    Comments: To assess prior glycemic control    02/22/19 2342   02/22/19 2043  Culture, blood (routine x 2)  BLOOD CULTURE X 2,   STAT     02/22/19 2043          Vitals/Pain Today's Vitals   02/22/19 2332 02/23/19 0100 02/23/19 0403 02/23/19 0542  BP:    (!) 162/59  Pulse:    72  Resp:    18  Temp:    98.3 F (36.8 C)  TempSrc:    Oral  SpO2:    98%  Weight:      PainSc: 0-No pain 0-No pain Asleep     Isolation Precautions No active isolations  Medications Medications  HYDROcodone-acetaminophen (NORCO) 10-325 MG per tablet 1 tablet (has no administration in time range)  atorvastatin (LIPITOR) tablet 80 mg (has no administration in time range)  carvedilol (COREG) tablet 3.125 mg (has no administration in time range)  sacubitril-valsartan (ENTRESTO) 24-26 mg per tablet (1 tablet Oral Given 02/23/19 0048)  mirtazapine  (REMERON) tablet 15 mg (15 mg Oral Given 02/23/19 0049)  insulin glargine (LANTUS) injection 20 Units (20 Units Subcutaneous Given 02/23/19 0048)  levothyroxine (SYNTHROID) tablet 137 mcg (has no administration in time range)  pantoprazole (PROTONIX) EC tablet 40 mg (has no administration in time range)  apixaban (ELIQUIS) tablet 2.5 mg (2.5 mg Oral Given 02/23/19 0049)  gabapentin (NEURONTIN) capsule 300 mg (300 mg Oral Given 02/23/19 0057)  albuterol (VENTOLIN HFA) 108 (90 Base) MCG/ACT inhaler 1  puff (has no administration in time range)  0.9 %  sodium chloride infusion ( Intravenous Rate/Dose Verify 02/23/19 0152)  ondansetron (ZOFRAN) tablet 4 mg (has no administration in time range)    Or  ondansetron (ZOFRAN) injection 4 mg (has no administration in time range)  cefTRIAXone (ROCEPHIN) 1 g in sodium chloride 0.9 % 100 mL IVPB (has no administration in time range)  insulin aspart (novoLOG) injection 0-9 Units (has no administration in time range)  vancomycin (VANCOCIN) IVPB 750 mg/150 ml premix (has no administration in time range)  vancomycin (VANCOCIN) IVPB 1000 mg/200 mL premix (0 mg Intravenous Stopped 02/22/19 2326)  cefTRIAXone (ROCEPHIN) 2 g in sodium chloride 0.9 % 100 mL IVPB (0 g Intravenous Stopped 02/22/19 2229)  HYDROcodone-acetaminophen (NORCO/VICODIN) 5-325 MG per tablet 2 tablet (2 tablets Oral Given 02/22/19 2126)    Mobility walks with device Low fall risk   Focused Assessments    R Recommendations: See Admitting Provider Note  Report given to:   Additional Notes:

## 2019-02-23 NOTE — Progress Notes (Signed)
PROGRESS NOTE    Debra [name]  TIW:[id number] DOB: [birth year] DOA: 02/22/2019 PCP: Sharilyn Sites, MD     Brief Narrative:  85 y.o. female, with history of CAD status post CABG, chronic systolic CHF, CKD stage IV, diabetes mellitus type 2, hypertension, gout came to ED with complaints of redness of left arm which is been spreading over the past 24 hours.  Patient says that bedroom door hit her left arm and after that she started having redness and warmth of left arm.  Surprisingly she denies any pain. Denies fever or chills. Denies nausea vomiting or diarrhea. Denies chest pain or shortness of breath. Denies abdominal pain or dysuria.  In the ED, WBC is 9.6, lactic acid 1.0   Assessment & Plan: 1-left lower extremity cellulitis -No open wounds or drainage appreciated -Still with swelling and erythema appreciated on exam; warmth sensation but no pain reported. -Continue current antibiotics -Apply cold compresses and maintain limb elevated -Continue PRN analgesics. -Follow clinical response; if stable-most likely able to be discharged tomorrow 02/24/2019.  2-paroxysmal atrial fibrillation -Rate control -Continue the use of carvedilol and Eliquis for secondary prevention -Given rate and rhythm instability will discontinue telemetry.  3-chronic systolic and diastolic heart failure -Stable and compensated -Most recent ejection fraction 40 to 45% -Continue Entresto and carvedilol -Follow daily weights, strict I's and O's and low-sodium diet.  4-hypothyroidism -Continue Synthroid  5-type 2 diabetes with nephropathy: Experiencing hypoglycemia overnight. -Continue adjusted dose of Lantus and follow sliding scale insulin. -Patient advised to avoid skipping meals.  6-GERD -Continue Protonix  7-diabetic neuropathy -Continue Neurontin  8-chronic kidney disease is stage III -Stable and compensated -Creatinine at baseline -Continue monitoring electrolytes and renal function  trend.  DVT prophylaxis: Chronically on Eliquis. Code Status: DNR/DNI Family Communication: Daughter updated over the phone. Disposition Plan: Continue IV antibiotics for another 24 hours, apply cold compresses and keep left upper extremity elevated as much as possible.  Patient improving and making progress.  Developed hypoglycemia overnight, will adjust insulin therapy and follow CBGs.  Consultants:   None  Procedures:   None  Antimicrobials:  Anti-infectives (From admission, onward)   Start     Dose/Rate Route Frequency Ordered Stop   02/23/19 2100  vancomycin (VANCOCIN) IVPB 750 mg/150 ml premix     750 mg 150 mL/hr over 60 Minutes Intravenous Every 24 hours 02/23/19 0626     02/23/19 2000  cefTRIAXone (ROCEPHIN) 1 g in sodium chloride 0.9 % 100 mL IVPB     1 g 200 mL/hr over 30 Minutes Intravenous Every 24 hours 02/22/19 2343     02/22/19 2045  vancomycin (VANCOCIN) IVPB 1000 mg/200 mL premix     1,000 mg 200 mL/hr over 60 Minutes Intravenous  Once 02/22/19 2044 02/22/19 2326   02/22/19 2045  cefTRIAXone (ROCEPHIN) 2 g in sodium chloride 0.9 % 100 mL IVPB     2 g 200 mL/hr over 30 Minutes Intravenous  Once 02/22/19 2044 02/22/19 2229       Subjective: Afebrile, no chest pain, no nausea, no vomiting, no shortness of breath.  Left upper extremity with erythematous changes, warmth sensation and is still swelling.  She developed hypoglycemic event overnight. .  Objective: Vitals:   02/22/19 2232 02/23/19 0542 02/23/19 0934 02/23/19 1011  BP: (!) 165/57 (!) 162/59 140/74   Pulse: 63 72 65   Resp: 19 18 16    Temp: 98.5 F (36.9 C) 98.3 F (36.8 C) 98.4 F (36.9 C)   TempSrc: Oral Oral  Oral   SpO2: 100% 98% 98%   Weight:   61.2 kg 61.5 kg  Height:    5\' 6"  (1.676 m)    Intake/Output Summary (Last 24 hours) at 02/23/2019 1347 Last data filed at 02/23/2019 0152 Gross per 24 hour  Intake 9.28 ml  Output -  Net 9.28 ml   Filed Weights   02/22/19 2104 02/23/19  0934 02/23/19 1011  Weight: 60.1 kg 61.2 kg 61.5 kg    Examination: General exam: Alert, awake, oriented x 3; denies chest pain, shortness of breath, nausea or vomiting.  Still with swelling, erythema and warmth sensation on her left upper extremity. Respiratory system: Clear to auscultation. Respiratory effort normal. Cardiovascular system:Rate controlled.  No rubs, no gallops, no JVD.  Gastrointestinal system: Abdomen is nondistended, soft and nontender. No organomegaly or masses felt. Normal bowel sounds heard. Central nervous system: Alert and oriented. No focal neurological deficits. Extremities/skin: Erythematous changes, swelling and warmth sensation on palpation affecting left upper extremity forearm to mid upper arm.  No joint swelling or decreased range of motion appreciated.  There is no open wounds or drainage on examination.  Patient reports no pain on palpation.  Lower extremity without cyanosis, clubbing or edema. Psychiatry: Judgement and insight appear normal. Mood & affect appropriate.     Data Reviewed: I have personally reviewed following labs and imaging studies  CBC: Recent Labs  Lab 02/22/19 2102 02/23/19 0518  WBC 9.6 6.7  NEUTROABS 7.2  --   HGB 12.4 10.9*  HCT 38.9 34.2*  MCV 99.7 99.4  PLT 190 505   Basic Metabolic Panel: Recent Labs  Lab 02/22/19 2102 02/23/19 0518  NA 139 142  K 3.9 3.5  CL 107 110  CO2 23 25  GLUCOSE 91 67*  BUN 24* 22  CREATININE 1.22* 1.22*  CALCIUM 9.0 8.7*   GFR: Estimated Creatinine Clearance: 30.4 mL/min (A) (by C-G formula based on SCr of 1.22 mg/dL (H)).   Liver Function Tests: Recent Labs  Lab 02/22/19 2102 02/23/19 0518  AST 19 14*  ALT 15 13  ALKPHOS 85 64  BILITOT 1.6* 0.9  PROT 6.0* 5.0*  ALBUMIN 3.3* 2.6*   Coagulation Profile: Recent Labs  Lab 02/22/19 2102  INR 1.2   HbA1C: Recent Labs    02/22/19 2102  HGBA1C 6.5*   CBG: Recent Labs  Lab 02/23/19 0039 02/23/19 0639 02/23/19 0655  02/23/19 0753 02/23/19 1116  GLUCAP 104* 56* 68* 123* 177*   Urine analysis:    Component Value Date/Time   COLORURINE YELLOW 02/22/2019 2111   APPEARANCEUR HAZY (A) 02/22/2019 2111   LABSPEC 1.011 02/22/2019 2111   PHURINE 5.0 02/22/2019 2111   GLUCOSEU NEGATIVE 02/22/2019 2111   HGBUR NEGATIVE 02/22/2019 2111   BILIRUBINUR NEGATIVE 02/22/2019 2111   KETONESUR NEGATIVE 02/22/2019 2111   PROTEINUR NEGATIVE 02/22/2019 2111   NITRITE NEGATIVE 02/22/2019 2111   LEUKOCYTESUR TRACE (A) 02/22/2019 2111    Recent Results (from the past 240 hour(s))  SARS Coronavirus 2 [facility] order, Performed in [facility] hospital lab) Nasopharyngeal Nasopharyngeal Swab     Status: None   Collection Time: 02/22/19  8:44 PM   Specimen: Nasopharyngeal Swab  Result Value Ref Range Status   SARS Coronavirus 2 NEGATIVE NEGATIVE Final    Comment: (NOTE) If result is NEGATIVE SARS-CoV-2 target nucleic acids are NOT DETECTED. The SARS-CoV-2 RNA is generally detectable in upper and lower  respiratory specimens during the acute phase of infection. The lowest  concentration of SARS-CoV-2  viral copies this assay can detect is 250  copies / mL. A negative result does not preclude SARS-CoV-2 infection  and should not be used as the sole basis for treatment or other  patient management decisions.  A negative result may occur with  improper specimen collection / handling, submission of specimen other  than nasopharyngeal swab, presence of viral mutation(s) within the  areas targeted by this assay, and inadequate number of viral copies  (<250 copies / mL). A negative result must be combined with clinical  observations, patient history, and epidemiological information. If result is POSITIVE SARS-CoV-2 target nucleic acids are DETECTED. The SARS-CoV-2 RNA is generally detectable in upper and lower  respiratory specimens dur ing the acute phase of infection.  Positive  results are indicative of active  infection with SARS-CoV-2.  Clinical  correlation with patient history and other diagnostic information is  necessary to determine patient infection status.  Positive results do  not rule out bacterial infection or co-infection with other viruses. If result is PRESUMPTIVE POSTIVE SARS-CoV-2 nucleic acids MAY BE PRESENT.   A presumptive positive result was obtained on the submitted specimen  and confirmed on repeat testing.  While 2019 novel coronavirus  (SARS-CoV-2) nucleic acids may be present in the submitted sample  additional confirmatory testing may be necessary for epidemiological  and / or clinical management purposes  to differentiate between  SARS-CoV-2 and other Sarbecovirus currently known to infect humans.  If clinically indicated additional testing with an alternate test  methodology [phone]) is advised. The SARS-CoV-2 RNA is generally  detectable in upper and lower respiratory sp ecimens during the acute  phase of infection. The expected result is Negative. Fact Sheet for Patients:  StrictlyIdeas.no Fact Sheet for Healthcare Providers: BankingDealers.co.za This test is not yet approved or cleared by the [place] FDA and has been authorized for detection and/or diagnosis of SARS-CoV-2 by FDA under an Emergency Use Authorization (EUA).  This EUA will remain in effect (meaning this test can be used) for the duration of the COVID-19 declaration under Section 564(b)(1) of the Act, 21 U.S.C. section 360bbb-3(b)(1), unless the authorization is terminated or revoked sooner. Performed at [facility], [address], [place], [place] [address]   Culture, blood (routine x 2)     Status: None (Preliminary result)   Collection Time: 02/22/19  9:17 PM   Specimen: BLOOD  Result Value Ref Range Status   Specimen Description BLOOD LEFT ANTECUBITAL  Final   Special Requests   Final    BOTTLES DRAWN AEROBIC AND ANAEROBIC Blood Culture  adequate volume   Culture   Final    NO GROWTH < 12 HOURS Performed at [facility], [address], [place], [place] [address]    Report Status PENDING  Incomplete  Culture, blood (routine x 2)     Status: None (Preliminary result)   Collection Time: 02/22/19  9:17 PM   Specimen: BLOOD  Result Value Ref Range Status   Specimen Description BLOOD RIGHT ANTECUBITAL  Final   Special Requests   Final    BOTTLES DRAWN AEROBIC AND ANAEROBIC Blood Culture adequate volume   Culture  Setup Time   Final    GRAM POSITIVE COCCI AEROBIC BOTTLE ONLY Gram Stain Report Called to,Read Back By and Verified With: FOLEY B. AT [id number] ON [id number] BY THOMPSON S. Performed at [facility], [address] [place],  [address]    Culture PENDING  Incomplete   Report Status PENDING  Incomplete  Radiology Studies: No results found.   Scheduled Meds: . apixaban  2.5 mg Oral BID  . atorvastatin  80 mg Oral q1800  . carvedilol  3.125 mg Oral BID WC  . gabapentin  300 mg Oral TID  . insulin aspart  0-9 Units Subcutaneous TID WC  . insulin glargine  10 Units Subcutaneous QHS  . levothyroxine  137 mcg Oral QAC breakfast  . mirtazapine  15 mg Oral QHS  . pantoprazole  40 mg Oral Daily  . sacubitril-valsartan  1 tablet Oral BID   Continuous Infusions: . sodium chloride 10 mL/hr at 02/23/19 0152  . cefTRIAXone (ROCEPHIN)  IV    . vancomycin       LOS: 0 days    Time spent: 30 minutes   Barton Dubois, MD [facility] Pager [phone]  02/23/2019, 1:47 PM

## 2019-02-23 NOTE — ED Notes (Signed)
Spoke to Dr Dyann Kief regarding glucose status on 3rd shift.  Informed or treatment given and current glucose of 123.

## 2019-02-23 NOTE — ED Notes (Signed)
Pt given peanut butter, crackers and ginger ale 

## 2019-02-24 DIAGNOSIS — L03114 Cellulitis of left upper limb: Secondary | ICD-10-CM | POA: Diagnosis not present

## 2019-02-24 DIAGNOSIS — I5043 Acute on chronic combined systolic (congestive) and diastolic (congestive) heart failure: Secondary | ICD-10-CM | POA: Diagnosis not present

## 2019-02-24 DIAGNOSIS — E039 Hypothyroidism, unspecified: Secondary | ICD-10-CM | POA: Diagnosis not present

## 2019-02-24 DIAGNOSIS — E1121 Type 2 diabetes mellitus with diabetic nephropathy: Secondary | ICD-10-CM | POA: Diagnosis not present

## 2019-02-24 LAB — GLUCOSE, CAPILLARY
Glucose-Capillary: 100 mg/dL — ABNORMAL HIGH (ref 70–99)
Glucose-Capillary: 159 mg/dL — ABNORMAL HIGH (ref 70–99)

## 2019-02-24 MED ORDER — INSULIN GLARGINE 100 UNITS/ML SOLOSTAR PEN: 10.0000 [IU] | PEN_INJECTOR | Freq: Every day | SUBCUTANEOUS | Status: DC

## 2019-02-24 MED ORDER — CEFDINIR 300 MG PO CAPS: 300.0000 mg | ORAL_CAPSULE | Freq: Two times a day (BID) | ORAL | 0 refills | Status: AC

## 2019-02-24 MED ORDER — SODIUM CHLORIDE 0.9 % IV SOLN
2.0000 g | INTRAVENOUS | Status: DC
  Administered 2019-02-24: 2 g via INTRAVENOUS
  Filled 2019-02-24: qty 20

## 2019-02-24 NOTE — Plan of Care (Signed)

## 2019-02-24 NOTE — Progress Notes (Signed)
Nsg Discharge Note  Admit Date:  02/22/2019 Discharge date: 02/24/2019   Debra [name] to be D/C'dhome  per MD order.  AVS completed.  Copy for chart, and copy for patient signed, and dated. Patient/caregiver able to verbalize understanding.  Discharge Medication: Allergies as of 02/24/2019      Reactions   Ciprofloxacin Rash, Itching   Iodine    Iodinated Diagnostic Agents Rash      Medication List    TAKE these medications   Accu-Chek Aviva Plus test strip Generic drug: glucose blood   Accu-Chek Aviva Plus w/Device Kit   acetaminophen 325 MG tablet Commonly known as: TYLENOL Take 650 mg by mouth every 6 (six) hours as needed for mild pain.   apixaban 2.5 MG Tabs tablet Commonly known as: ELIQUIS Take 1 tablet (2.5 mg total) by mouth 2 (two) times daily.   atorvastatin 80 MG tablet Commonly known as: LIPITOR Take 1 tablet (80 mg total) by mouth daily at 6 PM.   carvedilol 3.125 MG tablet Commonly known as: COREG Take 3.125 mg by mouth 2 (two) times daily with a meal.   cefdinir 300 MG capsule Commonly known as: OMNICEF Take 1 capsule (300 mg total) by mouth 2 (two) times daily for 8 days.   Entresto 24-26 MG Generic drug: sacubitril-valsartan Take 1 tablet by mouth 2 (two) times daily.   gabapentin 100 MG capsule Commonly known as: NEURONTIN Take 300 mg by mouth 3 (three) times daily.   HYDROcodone-acetaminophen 10-325 MG tablet Commonly known as: NORCO Take 1 tablet by mouth every 6 (six) hours as needed for moderate pain or severe pain.   insulin glargine 100 unit/mL Sopn Commonly known as: LANTUS Inject 0.1 mLs (10 Units total) into the skin at bedtime. What changed: how much to take   mirtazapine 15 MG tablet Commonly known as: REMERON Take 15 mg by mouth at bedtime.   pantoprazole 40 MG tablet Commonly known as: PROTONIX Take 40 mg by mouth daily.   rOPINIRole 2 MG tablet Commonly known as: REQUIP   Unithroid 137 MCG tablet Generic drug:  levothyroxine Take by mouth daily before breakfast.   Ventolin HFA 108 (90 Base) MCG/ACT inhaler Generic drug: albuterol Inhale 1 puff into the lungs every 6 (six) hours as needed for shortness of breath or wheezing.       Discharge Assessment: Vitals:   02/23/19 2114 02/24/19 0506  BP: (!) 92/53 (!) 138/44  Pulse: 72 (!) 46  Resp:  16  Temp:  (!) 97.4 F (36.3 C)  SpO2: 96% 100%   Skin clean, dry and intact without evidence of skin break down, no evidence of skin tears noted. IV catheter discontinued intact. Site without signs and symptoms of complications - no redness or edema noted at insertion site, patient denies c/o pain - only slight tenderness at site.  Dressing with slight pressure applied.  D/c Instructions-Education: Discharge instructions given to patient/family with verbalized understanding. D/c education completed with patient/family including follow up instructions, medication list, d/c activities limitations if indicated, with other d/c instructions as indicated by MD - patient able to verbalize understanding, all questions fully answered. Patient instructed to return to ED, call 911, or call MD for any changes in condition.  Patient escorted via [place], and D/C home via private auto.  Venita Sheffield, RN 02/24/2019 1:31 PM

## 2019-02-24 NOTE — Progress Notes (Signed)
PHARMACY - PHYSICIAN COMMUNICATION CRITICAL VALUE ALERT - BLOOD CULTURE IDENTIFICATION (BCID)  Debra [name] is an 85 y.o. female who presented to [facility] on 02/22/2019 with a chief complaint of redness and edema to left arm  Assessment:  Patient presented with redness and edema to her left arm. WBC is normal and afebrile. BCX is now positive for Serratia.   Name of physician (or Provider) Contacted: Dr. Dyann Kief  Current antibiotics: ceftriaxone 1gm IV q24h for cellulitis  Changes to prescribed antibiotics recommended:  Recommendations accepted by provider  Increased ceftriaxone 2gm IV q24h for bacteremia/cellulitis  Results for orders placed or performed during the hospital encounter of 02/22/19  Blood Culture ID Panel (Reflexed) (Collected: 02/22/2019  9:17 PM)  Result Value Ref Range   Enterococcus species NOT DETECTED NOT DETECTED   Listeria monocytogenes NOT DETECTED NOT DETECTED   Staphylococcus species NOT DETECTED NOT DETECTED   Staphylococcus aureus (BCID) NOT DETECTED NOT DETECTED   Streptococcus species NOT DETECTED NOT DETECTED   Streptococcus agalactiae NOT DETECTED NOT DETECTED   Streptococcus pneumoniae NOT DETECTED NOT DETECTED   Streptococcus pyogenes NOT DETECTED NOT DETECTED   Acinetobacter baumannii NOT DETECTED NOT DETECTED   Enterobacteriaceae species DETECTED (A) NOT DETECTED   Enterobacter cloacae complex NOT DETECTED NOT DETECTED   Escherichia coli NOT DETECTED NOT DETECTED   Klebsiella oxytoca NOT DETECTED NOT DETECTED   Klebsiella pneumoniae NOT DETECTED NOT DETECTED   Proteus species NOT DETECTED NOT DETECTED   Serratia marcescens DETECTED (A) NOT DETECTED   Carbapenem resistance NOT DETECTED NOT DETECTED   Haemophilus influenzae NOT DETECTED NOT DETECTED   Neisseria meningitidis NOT DETECTED NOT DETECTED   Pseudomonas aeruginosa NOT DETECTED NOT DETECTED   Candida albicans NOT DETECTED NOT DETECTED   Candida glabrata NOT DETECTED NOT DETECTED   Candida krusei NOT DETECTED NOT DETECTED   Candida parapsilosis NOT DETECTED NOT DETECTED   Candida tropicalis NOT DETECTED NOT DETECTED    Isac Sarna, BS [place], BCPS Clinical Pharmacist Pager [phone] 02/24/2019  12:05 PM

## 2019-02-24 NOTE — Discharge Summary (Signed)
Physician Discharge Summary  Debra [name] INO:[id number] DOB: 1929/04/04 DOA: 02/22/2019  PCP: Sharilyn Sites, MD  Admit date: 02/22/2019 Discharge date: 02/24/2019  Time spent: 35 minutes  Recommendations for Outpatient Follow-up:  1. Repeat basic metabolic panel to follow electrolytes and renal function 2. Reassess left upper extremity cellulitic process to assure complete resolution. 3. Close follow-up on patient's CBGs with further adjustment to hypoglycemic regimen as needed.   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  Patient discharged home with instruction to follow-up with PCP in 10 days.  Diet recommendation: Heart healthy modified carbohydrate.  Filed Weights   02/23/19 0934 02/23/19 1011 02/24/19 0500  Weight: 61.2 kg 61.5 kg 61 kg    History of present illness:  As per H&P written by Dr. Darrick Meigs on 02/22/2019 85 y.o.female,with history of CAD status post CABG, chronic systolic CHF, CKD stage IV, diabetes mellitus type 2, hypertension, gout came to ED with complaints of redness of left arm which is been spreading over the past 24 hours. Patient says that bedroom door hit her left arm and after that she started having redness and warmth of left arm. Surprisingly she denies any pain. Denies fever or chills. Denies nausea vomiting or diarrhea. Denies chest pain or shortness of breath. Denies abdominal pain or dysuria.  In the ED, WBC is 9.6, lactic acid 1.0  Hospital Course:  1-left lower extremity cellulitis/Serratia bacteremia -No open wounds or drainage appreciated -Significant improvement in her swelling, erythema and warmth sensation at time of discharge -1 out of 4 blood cultures positive for Serratia; patient with excellent  response and no signs of systemic infection after IV antibiotics provided. -She received 2 days of IV antibiotics using Rocephin and will be discharged with instructions to complete another 8 days of cefdinir 300 mg twice a day. -Advised to maintain adequate hydration, to keep her left upper extremity elevated as much as possible and to use cold compresses and PRN analgesics. -Outpatient follow-up with PCP in 10 days.  2-paroxysmal atrial fibrillation -Rate controlled -Continue the use of carvedilol and Eliquis for secondary prevention -Continue outpatient follow-up with cardiology service.  3-chronic systolic and diastolic heart failure -Stable and compensated -Most recent ejection fraction 40 to 45% -Continue Entresto and carvedilol -Follow daily weights and low-sodium diet.  4-hypothyroidism -Continue Synthroid  5-type 2 diabetes with nephropathy:  -Patient experience an episode of hypoglycemia during this hospitalization, associated with poor oral intake.   -Continue adjusted dose of Lantus at discharge -Patient educated about avoiding skipping meals. -Reassess CBGs and A1c as an outpatient with further adjustment to hypoglycemic regimen as needed.  6-GERD -Continue Protonix  7-diabetic neuropathy -Continue Neurontin  8-chronic kidney disease is stage III -Stable and compensated -Creatinine remain at baseline -Will recommend repeat basic metabolic panel at follow-up visit to reassess renal function and stability.   Procedures:  See below for x-ray reports  Consultations:  None  Discharge Exam: Vitals:   02/23/19 2114 02/24/19 0506  BP: (!) 92/53 (!) 138/44  Pulse: 72 (!) 46  Resp:  16  Temp:  (!) 97.4 F (36.3 C)  SpO2: 96% 100%    General: Alert, awake and oriented x3; denies chest pain, no shortness of breath, no nausea, no vomiting.  Significant improvement in her left upper extremity swelling and erythema; no warmth sensation on  palpation at time  of discharge. Cardiovascular: Clear to auscultation bilaterally; normal respiratory effort. Respiratory: Rate control, no rubs, no gallops, no JVD. Abdomen: Soft, nontender, nondistended, positive bowel sounds. Extremities/skin: Very mild erythematous changes appreciated on her left upper extremity, no swelling, no pain, no warmth sensation on exam.  Some chronic bruises and old abrasions appreciated on her extremities, no signs of superimposed infection, no abscesses, no drainage.  Lower extremities without edema, no cyanosis, no clubbing.  Discharge Instructions   Discharge Instructions    (HEART FAILURE PATIENTS) Call MD:  Anytime you have any of the following symptoms: 1) 3 pound weight gain in 24 hours or 5 pounds in 1 week 2) shortness of breath, with or without a dry hacking cough 3) swelling in the hands, feet or stomach 4) if you have to sleep on extra pillows at night in order to breathe.   Complete by: As directed    Diet - low sodium heart healthy   Complete by: As directed    Discharge instructions   Complete by: As directed    Take medications as prescribed Maintain adequate hydration Follow heart healthy diet Check your weight on daily basis Do not skip any meals Follow-up with PCP in 10 days.     Allergies as of 02/24/2019      Reactions   Ciprofloxacin Rash, Itching   Iodine    Iodinated Diagnostic Agents Rash      Medication List    TAKE these medications   Accu-Chek Aviva Plus test strip Generic drug: glucose blood   Accu-Chek Aviva Plus w/Device Kit   acetaminophen 325 MG tablet Commonly known as: TYLENOL Take 650 mg by mouth every 6 (six) hours as needed for mild pain.   apixaban 2.5 MG Tabs tablet Commonly known as: ELIQUIS Take 1 tablet (2.5 mg total) by mouth 2 (two) times daily.   atorvastatin 80 MG tablet Commonly known as: LIPITOR Take 1 tablet (80 mg total) by mouth daily at 6 PM.   carvedilol 3.125 MG tablet Commonly known as:  COREG Take 3.125 mg by mouth 2 (two) times daily with a meal.   cefdinir 300 MG capsule Commonly known as: OMNICEF Take 1 capsule (300 mg total) by mouth 2 (two) times daily for 8 days.   Entresto 24-26 MG Generic drug: sacubitril-valsartan Take 1 tablet by mouth 2 (two) times daily.   gabapentin 100 MG capsule Commonly known as: NEURONTIN Take 300 mg by mouth 3 (three) times daily.   HYDROcodone-acetaminophen 10-325 MG tablet Commonly known as: NORCO Take 1 tablet by mouth every 6 (six) hours as needed for moderate pain or severe pain.   insulin glargine 100 unit/mL Sopn Commonly known as: LANTUS Inject 0.1 mLs (10 Units total) into the skin at bedtime. What changed: how much to take   mirtazapine 15 MG tablet Commonly known as: REMERON Take 15 mg by mouth at bedtime.   pantoprazole 40 MG tablet Commonly known as: PROTONIX Take 40 mg by mouth daily.   rOPINIRole 2 MG tablet Commonly known as: REQUIP   Unithroid 137 MCG tablet Generic drug: levothyroxine Take by mouth daily before breakfast.   Ventolin HFA 108 (90 Base) MCG/ACT inhaler Generic drug: albuterol Inhale 1 puff into the lungs every 6 (six) hours as needed for shortness of breath or wheezing.      Allergies  Allergen Reactions  . Ciprofloxacin Rash and Itching  . Iodine   . Iodinated Diagnostic Agents Rash   Follow-up Information  Sharilyn Sites, MD. Schedule an appointment as soon as possible for a visit in 10 day(s).   Specialty: Family Medicine Contact information: [address] [place] [address] [phone]        [name], MD .   Specialty: Cardiology Contact information: [place] [address] [phone]           The results of significant diagnostics from this hospitalization (including imaging, microbiology, ancillary and laboratory) are listed below for reference.    Significant Diagnostic Studies: No results  found.  Microbiology: Recent Results (from the past 240 hour(s))  SARS Coronavirus 2 [facility] order, Performed in [facility] hospital lab) Nasopharyngeal Nasopharyngeal Swab     Status: None   Collection Time: 02/22/19  8:44 PM   Specimen: Nasopharyngeal Swab  Result Value Ref Range Status   SARS Coronavirus 2 NEGATIVE NEGATIVE Final    Comment: (NOTE) If result is NEGATIVE SARS-CoV-2 target nucleic acids are NOT DETECTED. The SARS-CoV-2 RNA is generally detectable in upper and lower  respiratory specimens during the acute phase of infection. The lowest  concentration of SARS-CoV-2 viral copies this assay can detect is 250  copies / mL. A negative result does not preclude SARS-CoV-2 infection  and should not be used as the sole basis for treatment or other  patient management decisions.  A negative result may occur with  improper specimen collection / handling, submission of specimen other  than nasopharyngeal swab, presence of viral mutation(s) within the  areas targeted by this assay, and inadequate number of viral copies  (<250 copies / mL). A negative result must be combined with clinical  observations, patient history, and epidemiological information. If result is POSITIVE SARS-CoV-2 target nucleic acids are DETECTED. The SARS-CoV-2 RNA is generally detectable in upper and lower  respiratory specimens dur ing the acute phase of infection.  Positive  results are indicative of active infection with SARS-CoV-2.  Clinical  correlation with patient history and other diagnostic information is  necessary to determine patient infection status.  Positive results do  not rule out bacterial infection or co-infection with other viruses. If result is PRESUMPTIVE POSTIVE SARS-CoV-2 nucleic acids MAY BE PRESENT.   A presumptive positive result was obtained on the submitted specimen  and confirmed on repeat testing.  While 2019 novel coronavirus  (SARS-CoV-2) nucleic acids may be present  in the submitted sample  additional confirmatory testing may be necessary for epidemiological  and / or clinical management purposes  to differentiate between  SARS-CoV-2 and other Sarbecovirus currently known to infect humans.  If clinically indicated additional testing with an alternate test  methodology [phone]) is advised. The SARS-CoV-2 RNA is generally  detectable in upper and lower respiratory sp ecimens during the acute  phase of infection. The expected result is Negative. Fact Sheet for Patients:  StrictlyIdeas.no Fact Sheet for Healthcare Providers: BankingDealers.co.za This test is not yet approved or cleared by the [place] FDA and has been authorized for detection and/or diagnosis of SARS-CoV-2 by FDA under an Emergency Use Authorization (EUA).  This EUA will remain in effect (meaning this test can be used) for the duration of the COVID-19 declaration under Section 564(b)(1) of the Act, 21 U.S.C. section 360bbb-3(b)(1), unless the authorization is terminated or revoked sooner. Performed at [facility], [address], [place], [place] [address]   Culture, blood (routine x 2)     Status: None (Preliminary result)   Collection Time: 02/22/19  9:17 PM   Specimen:  BLOOD  Result Value Ref Range Status   Specimen Description BLOOD LEFT ANTECUBITAL  Final   Special Requests   Final    BOTTLES DRAWN AEROBIC AND ANAEROBIC Blood Culture adequate volume   Culture   Final    NO GROWTH < 12 HOURS Performed at [facility], [address] [place], [place] [address]    Report Status PENDING  Incomplete  Culture, blood (routine x 2)     Status: None (Preliminary result)   Collection Time: 02/22/19  9:17 PM   Specimen: BLOOD  Result Value Ref Range Status   Specimen Description   Final    BLOOD RIGHT ANTECUBITAL Performed at [facility], [address], [place], [place] [address]    Special Requests   Final    BOTTLES DRAWN  AEROBIC AND ANAEROBIC Blood Culture adequate volume Performed at [facility], [address], [place], [place] [address]    Culture  Setup Time   Final    GRAM NEGATIVE RODS AEROBIC BOTTLE ONLY Gram Stain Report Called to,Read Back By and Verified With: FOLEY B. AT [id number] ON [id number] BY THOMPSON S. Organism ID to follow CRITICAL RESULT CALLED TO, READ BACK BY AND VERIFIED WITH: J RHEW RN 02/23/19 2110 JDW Performed at [facility], [place] [address], [place], [place] [address]    Culture GRAM NEGATIVE RODS  Final   Report Status PENDING  Incomplete  Blood Culture ID Panel (Reflexed)     Status: Abnormal   Collection Time: 02/22/19  9:17 PM  Result Value Ref Range Status   Enterococcus species NOT DETECTED NOT DETECTED Final   Listeria monocytogenes NOT DETECTED NOT DETECTED Final   Staphylococcus species NOT DETECTED NOT DETECTED Final   Staphylococcus aureus (BCID) NOT DETECTED NOT DETECTED Final   Streptococcus species NOT DETECTED NOT DETECTED Final   Streptococcus agalactiae NOT DETECTED NOT DETECTED Final   Streptococcus pneumoniae NOT DETECTED NOT DETECTED Final   Streptococcus pyogenes NOT DETECTED NOT DETECTED Final   Acinetobacter baumannii NOT DETECTED NOT DETECTED Final   Enterobacteriaceae species DETECTED (A) NOT DETECTED Final    Comment: Enterobacteriaceae represent a large family of gram-negative bacteria, not a single organism. CRITICAL RESULT CALLED TO, READ BACK BY AND VERIFIED WITH: J RHEW RN 02/23/19 2110 JDW    Enterobacter cloacae complex NOT DETECTED NOT DETECTED Final   Escherichia coli NOT DETECTED NOT DETECTED Final   Klebsiella oxytoca NOT DETECTED NOT DETECTED Final   Klebsiella pneumoniae NOT DETECTED NOT DETECTED Final   Proteus species NOT DETECTED NOT DETECTED Final   Serratia marcescens DETECTED (A) NOT DETECTED Final    Comment: CRITICAL RESULT CALLED TO, READ BACK BY AND VERIFIED WITH: J RHEW RN 02/23/19 2110 JDW    Carbapenem resistance NOT  DETECTED NOT DETECTED Final   Haemophilus influenzae NOT DETECTED NOT DETECTED Final   Neisseria meningitidis NOT DETECTED NOT DETECTED Final   Pseudomonas aeruginosa NOT DETECTED NOT DETECTED Final   Candida albicans NOT DETECTED NOT DETECTED Final   Candida glabrata NOT DETECTED NOT DETECTED Final   Candida krusei NOT DETECTED NOT DETECTED Final   Candida parapsilosis NOT DETECTED NOT DETECTED Final   Candida tropicalis NOT DETECTED NOT DETECTED Final    Comment: Performed at [facility], [place] [address], [place], [place] [address]     Labs: Basic Metabolic Panel: Recent Labs  Lab 02/22/19 2102 02/23/19 0518  NA 139 142  K 3.9 3.5  CL 107 110  CO2 23 25  GLUCOSE 91 67*  BUN 24* 22  CREATININE 1.22* 1.22*  CALCIUM 9.0 8.7*   Liver Function Tests: Recent Labs  Lab 02/22/19 2102 02/23/19 0518  AST 19 14*  ALT 15 13  ALKPHOS 85 64  BILITOT 1.6* 0.9  PROT 6.0* 5.0*  ALBUMIN 3.3* 2.6*   CBC: Recent Labs  Lab 02/22/19 2102 02/23/19 0518  WBC 9.6 6.7  NEUTROABS 7.2  --   HGB 12.4 10.9*  HCT 38.9 34.2*  MCV 99.7 99.4  PLT 190 163    CBG: Recent Labs  Lab 02/23/19 1116 02/23/19 1713 02/23/19 2227 02/24/19 0819 02/24/19 1126  GLUCAP 177* 102* 155* 100* 159*    Signed:  Barton Dubois MD.  [facility] 02/24/2019, 12:26 PM

## 2019-02-25 LAB — CULTURE, BLOOD (ROUTINE X 2): Special Requests: ADEQUATE

## 2019-02-27 LAB — CULTURE, BLOOD (ROUTINE X 2)
Culture: NO GROWTH
Special Requests: ADEQUATE

## 2019-02-28 DIAGNOSIS — Z794 Long term (current) use of insulin: Secondary | ICD-10-CM | POA: Diagnosis not present

## 2019-02-28 DIAGNOSIS — G894 Chronic pain syndrome: Secondary | ICD-10-CM | POA: Diagnosis not present

## 2019-02-28 DIAGNOSIS — Z6821 Body mass index (BMI) 21.0-21.9, adult: Secondary | ICD-10-CM | POA: Diagnosis not present

## 2019-02-28 DIAGNOSIS — L03114 Cellulitis of left upper limb: Secondary | ICD-10-CM | POA: Diagnosis not present

## 2019-02-28 DIAGNOSIS — M1991 Primary osteoarthritis, unspecified site: Secondary | ICD-10-CM | POA: Diagnosis not present

## 2019-03-01 ENCOUNTER — Other Ambulatory Visit: Payer: Self-pay

## 2019-03-01 ENCOUNTER — Ambulatory Visit (INDEPENDENT_AMBULATORY_CARE_PROVIDER_SITE_OTHER): Payer: Medicare HMO | Admitting: Orthotics

## 2019-03-01 DIAGNOSIS — M2042 Other hammer toe(s) (acquired), left foot: Secondary | ICD-10-CM | POA: Diagnosis not present

## 2019-03-01 DIAGNOSIS — M2041 Other hammer toe(s) (acquired), right foot: Secondary | ICD-10-CM

## 2019-03-01 DIAGNOSIS — E1142 Type 2 diabetes mellitus with diabetic polyneuropathy: Secondary | ICD-10-CM | POA: Diagnosis not present

## 2019-03-01 DIAGNOSIS — E119 Type 2 diabetes mellitus without complications: Secondary | ICD-10-CM

## 2019-03-01 DIAGNOSIS — M2032 Hallux varus (acquired), left foot: Secondary | ICD-10-CM

## 2019-03-01 NOTE — Progress Notes (Signed)

## 2019-03-09 DIAGNOSIS — M5431 Sciatica, right side: Secondary | ICD-10-CM | POA: Diagnosis not present

## 2019-03-21 ENCOUNTER — Emergency Department (HOSPITAL_COMMUNITY)
Admission: EM | Admit: 2019-03-21 | Discharge: 2019-03-21 | Disposition: A | Discharge status: Home / Self Care | Payer: Medicare HMO | Attending: Emergency Medicine | Admitting: Emergency Medicine

## 2019-03-21 ENCOUNTER — Other Ambulatory Visit: Payer: Self-pay

## 2019-03-21 ENCOUNTER — Emergency Department (HOSPITAL_COMMUNITY): Payer: Medicare HMO

## 2019-03-21 ENCOUNTER — Encounter (HOSPITAL_COMMUNITY): Payer: Self-pay | Admitting: Emergency Medicine

## 2019-03-21 DIAGNOSIS — S0083XA Contusion of other part of head, initial encounter: Secondary | ICD-10-CM | POA: Diagnosis not present

## 2019-03-21 DIAGNOSIS — Z79899 Other long term (current) drug therapy: Secondary | ICD-10-CM | POA: Diagnosis not present

## 2019-03-21 DIAGNOSIS — S0990XA Unspecified injury of head, initial encounter: Secondary | ICD-10-CM | POA: Diagnosis not present

## 2019-03-21 DIAGNOSIS — Y999 Unspecified external cause status: Secondary | ICD-10-CM | POA: Diagnosis not present

## 2019-03-21 DIAGNOSIS — I509 Heart failure, unspecified: Secondary | ICD-10-CM | POA: Insufficient documentation

## 2019-03-21 DIAGNOSIS — R52 Pain, unspecified: Secondary | ICD-10-CM | POA: Diagnosis not present

## 2019-03-21 DIAGNOSIS — E1122 Type 2 diabetes mellitus with diabetic chronic kidney disease: Secondary | ICD-10-CM | POA: Diagnosis not present

## 2019-03-21 DIAGNOSIS — R4020 Unspecified coma: Secondary | ICD-10-CM | POA: Diagnosis not present

## 2019-03-21 DIAGNOSIS — Z87891 Personal history of nicotine dependence: Secondary | ICD-10-CM | POA: Insufficient documentation

## 2019-03-21 DIAGNOSIS — I130 Hypertensive heart and chronic kidney disease with heart failure and stage 1 through stage 4 chronic kidney disease, or unspecified chronic kidney disease: Secondary | ICD-10-CM | POA: Insufficient documentation

## 2019-03-21 DIAGNOSIS — N184 Chronic kidney disease, stage 4 (severe): Secondary | ICD-10-CM | POA: Insufficient documentation

## 2019-03-21 DIAGNOSIS — R55 Syncope and collapse: Secondary | ICD-10-CM | POA: Diagnosis not present

## 2019-03-21 DIAGNOSIS — Y9271 Barn as the place of occurrence of the external cause: Secondary | ICD-10-CM | POA: Diagnosis not present

## 2019-03-21 DIAGNOSIS — S61412A Laceration without foreign body of left hand, initial encounter: Secondary | ICD-10-CM | POA: Diagnosis not present

## 2019-03-21 DIAGNOSIS — Z23 Encounter for immunization: Secondary | ICD-10-CM | POA: Insufficient documentation

## 2019-03-21 DIAGNOSIS — Z794 Long term (current) use of insulin: Secondary | ICD-10-CM | POA: Insufficient documentation

## 2019-03-21 DIAGNOSIS — S51811A Laceration without foreign body of right forearm, initial encounter: Secondary | ICD-10-CM | POA: Diagnosis not present

## 2019-03-21 DIAGNOSIS — W19XXXA Unspecified fall, initial encounter: Secondary | ICD-10-CM | POA: Diagnosis not present

## 2019-03-21 DIAGNOSIS — Z7901 Long term (current) use of anticoagulants: Secondary | ICD-10-CM | POA: Diagnosis not present

## 2019-03-21 DIAGNOSIS — R531 Weakness: Secondary | ICD-10-CM | POA: Diagnosis not present

## 2019-03-21 DIAGNOSIS — T07XXXA Unspecified multiple injuries, initial encounter: Secondary | ICD-10-CM | POA: Diagnosis not present

## 2019-03-21 DIAGNOSIS — Y93K9 Activity, other involving animal care: Secondary | ICD-10-CM | POA: Insufficient documentation

## 2019-03-21 DIAGNOSIS — R58 Hemorrhage, not elsewhere classified: Secondary | ICD-10-CM | POA: Diagnosis not present

## 2019-03-21 LAB — COMPREHENSIVE METABOLIC PANEL
ALT: 13 U/L (ref 0–44)
AST: 15 U/L (ref 15–41)
Albumin: 3.3 g/dL — ABNORMAL LOW (ref 3.5–5.0)
Alkaline Phosphatase: 89 U/L (ref 38–126)
Anion gap: 8 (ref 5–15)
BUN: 20 mg/dL (ref 8–23)
CO2: 24 mmol/L (ref 22–32)
Calcium: 9 mg/dL (ref 8.9–10.3)
Chloride: 107 mmol/L (ref 98–111)
Creatinine, Ser: 1.22 mg/dL — ABNORMAL HIGH (ref 0.44–1.00)
GFR calc Af Amer: 46 mL/min — ABNORMAL LOW (ref >60)
GFR calc non Af Amer: 40 mL/min — ABNORMAL LOW (ref >60)
Glucose, Bld: 108 mg/dL — ABNORMAL HIGH (ref 70–99)
Potassium: 3.7 mmol/L (ref 3.5–5.1)
Sodium: 139 mmol/L (ref 135–145)
Total Bilirubin: 0.9 mg/dL (ref 0.3–1.2)
Total Protein: 5.8 g/dL — ABNORMAL LOW (ref 6.5–8.1)

## 2019-03-21 LAB — CBC WITH DIFFERENTIAL/PLATELET
Abs Immature Granulocytes: 0.04 10*3/uL (ref 0.00–0.07)
Basophils Absolute: 0 10*3/uL (ref 0.0–0.1)
Basophils Relative: 0 %
Eosinophils Absolute: 0.2 10*3/uL (ref 0.0–0.5)
Eosinophils Relative: 2 %
HCT: 36.9 % (ref 36.0–46.0)
Hemoglobin: 11.4 g/dL — ABNORMAL LOW (ref 12.0–15.0)
Immature Granulocytes: 1 %
Lymphocytes Relative: 17 %
Lymphs Abs: 1.3 10*3/uL (ref 0.7–4.0)
MCH: 31.8 pg (ref 26.0–34.0)
MCHC: 30.9 g/dL (ref 30.0–36.0)
MCV: 103.1 fL — ABNORMAL HIGH (ref 80.0–100.0)
Monocytes Absolute: 0.6 10*3/uL (ref 0.1–1.0)
Monocytes Relative: 8 %
Neutro Abs: 5.6 10*3/uL (ref 1.7–7.7)
Neutrophils Relative %: 72 %
Platelets: 255 10*3/uL (ref 150–400)
RBC: 3.58 MIL/uL — ABNORMAL LOW (ref 3.87–5.11)
RDW: 14.8 % (ref 11.5–15.5)
WBC: 7.8 10*3/uL (ref 4.0–10.5)
nRBC: 0 % (ref 0.0–0.2)

## 2019-03-21 LAB — TROPONIN I (HIGH SENSITIVITY): Troponin I (High Sensitivity): 15 ng/L (ref <18)

## 2019-03-21 MED ORDER — HYDROCODONE-ACETAMINOPHEN 5-325 MG PO TABS
1.0000 | ORAL_TABLET | Freq: Once | ORAL | Status: AC
  Administered 2019-03-21: 1 via ORAL
  Filled 2019-03-21: qty 1

## 2019-03-21 MED ORDER — TETANUS-DIPHTH-ACELL PERTUSSIS 5-2.5-18.5 LF-MCG/0.5 IM SUSP
0.5000 mL | Freq: Once | INTRAMUSCULAR | Status: AC
  Administered 2019-03-21: 0.5 mL via INTRAMUSCULAR
  Filled 2019-03-21: qty 0.5

## 2019-03-21 NOTE — Discharge Instructions (Addendum)
Follow up for recheck in 2 days

## 2019-03-21 NOTE — ED Triage Notes (Signed)
Patient brought in by EMS, states she was on a side-by-side leading a horse and the horse pulled back causing injury to patient. Skin tears noted to left index finger, left hand, right hand, right wrist, and right arm. Per EMS, patient's daughter stated that when patient stood up, she had syncopal episode. Patient alert and oriented at triage.

## 2019-03-22 NOTE — ED Provider Notes (Signed)
[facility] EMERGENCY DEPARTMENT Provider Note   CSN: [id number] Arrival date & time: 03/21/19  1955     History   Chief Complaint Chief Complaint  Patient presents with   Fall    HPI Debra [name] is a 85 y.o. female.     Patient was working with a horse and the rope hurt her hand and arm.  She also had a syncopal episode and hit her head patient feels slightly weak now  The history is provided by the patient.  Fall This is a new problem. The current episode started less than 1 hour ago. The problem occurs constantly. The problem has not changed since onset.Pertinent negatives include no chest pain, no abdominal pain and no headaches. Nothing aggravates the symptoms. Nothing relieves the symptoms. She has tried nothing for the symptoms. The treatment provided no relief.    Past Medical History:  Diagnosis Date   Arthritis    CHF (congestive heart failure) (HCC)    CKD (chronic kidney disease) stage 4, GFR 15-29 ml/min (HCC)    Colon cancer (HCC)    COPD (chronic obstructive pulmonary disease) (HCC)    Coronary artery disease    a. s/p CABG in 1997 (no detials provided) b. 12/2017: NSTEMI with cath showing patent LIMA-LAD with occluded SVG-OM and presumed occluded SVG-RCA but unable to be visualized, RCA was filled by collaterals. Medical management recommended.    DDD (degenerative disc disease), lumbar    Diabetic neuropathy ([place])    GERD (gastroesophageal reflux disease)    Gout    Hypertension    Restless leg syndrome    Type 2 diabetes mellitus ([place])     Patient Active Problem List   Diagnosis Date Noted   Cellulitis 02/22/2019   Adjustment disorder with mixed anxiety and depressed mood 12/21/2018   Acute on chronic systolic CHF (congestive heart failure) ([place]) 12/30/2017   Atrial fibrillation with RVR ([place]) 12/30/2017   NSTEMI (non-ST elevated myocardial infarction) ([place]) 12/26/2017   Palpitations 10/03/2017   Allergic rhinitis  09/20/2017   Asthma 09/20/2017   Carpal tunnel syndrome 09/20/2017   Diabetes with neurologic complications ([place]) 11/65/7903   Generalized osteoarthritis of hand 09/20/2017   GERD (gastroesophageal reflux disease) 09/20/2017   Hiatal hernia 09/20/2017   Positive ANA (antinuclear antibody) 09/20/2017   Restless leg 09/20/2017   Sciatica 09/20/2017   General weakness 05/07/2017   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   UTI (urinary tract infection) 03/20/2017   Hypertension 03/20/2017   AKI (acute kidney injury) ([place]) 03/20/2017   Chronic renal insufficiency, stage 3 (moderate) ([place]) 03/20/2017   Rheumatoid arthritis ([place]) 03/20/2017   Type 2 diabetes mellitus ([place]) 03/20/2017   COPD (chronic obstructive pulmonary disease) ([place]) 06/03/2016   Anxiety 05/09/2012   Colon cancer ([place]) 03/01/2011   CAD (coronary artery disease) 07/25/2010   Hyperlipidemia, unspecified 07/25/2010   Hypothyroidism 07/25/2010   PAD (peripheral artery disease) ([place]) 07/25/2010    Past Surgical History:  Procedure Laterality Date   ABDOMINAL HYSTERECTOMY     CHOLECYSTECTOMY     COLON RESECTION     CORONARY ARTERY BYPASS GRAFT     ILIAC ARTERY STENT     LEFT HEART CATH AND CORS/GRAFTS ANGIOGRAPHY N/A 12/28/2017   Procedure: LEFT HEART CATH AND CORS/GRAFTS ANGIOGRAPHY;  Surgeon: Martinique, Peter M, MD;  Location: [facility];  Service: Cardiovascular;  Laterality: N/A;   LUNG SURGERY Left 2012   left lung resection   RENAL ARTERY STENT     THYROID SURGERY  ULTRASOUND GUIDANCE FOR VASCULAR ACCESS  12/28/2017   Procedure: Ultrasound Guidance For Vascular Access;  Surgeon: Martinique, Peter M, MD;  Location: [facility];  Service: Cardiovascular;;     OB History    Gravida  4   Para  4   Term  4   Preterm      AB      Living        SAB      TAB      Ectopic      Multiple      Live Births               Home Medications    Prior to Admission  medications   Medication Sig Start Date End Date Taking? Authorizing Provider  ACCU-CHEK AVIVA PLUS test strip  07/11/18   [provider]  acetaminophen (TYLENOL) 325 MG tablet Take 650 mg by mouth every 6 (six) hours as needed for mild pain.    [provider]  albuterol (VENTOLIN HFA) 108 (90 Base) MCG/ACT inhaler Inhale 1 puff into the lungs every 6 (six) hours as needed for shortness of breath or wheezing.    [provider]  apixaban (ELIQUIS) 2.5 MG TABS tablet Take 1 tablet (2.5 mg total) by mouth 2 (two) times daily. 07/20/18   [name], MD  atorvastatin (LIPITOR) 80 MG tablet Take 1 tablet (80 mg total) by mouth daily at 6 PM. 12/31/17   Strader, [place] M, PA-C  Blood Glucose Monitoring Suppl (ACCU-CHEK AVIVA PLUS) w/Device KIT  07/11/18   [provider]  carvedilol (COREG) 3.125 MG tablet Take 3.125 mg by mouth 2 (two) times daily with a meal.    [provider]  gabapentin (NEURONTIN) 100 MG capsule Take 300 mg by mouth 3 (three) times daily.  03/09/18   [provider]  HYDROcodone-acetaminophen (NORCO) 10-325 MG tablet Take 1 tablet by mouth every 6 (six) hours as needed for moderate pain or severe pain.    [provider]  insulin glargine (LANTUS) 100 unit/mL SOPN Inject 0.1 mLs (10 Units total) into the skin at bedtime. 02/24/19   Barton Dubois, MD  levothyroxine Gwenevere Abbot) 137 MCG tablet Take by mouth daily before breakfast.  06/09/18   [provider]  mirtazapine (REMERON) 15 MG tablet Take 15 mg by mouth at bedtime.    [provider]  pantoprazole (PROTONIX) 40 MG tablet Take 40 mg by mouth daily.  10/11/18   [provider]  rOPINIRole (REQUIP) 2 MG tablet  09/03/18   [provider]  sacubitril-valsartan (ENTRESTO) 24-26 MG Take 1 tablet by mouth 2 (two) times daily.  02/13/18   [provider]    Family History Family History  Problem Relation Age of Onset    Colon cancer Mother    Heart disease Father    Heart attack Sister    Breast cancer Daughter     Social History Social History   Tobacco Use   Smoking status: Former Smoker    Quit date: 03/21/1996    Years since quitting: 23.0   Smokeless tobacco: Never Used  Substance Use Topics   Alcohol use: No   Drug use: No     Allergies   Ciprofloxacin, Iodine, and Iodinated diagnostic agents   Review of Systems Review of Systems  Constitutional: Positive for fatigue. Negative for appetite change.  HENT: Negative for congestion, ear discharge and sinus pressure.   Eyes: Negative for discharge.  Respiratory: Negative for cough.   Cardiovascular: Negative for chest pain.  Gastrointestinal: Negative for abdominal pain and diarrhea.  Genitourinary: Negative for frequency and hematuria.  Musculoskeletal: Negative for back pain.  Skin: Negative for rash.  Neurological: Negative for seizures and headaches.  Psychiatric/Behavioral: Negative for hallucinations.     Physical Exam Updated Vital Signs BP (!) 160/51    Pulse (!) 51    Resp (!) 28    Ht _0  (1.676 m)    Wt 54 kg    SpO2 98%    BMI 19.21 kg/m   Physical Exam Vitals signs and nursing note reviewed.  Constitutional:      Appearance: She is well-developed.  HENT:     Head: Normocephalic.     Nose: Nose normal.  Eyes:     General: No scleral icterus.    Conjunctiva/sclera: Conjunctivae normal.  Neck:     Musculoskeletal: Neck supple.     Thyroid: No thyromegaly.  Cardiovascular:     Rate and Rhythm: Normal rate and regular rhythm.     Heart sounds: No murmur. No friction rub. No gallop.   Pulmonary:     Breath sounds: No stridor. No wheezing or rales.  Chest:     Chest wall: No tenderness.  Abdominal:     General: There is no distension.     Tenderness: There is no abdominal tenderness. There is no rebound.  Musculoskeletal: Normal range of motion.  Lymphadenopathy:     Cervical: No cervical  adenopathy.  Skin:    Findings: No erythema or rash.  Neurological:     Mental Status: She is oriented to person, place, and time.     Motor: No abnormal muscle tone.     Coordination: Coordination normal.  Psychiatric:        Behavior: Behavior normal.      ED Treatments / Results  Labs (all labs ordered are listed, but only abnormal results are displayed) Labs Reviewed  CBC WITH DIFFERENTIAL/PLATELET - Abnormal; Notable for the following components:      Result Value   RBC 3.58 (*)    Hemoglobin 11.4 (*)    MCV 103.1 (*)    All other components within normal limits  COMPREHENSIVE METABOLIC PANEL - Abnormal; Notable for the following components:   Glucose, Bld 108 (*)    Creatinine, Ser 1.22 (*)    Total Protein 5.8 (*)    Albumin 3.3 (*)    GFR calc non Af Amer 40 (*)    GFR calc Af Amer 46 (*)    All other components within normal limits  TROPONIN I (HIGH SENSITIVITY)    EKG None  Radiology Dg Chest 2 View  Result Date: 03/21/2019 CLINICAL DATA:  Weakness EXAM: CHEST - 2 VIEW COMPARISON:  08/03/2018 FINDINGS: Cardiac shadow is at the upper limits of normal in size. Postsurgical changes are again seen and stable. Extensive right lung surgical changes are seen as well. No focal infiltrate is noted. Mild scarring in the right upper lobe is noted related to the prior surgery. No sizable effusion is seen. No acute bony abnormality is noted. IMPRESSION: Postsurgical changes with right upper lobe scarring. No acute abnormality seen. Electronically Signed   By: Inez Catalina M.D.   On: 03/21/2019 21:34   Ct Head Wo Contrast  Result Date: 03/21/2019 CLINICAL DATA:  Pt. was on a side-by-side leading a horse and the horse pulled back causing injury to patient. When patient stood up, she had syncopal  episode. EXAM: CT HEAD WITHOUT CONTRAST TECHNIQUE: Contiguous axial images were obtained from the base of the skull through the vertex without intravenous contrast. COMPARISON:  None.  FINDINGS: Brain: Mild central and cortical atrophy. There is no intra or extra-axial fluid collection. Small extra-axial calcified. 5 millimeter mass is identified at the RIGHT posterior parietal region, consistent with small meningioma. There is no associated mass effect. The basilar cisterns and ventricles have a normal appearance. There is no CT evidence for acute infarction or hemorrhage. Vascular: There is dense atherosclerotic calcification of the carotid arteries. No hyperdense vessels. Skull: Normal. Negative for fracture or focal lesion. Sinuses/Orbits: Opacification of the LEFT sphenoid air cell and scattered ethmoid air cells. Orbits are intact. Other: None IMPRESSION: 1. No evidence for acute intracranial abnormality. 2. Mild atrophy. 3. Small RIGHT posterior parietal meningioma without mass effect or edema. 4. Sinus disease. Electronically Signed   By: Nolon Nations M.D.   On: 03/21/2019 21:23    Procedures Procedures (including critical care time)  Medications Ordered in ED Medications  Tdap (BOOSTRIX) injection 0.5 mL (0.5 mLs Intramuscular Given 03/21/19 2346)  HYDROcodone-acetaminophen (NORCO/VICODIN) 5-325 MG per tablet 1 tablet (1 tablet Oral Given 03/21/19 2346)     Initial Impression / Assessment and Plan / ED Course  I have reviewed the triage vital signs and the nursing notes.  Pertinent labs & imaging results that were available during my care of the patient were reviewed by me and considered in my medical decision making (see chart for details).        Patient with multiple skin tears to her right arm and left hand.  Patient also have contusion to forehead.  Labs and x-rays unremarkable.  Syncopal episode was probably vasovagal.  She will follow-up with her PCP Final Clinical Impressions(s) / ED Diagnoses   Final diagnoses:  Fall, initial encounter    ED Discharge Orders    None       Milton Ferguson, MD 03/22/19 1311

## 2019-03-23 DIAGNOSIS — D320 Benign neoplasm of cerebral meninges: Secondary | ICD-10-CM | POA: Diagnosis not present

## 2019-03-23 DIAGNOSIS — G894 Chronic pain syndrome: Secondary | ICD-10-CM | POA: Diagnosis not present

## 2019-03-29 ENCOUNTER — Ambulatory Visit: Payer: Medicare HMO | Admitting: Podiatry

## 2019-03-31 DIAGNOSIS — M545 Low back pain: Secondary | ICD-10-CM | POA: Diagnosis not present

## 2019-04-05 DIAGNOSIS — M48061 Spinal stenosis, lumbar region without neurogenic claudication: Secondary | ICD-10-CM | POA: Diagnosis not present

## 2019-04-05 DIAGNOSIS — M545 Low back pain: Secondary | ICD-10-CM | POA: Diagnosis not present

## 2019-04-19 ENCOUNTER — Other Ambulatory Visit: Payer: Self-pay

## 2019-04-19 ENCOUNTER — Ambulatory Visit: Payer: Medicare HMO | Admitting: Podiatry

## 2019-04-19 DIAGNOSIS — M2041 Other hammer toe(s) (acquired), right foot: Secondary | ICD-10-CM

## 2019-04-19 DIAGNOSIS — M2042 Other hammer toe(s) (acquired), left foot: Secondary | ICD-10-CM | POA: Diagnosis not present

## 2019-04-23 DIAGNOSIS — M48061 Spinal stenosis, lumbar region without neurogenic claudication: Secondary | ICD-10-CM | POA: Diagnosis not present

## 2019-04-23 DIAGNOSIS — M545 Low back pain: Secondary | ICD-10-CM | POA: Diagnosis not present

## 2019-04-25 DIAGNOSIS — G894 Chronic pain syndrome: Secondary | ICD-10-CM | POA: Diagnosis not present

## 2019-05-10 ENCOUNTER — Encounter: Payer: Medicare HMO | Admitting: Podiatry

## 2019-05-10 ENCOUNTER — Telehealth: Payer: Self-pay | Admitting: Podiatry

## 2019-05-10 NOTE — Telephone Encounter (Signed)
I spoke with pt's dtr, [name] and initially told her to have pt continue in the surgery boot. [name] states the boot throws the pt off balance while wearing the diabetic shoe on the other foot. I asked [name] if our office had fitted pt for the diabetic shoe and if so Dr. March Rummage may okay pt going into the diabetic shoe, due to the supportive structure of the shoe.

## 2019-05-10 NOTE — Telephone Encounter (Signed)
Pts daughter called stating the patient is currently in a boot and is scheduled for a follow up appt on 11/12. Pt would like to know if she is able to take the boot off now.   Please give patient a call.

## 2019-05-10 NOTE — Telephone Encounter (Signed)
Dr. March Rummage states pt can go into the diabetic shoe on the surgery foot also. I left message informing pt's dtr, [name].

## 2019-05-14 DIAGNOSIS — M545 Low back pain: Secondary | ICD-10-CM | POA: Diagnosis not present

## 2019-05-14 DIAGNOSIS — M48061 Spinal stenosis, lumbar region without neurogenic claudication: Secondary | ICD-10-CM | POA: Diagnosis not present

## 2019-05-17 ENCOUNTER — Other Ambulatory Visit: Payer: Self-pay

## 2019-05-17 ENCOUNTER — Ambulatory Visit (INDEPENDENT_AMBULATORY_CARE_PROVIDER_SITE_OTHER): Payer: Medicare HMO | Admitting: Podiatry

## 2019-05-17 DIAGNOSIS — Z9889 Other specified postprocedural states: Secondary | ICD-10-CM

## 2019-05-17 NOTE — Progress Notes (Signed)
  Subjective:  Patient ID: Debra [name], female    DOB: Nov 08, 1928,  MRN: [id number]  Chief Complaint  Patient presents with  . Nail Problem    Right 1st nail still discoloration in nail  . Foot Problem    Left 1st toe still red at the tip, no pain.   85 y.o. female returns today for planned flexor tenotomy of the left 2nd/3rd digit.  Objective:  There were no vitals filed for this visit.  General AA&O x3. Normal mood and affect.  Vascular Pedal pulses palpable.  Neurologic Epicritic sensation grossly intact.  Dermatologic Pre-ulcerative callus at the tip of the left, 2nd toe, 3rd toe  Orthopedic: Semi-reducible hammertoe deformity left, 2nd toe, 3rd toe    Assessment & Plan:  Patient was evaluated and treated and all questions answered.  Hammertoe left 2nd/3rd with pre-ulcerative callus -Flexor tenotomy as below. -Advised to remove the dressing in 24 hours and apply a band-aid and triple abx ointment every day thereafter.  Procedure: Flexor Tenotomy Indication for Procedure: toe with semi-reducible hammertoe with distal tip ulceration. Flexor tenotomy indicated to alleviate contracture, reduce pressure, and enhance healing of the ulceration. Location: left, 2nd toe, 3rd toe Anesthesia: Lidocaine 1% plain; 1.5 mL and Marcaine 0.5% plain; 1.5 mL digital block Instrumentation: 18 g needle  Technique: The toe was anesthetized as above and prepped in the usual fashion. The toe was exsanquinated and a tourniquet was secured at the base of the toe. An 18g needle was then used to percutaneously release the flexor tendon at the plantar surface of the toe with noted release of the hammertoe deformity. The incision was then dressed with antibiotic ointment and band-aid. Compression splint dressing applied. Patient tolerated the procedure well. Dressing: Dry, sterile, compression dressing. Disposition: Patient tolerated procedure well. Patient to return in 1 week for follow-up.       Return in about 2 weeks (around 05/03/2019) for Post-op flexor tenotomy check.

## 2019-05-23 DIAGNOSIS — M7061 Trochanteric bursitis, right hip: Secondary | ICD-10-CM | POA: Diagnosis not present

## 2019-05-24 ENCOUNTER — Other Ambulatory Visit: Payer: Self-pay

## 2019-05-24 ENCOUNTER — Ambulatory Visit (INDEPENDENT_AMBULATORY_CARE_PROVIDER_SITE_OTHER): Payer: Medicare HMO | Admitting: Podiatry

## 2019-05-24 DIAGNOSIS — M2042 Other hammer toe(s) (acquired), left foot: Secondary | ICD-10-CM

## 2019-05-24 DIAGNOSIS — M2041 Other hammer toe(s) (acquired), right foot: Secondary | ICD-10-CM

## 2019-05-24 NOTE — Progress Notes (Signed)
Subjective:  Patient ID: Debra [name], female    DOB: 06-Sep-1928,  MRN: [id number]  Chief Complaint  Patient presents with  . Foot Pain    pt is here for flexor tenotomy of toe, pt states that she is concerned with possibly losing feeling under her toes, pt is also concerned with her toes curling up as well    DOS: 04/19/2019 Procedure: Flexor tenotomy L 2nd/3rd toes   85 y.o. female returns for post-op check. States the toes were straight after correction but now are curled again.  Review of Systems: Negative except as noted in the HPI. Denies N/V/F/Ch.  Past Medical History:  Diagnosis Date  . Arthritis   . CHF (congestive heart failure) ([place])   . CKD (chronic kidney disease) stage 4, GFR 15-29 ml/min (HCC)   . Colon cancer ([place])   . COPD (chronic obstructive pulmonary disease) ([place])   . Coronary artery disease    a. s/p CABG in 1997 (no detials provided) b. 12/2017: NSTEMI with cath showing patent LIMA-LAD with occluded SVG-OM and presumed occluded SVG-RCA but unable to be visualized, RCA was filled by collaterals. Medical management recommended.   . DDD (degenerative disc disease), lumbar   . Diabetic neuropathy ([place])   . GERD (gastroesophageal reflux disease)   . Gout   . Hypertension   . Restless leg syndrome   . Type 2 diabetes mellitus ([place])     Current Outpatient Medications:  .  ACCU-CHEK AVIVA PLUS test strip, , Disp: , Rfl:  .  Accu-Chek Softclix Lancets lancets, , Disp: , Rfl:  .  acetaminophen (TYLENOL) 325 MG tablet, Take 650 mg by mouth every 6 (six) hours as needed for mild pain., Disp: , Rfl:  .  albuterol (VENTOLIN HFA) 108 (90 Base) MCG/ACT inhaler, Inhale 1 puff into the lungs every 6 (six) hours as needed for shortness of breath or wheezing., Disp: , Rfl:  .  amoxicillin-clavulanate (AUGMENTIN) 875-125 MG tablet, , Disp: , Rfl:  .  apixaban (ELIQUIS) 2.5 MG TABS tablet, Take 1 tablet (2.5 mg total) by mouth 2 (two) times daily., Disp: 180 tablet, Rfl: 1  .  atorvastatin (LIPITOR) 80 MG tablet, Take 1 tablet (80 mg total) by mouth daily at 6 PM., Disp: 30 tablet, Rfl: 6 .  B-D ULTRAFINE III SHORT PEN 31G X 8 MM MISC, , Disp: , Rfl:  .  Blood Glucose Monitoring Suppl (ACCU-CHEK AVIVA PLUS) w/Device KIT, , Disp: , Rfl:  .  carvedilol (COREG) 3.125 MG tablet, Take 3.125 mg by mouth 2 (two) times daily with a meal., Disp: , Rfl:  .  gabapentin (NEURONTIN) 100 MG capsule, Take 300 mg by mouth 3 (three) times daily. , Disp: , Rfl:  .  gabapentin (NEURONTIN) 300 MG capsule, , Disp: , Rfl:  .  HYDROcodone-acetaminophen (NORCO) 10-325 MG tablet, Take 1 tablet by mouth every 6 (six) hours as needed for moderate pain or severe pain., Disp: , Rfl:  .  insulin glargine (LANTUS) 100 unit/mL SOPN, Inject 0.1 mLs (10 Units total) into the skin at bedtime., Disp: , Rfl:  .  levothyroxine (SYNTHROID) 125 MCG tablet, , Disp: , Rfl:  .  levothyroxine (UNITHROID) 137 MCG tablet, Take by mouth daily before breakfast. , Disp: , Rfl:  .  methylPREDNISolone (MEDROL DOSEPAK) 4 MG TBPK tablet, , Disp: , Rfl:  .  mirtazapine (REMERON) 15 MG tablet, Take 15 mg by mouth at bedtime., Disp: , Rfl:  .  pantoprazole (PROTONIX) 40 MG tablet,  Take 40 mg by mouth daily. , Disp: , Rfl:  .  rOPINIRole (REQUIP) 2 MG tablet, , Disp: , Rfl:  .  sacubitril-valsartan (ENTRESTO) 24-26 MG, Take 1 tablet by mouth 2 (two) times daily. , Disp: , Rfl:   Social History   Tobacco Use  Smoking Status Former Smoker  . Quit date: 03/21/1996  . Years since quitting: 23.1  Smokeless Tobacco Never Used    Allergies  Allergen Reactions  . Ciprofloxacin Rash and Itching  . Iodine   . Iodinated Diagnostic Agents Rash   Objective:  There were no vitals filed for this visit. There is no height or weight on file to calculate BMI. Constitutional Well developed. Well nourished.  Vascular Foot warm and well perfused. Capillary refill normal to all digits.   Neurologic Normal speech. Oriented to  person, place, and time. Epicritic sensation to light touch grossly present bilaterally.  Dermatologic Skin healing well without signs of infection. Skin edges well coapted without signs of infection.  Orthopedic: Tenderness to palpation noted about the surgical site.   Radiographs: None Assessment:   1. Post-operative state    Plan:  Patient was evaluated and treated and all questions answered.  S/p foot surgery left -Progressing as expected post-operatively. -XR: None -WB Status: WBAT in normal shoe -Sutures: none. -Medications: none -Foot redressed. -Discussed plan for repeat procedure with longer post-op immobilization in post-op shoe. -F/u for procedure next visit.  Return in about 1 week (around 05/24/2019) for Flexor Tenotomy.

## 2019-05-25 DIAGNOSIS — Z794 Long term (current) use of insulin: Secondary | ICD-10-CM | POA: Diagnosis not present

## 2019-05-25 DIAGNOSIS — E7849 Other hyperlipidemia: Secondary | ICD-10-CM | POA: Diagnosis not present

## 2019-05-25 DIAGNOSIS — G894 Chronic pain syndrome: Secondary | ICD-10-CM | POA: Diagnosis not present

## 2019-05-25 DIAGNOSIS — I10 Essential (primary) hypertension: Secondary | ICD-10-CM | POA: Diagnosis not present

## 2019-06-06 DIAGNOSIS — I10 Essential (primary) hypertension: Secondary | ICD-10-CM | POA: Diagnosis not present

## 2019-06-06 DIAGNOSIS — Z23 Encounter for immunization: Secondary | ICD-10-CM | POA: Diagnosis not present

## 2019-06-06 DIAGNOSIS — G894 Chronic pain syndrome: Secondary | ICD-10-CM | POA: Diagnosis not present

## 2019-06-06 DIAGNOSIS — I4891 Unspecified atrial fibrillation: Secondary | ICD-10-CM | POA: Diagnosis not present

## 2019-06-06 DIAGNOSIS — Z1389 Encounter for screening for other disorder: Secondary | ICD-10-CM | POA: Diagnosis not present

## 2019-06-06 DIAGNOSIS — E7849 Other hyperlipidemia: Secondary | ICD-10-CM | POA: Diagnosis not present

## 2019-06-06 DIAGNOSIS — Z20828 Contact with and (suspected) exposure to other viral communicable diseases: Secondary | ICD-10-CM | POA: Diagnosis not present

## 2019-06-06 DIAGNOSIS — J449 Chronic obstructive pulmonary disease, unspecified: Secondary | ICD-10-CM | POA: Diagnosis not present

## 2019-06-06 DIAGNOSIS — Z0001 Encounter for general adult medical examination with abnormal findings: Secondary | ICD-10-CM | POA: Diagnosis not present

## 2019-06-06 DIAGNOSIS — Z681 Body mass index (BMI) 19 or less, adult: Secondary | ICD-10-CM | POA: Diagnosis not present

## 2019-06-06 DIAGNOSIS — E559 Vitamin D deficiency, unspecified: Secondary | ICD-10-CM | POA: Diagnosis not present

## 2019-06-06 DIAGNOSIS — I2510 Atherosclerotic heart disease of native coronary artery without angina pectoris: Secondary | ICD-10-CM | POA: Diagnosis not present

## 2019-06-06 DIAGNOSIS — Z794 Long term (current) use of insulin: Secondary | ICD-10-CM | POA: Diagnosis not present

## 2019-06-08 ENCOUNTER — Ambulatory Visit: Payer: Medicare HMO | Admitting: Podiatry

## 2019-06-17 NOTE — Progress Notes (Signed)
  Subjective:  Patient ID: Debra [name], female    DOB: 1929-06-02,  MRN: [id number]  Chief Complaint  Patient presents with  . Hammer Toe    Flexor tenotomy left 2nd and 3rd digits.   85 y.o. female returns today for planned flexor tenotomy of the left second and third toes.    Objective:  There were no vitals filed for this visit.  General AA&O x3. Normal mood and affect.  Vascular Pedal pulses palpable.  Neurologic Epicritic sensation grossly intact.  Dermatologic  healed incision plantar aspect of the second third toes left  Orthopedic: Semi-reducible hammertoe deformity left, 2nd toe, 3rd toe    Assessment & Plan:  Patient was evaluated and treated and all questions answered.  Hammertoe left 2nd/3rd with recurrence -Flexor tenotomy as below. -Advised to remove the dressing in 24 hours and apply a band-aid and triple abx ointment every day thereafter.  Procedure: Flexor Tenotomy Indication for Procedure: toe with semi-reducible hammertoe with distal tip ulceration. Flexor tenotomy indicated to alleviate contracture, reduce pressure, and enhance healing of the ulceration. Location: left, 2nd toe, 3rd toe Anesthesia: Lidocaine 1% plain; 1.5 mL and Marcaine 0.5% plain; 1.5 mL digital block Instrumentation: 18 g needle  Technique: The toe was anesthetized as above and prepped in the usual fashion. The toe was exsanquinated and a tourniquet was secured at the base of the toe. An 18g needle was then used to percutaneously release both flexor tendons at the plantar surface of the toe with noted release of the hammertoe deformity. The incision was then dressed with antibiotic ointment and band-aid. Compression splint dressing applied. Patient tolerated the procedure well. Dressing: Dry, sterile, compression dressing. Disposition: Patient tolerated procedure well. Patient to return in 1 week for follow-up.  Of note the above procedures were more extensive than the previous procedure  performed given recurrence of deformity  Return in about 2 weeks (around 06/07/2019) for flexor tenotomy f/u .

## 2019-06-21 DIAGNOSIS — M7062 Trochanteric bursitis, left hip: Secondary | ICD-10-CM | POA: Diagnosis not present

## 2019-06-21 DIAGNOSIS — M7061 Trochanteric bursitis, right hip: Secondary | ICD-10-CM | POA: Diagnosis not present

## 2019-06-27 DIAGNOSIS — G894 Chronic pain syndrome: Secondary | ICD-10-CM | POA: Diagnosis not present

## 2019-07-25 DIAGNOSIS — G894 Chronic pain syndrome: Secondary | ICD-10-CM | POA: Diagnosis not present

## 2019-07-30 ENCOUNTER — Inpatient Hospital Stay (HOSPITAL_COMMUNITY)
Admission: EM | Admit: 2019-07-30 | Discharge: 2019-08-02 | DRG: 281 | Disposition: A | Discharge status: Home / Self Care | Payer: Medicare HMO | Attending: Internal Medicine | Admitting: Internal Medicine

## 2019-07-30 ENCOUNTER — Emergency Department (HOSPITAL_COMMUNITY): Payer: Medicare HMO

## 2019-07-30 ENCOUNTER — Encounter (HOSPITAL_COMMUNITY): Payer: Self-pay

## 2019-07-30 ENCOUNTER — Other Ambulatory Visit: Payer: Self-pay

## 2019-07-30 DIAGNOSIS — Z9049 Acquired absence of other specified parts of digestive tract: Secondary | ICD-10-CM

## 2019-07-30 DIAGNOSIS — R42 Dizziness and giddiness: Secondary | ICD-10-CM

## 2019-07-30 DIAGNOSIS — R0602 Shortness of breath: Secondary | ICD-10-CM | POA: Diagnosis not present

## 2019-07-30 DIAGNOSIS — K219 Gastro-esophageal reflux disease without esophagitis: Secondary | ICD-10-CM | POA: Diagnosis present

## 2019-07-30 DIAGNOSIS — J449 Chronic obstructive pulmonary disease, unspecified: Secondary | ICD-10-CM | POA: Diagnosis present

## 2019-07-30 DIAGNOSIS — I2510 Atherosclerotic heart disease of native coronary artery without angina pectoris: Secondary | ICD-10-CM | POA: Diagnosis not present

## 2019-07-30 DIAGNOSIS — I252 Old myocardial infarction: Secondary | ICD-10-CM

## 2019-07-30 DIAGNOSIS — J309 Allergic rhinitis, unspecified: Secondary | ICD-10-CM | POA: Diagnosis present

## 2019-07-30 DIAGNOSIS — I4891 Unspecified atrial fibrillation: Secondary | ICD-10-CM | POA: Diagnosis not present

## 2019-07-30 DIAGNOSIS — N183 Chronic kidney disease, stage 3 unspecified: Secondary | ICD-10-CM | POA: Diagnosis present

## 2019-07-30 DIAGNOSIS — I255 Ischemic cardiomyopathy: Secondary | ICD-10-CM | POA: Diagnosis present

## 2019-07-30 DIAGNOSIS — Z955 Presence of coronary angioplasty implant and graft: Secondary | ICD-10-CM

## 2019-07-30 DIAGNOSIS — Z91041 Radiographic dye allergy status: Secondary | ICD-10-CM

## 2019-07-30 DIAGNOSIS — Z800 Family history of malignant neoplasm of digestive organs: Secondary | ICD-10-CM

## 2019-07-30 DIAGNOSIS — E119 Type 2 diabetes mellitus without complications: Secondary | ICD-10-CM

## 2019-07-30 DIAGNOSIS — Z95828 Presence of other vascular implants and grafts: Secondary | ICD-10-CM

## 2019-07-30 DIAGNOSIS — I5022 Chronic systolic (congestive) heart failure: Secondary | ICD-10-CM | POA: Diagnosis not present

## 2019-07-30 DIAGNOSIS — Z20822 Contact with and (suspected) exposure to covid-19: Secondary | ICD-10-CM | POA: Diagnosis present

## 2019-07-30 DIAGNOSIS — I25810 Atherosclerosis of coronary artery bypass graft(s) without angina pectoris: Secondary | ICD-10-CM | POA: Diagnosis not present

## 2019-07-30 DIAGNOSIS — M109 Gout, unspecified: Secondary | ICD-10-CM | POA: Diagnosis present

## 2019-07-30 DIAGNOSIS — R634 Abnormal weight loss: Secondary | ICD-10-CM | POA: Diagnosis present

## 2019-07-30 DIAGNOSIS — M069 Rheumatoid arthritis, unspecified: Secondary | ICD-10-CM | POA: Diagnosis present

## 2019-07-30 DIAGNOSIS — E0590 Thyrotoxicosis, unspecified without thyrotoxic crisis or storm: Secondary | ICD-10-CM | POA: Diagnosis present

## 2019-07-30 DIAGNOSIS — F419 Anxiety disorder, unspecified: Secondary | ICD-10-CM | POA: Diagnosis present

## 2019-07-30 DIAGNOSIS — R197 Diarrhea, unspecified: Secondary | ICD-10-CM | POA: Diagnosis not present

## 2019-07-30 DIAGNOSIS — E1140 Type 2 diabetes mellitus with diabetic neuropathy, unspecified: Secondary | ICD-10-CM | POA: Diagnosis present

## 2019-07-30 DIAGNOSIS — Z8249 Family history of ischemic heart disease and other diseases of the circulatory system: Secondary | ICD-10-CM

## 2019-07-30 DIAGNOSIS — Z881 Allergy status to other antibiotic agents status: Secondary | ICD-10-CM

## 2019-07-30 DIAGNOSIS — Z85038 Personal history of other malignant neoplasm of large intestine: Secondary | ICD-10-CM

## 2019-07-30 DIAGNOSIS — I480 Paroxysmal atrial fibrillation: Secondary | ICD-10-CM | POA: Diagnosis not present

## 2019-07-30 DIAGNOSIS — G2581 Restless legs syndrome: Secondary | ICD-10-CM | POA: Diagnosis present

## 2019-07-30 DIAGNOSIS — E785 Hyperlipidemia, unspecified: Secondary | ICD-10-CM | POA: Diagnosis present

## 2019-07-30 DIAGNOSIS — I2582 Chronic total occlusion of coronary artery: Secondary | ICD-10-CM | POA: Diagnosis present

## 2019-07-30 DIAGNOSIS — Z66 Do not resuscitate: Secondary | ICD-10-CM | POA: Diagnosis present

## 2019-07-30 DIAGNOSIS — E1122 Type 2 diabetes mellitus with diabetic chronic kidney disease: Secondary | ICD-10-CM | POA: Diagnosis present

## 2019-07-30 DIAGNOSIS — J439 Emphysema, unspecified: Secondary | ICD-10-CM | POA: Diagnosis not present

## 2019-07-30 DIAGNOSIS — Z902 Acquired absence of lung [part of]: Secondary | ICD-10-CM

## 2019-07-30 DIAGNOSIS — Z90710 Acquired absence of both cervix and uterus: Secondary | ICD-10-CM

## 2019-07-30 DIAGNOSIS — Z79899 Other long term (current) drug therapy: Secondary | ICD-10-CM

## 2019-07-30 DIAGNOSIS — E861 Hypovolemia: Secondary | ICD-10-CM

## 2019-07-30 DIAGNOSIS — R778 Other specified abnormalities of plasma proteins: Secondary | ICD-10-CM | POA: Diagnosis not present

## 2019-07-30 DIAGNOSIS — Z794 Long term (current) use of insulin: Secondary | ICD-10-CM

## 2019-07-30 DIAGNOSIS — I959 Hypotension, unspecified: Secondary | ICD-10-CM | POA: Diagnosis not present

## 2019-07-30 DIAGNOSIS — Z7901 Long term (current) use of anticoagulants: Secondary | ICD-10-CM

## 2019-07-30 DIAGNOSIS — I340 Nonrheumatic mitral (valve) insufficiency: Secondary | ICD-10-CM | POA: Diagnosis not present

## 2019-07-30 DIAGNOSIS — I130 Hypertensive heart and chronic kidney disease with heart failure and stage 1 through stage 4 chronic kidney disease, or unspecified chronic kidney disease: Secondary | ICD-10-CM | POA: Diagnosis not present

## 2019-07-30 DIAGNOSIS — Z87891 Personal history of nicotine dependence: Secondary | ICD-10-CM

## 2019-07-30 DIAGNOSIS — I214 Non-ST elevation (NSTEMI) myocardial infarction: Secondary | ICD-10-CM

## 2019-07-30 DIAGNOSIS — Z85118 Personal history of other malignant neoplasm of bronchus and lung: Secondary | ICD-10-CM

## 2019-07-30 DIAGNOSIS — E1151 Type 2 diabetes mellitus with diabetic peripheral angiopathy without gangrene: Secondary | ICD-10-CM | POA: Diagnosis present

## 2019-07-30 DIAGNOSIS — F4323 Adjustment disorder with mixed anxiety and depressed mood: Secondary | ICD-10-CM | POA: Diagnosis present

## 2019-07-30 DIAGNOSIS — I9589 Other hypotension: Secondary | ICD-10-CM

## 2019-07-30 DIAGNOSIS — Z79890 Hormone replacement therapy (postmenopausal): Secondary | ICD-10-CM

## 2019-07-30 DIAGNOSIS — I10 Essential (primary) hypertension: Secondary | ICD-10-CM

## 2019-07-30 DIAGNOSIS — Z888 Allergy status to other drugs, medicaments and biological substances status: Secondary | ICD-10-CM

## 2019-07-30 LAB — COMPREHENSIVE METABOLIC PANEL
ALT: 13 U/L (ref 0–44)
AST: 17 U/L (ref 15–41)
Albumin: 3.1 g/dL — ABNORMAL LOW (ref 3.5–5.0)
Alkaline Phosphatase: 60 U/L (ref 38–126)
Anion gap: 8 (ref 5–15)
BUN: 27 mg/dL — ABNORMAL HIGH (ref 8–23)
CO2: 23 mmol/L (ref 22–32)
Calcium: 8.7 mg/dL — ABNORMAL LOW (ref 8.9–10.3)
Chloride: 107 mmol/L (ref 98–111)
Creatinine, Ser: 1.29 mg/dL — ABNORMAL HIGH (ref 0.44–1.00)
GFR calc Af Amer: 43 mL/min — ABNORMAL LOW (ref >60)
GFR calc non Af Amer: 37 mL/min — ABNORMAL LOW (ref >60)
Glucose, Bld: 103 mg/dL — ABNORMAL HIGH (ref 70–99)
Potassium: 3.6 mmol/L (ref 3.5–5.1)
Sodium: 138 mmol/L (ref 135–145)
Total Bilirubin: 0.8 mg/dL (ref 0.3–1.2)
Total Protein: 5.5 g/dL — ABNORMAL LOW (ref 6.5–8.1)

## 2019-07-30 LAB — URINALYSIS, ROUTINE W REFLEX MICROSCOPIC
Bacteria, UA: NONE SEEN
Bilirubin Urine: NEGATIVE
Glucose, UA: NEGATIVE mg/dL
Hgb urine dipstick: NEGATIVE
Ketones, ur: NEGATIVE mg/dL
Nitrite: NEGATIVE
Protein, ur: 30 mg/dL — AB
Specific Gravity, Urine: 1.013 (ref 1.005–1.030)
pH: 5 (ref 5.0–8.0)

## 2019-07-30 LAB — CBC WITH DIFFERENTIAL/PLATELET
Abs Immature Granulocytes: 0.02 10*3/uL (ref 0.00–0.07)
Basophils Absolute: 0 10*3/uL (ref 0.0–0.1)
Basophils Relative: 0 %
Eosinophils Absolute: 0.1 10*3/uL (ref 0.0–0.5)
Eosinophils Relative: 1 %
HCT: 36.9 % (ref 36.0–46.0)
Hemoglobin: 11.6 g/dL — ABNORMAL LOW (ref 12.0–15.0)
Immature Granulocytes: 0 %
Lymphocytes Relative: 11 %
Lymphs Abs: 0.8 10*3/uL (ref 0.7–4.0)
MCH: 33 pg (ref 26.0–34.0)
MCHC: 31.4 g/dL (ref 30.0–36.0)
MCV: 104.8 fL — ABNORMAL HIGH (ref 80.0–100.0)
Monocytes Absolute: 0.5 10*3/uL (ref 0.1–1.0)
Monocytes Relative: 7 %
Neutro Abs: 6.1 10*3/uL (ref 1.7–7.7)
Neutrophils Relative %: 81 %
Platelets: 222 10*3/uL (ref 150–400)
RBC: 3.52 MIL/uL — ABNORMAL LOW (ref 3.87–5.11)
RDW: 15.6 % — ABNORMAL HIGH (ref 11.5–15.5)
WBC: 7.5 10*3/uL (ref 4.0–10.5)
nRBC: 0 % (ref 0.0–0.2)

## 2019-07-30 LAB — TROPONIN I (HIGH SENSITIVITY)
Troponin I (High Sensitivity): 170 ng/L (ref <18)
Troponin I (High Sensitivity): 1268 ng/L (ref <18)
Troponin I (High Sensitivity): 7875 ng/L (ref <18)

## 2019-07-30 LAB — LIPASE, BLOOD: Lipase: 21 U/L (ref 11–51)

## 2019-07-30 LAB — GLUCOSE, CAPILLARY: Glucose-Capillary: 100 mg/dL — ABNORMAL HIGH (ref 70–99)

## 2019-07-30 LAB — RESPIRATORY PANEL BY RT PCR (FLU A&B, COVID)
Influenza A by PCR: NEGATIVE
Influenza B by PCR: NEGATIVE
SARS Coronavirus 2 by RT PCR: NEGATIVE

## 2019-07-30 LAB — TSH: TSH: 0.019 u[IU]/mL — ABNORMAL LOW (ref 0.350–4.500)

## 2019-07-30 LAB — POC SARS CORONAVIRUS 2 AG -  ED: SARS Coronavirus 2 Ag: NEGATIVE

## 2019-07-30 LAB — MRSA PCR SCREENING: MRSA by PCR: NEGATIVE

## 2019-07-30 LAB — MAGNESIUM: Magnesium: 1.7 mg/dL (ref 1.7–2.4)

## 2019-07-30 MED ORDER — POLYETHYLENE GLYCOL 3350 17 G PO PACK: 17.0000 g | PACK | Freq: Every day | ORAL | Status: DC | PRN

## 2019-07-30 MED ORDER — DILTIAZEM HCL-DEXTROSE 125-5 MG/125ML-% IV SOLN (PREMIX)
5.0000 mg/h | INTRAVENOUS | Status: DC
  Administered 2019-07-30: 5 mg/h via INTRAVENOUS
  Filled 2019-07-30: qty 125

## 2019-07-30 MED ORDER — METOPROLOL TARTRATE 25 MG PO TABS
25.0000 mg | ORAL_TABLET | Freq: Four times a day (QID) | ORAL | Status: DC
  Administered 2019-07-30 – 2019-08-01 (×6): 25 mg via ORAL
  Filled 2019-07-30 (×7): qty 1

## 2019-07-30 MED ORDER — DILTIAZEM LOAD VIA INFUSION
5.0000 mg | Freq: Once | INTRAVENOUS | Status: AC
  Administered 2019-07-30: 5 mg via INTRAVENOUS
  Filled 2019-07-30: qty 5

## 2019-07-30 MED ORDER — ALBUTEROL SULFATE (2.5 MG/3ML) 0.083% IN NEBU: 3.0000 mL | INHALATION_SOLUTION | Freq: Four times a day (QID) | RESPIRATORY_TRACT | Status: DC | PRN

## 2019-07-30 MED ORDER — APIXABAN 2.5 MG PO TABS
2.5000 mg | ORAL_TABLET | Freq: Two times a day (BID) | ORAL | Status: DC
  Administered 2019-07-30: 2.5 mg via ORAL
  Filled 2019-07-30 (×5): qty 1

## 2019-07-30 MED ORDER — PANTOPRAZOLE SODIUM 40 MG PO TBEC
40.0000 mg | DELAYED_RELEASE_TABLET | Freq: Every day | ORAL | Status: DC
  Administered 2019-07-30 – 2019-08-02 (×4): 40 mg via ORAL
  Filled 2019-07-30 (×4): qty 1

## 2019-07-30 MED ORDER — GABAPENTIN 100 MG PO CAPS
100.0000 mg | ORAL_CAPSULE | Freq: Three times a day (TID) | ORAL | Status: DC
  Administered 2019-07-30 – 2019-08-02 (×8): 100 mg via ORAL
  Filled 2019-07-30 (×8): qty 1

## 2019-07-30 MED ORDER — ATORVASTATIN CALCIUM 80 MG PO TABS
80.0000 mg | ORAL_TABLET | Freq: Every day | ORAL | Status: DC
  Administered 2019-07-30 – 2019-08-01 (×3): 80 mg via ORAL
  Filled 2019-07-30: qty 1
  Filled 2019-07-30: qty 2
  Filled 2019-07-30: qty 1

## 2019-07-30 MED ORDER — POTASSIUM CHLORIDE CRYS ER 20 MEQ PO TBCR
40.0000 meq | EXTENDED_RELEASE_TABLET | ORAL | Status: AC
  Administered 2019-07-30 (×2): 40 meq via ORAL
  Filled 2019-07-30 (×2): qty 2

## 2019-07-30 MED ORDER — DIAZEPAM 2 MG PO TABS
2.0000 mg | ORAL_TABLET | Freq: Every day | ORAL | Status: DC | PRN
  Administered 2019-07-30 – 2019-08-01 (×3): 2 mg via ORAL
  Filled 2019-07-30 (×3): qty 1

## 2019-07-30 MED ORDER — INSULIN ASPART 100 UNIT/ML ~~LOC~~ SOLN
0.0000 [IU] | Freq: Every day | SUBCUTANEOUS | Status: DC
  Administered 2019-08-01: 3 [IU] via SUBCUTANEOUS

## 2019-07-30 MED ORDER — ONDANSETRON HCL 4 MG/2ML IJ SOLN
4.0000 mg | Freq: Four times a day (QID) | INTRAMUSCULAR | Status: DC | PRN
  Administered 2019-07-31: 4 mg via INTRAVENOUS
  Filled 2019-07-30: qty 2

## 2019-07-30 MED ORDER — ACETAMINOPHEN 650 MG RE SUPP: 650.0000 mg | Freq: Four times a day (QID) | RECTAL | Status: DC | PRN

## 2019-07-30 MED ORDER — INSULIN ASPART 100 UNIT/ML ~~LOC~~ SOLN
0.0000 [IU] | Freq: Three times a day (TID) | SUBCUTANEOUS | Status: DC
  Administered 2019-07-31: 1 [IU] via SUBCUTANEOUS
  Administered 2019-07-31: 2 [IU] via SUBCUTANEOUS
  Administered 2019-08-01: 7 [IU] via SUBCUTANEOUS
  Administered 2019-08-01: 3 [IU] via SUBCUTANEOUS
  Administered 2019-08-01: 2 [IU] via SUBCUTANEOUS
  Administered 2019-08-02: 3 [IU] via SUBCUTANEOUS
  Administered 2019-08-02: 2 [IU] via SUBCUTANEOUS

## 2019-07-30 MED ORDER — ETOMIDATE 2 MG/ML IV SOLN
INTRAVENOUS | Status: AC | PRN
  Administered 2019-07-30: 6 mg via INTRAVENOUS

## 2019-07-30 MED ORDER — ONDANSETRON HCL 4 MG PO TABS: 4.0000 mg | ORAL_TABLET | Freq: Four times a day (QID) | ORAL | Status: DC | PRN

## 2019-07-30 MED ORDER — MAGNESIUM SULFATE IN D5W 1-5 GM/100ML-% IV SOLN
1.0000 g | Freq: Once | INTRAVENOUS | Status: AC
  Administered 2019-07-30: 1 g via INTRAVENOUS
  Filled 2019-07-30: qty 100

## 2019-07-30 MED ORDER — ASPIRIN 325 MG PO TABS
325.0000 mg | ORAL_TABLET | Freq: Once | ORAL | Status: AC
  Administered 2019-07-30: 325 mg via ORAL
  Filled 2019-07-30: qty 1

## 2019-07-30 MED ORDER — ACETAMINOPHEN 325 MG PO TABS
650.0000 mg | ORAL_TABLET | Freq: Four times a day (QID) | ORAL | Status: DC | PRN
  Administered 2019-07-30 – 2019-08-01 (×3): 650 mg via ORAL
  Filled 2019-07-30 (×3): qty 2

## 2019-07-30 MED ORDER — ETOMIDATE 2 MG/ML IV SOLN
6.0000 mg | Freq: Once | INTRAVENOUS | Status: AC
  Administered 2019-07-30: 6 mg via INTRAVENOUS
  Filled 2019-07-30: qty 10

## 2019-07-30 NOTE — ED Triage Notes (Addendum)
EMS reports daughter says pt's bp low this morning, was unable to read.  EMS says when they arrived pt's bp was in 16'X systolic.  Pt alert and oriented, c/o sob, generalized weakness, nausea, and dry heaving.  EMS started IV and started fluid bolus.  Bp 73/57 upon arrival to er.  Pt says she's feeling a little better.  Reports still sob "a little bit."  Denies nausea.  C/O chronic left hip pain.  EMS says room air o2 sat was 98% but placed on o2 at 2liters for comfort.  Ems reports pt has been having problems with bp flucutating and doctor recently took her off of her bp medications.

## 2019-07-30 NOTE — ED Notes (Addendum)
Daughter at bedside.  Pt was shocked times 2.  AFIB HR 140's.  EtCO2 30, resp 14

## 2019-07-30 NOTE — ED Notes (Signed)
EtCO@ 30, Resp 14

## 2019-07-30 NOTE — ED Provider Notes (Signed)
Medical screening examination/treatment/procedure(s) were conducted as a shared visit with non-physician practitioner(s) and myself.  I personally evaluated the patient during the encounter.  Patient arrives to the emergency department with hypotension in the setting of A. fib with RVR.  She is anticoagulated on Eliquis and compliant with his medication.  Patient is persistently hypotensive despite IV fluid boluses.  No infection signs or symptoms.  Doubt PE clinically.  I suspect that A. fib with RVR is causing partly significantly contributing to her hypotension.  I discussed with the patient that electrical cardioversion is likely her best option at this time.  I am concerned that attempt at rate control would further drop the blood pressure.  Patient provides verbal consent.  Also spoke with her daughter by phone who agrees.  Electrical cardioversion unsuccessful x2 but slowed rate slightly allowing blood pressure to increase.  Once blood pressure increased we were able to start diltiazem infusion and patient has continued to improve. No immediate complications from cardioversion. Labs and imaging reviewed. Plan for admit.   .Cardioversion  Date/Time: 07/30/2019 3:44 PM Performed by: Margette Fast, MD Authorized by: Margette Fast, MD   Consent:    Consent obtained:  Emergent situation and verbal   Consent given by:  Patient and guardian   Risks discussed:  Cutaneous burn, death, induced arrhythmia and pain   Alternatives discussed:  Rate-control medication Pre-procedure details:    Cardioversion basis:  Emergent   Rhythm:  Atrial fibrillation   Electrode placement:  Anterior-posterior Patient sedated: Yes. Refer to sedation procedure documentation for details of sedation.  Attempt one:    Cardioversion mode:  Synchronous   Shock (Joules):  100   Shock outcome:  No change in rhythm (Slowed rate in to the 70s then back to RVR shortly afterwards) Attempt two:    Cardioversion mode:   Synchronous   Shock (Joules):  120   Shock outcome:  No change in rhythm Post-procedure details:    Patient status:  Awake   Patient tolerance of procedure:  Tolerated well, no immediate complications .Sedation  Date/Time: 07/30/2019 3:46 PM Performed by: Margette Fast, MD Authorized by: Margette Fast, MD   Consent:    Consent obtained:  Verbal   Consent given by:  Patient and guardian   Risks discussed:  Allergic reaction, dysrhythmia, inadequate sedation, nausea, prolonged hypoxia resulting in organ damage, respiratory compromise necessitating ventilatory assistance and intubation, prolonged sedation necessitating reversal and vomiting Universal protocol:    Immediately prior to procedure a time out was called: yes     Patient identity confirmation method:  Arm band and verbally with patient Indications:    Procedure performed:  Cardioversion   Procedure necessitating sedation performed by:  Physician performing sedation Pre-sedation assessment:    Time since last food or drink:  5 hours   ASA classification: class 1 - normal, healthy patient     Mallampati score:  I - soft palate, uvula, fauces, pillars visible   Pre-sedation assessments completed and reviewed: airway patency, cardiovascular function, hydration status, mental status, nausea/vomiting, pain level, respiratory function and temperature   Immediate pre-procedure details:    Reassessment: Patient reassessed immediately prior to procedure     Reviewed: vital signs, relevant labs/tests and NPO status     Verified: bag valve mask available, emergency equipment available, intubation equipment available, IV patency confirmed, oxygen available and suction available   Procedure details (see MAR for exact dosages):    Preoxygenation:  Nasal cannula   Sedation:  Etomidate   Intended level of sedation: deep   Intra-procedure monitoring:  Blood pressure monitoring, cardiac monitor, continuous pulse oximetry, frequent LOC  assessments and frequent vital sign checks   Intra-procedure events: none     Total Provider sedation time (minutes):  30 Post-procedure details:    Attendance: Constant attendance by certified staff until patient recovered     Recovery: Patient returned to pre-procedure baseline     Post-sedation assessments completed and reviewed: airway patency, cardiovascular function, hydration status, mental status, nausea/vomiting, pain level, respiratory function and temperature     Patient is stable for discharge or admission: yes     Patient tolerance:  Tolerated well, no immediate complications .Critical Care Performed by: Margette Fast, MD Authorized by: Margette Fast, MD   Critical care provider statement:    Critical care time (minutes):  45   Critical care time was exclusive of:  Separately billable procedures and treating other patients and teaching time   Critical care was necessary to treat or prevent imminent or life-threatening deterioration of the following conditions:  Circulatory failure   Critical care was time spent personally by me on the following activities:  Blood draw for specimens, development of treatment plan with patient or surrogate, discussions with consultants, discussions with primary provider, evaluation of patient's response to treatment, examination of patient, obtaining history from patient or surrogate, ordering and performing treatments and interventions, ordering and review of laboratory studies, ordering and review of radiographic studies, pulse oximetry, re-evaluation of patient's condition, review of old charts and ventilator management   I assumed direction of critical care for this patient from another provider in my specialty: no       Nanda Quinton, MD Emergency Medicine    Nainoa Woldt, Wonda Olds, MD 07/30/19 [phone]

## 2019-07-30 NOTE — ED Notes (Signed)
Daughter at bedside.  Color is better.

## 2019-07-30 NOTE — ED Notes (Signed)
Daughter reports, pt has lose about 30 pounds in 3 months and about 80-90 pounds in last year.

## 2019-07-30 NOTE — ED Notes (Signed)
Date and time results received: 07/30/19 1624 (use smartphrase ".now" to insert current time)  Test: tropnin Critical Value: 1268  Name of Provider Notified: Dr Arlyce Dice  Orders Received? Or Actions Taken?: see new orders

## 2019-07-30 NOTE — H&P (Addendum)
History and Physical    SANAE WILLETTS EXN:[id number] DOB: [birth year] DOA: 07/30/2019  PCP: Sharilyn Sites, MD   Patient coming from: Home  I have personally briefly reviewed patient's old medical records in [place]  Chief Complaint: Shortness of breath  HPI: Debra [name] is a 86 y.o. female with medical history significant for hypertension, diabetes mellitus, rheumatoid arthritis, CKD 3, systolic heart failure, asthma and COPD, luetschers syndrome. Patient presented to the ED with complaints of difficulty breathing when she woke up at about 9 AM.  Difficulty breathing is worse with activity.  She also reports generalized weakness nausea and reports 1 episode of vomiting small amounts 4 days ago and again today.  No abdominal pain.  No fevers no chills.  No sore throat.  No cough.  No leg swelling.   She reports compliance with her medications including Eliquis but recently she was taking off her blood pressure medications due to fluctuating blood pressures. Denies chest pain.  Patient also reports significant weight gain in the past year.  She has a history of colon cancer ~ 2005, and lung cancer.  For which she had resections of both.  Without chemotherapy or radiation therapy.  Currently not getting colonoscopies because of her age.  On EMS arrival, patient's blood pressure systolic was in the 49Q.  ED Course: In atrial fibrillation, Tachycardiac up to the 150s, with blood pressure as low as 57/43.  Potassium 3.6, Hs troponin 170.  Chest x-ray showing cardiomegaly and emphysema without acute findings.   EDP was unable to reach cardiology. Patient was cardioverted in the ED twice with 100j and then 120 J.  She did not convert to sinus rhythm, but there was improvement in her heart rate.  She was subsequently given 5mg  bolus of Cardizem and started on Cardizem drip, with subsequent improvement in her heart rate and blood pressure systolic currently in the 110s to 120s, heart rates 90s  to 100s.   Review of Systems: As per HPI all other systems reviewed and negative.  Past Medical History:  Diagnosis Date  . Arthritis   . CHF (congestive heart failure) ([place])   . CKD (chronic kidney disease) stage 4, GFR 15-29 ml/min (HCC)   . Colon cancer ([place])   . COPD (chronic obstructive pulmonary disease) ([place])   . Coronary artery disease    a. s/p CABG in 1997 (no detials provided) b. 12/2017: NSTEMI with cath showing patent LIMA-LAD with occluded SVG-OM and presumed occluded SVG-RCA but unable to be visualized, RCA was filled by collaterals. Medical management recommended.   . DDD (degenerative disc disease), lumbar   . Diabetic neuropathy ([place])   . GERD (gastroesophageal reflux disease)   . Gout   . Hypertension   . Restless leg syndrome   . Thyroid disease   . Type 2 diabetes mellitus ([place])     Past Surgical History:  Procedure Laterality Date  . ABDOMINAL HYSTERECTOMY    . CHOLECYSTECTOMY    . COLON RESECTION    . CORONARY ARTERY BYPASS GRAFT    . ILIAC ARTERY STENT    . LEFT HEART CATH AND CORS/GRAFTS ANGIOGRAPHY N/A 12/28/2017   Procedure: LEFT HEART CATH AND CORS/GRAFTS ANGIOGRAPHY;  Surgeon: Martinique, Peter M, MD;  Location: [facility];  Service: Cardiovascular;  Laterality: N/A;  . LUNG SURGERY Left 2012   left lung resection  . RENAL ARTERY STENT    . THYROID SURGERY    . ULTRASOUND GUIDANCE FOR VASCULAR ACCESS  12/28/2017   Procedure: Ultrasound Guidance For Vascular Access;  Surgeon: Martinique, Peter M, MD;  Location: [facility];  Service: Cardiovascular;;     reports that she quit smoking about 23 years ago. She has never used smokeless tobacco. She reports that she does not drink alcohol or use drugs.  Allergies  Allergen Reactions  . Ciprofloxacin Rash and Itching  . Iodine   . Iodinated Diagnostic Agents Rash    Family History  Problem Relation Age of Onset  . Colon cancer Mother   . Heart disease Father   . Heart attack Sister   .  Breast cancer Daughter     Prior to Admission medications   Medication Sig Start Date End Date Taking? Authorizing Provider  acetaminophen (TYLENOL) 325 MG tablet Take 650 mg by mouth every 6 (six) hours as needed for mild pain.   Yes [provider]  albuterol (VENTOLIN HFA) 108 (90 Base) MCG/ACT inhaler Inhale 1 puff into the lungs every 6 (six) hours as needed for shortness of breath or wheezing.   Yes [provider]  apixaban (ELIQUIS) 2.5 MG TABS tablet Take 1 tablet (2.5 mg total) by mouth 2 (two) times daily. 07/20/18  Yes [name], MD  atorvastatin (LIPITOR) 80 MG tablet Take 1 tablet (80 mg total) by mouth daily at 6 PM. 12/31/17  Yes Strader, [place] M, PA-C  carvedilol (COREG) 3.125 MG tablet Take 3.125 mg by mouth 2 (two) times daily with a meal.   Yes [provider]  Cholecalciferol (VITAMIN D) 125 MCG (5000 UT) CAPS Take 1 capsule by mouth daily.   Yes [provider]  diazepam (VALIUM) 2 MG tablet Take 2 mg by mouth daily as needed. 06/21/19  Yes [provider]  gabapentin (NEURONTIN) 100 MG capsule Take 100 mg by mouth 3 (three) times daily.  03/09/18  Yes [provider]  HYDROcodone-acetaminophen (NORCO) 10-325 MG tablet Take 1 tablet by mouth every 6 (six) hours as needed for moderate pain or severe pain. 1 tab po hs/ and prn.   Yes [provider]  insulin glargine (LANTUS) 100 unit/mL SOPN Inject 0.1 mLs (10 Units total) into the skin at bedtime. 02/24/19  Yes Barton Dubois, MD  levothyroxine (SYNTHROID) 112 MCG tablet Take 1 tablet by mouth daily. 06/08/19  Yes [provider]  Lidocaine HCl-Benzyl Alcohol (SALONPAS LIDOCAINE PLUS) 4-10 % LIQD Apply 1 application topically as needed.   Yes [provider]  Menthol, Topical Analgesic, (ASPERCREME MAX ROLL-ON) 16 % LIQD Apply 1 application topically as needed.   Yes [provider]  Omega-3 Fatty Acids (FISH OIL) 1000 MG CAPS Take  1 tablet by mouth daily.   Yes [provider]  pantoprazole (PROTONIX) 40 MG tablet Take 40 mg by mouth daily.  10/11/18  Yes [provider]  rOPINIRole (REQUIP) 2 MG tablet Take 2 mg by mouth at bedtime.  09/03/18  Yes [provider]  sacubitril-valsartan (ENTRESTO) 24-26 MG Take 1 tablet by mouth 2 (two) times daily.  02/13/18  Yes [provider]  amoxicillin-clavulanate (AUGMENTIN) 875-125 MG tablet  04/03/19   [provider]    Physical Exam: Vitals:   07/30/19 1445 07/30/19 1500 07/30/19 1515 07/30/19 1530  BP: (!) 118/53 (!) 107/49 (!) 101/51 (!) 100/48  Pulse: 92 99 95 83  Resp: 12 (!) 22 (!) 22 (!) 23  Temp:      SpO2: 100% 100% 100% 100%  Weight:  Height:        Constitutional: NAD, calm, comfortable Vitals:   07/30/19 1445 07/30/19 1500 07/30/19 1515 07/30/19 1530  BP: (!) 118/53 (!) 107/49 (!) 101/51 (!) 100/48  Pulse: 92 99 95 83  Resp: 12 (!) 22 (!) 22 (!) 23  Temp:      SpO2: 100% 100% 100% 100%  Weight:      Height:       Eyes: PERRL, lids and conjunctivae normal ENMT: Mucous membranes are moist. Posterior pharynx clear of any exudate or lesions.  Neck: normal, supple, no masses, no thyromegaly Respiratory: clear to auscultation bilaterally, no wheezing, no crackles. Normal respiratory effort. No accessory muscle use.  Cardiovascular: Irregular rate and rhythm, no murmurs / rubs / gallops. No extremity edema. 2+ pedal pulses.  Abdomen: no tenderness, no masses palpated. No hepatosplenomegaly. Bowel sounds positive.  Musculoskeletal: no clubbing / cyanosis. No joint deformity upper and lower extremities. Skin: no rashes, lesions, ulcers. No induration Neurologic: No gross cranial nerve abnormality, moving all extremities spontaneously. Psychiatric: Normal judgment and insight. Alert and oriented x 3. Normal mood.   Labs on Admission: I have personally reviewed following labs and imaging studies  CBC: Recent Labs   Lab 07/30/19 1343  WBC 7.5  NEUTROABS 6.1  HGB 11.6*  HCT 36.9  MCV 104.8*  PLT 580   Basic Metabolic Panel: Recent Labs  Lab 07/30/19 1343  NA 138  K 3.6  CL 107  CO2 23  GLUCOSE 103*  BUN 27*  CREATININE 1.29*  CALCIUM 8.7*   Liver Function Tests: Recent Labs  Lab 07/30/19 1343  AST 17  ALT 13  ALKPHOS 60  BILITOT 0.8  PROT 5.5*  ALBUMIN 3.1*   Recent Labs  Lab 07/30/19 1343  LIPASE 21    Radiological Exams on Admission: DG Chest Portable 1 View  Result Date: 07/30/2019 CLINICAL DATA:  Generalized weakness with nausea and dry heaving. EXAM: PORTABLE CHEST 1 VIEW COMPARISON:  03/21/2019 FINDINGS: Lungs are hyperexpanded. Interstitial markings are diffusely coarsened with chronic features. Staple line noted right upper lung. Cardiopericardial silhouette is at upper limits of normal for size. The visualized bony structures of the thorax are intact. Telemetry leads overlie the chest. IMPRESSION: Cardiomegaly with emphysema.  No acute cardiopulmonary findings. Electronically Signed   By: Misty Stanley M.D.   On: 07/30/2019 12:56    EKG: Independently reviewed.  Atrial fibrillation, initial EKG with ST depressions and T wave changes in 2 3 aVF V4 through V6 T wave changes in 1 and aVL.  But subsequent EKG in ED assisted with patient's prior EKGs, which resolved changes.  Assessment/Plan Active Problems:   Atrial fibrillation with RVR (HCC)    Atrial fibrillation with RVR- tachycardia rates up to 150s with hypotension, sudden onset difficulty breathing today. S/p cardioversion x2 101 20 J in ED.  Started on Cardizem drip with improvement in rates and blood pressure.  But echo from 2019 shows EF of 35 to 40% and 2020 shows EF of 40 to 45%. - I talked to cardiology at [facility], Dr Marlou Porch, considering systolic heart failure, recommends weaning off Cardizem and starting metoprolol titrate 25 mg every 6 hourly, with holding parameters.  If this is not effective to consider  starting amiodarone, 150 bolus and drip, might convert her to sinus rhythm as well as improving her heart rate. -Wean off Cardizem, initiate metoprolol p.o. -Continue home Eliquis, she has been complaint -Inpatient cardiology consult - TSH low at 0.019 - Mag- 1.7,  1 g magnesium sulphate given  History of CABG 1997, with elevated troponin-  170 > 1,268.  With initial EKG in ED showing diffuse ST and T wave changes.  She denies chest pain.  Her dyspnea resolved as her heart rates and blood pressure improved.  Last cardiac cath 2019 shows three-vessel occlusive coronary artery disease (see detailed report). -I talked to cardiology, Dr. Marlou Porch, considering EKG findings, impression- type II myocardial infarction, from A. fib with RVR especially considering patient's history of coronary artery disease.  Recommend continuing Eliquis -Trend troponin -Continue home Eliquis -Cardiology consult - resume statins -Addendum-significant increase in troponin to 7875.  Reconsulted cardiology, talked to fellow on-call Dr. Madelon Lips, recommended trending troponin.  Considering CKD and likely explanation of demand ischemia, does not recommend transferring to [facility] at this time. -Will give aspirin 325x1.  Systolic CHF-currently appears euvolemic, but echo from 2019 shows EF of 35 to 40% and 2020 shows EF of 40 to 45%.  Not on Lasix.  -Entresto, on hold for now, consider resuming in a.m. with improved blood pressure. - Low-dose carvedilol 3.125 mg on hold for now while started on metoprolol  Weight loss hx- daughter reports dramatic unintentional weight loss in the past year, per charts 161 to 119Lbs  today. Hx of lung and colon cancer, with resections done, no chemo or radiation therapy. Mother died of colon cancer. No blood in stools.  - Tsh quite low at 0.019, will hold synthroid - Check T3, T4 - Further work up as out patient.  Diabetes mellitus-random glucose 103. - Hgba1c - SSI - S - Hold home  lantus  Hypertension-was hypotensive, improved.   - Hold entresto and Carvedilol  CKD 3-stable, creatinine 1.29, at baseline. -Entresto on hold for now  COPD, asthma-stable. -Given albuterol inhaler  Hypothyroidism- TSH low at 0.019, she is on synthroid. - Hold synthroid,   - F/u as out patient  DVT prophylaxis: Eliquis Code Status: Full code Family Communication: Daughter [name] at bedside is Pharmacist, hospital Disposition Plan: Per rounding team Consults called: Cardiology Admission status: Inpatient, stepdown I certify that at the point of admission it is my clinical judgment that the patient will require inpatient hospital care spanning beyond 2 midnights from the point of admission due to high intensity of service, high risk for further deterioration and high frequency of surveillance required. The following factors support the patient status of inpatient: Atrial fibrillation with RVR with elevated troponin   Bethena Roys MD [facility]  07/30/2019, 6:55 PM

## 2019-07-30 NOTE — ED Provider Notes (Signed)
[facility] EMERGENCY DEPARTMENT Provider Note   CSN: [id number] Arrival date & time: 07/30/19  1206     History Chief Complaint  Patient presents with  . Hypotension    Debra [name] is a 86 y.o. female with history of A. fib, CHF, CKD, COPD, CAD, type 2 diabetes mellitus, thyroid disease, restless leg syndrome, hypertension, GERD, diabetic neuropathy presents for evaluation of acute onset, persistent shortness of breath upon awakening this morning at 9 AM.  Reports that she went to bed last night in her usual state of health but awoke this morning feeling short of breath, worsens with any activity.  Has also felt generally weak and has had some nausea and dry heaving as well.  She denies chest pain, fever, cough, or any known sick contacts.  No abdominal pain, diarrhea, constipation, or urinary symptoms.  She is currently anticoagulated on Eliquis and states she has been compliant with this medication but did not have any of her morning medicines.  Reportedly her blood pressure has been difficult to keep within normal limits so she was discontinued off of all of her blood pressure medications she thinks around 1 month ago.  She denies recent travel or surgeries, hemoptysis, prior history of DVT or PE.  No leg swelling.  On EMS arrival patient was found to be hypotensive with BPs in the 62I systolic with some improvement after IV fluid bolus.   She is DNR. The history is provided by the patient.       Past Medical History:  Diagnosis Date  . Arthritis   . CHF (congestive heart failure) ([place])   . CKD (chronic kidney disease) stage 4, GFR 15-29 ml/min (HCC)   . Colon cancer ([place])   . COPD (chronic obstructive pulmonary disease) ([place])   . Coronary artery disease    a. s/p CABG in 1997 (no detials provided) b. 12/2017: NSTEMI with cath showing patent LIMA-LAD with occluded SVG-OM and presumed occluded SVG-RCA but unable to be visualized, RCA was filled by collaterals. Medical management  recommended.   . DDD (degenerative disc disease), lumbar   . Diabetic neuropathy ([place])   . GERD (gastroesophageal reflux disease)   . Gout   . Hypertension   . Restless leg syndrome   . Thyroid disease   . Type 2 diabetes mellitus [facility])     Patient Active Problem List   Diagnosis Date Noted  . Cellulitis 02/22/2019  . Adjustment disorder with mixed anxiety and depressed mood 12/21/2018  . Benign essential HTN 02/07/2018  . Dizziness 02/07/2018  . Elevated troponin 02/07/2018  . Acute on chronic systolic CHF (congestive heart failure) ([place]) 12/30/2017  . Atrial fibrillation with RVR ([place]) 12/30/2017  . NSTEMI (non-ST elevated myocardial infarction) ([place]) 12/26/2017  . Palpitations 10/03/2017  . Allergic rhinitis 09/20/2017  . Asthma 09/20/2017  . Carpal tunnel syndrome 09/20/2017  . Diabetes with neurologic complications ([place]) 29/79/8921  . Generalized osteoarthritis of hand 09/20/2017  . GERD (gastroesophageal reflux disease) 09/20/2017  . Hiatal hernia 09/20/2017  . Positive ANA (antinuclear antibody) 09/20/2017  . Restless leg 09/20/2017  . Sciatica 09/20/2017  . General weakness 05/07/2017  . 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 UTI (urinary tract infection) 03/20/2017  . Hypertension 03/20/2017  . AKI (acute kidney injury) ([place]) 03/20/2017  . Chronic renal insufficiency, stage 3 (moderate) ([place]) 03/20/2017  . Rheumatoid arthritis ([place]) 03/20/2017  . Type 2 diabetes mellitus ([place]) 03/20/2017  . COPD (chronic obstructive pulmonary disease) ([place]) 06/03/2016  . Anxiety 05/09/2012  .  Colon cancer ([place]) 03/01/2011  . CAD (coronary artery disease) 07/25/2010  . Hyperlipidemia, unspecified 07/25/2010  . Hypothyroidism 07/25/2010  . PAD (peripheral artery disease) ([place]) 07/25/2010    Past Surgical History:  Procedure Laterality Date  . ABDOMINAL HYSTERECTOMY    . CHOLECYSTECTOMY    . COLON RESECTION    . CORONARY ARTERY BYPASS GRAFT    . ILIAC ARTERY STENT    . LEFT  HEART CATH AND CORS/GRAFTS ANGIOGRAPHY N/A 12/28/2017   Procedure: LEFT HEART CATH AND CORS/GRAFTS ANGIOGRAPHY;  Surgeon: Martinique, Peter M, MD;  Location: [place] [facility];  Service: Cardiovascular;  Laterality: N/A;  . LUNG SURGERY Left 2012   left lung resection  . RENAL ARTERY STENT    . THYROID SURGERY    . ULTRASOUND GUIDANCE FOR VASCULAR ACCESS  12/28/2017   Procedure: Ultrasound Guidance For Vascular Access;  Surgeon: Martinique, Peter M, MD;  Location: [place] [facility];  Service: Cardiovascular;;     OB History    Gravida  4   Para  4   Term  4   Preterm      AB      Living        SAB      TAB      Ectopic      Multiple      Live Births              Family History  Problem Relation Age of Onset  . Colon cancer Mother   . Heart disease Father   . Heart attack Sister   . Breast cancer Daughter     Social History   Tobacco Use  . Smoking status: Former Smoker    Quit date: 03/21/1996    Years since quitting: 23.3  . Smokeless tobacco: Never Used  Substance Use Topics  . Alcohol use: No  . Drug use: No    Home Medications Prior to Admission medications   Medication Sig Start Date End Date Taking? Authorizing Provider  acetaminophen (TYLENOL) 325 MG tablet Take 650 mg by mouth every 6 (six) hours as needed for mild pain.   Yes [provider]  albuterol (VENTOLIN HFA) 108 (90 Base) MCG/ACT inhaler Inhale 1 puff into the lungs every 6 (six) hours as needed for shortness of breath or wheezing.   Yes [provider]  apixaban (ELIQUIS) 2.5 MG TABS tablet Take 1 tablet (2.5 mg total) by mouth 2 (two) times daily. 07/20/18  Yes [name], MD  atorvastatin (LIPITOR) 80 MG tablet Take 1 tablet (80 mg total) by mouth daily at 6 PM. 12/31/17  Yes Strader, [place] M, PA-C  carvedilol (COREG) 3.125 MG tablet Take 3.125 mg by mouth 2 (two) times daily with a meal.   Yes [provider]  Cholecalciferol (VITAMIN D) 125 MCG (5000  UT) CAPS Take 1 capsule by mouth daily.   Yes [provider]  diazepam (VALIUM) 2 MG tablet Take 2 mg by mouth daily as needed. 06/21/19  Yes [provider]  gabapentin (NEURONTIN) 100 MG capsule Take 100 mg by mouth 3 (three) times daily.  03/09/18  Yes [provider]  HYDROcodone-acetaminophen (NORCO) 10-325 MG tablet Take 1 tablet by mouth every 6 (six) hours as needed for moderate pain or severe pain. 1 tab po hs/ and prn.   Yes [provider]  insulin glargine (LANTUS) 100 unit/mL SOPN Inject 0.1 mLs (10 Units total) into the skin at bedtime. 02/24/19  Yes Barton Dubois, MD  levothyroxine (SYNTHROID) 112 MCG tablet Take 1 tablet by mouth daily. 06/08/19  Yes [provider]  Lidocaine HCl-Benzyl Alcohol (SALONPAS LIDOCAINE PLUS) 4-10 % LIQD Apply 1 application topically as needed.   Yes [provider]  Menthol, Topical Analgesic, (ASPERCREME MAX ROLL-ON) 16 % LIQD Apply 1 application topically as needed.   Yes [provider]  Omega-3 Fatty Acids (FISH OIL) 1000 MG CAPS Take 1 tablet by mouth daily.   Yes [provider]  pantoprazole (PROTONIX) 40 MG tablet Take 40 mg by mouth daily.  10/11/18  Yes [provider]  rOPINIRole (REQUIP) 2 MG tablet Take 2 mg by mouth at bedtime.  09/03/18  Yes [provider]  sacubitril-valsartan (ENTRESTO) 24-26 MG Take 1 tablet by mouth 2 (two) times daily.  02/13/18  Yes [provider]  amoxicillin-clavulanate (AUGMENTIN) 875-125 MG tablet  04/03/19   [provider]    Allergies    Ciprofloxacin, Iodine, and Iodinated diagnostic agents  Review of Systems   Review of Systems  Constitutional: Negative for chills and fever.  Respiratory: Positive for shortness of breath. Negative for cough.   Cardiovascular: Positive for palpitations. Negative for chest pain and leg swelling.  Gastrointestinal: Positive for nausea. Negative for abdominal pain,  constipation, diarrhea and vomiting.  Genitourinary: Negative for dysuria and hematuria.  All other systems reviewed and are negative.   Physical Exam Updated Vital Signs BP (!) 100/48   Pulse 83   Temp 97.7 F (36.5 C)   Resp (!) 23   Ht 5\' 7"  (1.702 m)   Wt 54 kg   SpO2 100%   BMI 18.64 kg/m   Physical Exam Vitals and nursing note reviewed.  Constitutional:      General: She is not in acute distress.    Appearance: She is well-developed.  HENT:     Head: Normocephalic and atraumatic.  Eyes:     General:        Right eye: No discharge.        Left eye: No discharge.     Conjunctiva/sclera: Conjunctivae normal.  Neck:     Vascular: No JVD.     Trachea: No tracheal deviation.  Cardiovascular:     Rate and Rhythm: Tachycardia present. Rhythm irregular.     Pulses: Normal pulses.  Pulmonary:     Effort: Pulmonary effort is normal.  Abdominal:     General: Bowel sounds are normal. There is no distension.     Palpations: Abdomen is soft.     Tenderness: There is no guarding or rebound.  Musculoskeletal:     Cervical back: Neck supple.  Skin:    General: Skin is warm and dry.     Findings: No erythema.  Neurological:     Mental Status: She is alert.  Psychiatric:        Behavior: Behavior normal.     ED Results / Procedures / Treatments   Labs (all labs ordered are listed, but only abnormal results are displayed) Labs Reviewed  COMPREHENSIVE METABOLIC PANEL - Abnormal; Notable for the following components:      Result Value   Glucose, Bld 103 (*)    BUN 27 (*)    Creatinine, Ser 1.29 (*)    Calcium 8.7 (*)    Total Protein 5.5 (*)    Albumin 3.1 (*)    GFR calc non Af Amer 37 (*)    GFR calc Af Amer 43 (*)  All other components within normal limits  CBC WITH DIFFERENTIAL/PLATELET - Abnormal; Notable for the following components:   RBC 3.52 (*)    Hemoglobin 11.6 (*)    MCV 104.8 (*)    RDW 15.6 (*)    All other components within normal limits    TROPONIN I (HIGH SENSITIVITY) - Abnormal; Notable for the following components:   Troponin I (High Sensitivity) 170 (*)    All other components within normal limits  RESPIRATORY PANEL BY RT PCR (FLU A&B, COVID)  LIPASE, BLOOD  URINALYSIS, ROUTINE W REFLEX MICROSCOPIC  MAGNESIUM  TSH  POC SARS CORONAVIRUS 2 AG -  ED  TROPONIN I (HIGH SENSITIVITY)    EKG EKG Interpretation  Date/Time:  Monday July 30 2019 12:35:04 EST Ventricular Rate:  140 PR Interval:    QRS Duration: 79 QT Interval:  261 QTC Calculation: 399 R Axis:   89 Text Interpretation: Atrial fibrillation Borderline right axis deviation Repolarization abnormality, prob rate related ST changes likely rate related. No STEMI Confirmed by Nanda Quinton [phone]) on 07/30/2019 12:56:23 PM   Radiology DG Chest Portable 1 View  Result Date: 07/30/2019 CLINICAL DATA:  Generalized weakness with nausea and dry heaving. EXAM: PORTABLE CHEST 1 VIEW COMPARISON:  03/21/2019 FINDINGS: Lungs are hyperexpanded. Interstitial markings are diffusely coarsened with chronic features. Staple line noted right upper lung. Cardiopericardial silhouette is at upper limits of normal for size. The visualized bony structures of the thorax are intact. Telemetry leads overlie the chest. IMPRESSION: Cardiomegaly with emphysema.  No acute cardiopulmonary findings. Electronically Signed   By: Misty Stanley M.D.   On: 07/30/2019 12:56    Procedures .Critical Care Performed by: Renita Papa, PA-C Authorized by: Renita Papa, PA-C   Critical care provider statement:    Critical care time (minutes):  45   Critical care was necessary to treat or prevent imminent or life-threatening deterioration of the following conditions:  Circulatory failure and cardiac failure   Critical care was time spent personally by me on the following activities:  Discussions with consultants, evaluation of patient's response to treatment, examination of patient, ordering and  performing treatments and interventions, ordering and review of laboratory studies, ordering and review of radiographic studies, pulse oximetry, re-evaluation of patient's condition, obtaining history from patient or surrogate and review of old charts   (including critical care time)  Medications Ordered in ED Medications  diltiazem (CARDIZEM) 1 mg/mL load via infusion 5 mg (5 mg Intravenous Bolus from Bag 07/30/19 1335)    And  diltiazem (CARDIZEM) 125 mg in dextrose 5% 125 mL (1 mg/mL) infusion (10 mg/hr Intravenous Rate/Dose Change 07/30/19 1424)  etomidate (AMIDATE) injection 6 mg (6 mg Intravenous Given 07/30/19 1303)  etomidate (AMIDATE) injection (6 mg Intravenous Given 07/30/19 1304)    ED Course  I have reviewed the triage vital signs and the nursing notes.  Pertinent labs & imaging results that were available during my care of the patient were reviewed by me and considered in my medical decision making (see chart for details).    MDM Rules/Calculators/A&P                      Patient presenting for evaluation of sudden onset shortness of breath, found to be hypotensive with EMS with some improvement with IV fluid bolus on route to the ED.  She is afebrile, hypotensive with blood pressure around 70/50 on arrival, tachycardic and found to be in A. fib with RVR.  Due  to her hypotension and rapid rate she would likely benefit from electrical cardioversion as opposed to chemical cardioversion.  She has been compliant with her Eliquis.  Discussed risks of cardioversion and patient and daughter agree to move forward with procedure.  She does agree to CPR in the event of V. tach/V. fib, or cardiac arrest due to cardioversion.  She notes that she is otherwise DNR.  Patient was cardioverted twice, initially with 100 J and subsequently followed by 120 J.  Both times heart rate slowed to the 70s for a few seconds before returning to the 120s.  He was given a second IV fluid bolus with improvement  in her blood pressure up to 948 systolic so she was given a small diltiazem bolus and started on a diltiazem infusion.  She has had some improvement in heart rate and blood pressure.  Lab work reviewed by me shows no leukocytosis, mild stable anemia, mildly elevated BUN and creatinine above baseline.  No metabolic derangements.  Her initial troponin is elevated this is not unusual after cardioversion so we will continue to trend this.  Point-of-care Covid test is negative.  Her chest x-ray shows cardiomegaly with emphysema but no acute cardiopulmonary abnormalities.  Spoke with Dr. Denton Brick with [facility] hospitalist service who agrees to assume care of patient and bring her into the hospital for further evaluation and management.  Patient was seen and evaluated by Dr. Laverta Baltimore who agrees with assessment and plan at this time.   Final Clinical Impression(s) / ED Diagnoses Final diagnoses:  Atrial fibrillation with RVR (HCC)  Hypotension, unspecified hypotension type  Elevated troponin    Rx / DC Orders ED Discharge Orders    None       Debroah Baller 07/30/19 1623    Margette Fast, MD 07/31/19 [phone]

## 2019-07-30 NOTE — ED Notes (Signed)
Date and time results received: 07/30/19 1543  (use smartphrase ".now" to insert current time)  Test: troponin  Critical Value: 170  Name of Provider Notified: Dr Arlyce Dice  Orders Received? Or Actions Taken?: see new orders

## 2019-07-30 NOTE — Progress Notes (Addendum)
CRITICAL VALUE ALERT  Critical Value:  Troponin 7875  Date & Time Notied:  1/25 2129  Provider Notified: Denton Brick  Orders Received/Actions taken: awaiting orders

## 2019-07-30 NOTE — ED Notes (Signed)
Afib 78 after first shock.  Increased to 120 joules and shocked.

## 2019-07-30 NOTE — ED Notes (Signed)
Daughter, Rayburn Ma [phone] contact person.  Please keep informed.

## 2019-07-31 ENCOUNTER — Inpatient Hospital Stay (HOSPITAL_COMMUNITY): Payer: Medicare HMO

## 2019-07-31 ENCOUNTER — Encounter (HOSPITAL_COMMUNITY)
Admission: EM | Disposition: A | Discharge status: Home / Self Care | Payer: Self-pay | Source: Home / Self Care | Attending: Internal Medicine

## 2019-07-31 DIAGNOSIS — I959 Hypotension, unspecified: Secondary | ICD-10-CM

## 2019-07-31 DIAGNOSIS — I4891 Unspecified atrial fibrillation: Secondary | ICD-10-CM

## 2019-07-31 DIAGNOSIS — I214 Non-ST elevation (NSTEMI) myocardial infarction: Secondary | ICD-10-CM

## 2019-07-31 DIAGNOSIS — I340 Nonrheumatic mitral (valve) insufficiency: Secondary | ICD-10-CM

## 2019-07-31 DIAGNOSIS — N183 Chronic kidney disease, stage 3 unspecified: Secondary | ICD-10-CM

## 2019-07-31 LAB — CBC
HCT: 35.5 % — ABNORMAL LOW (ref 36.0–46.0)
Hemoglobin: 10.7 g/dL — ABNORMAL LOW (ref 12.0–15.0)
MCH: 33.2 pg (ref 26.0–34.0)
MCHC: 30.1 g/dL (ref 30.0–36.0)
MCV: 110.2 fL — ABNORMAL HIGH (ref 80.0–100.0)
Platelets: 215 10*3/uL (ref 150–400)
RBC: 3.22 MIL/uL — ABNORMAL LOW (ref 3.87–5.11)
RDW: 15.6 % — ABNORMAL HIGH (ref 11.5–15.5)
WBC: 6.1 10*3/uL (ref 4.0–10.5)
nRBC: 0 % (ref 0.0–0.2)

## 2019-07-31 LAB — TROPONIN I (HIGH SENSITIVITY)
Troponin I (High Sensitivity): 10235 ng/L (ref <18)
Troponin I (High Sensitivity): 10828 ng/L (ref <18)
Troponin I (High Sensitivity): 8434 ng/L (ref <18)

## 2019-07-31 LAB — BASIC METABOLIC PANEL
Anion gap: 5 (ref 5–15)
BUN: 28 mg/dL — ABNORMAL HIGH (ref 8–23)
CO2: 23 mmol/L (ref 22–32)
Calcium: 8.5 mg/dL — ABNORMAL LOW (ref 8.9–10.3)
Chloride: 111 mmol/L (ref 98–111)
Creatinine, Ser: 1.27 mg/dL — ABNORMAL HIGH (ref 0.44–1.00)
GFR calc Af Amer: 44 mL/min — ABNORMAL LOW (ref >60)
GFR calc non Af Amer: 38 mL/min — ABNORMAL LOW (ref >60)
Glucose, Bld: 139 mg/dL — ABNORMAL HIGH (ref 70–99)
Potassium: 4.8 mmol/L (ref 3.5–5.1)
Sodium: 139 mmol/L (ref 135–145)

## 2019-07-31 LAB — HEMOGLOBIN A1C
Hgb A1c MFr Bld: 6.3 % — ABNORMAL HIGH (ref 4.8–5.6)
Mean Plasma Glucose: 134.11 mg/dL

## 2019-07-31 LAB — APTT
aPTT: 32 seconds (ref 24–36)
aPTT: 31 seconds (ref 24–36)

## 2019-07-31 LAB — PROTIME-INR
INR: 1.1 (ref 0.8–1.2)
Prothrombin Time: 14.4 seconds (ref 11.4–15.2)

## 2019-07-31 LAB — GLUCOSE, CAPILLARY
Glucose-Capillary: 136 mg/dL — ABNORMAL HIGH (ref 70–99)
Glucose-Capillary: 106 mg/dL — ABNORMAL HIGH (ref 70–99)
Glucose-Capillary: 105 mg/dL — ABNORMAL HIGH (ref 70–99)
Glucose-Capillary: 113 mg/dL — ABNORMAL HIGH (ref 70–99)
Glucose-Capillary: 129 mg/dL — ABNORMAL HIGH (ref 70–99)
Glucose-Capillary: 194 mg/dL — ABNORMAL HIGH (ref 70–99)
Glucose-Capillary: 161 mg/dL — ABNORMAL HIGH (ref 70–99)

## 2019-07-31 LAB — ECHOCARDIOGRAM COMPLETE
Height: 67 in
Weight: 1907.2 oz

## 2019-07-31 LAB — C DIFFICILE QUICK SCREEN W PCR REFLEX
C Diff antigen: NEGATIVE
C Diff interpretation: NOT DETECTED
C Diff toxin: NEGATIVE

## 2019-07-31 LAB — HEPARIN LEVEL (UNFRACTIONATED): Heparin Unfractionated: 1.87 IU/mL — ABNORMAL HIGH (ref 0.30–0.70)

## 2019-07-31 LAB — T4, FREE: Free T4: 2.92 ng/dL — ABNORMAL HIGH (ref 0.61–1.12)

## 2019-07-31 SURGERY — LEFT HEART CATH AND CORS/GRAFTS ANGIOGRAPHY
Anesthesia: LOCAL

## 2019-07-31 MED ORDER — ASPIRIN 81 MG PO CHEW: 81.0000 mg | CHEWABLE_TABLET | ORAL | Status: DC

## 2019-07-31 MED ORDER — AMIODARONE HCL 200 MG PO TABS
200.0000 mg | ORAL_TABLET | Freq: Every day | ORAL | Status: DC
  Administered 2019-07-31 – 2019-08-02 (×3): 200 mg via ORAL
  Filled 2019-07-31 (×3): qty 1

## 2019-07-31 MED ORDER — SODIUM CHLORIDE 0.9% FLUSH: 3.0000 mL | INTRAVENOUS | Status: DC | PRN

## 2019-07-31 MED ORDER — HEPARIN (PORCINE) 25000 UT/250ML-% IV SOLN
850.0000 [IU]/h | INTRAVENOUS | Status: DC
  Administered 2019-07-31 (×2): 750 [IU]/h via INTRAVENOUS
  Filled 2019-07-31: qty 250

## 2019-07-31 MED ORDER — LEVOTHYROXINE SODIUM 50 MCG PO TABS
50.0000 ug | ORAL_TABLET | Freq: Every day | ORAL | Status: DC
  Administered 2019-08-01 – 2019-08-02 (×2): 50 ug via ORAL
  Filled 2019-07-31 (×2): qty 1

## 2019-07-31 MED ORDER — DIPHENHYDRAMINE HCL 50 MG/ML IJ SOLN: 50.0000 mg | Freq: Once | INTRAMUSCULAR | Status: DC

## 2019-07-31 MED ORDER — SODIUM CHLORIDE 0.9 % IV SOLN: INTRAVENOUS | Status: DC

## 2019-07-31 MED ORDER — DIAZEPAM 2 MG PO TABS
2.0000 mg | ORAL_TABLET | Freq: Once | ORAL | Status: AC
  Administered 2019-07-31: 2 mg via ORAL
  Filled 2019-07-31: qty 1

## 2019-07-31 MED ORDER — PREDNISONE 20 MG PO TABS
50.0000 mg | ORAL_TABLET | Freq: Four times a day (QID) | ORAL | Status: AC
  Administered 2019-07-31 – 2019-08-01 (×3): 50 mg via ORAL
  Filled 2019-07-31 (×3): qty 1

## 2019-07-31 MED ORDER — SODIUM CHLORIDE 0.9 % IV SOLN: 250.0000 mL | INTRAVENOUS | Status: DC | PRN

## 2019-07-31 MED ORDER — DIPHENHYDRAMINE HCL 25 MG PO CAPS: 50.0000 mg | ORAL_CAPSULE | Freq: Once | ORAL | Status: DC

## 2019-07-31 MED ORDER — ASPIRIN 81 MG PO CHEW
81.0000 mg | CHEWABLE_TABLET | ORAL | Status: AC
  Administered 2019-08-01: 81 mg via ORAL
  Filled 2019-07-31: qty 1

## 2019-07-31 MED ORDER — SODIUM CHLORIDE 0.9% FLUSH
3.0000 mL | Freq: Two times a day (BID) | INTRAVENOUS | Status: DC
  Administered 2019-07-31: 3 mL via INTRAVENOUS

## 2019-07-31 MED ORDER — ADULT MULTIVITAMIN W/MINERALS CH
1.0000 | ORAL_TABLET | Freq: Every day | ORAL | Status: DC
  Administered 2019-07-31 – 2019-08-02 (×3): 1 via ORAL
  Filled 2019-07-31 (×3): qty 1

## 2019-07-31 MED ORDER — ENSURE ENLIVE PO LIQD
237.0000 mL | Freq: Two times a day (BID) | ORAL | Status: DC
  Administered 2019-07-31 – 2019-08-01 (×2): 237 mL via ORAL

## 2019-07-31 NOTE — Progress Notes (Signed)
Initial Nutrition Assessment  DOCUMENTATION CODES:   Underweight  INTERVENTION:   -Ensure Enlive po BID, each supplement provides 350 kcal and 20 grams of protein -Multivitamin with minerals daily  NUTRITION DIAGNOSIS:   Increased nutrient needs related to chronic illness as evidenced by estimated needs.  GOAL:   Patient will meet greater than or equal to 90% of their needs  MONITOR:   PO intake, Supplement acceptance, Labs, Weight trends, I & O's  REASON FOR ASSESSMENT:   Malnutrition Screening Tool    ASSESSMENT:   86 y.o. female with medical history significant for hypertension, diabetes mellitus, rheumatoid arthritis, CKD 3, systolic heart failure, asthma and COPD, luetschers syndrome.Patient presented to the ED with complaints of difficulty breathing when she woke up at about 9 AM.  Difficulty breathing is worse with activity.  She also reports generalized weakness nausea and reports 1 episode of vomiting small amounts 4 days ago and again today.  **RD working remotely**  Patient began having N/V PTA. Currently still with nausea, did consume ~50% of lunch today.  Per chart review, pt scheduled for left heart cath tomorrow 1/27. Will be NPO after midnight tonight.  Given poor PO, will order Ensure supplements and daily MVI.  Per weight records, pt has lost 16 lbs since July 2020 (11% wt loss x 7 months, insignificant for time frame).  Pt reports ~60 lbs of weight loss over the last year. Pt has had this amount of weight loss but per weight records this weight loss began in 2018.   Medications: IV Zofran  Labs reviewed: CBGs: 113-129   NUTRITION - FOCUSED PHYSICAL EXAM:  Working remotely.  Diet Order:   Diet Order            Diet NPO time specified Except for: Sips with Meds  Diet effective midnight        Diet heart healthy/carb modified Room service appropriate? Yes; Fluid consistency: Thin  Diet effective now              EDUCATION NEEDS:   No  education needs have been identified at this time  Skin:  Skin Assessment: Reviewed RN Assessment  Last BM:  1/25  Height:   Ht Readings from Last 1 Encounters:  07/31/19 5\' 7"  (1.702 m)    Weight:   Wt Readings from Last 1 Encounters:  07/31/19 54.1 kg    Ideal Body Weight:  61.3 kg  BMI:  Body mass index is 18.67 kg/m.  Estimated Nutritional Needs:   Kcal:  1400-1600  Protein:  65-80g  Fluid:  1.6L/day  Clayton Bibles, MS, RD, LDN Inpatient Clinical Dietitian Pager: [phone] After Hours Pager: [phone]

## 2019-07-31 NOTE — Progress Notes (Signed)
ANTICOAGULATION CONSULT NOTE - Initial Consult  Pharmacy Consult for heparin Indication: chest pain/ACS  Allergies  Allergen Reactions  . Ciprofloxacin Rash and Itching  . Iodine   . Iodinated Diagnostic Agents Rash    Patient Measurements: Height: 5\' 7"  (170.2 cm) Weight: 119 lb 7.8 oz (54.2 kg) IBW/kg (Calculated) : 61.6 Heparin Dosing Weight: 54.2 kg  Vital Signs: Temp: 98 F (36.7 C) (01/25 2109) Temp Source: Oral (01/25 2109) BP: 116/67 (01/26 0000) Pulse Rate: 66 (01/26 0000)  Labs: Recent Labs    07/30/19 1343 07/30/19 1347 07/30/19 1514 07/30/19 2003 07/30/19 2347  HGB 11.6*  --   --   --   --   HCT 36.9  --   --   --   --   PLT 222  --   --   --   --   CREATININE 1.29*  --   --   --   --   TROPONINIHS  --    < > 1,268* 7,875* 10,235*   < > = values in this interval not displayed.    Estimated Creatinine Clearance: 25.8 mL/min (A) (by C-G formula based on SCr of 1.29 mg/dL (H)).   Medical History: Past Medical History:  Diagnosis Date  . Arthritis   . CHF (congestive heart failure) ([place])   . CKD (chronic kidney disease) stage 4, GFR 15-29 ml/min (HCC)   . Colon cancer ([place])   . COPD (chronic obstructive pulmonary disease) ([place])   . Coronary artery disease    a. s/p CABG in 1997 (no detials provided) b. 12/2017: NSTEMI with cath showing patent LIMA-LAD with occluded SVG-OM and presumed occluded SVG-RCA but unable to be visualized, RCA was filled by collaterals. Medical management recommended.   . DDD (degenerative disc disease), lumbar   . Diabetic neuropathy ([place])   . GERD (gastroesophageal reflux disease)   . Gout   . Hypertension   . Restless leg syndrome   . Thyroid disease   . Type 2 diabetes mellitus (HCC)     Medications:  eliquis 2.5 mg po bid  Assessment: 86 yo lady to start heparin for CP.  Last dose eliquis ~ 2100 on 1/25   Goal of Therapy:  Heparin level 0.3-0.7 units/ml aPTT 66-102 seconds Monitor platelets by anticoagulation  protocol: Yes   Plan:  Start heparin drip ~ 12 hours after last dose eliquis at 750 units/hr Check baseline aPTT/HL with am labs  Excell Seltzer P[place]et 07/31/2019,2:02 AM

## 2019-07-31 NOTE — Progress Notes (Addendum)
PROGRESS NOTE                                                                                                                                                                                                             Patient Demographics:    Debra [name], is a 86 y.o. female, DOB - [birth year], ZMO:[id number]  Admit date - 07/30/2019   Admitting Physician Ejiroghene Arlyce Dice, MD  Outpatient Primary MD for the patient is Sharilyn Sites, MD  LOS - 1   Chief Complaint  Patient presents with  . Hypotension       Brief Narrative    86 y.o. female with medical history significant for hypertension, diabetes mellitus, rheumatoid arthritis, CKD 3, systolic heart failure, asthma and COPD, luetschers syndrome.CAD s/p CABG and prior stent of LCx. History of paroxysmal AFib on Eliquis.  She presents to [facility], ED secondary to severe shortness of breath, she was noted to be in A. fib, with heart rate in the 150s, with low blood pressure, cardioversion attempted x2, which was unsuccessful, at did slow the heart rate, EKG noted with ST depression, and significantly elevated troponins, tarted on heparin drip for NSTEMI, transferred to [facility] for further management.   Subjective:    [name] today denies any chest pain, reports dyspnea significantly improved, reports diarrhea x2 .   Assessment  & Plan :    Active Problems:   Hypertension   Chronic renal insufficiency, stage 3 (moderate) (HCC)   Type 2 diabetes mellitus (HCC)   Non-ST elevation (NSTEMI) myocardial infarction [facility])   Atrial fibrillation with RVR (HCC)   Hypotension  NSTEMI -Patient has significantly elevated troponin, and ST changes, with known history of CAD. -Cardiology input greatly appreciated, plan for cardiac cath tomorrow, left femoral or left radial approach. -Continue with heparin GTT, statin and beta-blockers  A. fib with RVR -Cardioversion x2 in ED, unsuccessful, on  amiodarone drip, converted to NSR. -Management per cardiology, continue with beta-blockers. -Eliquis on hold, continue with heparin gtt.   Chronic systolic CHF -currently appears euvolemic, but echo from 2019 shows EF of 35 to 40% and 2020 shows EF of 40 to 45%.  Not on Lasix.  -Entresto, on hold for now, consider resuming in a.m. with improved blood pressure. - Low-dose carvedilol 3.125 mg on hold for now while started on  metoprolol  Weight loss -Daughter reports history of colon cancer, I have discussed with the daughter, patient need to follow-up with GI as an outpatient for further work-up, she will arrange for GI follow-up for her in [place]  Diarrhea -We will check C. difficile  Diabetes mellitus-random glucose 103. - Hgba1c - SSI - S - Hold home lantus  Hypertension-was hypotensive, improved.   - Hold entresto and Carvedilol  CKD 3-stable, creatinine 1.29, at baseline. -Entresto on hold for now  COPD, asthma-stable. -Given albuterol inhaler  Hypothyroidism - TSH low at 0.019, she is on synthroid. -We will decrease her Synthroid from 168mcg to 75 mcg   COVID-19 Labs  No results for input(s): DDIMER, FERRITIN, LDH, CRP in the last 72 hours.  Lab Results  Component Value Date   [place] NEGATIVE 07/30/2019   [place] NEGATIVE 02/22/2019     Code Status : Full  Family Communication  : D/W daughter via phone  Disposition Plan  : home when stable  Barriers For Discharge : Heparin GTT, plan for cardiac cath in a.m.  Consults  : Cardiology  Procedures  : None  DVT Prophylaxis  : Heparin GTT  Lab Results  Component Value Date   PLT 215 07/31/2019    Antibiotics  :    Anti-infectives (From admission, onward)   None        Objective:   Vitals:   07/31/19 0400 07/31/19 0557 07/31/19 0625 07/31/19 1216  BP: (!) 102/56 138/71  127/66  Pulse: 61 65 65 68  Resp: 16     Temp:  97.6 F (36.4 C)    TempSrc:  Oral    SpO2: 97% 99%      Weight:  54.1 kg    Height:  5\' 7"  (1.702 m)      Wt Readings from Last 3 Encounters:  07/31/19 54.1 kg  03/21/19 54 kg  02/24/19 61 kg     Intake/Output Summary (Last 24 hours) at 07/31/2019 1438 Last data filed at 07/31/2019 1300 Gross per 24 hour  Intake 442.5 ml  Output 200 ml  Net 242.5 ml     Physical Exam  Awake Alert, Oriented X 3, No new F.N deficits, Normal affect Symmetrical Chest wall movement, Good air movement bilaterally, CTAB RRR,No Gallops,Rubs or new Murmurs, No Parasternal Heave +ve B.Sounds, Abd Soft, No tenderness,  No rebound - guarding or rigidity. No Cyanosis, Clubbing or edema, No new Rash or bruise      Data Review:    CBC Recent Labs  Lab 07/30/19 1343 07/31/19 0207  WBC 7.5 6.1  HGB 11.6* 10.7*  HCT 36.9 35.5*  PLT 222 215  MCV 104.8* 110.2*  MCH 33.0 33.2  MCHC 31.4 30.1  RDW 15.6* 15.6*  LYMPHSABS 0.8  --   MONOABS 0.5  --   EOSABS 0.1  --   BASOSABS 0.0  --     Chemistries  Recent Labs  Lab 07/30/19 1343 07/31/19 0207  NA 138 139  K 3.6 4.8  CL 107 111  CO2 23 23  GLUCOSE 103* 139*  BUN 27* 28*  CREATININE 1.29* 1.27*  CALCIUM 8.7* 8.5*  MG 1.7  --   AST 17  --   ALT 13  --   ALKPHOS 60  --   BILITOT 0.8  --    ------------------------------------------------------------------------------------------------------------------ No results for input(s): CHOL, HDL, LDLCALC, TRIG, CHOLHDL, LDLDIRECT in the last 72 hours.  Lab Results  Component Value Date   HGBA1C 6.3 (H)  07/30/2019   ------------------------------------------------------------------------------------------------------------------ Recent Labs    07/30/19 1343  TSH 0.019*   ------------------------------------------------------------------------------------------------------------------ No results for input(s): VITAMINB12, FOLATE, FERRITIN, TIBC, IRON, RETICCTPCT in the last 72 hours.  Coagulation profile Recent Labs  Lab 07/31/19 0622   INR 1.1    No results for input(s): DDIMER in the last 72 hours.  Cardiac Enzymes No results for input(s): CKMB, TROPONINI, MYOGLOBIN in the last 168 hours.  Invalid input(s): CK ------------------------------------------------------------------------------------------------------------------    Component Value Date/Time   BNP 1,003.0 (H) 12/26/2017 0845    Inpatient Medications  Scheduled Meds: . amiodarone  200 mg Oral Daily  . [START ON 08/01/2019] aspirin  81 mg Oral Pre-Cath  . atorvastatin  80 mg Oral q1800  . [START ON 08/01/2019] diphenhydrAMINE  50 mg Oral Once   Or  . [START ON 08/01/2019] diphenhydrAMINE  50 mg Intravenous Once  . feeding supplement (ENSURE ENLIVE)  237 mL Oral BID BM  . gabapentin  100 mg Oral TID  . insulin aspart  0-5 Units Subcutaneous QHS  . insulin aspart  0-9 Units Subcutaneous TID WC  . metoprolol tartrate  25 mg Oral Q6H  . multivitamin with minerals  1 tablet Oral Daily  . pantoprazole  40 mg Oral Daily  . predniSONE  50 mg Oral Q6H  . sodium chloride flush  3 mL Intravenous Q12H   Continuous Infusions: . sodium chloride    . [START ON 08/01/2019] sodium chloride    . heparin 750 Units/hr (07/31/19 0903)   PRN Meds:.sodium chloride, acetaminophen **OR** acetaminophen, albuterol, diazepam, ondansetron **OR** ondansetron (ZOFRAN) IV, polyethylene glycol, sodium chloride flush  Micro Results Recent Results (from the past 240 hour(s))  Respiratory Panel by RT PCR (Flu A&B, Covid) - Nasopharyngeal Swab     Status: None   Collection Time: 07/30/19  3:57 PM   Specimen: Nasopharyngeal Swab  Result Value Ref Range Status   SARS Coronavirus 2 by RT PCR NEGATIVE NEGATIVE Final    Comment: (NOTE) SARS-CoV-2 target nucleic acids are NOT DETECTED. The SARS-CoV-2 RNA is generally detectable in upper respiratoy specimens during the acute phase of infection. The lowest concentration of SARS-CoV-2 viral copies this assay can detect is 131  copies/mL. A negative result does not preclude SARS-Cov-2 infection and should not be used as the sole basis for treatment or other patient management decisions. A negative result may occur with  improper specimen collection/handling, submission of specimen other than nasopharyngeal swab, presence of viral mutation(s) within the areas targeted by this assay, and inadequate number of viral copies (<131 copies/mL). A negative result must be combined with clinical observations, patient history, and epidemiological information. The expected result is Negative. Fact Sheet for Patients:  PinkCheek.be Fact Sheet for Healthcare Providers:  GravelBags.it This test is not yet ap proved or cleared by the [place] FDA and  has been authorized for detection and/or diagnosis of SARS-CoV-2 by FDA under an Emergency Use Authorization (EUA). This EUA will remain  in effect (meaning this test can be used) for the duration of the COVID-19 declaration under Section 564(b)(1) of the Act, 21 U.S.C. section 360bbb-3(b)(1), unless the authorization is terminated or revoked sooner.    Influenza A by PCR NEGATIVE NEGATIVE Final   Influenza B by PCR NEGATIVE NEGATIVE Final    Comment: (NOTE) The Xpert Xpress SARS-CoV-2/FLU/RSV assay is intended as an aid in  the diagnosis of influenza from Nasopharyngeal swab specimens and  should not be used as a sole basis for treatment. Nasal  washings and  aspirates are unacceptable for Xpert Xpress SARS-CoV-2/FLU/RSV  testing. Fact Sheet for Patients: PinkCheek.be Fact Sheet for Healthcare Providers: GravelBags.it This test is not yet approved or cleared by the [place] FDA and  has been authorized for detection and/or diagnosis of SARS-CoV-2 by  FDA under an Emergency Use Authorization (EUA). This EUA will remain  in effect (meaning this test can  be used) for the duration of the  Covid-19 declaration under Section 564(b)(1) of the Act, 21  U.S.C. section 360bbb-3(b)(1), unless the authorization is  terminated or revoked. Performed at [facility], [address], [place], [place] [address]   MRSA PCR Screening     Status: None   Collection Time: 07/30/19  8:09 PM   Specimen: Nasal Mucosa; Nasopharyngeal  Result Value Ref Range Status   MRSA by PCR NEGATIVE NEGATIVE Final    Comment:        The GeneXpert MRSA Assay (FDA approved for NASAL specimens only), is one component of a comprehensive MRSA colonization surveillance program. It is not intended to diagnose MRSA infection nor to guide or monitor treatment for MRSA infections. Performed at [facility], [address], [place], [place] [address]     Radiology Reports DG Chest Portable 1 View  Result Date: 07/30/2019 CLINICAL DATA:  Generalized weakness with nausea and dry heaving. EXAM: PORTABLE CHEST 1 VIEW COMPARISON:  03/21/2019 FINDINGS: Lungs are hyperexpanded. Interstitial markings are diffusely coarsened with chronic features. Staple line noted right upper lung. Cardiopericardial silhouette is at upper limits of normal for size. The visualized bony structures of the thorax are intact. Telemetry leads overlie the chest. IMPRESSION: Cardiomegaly with emphysema.  No acute cardiopulmonary findings. Electronically Signed   By: Misty Stanley M.D.   On: 07/30/2019 12:56     Phillips Climes M.D on 07/31/2019 at 2:38 PM  Between 7am to 7pm - Pager - [phone]  After 7pm go to [email or web address] - password [facility]  [facility] -  Office  [phone]

## 2019-07-31 NOTE — Progress Notes (Signed)
Patients daughter, [name], called and updated on patient's plan of care.

## 2019-07-31 NOTE — Progress Notes (Signed)
CRITICAL VALUE ALERT  Critical Value:  Troponin 10235  Date & Time Notied:  1/26 0100  Provider Notified: Olevia Bowens  Orders Received/Actions taken: pending

## 2019-07-31 NOTE — Progress Notes (Addendum)
Spoke with patient's daughter regarding pending transfer to [facility]. Advised patient daughter that I would let her know as soon as her room was assigned. She is aware of visitor policy as well.

## 2019-07-31 NOTE — Progress Notes (Signed)
  Echocardiogram 2D Echocardiogram has been performed.  Debra [name] A Debra [name] 07/31/2019, 2:24 PM

## 2019-07-31 NOTE — Progress Notes (Signed)
Pt complaining of nausea. No complaints of pain, no shortness of breath. Administered IV zofran.

## 2019-07-31 NOTE — Consult Note (Addendum)
Cardiology Consultation:   Patient ID: ZYAIRE DUMAS MRN: [id number]; DOB: [birth year]  Admit date: 07/30/2019 Date of Consult: 07/31/2019  Primary Care Provider: Sharilyn Sites, MD Primary Cardiologist: Loralie Champagne, MD  Primary Electrophysiologist:  None    Patient Profile:   Debra [name] is a 86 y.o. female with a hx of CAD s/p CABG with patent LIMA-LAD and collaterals, chronic systolic heart failure, ischemic cardiomyopathy, paroxysmal atrial fibrillation on eliquis, HTN, CKD stage IV, DM2, COPD, PAD, Luetscher syndrome, and HLD who is being seen today for the evaluation of NSTEMI at the request of Dr. Denton Brick.  History of Present Illness:   Debra [name] was last hospitalized 12/2017 with progressive dyspnea, Afib RVR, and CHF exacerbation. She has known CAD with last heart cath 12/28/17 that showed three vessel disease with 100% occlusion of proximal LAD, 90% smal OM3, and 100% proximal RCA with left to right collaterals. Patent LIMA-LAD, occluded SVG to OM, no graft visualized to RCA but fills by collaterals. She also had moderately elevated LVEDP at that time. No intervention, medical therapy recommended. She saw Dr. Domenic Polite 01/09/19 . Echo 07/21/18 with improved EF to 40-45% and wall motion abnormalities consistent with ischemic cardiomyopathy.  At that time, she was maintained on 2.5 mg eliquis BID, lipitor 80 mg, coreg 3.125 mg BID, and entresto 24-26 mg BID.   She presented to [facility] with complaints of SOB upon wakening on 07/30/19. She reports DOE and gneralized weakness and nausea. On arrival, she was tachycardic in the 150s with atrial fibrillation. She was also hypotensive in the 57/43 range. She underwent DCCV x 2 in the ER without successful conversion to NSR. Cardizem gtt started with improvement in HR and BP. Cardiology was consulted for NSTEMI.   On my interview, she reports feeling SOB at rest yesterday, which was new for her. She is otherwise unaware of her rhythm. No bleeding  problems. She has had a 60 lb weight loss in the last year that corresponds with her daughter dying of a MI in October 2020. The most strenuous activity she does is walk to the mailbox daily. She reports DOE with this activity over the past 2 months. She continues to deny chest pain and does not recall her symptoms prior to her last heart cath.    Heart Pathway Score:     Past Medical History:  Diagnosis Date  . Arthritis   . CHF (congestive heart failure) ([place])   . CKD (chronic kidney disease) stage 4, GFR 15-29 ml/min (HCC)   . Colon cancer ([place])   . COPD (chronic obstructive pulmonary disease) ([place])   . Coronary artery disease    a. s/p CABG in 1997 (no detials provided) b. 12/2017: NSTEMI with cath showing patent LIMA-LAD with occluded SVG-OM and presumed occluded SVG-RCA but unable to be visualized, RCA was filled by collaterals. Medical management recommended.   . DDD (degenerative disc disease), lumbar   . Diabetic neuropathy ([place])   . GERD (gastroesophageal reflux disease)   . Gout   . Hypertension   . Restless leg syndrome   . Thyroid disease   . Type 2 diabetes mellitus ([place])     Past Surgical History:  Procedure Laterality Date  . ABDOMINAL HYSTERECTOMY    . CHOLECYSTECTOMY    . COLON RESECTION    . CORONARY ARTERY BYPASS GRAFT    . ILIAC ARTERY STENT    . LEFT HEART CATH AND CORS/GRAFTS ANGIOGRAPHY N/A 12/28/2017   Procedure: LEFT HEART CATH AND CORS/GRAFTS  ANGIOGRAPHY;  Surgeon: Martinique, Peter M, MD;  Location: [facility];  Service: Cardiovascular;  Laterality: N/A;  . LUNG SURGERY Left 2012   left lung resection  . RENAL ARTERY STENT    . THYROID SURGERY    . ULTRASOUND GUIDANCE FOR VASCULAR ACCESS  12/28/2017   Procedure: Ultrasound Guidance For Vascular Access;  Surgeon: Martinique, Peter M, MD;  Location: [facility];  Service: Cardiovascular;;     Home Medications:  Prior to Admission medications   Medication Sig Start Date End Date Taking?  Authorizing Provider  acetaminophen (TYLENOL) 325 MG tablet Take 650 mg by mouth every 6 (six) hours as needed for mild pain.   Yes [provider]  albuterol (VENTOLIN HFA) 108 (90 Base) MCG/ACT inhaler Inhale 1 puff into the lungs every 6 (six) hours as needed for shortness of breath or wheezing.   Yes [provider]  apixaban (ELIQUIS) 2.5 MG TABS tablet Take 1 tablet (2.5 mg total) by mouth 2 (two) times daily. 07/20/18  Yes [name], MD  atorvastatin (LIPITOR) 80 MG tablet Take 1 tablet (80 mg total) by mouth daily at 6 PM. 12/31/17  Yes Strader, [place] M, PA-C  carvedilol (COREG) 3.125 MG tablet Take 3.125 mg by mouth 2 (two) times daily with a meal.   Yes [provider]  Cholecalciferol (VITAMIN D) 125 MCG (5000 UT) CAPS Take 1 capsule by mouth daily.   Yes [provider]  diazepam (VALIUM) 2 MG tablet Take 2 mg by mouth daily as needed. 06/21/19  Yes [provider]  gabapentin (NEURONTIN) 100 MG capsule Take 100 mg by mouth 3 (three) times daily.  03/09/18  Yes [provider]  HYDROcodone-acetaminophen (NORCO) 10-325 MG tablet Take 1 tablet by mouth every 6 (six) hours as needed for moderate pain or severe pain. 1 tab po hs/ and prn.   Yes [provider]  insulin glargine (LANTUS) 100 unit/mL SOPN Inject 0.1 mLs (10 Units total) into the skin at bedtime. 02/24/19  Yes Barton Dubois, MD  levothyroxine (SYNTHROID) 112 MCG tablet Take 1 tablet by mouth daily. 06/08/19  Yes [provider]  Lidocaine HCl-Benzyl Alcohol (SALONPAS LIDOCAINE PLUS) 4-10 % LIQD Apply 1 application topically as needed.   Yes [provider]  Menthol, Topical Analgesic, (ASPERCREME MAX ROLL-ON) 16 % LIQD Apply 1 application topically as needed.   Yes [provider]  Omega-3 Fatty Acids (FISH OIL) 1000 MG CAPS Take 1 tablet by mouth daily.   Yes [provider]  pantoprazole (PROTONIX) 40 MG tablet Take 40 mg  by mouth daily.  10/11/18  Yes [provider]  rOPINIRole (REQUIP) 2 MG tablet Take 2 mg by mouth at bedtime.  09/03/18  Yes [provider]  sacubitril-valsartan (ENTRESTO) 24-26 MG Take 1 tablet by mouth 2 (two) times daily.  02/13/18  Yes [provider]    Inpatient Medications: Scheduled Meds: . atorvastatin  80 mg Oral q1800  . gabapentin  100 mg Oral TID  . insulin aspart  0-5 Units Subcutaneous QHS  . insulin aspart  0-9 Units Subcutaneous TID WC  . metoprolol tartrate  25 mg Oral Q6H  . pantoprazole  40 mg Oral Daily   Continuous Infusions: . heparin 750 Units/hr (07/31/19 0903)   PRN Meds: acetaminophen **OR** acetaminophen, albuterol, diazepam, ondansetron **OR** ondansetron (ZOFRAN) IV, polyethylene glycol  Allergies:    Allergies  Allergen Reactions  . Ciprofloxacin Rash and Itching  . Iodine   .  Iodinated Diagnostic Agents Rash    Social History:   Social History   Socioeconomic History  . Marital status: Widowed    Spouse name: Not on file  . Number of children: Not on file  . Years of education: Not on file  . Highest education level: Not on file  Occupational History  . Not on file  Tobacco Use  . Smoking status: Former Smoker    Quit date: 03/21/1996    Years since quitting: 23.3  . Smokeless tobacco: Never Used  Substance and Sexual Activity  . Alcohol use: No  . Drug use: No  . Sexual activity: Not on file  Other Topics Concern  . Not on file  Social History Narrative  . Not on file   Social Determinants of Health   Financial Resource Strain:   . Difficulty of Paying Living Expenses: Not on file  Food Insecurity:   . Worried About Charity fundraiser in the Last Year: Not on file  . Ran Out of Food in the Last Year: Not on file  Transportation Needs:   . Lack of Transportation (Medical): Not on file  . Lack of Transportation (Non-Medical): Not on file  Physical Activity:   . Days of Exercise per Week: Not on  file  . Minutes of Exercise per Session: Not on file  Stress:   . Feeling of Stress : Not on file  Social Connections:   . Frequency of Communication with Friends and Family: Not on file  . Frequency of Social Gatherings with Friends and Family: Not on file  . Attends Religious Services: Not on file  . Active Member of Clubs or Organizations: Not on file  . Attends Archivist Meetings: Not on file  . Marital Status: Not on file  Intimate Partner Violence:   . Fear of Current or Ex-Partner: Not on file  . Emotionally Abused: Not on file  . Physically Abused: Not on file  . Sexually Abused: Not on file    Family History:    Family History  Problem Relation Age of Onset  . Colon cancer Mother   . Heart disease Father   . Heart attack Sister   . Breast cancer Daughter      ROS:  Please see the history of present illness.   All other ROS reviewed and negative.     Physical Exam/Data:   Vitals:   07/31/19 0300 07/31/19 0400 07/31/19 0557 07/31/19 0625  BP: (!) 104/51 (!) 102/56 138/71   Pulse: 62 61 65 65  Resp: (!) 21 16    Temp:   97.6 F (36.4 C)   TempSrc:   Oral   SpO2: 100% 97% 99%   Weight:   54.1 kg   Height:   5\' 7"  (1.702 m)     Intake/Output Summary (Last 24 hours) at 07/31/2019 1033 Last data filed at 07/31/2019 0606 Gross per 24 hour  Intake --  Output 200 ml  Net -200 ml   Last 3 Weights 07/31/2019 07/30/2019 07/30/2019  Weight (lbs) 119 lb 3.2 oz 119 lb 7.8 oz 119 lb  Weight (kg) 54.069 kg 54.2 kg 53.978 kg     Body mass index is 18.67 kg/m.  General:  Elderly female in NAD HEENT: normal Neck: + JVD Vascular: No carotid bruits Cardiac:  Irregular rhythm, regular rate Lungs:  clear to auscultation bilaterally, no wheezing, rhonchi or rales  Abd: soft, nontender, no hepatomegaly  Ext: no edema Musculoskeletal:  No  deformities, BUE and BLE strength normal and equal Skin: warm and dry  Neuro:  CNs 2-12 intact, no focal abnormalities  noted Psych:  Normal affect   EKG:  The EKG was personally reviewed and demonstrates:  Atrial fibrillation with RVR ventricular rate 140 with ST depression inferior and lateral leads likely rate related - resolved on repeat tracing in NSR Telemetry:  Telemetry was personally reviewed and demonstrates:  Has maintained sinus rhythm in the 60-90s with PACs  Relevant CV Studies:  Echo 07/21/18: Study Conclusions  - Left ventricle: The cavity size was normal. Wall thickness was   increased in a pattern of mild LVH. Systolic function was mildly   to moderately reduced. The estimated ejection fraction was in the   range of 40% to 45%. There is akinesis of the   basal-midanteroseptal myocardium. There is hypokinesis of the   basalinferior myocardium. Doppler parameters are consistent with   abnormal left ventricular relaxation (grade 1 diastolic   dysfunction). - Aortic valve: Mildly to moderately calcified annulus. Trileaflet;   mildly calcified leaflets. - Mitral valve: Mildly calcified leaflets. There was trivial   regurgitation. - Right atrium: Central venous pressure (est): 3 mm Hg. - Atrial septum: No defect or patent foramen ovale was identified. - Tricuspid valve: There was trivial regurgitation. - Pulmonary arteries: Systolic pressure could not be accurately   estimated. - Pericardium, extracardiac: A prominent pericardial fat pad was   present.   Left heart cath 12/28/17:  Colon [place] LAD to Prox LAD lesion is 100% stenosed.  Ost 1st Mrg to 1st Mrg lesion is 40% stenosed.  Ost 2nd Mrg lesion is 30% stenosed.  Ost 3rd Mrg lesion is 90% stenosed.  Ost RCA to Dist RCA lesion is 100% stenosed.  LIMA graft was visualized by angiography and is normal in caliber.  The graft exhibits no disease.  SVG graft was visualized by angiography.  Origin to Prox Graft lesion is 100% stenosed.  SVG graft was not visualized due to known occlusion.  Origin to Prox Graft lesion is 100%  stenosed.  LV end diastolic pressure is moderately elevated.   1. 3 vessel occlusive CAD    - 100% proximal LAD    - 90% small OM3    - 100% proximal RCA. RCA fills distally by left to right collaterals. 2. Patent LIMA to the LAD 3. Occluded SVG presumably to OM 4. No graft visualized to RCA but vessel fills by collaterals.  5. Moderately elevated LVEDP.  Plan: medical management. Will optimize CHF therapy. Anticipate starting Eliquis in am if no bleeding problems.    Laboratory Data:  High Sensitivity Troponin:   Recent Labs  Lab 07/30/19 1514 07/30/19 2003 07/30/19 2347 07/31/19 0207 07/31/19 0622  TROPONINIHS 1,268* 8,657* 10,235* 10,828* 8,434*     Chemistry Recent Labs  Lab 07/30/19 1343 07/31/19 0207  NA 138 139  K 3.6 4.8  CL 107 111  CO2 23 23  GLUCOSE 103* 139*  BUN 27* 28*  CREATININE 1.29* 1.27*  CALCIUM 8.7* 8.5*  GFRNONAA 37* 38*  GFRAA 43* 44*  ANIONGAP 8 5    Recent Labs  Lab 07/30/19 1343  PROT 5.5*  ALBUMIN 3.1*  AST 17  ALT 13  ALKPHOS 60  BILITOT 0.8   Hematology Recent Labs  Lab 07/30/19 1343 07/31/19 0207  WBC 7.5 6.1  RBC 3.52* 3.22*  HGB 11.6* 10.7*  HCT 36.9 35.5*  MCV 104.8* 110.2*  MCH 33.0 33.2  MCHC 31.4 30.1  RDW  15.6* 15.6*  PLT 222 215   BNPNo results for input(s): BNP, PROBNP in the last 168 hours.  DDimer No results for input(s): DDIMER in the last 168 hours.   Radiology/Studies:  DG Chest Portable 1 View  Result Date: 07/30/2019 CLINICAL DATA:  Generalized weakness with nausea and dry heaving. EXAM: PORTABLE CHEST 1 VIEW COMPARISON:  03/21/2019 FINDINGS: Lungs are hyperexpanded. Interstitial markings are diffusely coarsened with chronic features. Staple line noted right upper lung. Cardiopericardial silhouette is at upper limits of normal for size. The visualized bony structures of the thorax are intact. Telemetry leads overlie the chest. IMPRESSION: Cardiomegaly with emphysema.  No acute cardiopulmonary  findings. Electronically Signed   By: Misty Stanley M.D.   On: 07/30/2019 12:56       TIMI Risk Score for Unstable Angina or Non-ST Elevation MI:   The patient's TIMI risk score is 5, which indicates a 26% risk of all cause mortality, new or recurrent myocardial infarction or need for urgent revascularization in the next 14 days.   Assessment and Plan:   1. NSTEMI 2. CAD with hx of CABG (1997) and last angiography  - hs troponin 1268 --> 7875 --> 10235 --> 10828 --> 8434 - initial EKG with ST depression inferior and lateral leads; resolved on subsequent tracings - changes likely related to rapid rate - heparin drip running - pt describes symptoms concerning for angina - DOE related to walking to mailbox that is new for her in the past 2 months, but does deny chest pain - she was unable to sleep well last night with continued SOB, which may be her anginal equivalent - she also reported SOB prior to her heart cath in 2019 - will repeat echocardiogram and angiography - plan for left heart cath tomorrow   3. Chronic systolic heart failure - EF has improved to 40-45% on last echo 1 year ago 07/21/18 - no diuretic given hypotension   4. Atrial fibrillation with RVR - pt presented with RVR in the 150s - DCCV x 2 in the ER were unsuccessful  - metoprolol 25 mg q 6 hr started, cardizem gtt D/C'ed overnight - eliquis on hold with heparin running   5. Hyperlipidemia - continue lipitor 80 mg   Gentle hydration overnight with plan for heart cath tomorrow. Anticipate left radial vs left femoral approach.  Last dose of eliquis was 07/30/19 PM.      For questions or updates, please contact [place] Please consult [email or web address] for contact info under     Signed, Ledora Bottcher, PA  07/31/2019 10:33 AM

## 2019-07-31 NOTE — Progress Notes (Signed)
TR[facility] night shift.  The patient's troponin level continues to increase and most recently is 10,235 ng/L.  She currently denies chest pain, palpitations, diaphoresis, nausea, dyspnea or orthopnea.  I discussed the case with cardiology on-call (Dr. Madelon Lips) to transfer to [facility] given her rising troponin level and the cardiology service will be consulting once she arrives there.    She mentions that she is a little anxious to be transferred to [facility] and has been unable to sleep.  I offered her to repeat a 2 mg Valium oral for anxiety and she agreed to take an extra dose.  Apixaban was changed to heparin infusion.  Transfer arrangements for progressive bed at [facility] have been made.  Tennis Must, MD.

## 2019-08-01 ENCOUNTER — Inpatient Hospital Stay (HOSPITAL_COMMUNITY)
Admission: EM | Disposition: A | Discharge status: Home / Self Care | Payer: Self-pay | Source: Home / Self Care | Attending: Internal Medicine

## 2019-08-01 DIAGNOSIS — R778 Other specified abnormalities of plasma proteins: Secondary | ICD-10-CM

## 2019-08-01 DIAGNOSIS — I25810 Atherosclerosis of coronary artery bypass graft(s) without angina pectoris: Secondary | ICD-10-CM

## 2019-08-01 DIAGNOSIS — I2510 Atherosclerotic heart disease of native coronary artery without angina pectoris: Secondary | ICD-10-CM

## 2019-08-01 HISTORY — PX: LEFT HEART CATH AND CORS/GRAFTS ANGIOGRAPHY: CATH118250

## 2019-08-01 LAB — APTT: aPTT: 61 seconds — ABNORMAL HIGH (ref 24–36)

## 2019-08-01 LAB — GLUCOSE, CAPILLARY
Glucose-Capillary: 204 mg/dL — ABNORMAL HIGH (ref 70–99)
Glucose-Capillary: 197 mg/dL — ABNORMAL HIGH (ref 70–99)
Glucose-Capillary: 324 mg/dL — ABNORMAL HIGH (ref 70–99)
Glucose-Capillary: 284 mg/dL — ABNORMAL HIGH (ref 70–99)

## 2019-08-01 LAB — CBC
HCT: 34.1 % — ABNORMAL LOW (ref 36.0–46.0)
Hemoglobin: 11 g/dL — ABNORMAL LOW (ref 12.0–15.0)
MCH: 32.5 pg (ref 26.0–34.0)
MCHC: 32.3 g/dL (ref 30.0–36.0)
MCV: 100.9 fL — ABNORMAL HIGH (ref 80.0–100.0)
Platelets: 207 10*3/uL (ref 150–400)
RBC: 3.38 MIL/uL — ABNORMAL LOW (ref 3.87–5.11)
RDW: 15.7 % — ABNORMAL HIGH (ref 11.5–15.5)
WBC: 8.3 10*3/uL (ref 4.0–10.5)
nRBC: 0 % (ref 0.0–0.2)

## 2019-08-01 LAB — BASIC METABOLIC PANEL
Anion gap: 10 (ref 5–15)
BUN: 28 mg/dL — ABNORMAL HIGH (ref 8–23)
CO2: 20 mmol/L — ABNORMAL LOW (ref 22–32)
Calcium: 9.3 mg/dL (ref 8.9–10.3)
Chloride: 111 mmol/L (ref 98–111)
Creatinine, Ser: 1.37 mg/dL — ABNORMAL HIGH (ref 0.44–1.00)
GFR calc Af Amer: 40 mL/min — ABNORMAL LOW (ref >60)
GFR calc non Af Amer: 34 mL/min — ABNORMAL LOW (ref >60)
Glucose, Bld: 154 mg/dL — ABNORMAL HIGH (ref 70–99)
Potassium: 4.7 mmol/L (ref 3.5–5.1)
Sodium: 141 mmol/L (ref 135–145)

## 2019-08-01 LAB — MAGNESIUM: Magnesium: 2 mg/dL (ref 1.7–2.4)

## 2019-08-01 LAB — T3, FREE: T3, Free: 3.1 pg/mL (ref 2.0–4.4)

## 2019-08-01 LAB — HEPARIN LEVEL (UNFRACTIONATED): Heparin Unfractionated: 0.77 IU/mL — ABNORMAL HIGH (ref 0.30–0.70)

## 2019-08-01 SURGERY — LEFT HEART CATH AND CORS/GRAFTS ANGIOGRAPHY
Anesthesia: LOCAL

## 2019-08-01 MED ORDER — SODIUM CHLORIDE 0.9% FLUSH: 3.0000 mL | INTRAVENOUS | Status: DC | PRN

## 2019-08-01 MED ORDER — LIDOCAINE HCL (PF) 1 % IJ SOLN
INTRAMUSCULAR | Status: DC | PRN
  Administered 2019-08-01: 15 mL

## 2019-08-01 MED ORDER — DIPHENHYDRAMINE HCL 50 MG/ML IJ SOLN: 50.0000 mg | Freq: Once | INTRAMUSCULAR | Status: AC

## 2019-08-01 MED ORDER — LIDOCAINE HCL (PF) 1 % IJ SOLN
INTRAMUSCULAR | Status: AC
  Filled 2019-08-01: qty 30

## 2019-08-01 MED ORDER — SODIUM CHLORIDE 0.9 % IV SOLN: INTRAVENOUS | Status: AC

## 2019-08-01 MED ORDER — MIDAZOLAM HCL 2 MG/2ML IJ SOLN
INTRAMUSCULAR | Status: DC | PRN
  Administered 2019-08-01: 0.5 mg via INTRAVENOUS

## 2019-08-01 MED ORDER — MIDAZOLAM HCL 2 MG/2ML IJ SOLN
INTRAMUSCULAR | Status: AC
  Filled 2019-08-01: qty 2

## 2019-08-01 MED ORDER — SODIUM CHLORIDE 0.9% FLUSH
3.0000 mL | Freq: Two times a day (BID) | INTRAVENOUS | Status: DC
  Administered 2019-08-01: 3 mL via INTRAVENOUS

## 2019-08-01 MED ORDER — DIPHENHYDRAMINE HCL 25 MG PO CAPS
25.0000 mg | ORAL_CAPSULE | Freq: Once | ORAL | Status: AC
  Administered 2019-08-01: 25 mg via ORAL
  Filled 2019-08-01: qty 1

## 2019-08-01 MED ORDER — HEPARIN (PORCINE) IN NACL 1000-0.9 UT/500ML-% IV SOLN
INTRAVENOUS | Status: AC
  Filled 2019-08-01: qty 1000

## 2019-08-01 MED ORDER — FENTANYL CITRATE (PF) 100 MCG/2ML IJ SOLN
INTRAMUSCULAR | Status: DC | PRN
  Administered 2019-08-01: 12.5 ug via INTRAVENOUS

## 2019-08-01 MED ORDER — HEPARIN (PORCINE) IN NACL 1000-0.9 UT/500ML-% IV SOLN
INTRAVENOUS | Status: DC | PRN
  Administered 2019-08-01 (×2): 500 mL

## 2019-08-01 MED ORDER — IOHEXOL 350 MG/ML SOLN
INTRAVENOUS | Status: DC | PRN
  Administered 2019-08-01: 40 mL

## 2019-08-01 MED ORDER — HYDRALAZINE HCL 20 MG/ML IJ SOLN: 10.0000 mg | INTRAMUSCULAR | Status: AC | PRN

## 2019-08-01 MED ORDER — SACCHAROMYCES BOULARDII 250 MG PO CAPS
250.0000 mg | ORAL_CAPSULE | Freq: Two times a day (BID) | ORAL | Status: DC
  Administered 2019-08-01 – 2019-08-02 (×2): 250 mg via ORAL
  Filled 2019-08-01 (×2): qty 1

## 2019-08-01 MED ORDER — VERAPAMIL HCL 2.5 MG/ML IV SOLN
INTRAVENOUS | Status: AC
  Filled 2019-08-01: qty 2

## 2019-08-01 MED ORDER — SODIUM CHLORIDE 0.9 % IV SOLN: 250.0000 mL | INTRAVENOUS | Status: DC | PRN

## 2019-08-01 MED ORDER — FENTANYL CITRATE (PF) 100 MCG/2ML IJ SOLN
INTRAMUSCULAR | Status: AC
  Filled 2019-08-01: qty 2

## 2019-08-01 MED ORDER — LABETALOL HCL 5 MG/ML IV SOLN: 10.0000 mg | INTRAVENOUS | Status: AC | PRN

## 2019-08-01 MED ORDER — CARVEDILOL 6.25 MG PO TABS
6.2500 mg | ORAL_TABLET | Freq: Two times a day (BID) | ORAL | Status: DC
  Administered 2019-08-01 – 2019-08-02 (×2): 6.25 mg via ORAL
  Filled 2019-08-01 (×3): qty 1

## 2019-08-01 SURGICAL SUPPLY — 9 items
CATH INFINITI 5FR MULTPACK ANG (CATHETERS) ×1 IMPLANT
CLOSURE MYNX CONTROL 5F (Vascular Products) ×1 IMPLANT
KIT HEART LEFT (KITS) ×2 IMPLANT
PACK CARDIAC CATHETERIZATION (CUSTOM PROCEDURE TRAY) ×2 IMPLANT
SHEATH PINNACLE 5F 10CM (SHEATH) ×1 IMPLANT
SHEATH PROBE COVER 6X72 (BAG) ×1 IMPLANT
TRANSDUCER W/STOPCOCK (MISCELLANEOUS) ×2 IMPLANT
TUBING CIL FLEX 10 FLL-RA (TUBING) ×2 IMPLANT
WIRE EMERALD 3MM-J .035X150CM (WIRE) ×1 IMPLANT

## 2019-08-01 NOTE — Progress Notes (Signed)
[place] for heparin Indication: chest pain/ACS  Allergies  Allergen Reactions  . Ciprofloxacin Rash and Itching  . Iodine   . Iodinated Diagnostic Agents Rash    Patient Measurements: Height: 5\' 7"  (170.2 cm) Weight: 119 lb 3.2 oz (54.1 kg) IBW/kg (Calculated) : 61.6 Heparin Dosing Weight: 54.2 kg  Vital Signs: Temp: 97.7 F (36.5 C) (01/26 2003) Temp Source: Oral (01/26 2003) BP: 144/64 (01/27 0038) Pulse Rate: 60 (01/27 0038)  Labs: Recent Labs    07/30/19 1343 07/30/19 1343 07/30/19 1347 07/30/19 2347 07/31/19 0207 07/31/19 0622 08/01/19 0101  HGB 11.6*   < >  --   --  10.7*  --  11.0*  HCT 36.9  --   --   --  35.5*  --  34.1*  PLT 222  --   --   --  215  --  207  APTT  --   --   --   --  32 31  --   LABPROT  --   --   --   --   --  14.4  --   INR  --   --   --   --   --  1.1  --   HEPARINUNFRC  --   --   --   --  1.87*  --  0.77*  CREATININE 1.29*  --   --   --  1.27*  --  1.37*  TROPONINIHS  --   --    < > 10,235* 10,828* 8,434*  --    < > = values in this interval not displayed.    Estimated Creatinine Clearance: 24.2 mL/min (A) (by C-G formula based on SCr of 1.37 mg/dL (H)).  Assessment: 86 yo lady to start heparin for CP.  Last dose eliquis ~ 2100 on 1/25    PTT slightly low at 61 sec on heparin gtt at 750 units/hr. Heparin level remains elevated due to apixaban effects. No issues with line or bleeding reported per RN.  Goal of Therapy:  Heparin level 0.3-0.7 units/ml aPTT 66-102 seconds Monitor platelets by anticoagulation protocol: Yes   Plan:  Increase heparin gtt to 850 units/hr Will f/u 8hr PTT and heparin level  Sherlon Handing, PharmD, BCPS Please see amion for complete clinical pharmacist phone list 08/01/2019,2:00 AM

## 2019-08-01 NOTE — H&P (View-Only) (Signed)
Progress Note  Patient Name: Debra [name] Date of Encounter: 08/01/2019  Primary Cardiologist: Rozann Lesches, MD   Subjective   Feeling better this morning. Had one episode of diarrhea this morning.   Inpatient Medications    Scheduled Meds:  amiodarone  200 mg Oral Daily   atorvastatin  80 mg Oral q1800   diphenhydrAMINE  25 mg Oral Once   Or   diphenhydrAMINE  50 mg Intravenous Once   feeding supplement (ENSURE ENLIVE)  237 mL Oral BID BM   gabapentin  100 mg Oral TID   insulin aspart  0-5 Units Subcutaneous QHS   insulin aspart  0-9 Units Subcutaneous TID WC   levothyroxine  50 mcg Oral Q0600   metoprolol tartrate  25 mg Oral Q6H   multivitamin with minerals  1 tablet Oral Daily   pantoprazole  40 mg Oral Daily   predniSONE  50 mg Oral Q6H   sodium chloride flush  3 mL Intravenous Q12H   Continuous Infusions:  sodium chloride     sodium chloride     sodium chloride 50 mL/hr at 07/31/19 2025   heparin 850 Units/hr (08/01/19 0305)   PRN Meds: sodium chloride, acetaminophen **OR** acetaminophen, albuterol, diazepam, ondansetron **OR** ondansetron (ZOFRAN) IV, polyethylene glycol, sodium chloride flush   Vital Signs    Vitals:   07/31/19 2003 08/01/19 0038 08/01/19 0606 08/01/19 0623  BP: 129/81 (!) 144/64 (!) 165/55   Pulse: 62 60 60 (!) 52  Resp:      Temp: 97.7 F (36.5 C)   (!) 97.4 F (36.3 C)  TempSrc: Oral   Oral  SpO2: 99%   100%  Weight:      Height:        Intake/Output Summary (Last 24 hours) at 08/01/2019 0852 Last data filed at 08/01/2019 0305 Gross per 24 hour  Intake 851.74 ml  Output --  Net 851.74 ml   Last 3 Weights 07/31/2019 07/30/2019 07/30/2019  Weight (lbs) 119 lb 3.2 oz 119 lb 7.8 oz 119 lb  Weight (kg) 54.069 kg 54.2 kg 53.978 kg      Telemetry    SR with PACs - Personally Reviewed  ECG    No new tracing this morning.  Physical Exam  Pleasant older WF, sitting on the side of the bed.  GEN: No acute  distress.   Neck: No JVD Cardiac: RRR, no murmurs, rubs, or gallops.  Respiratory: Clear to auscultation bilaterally. GI: Soft, nontender, non-distended  MS: No edema; No deformity. Neuro:  Nonfocal  Psych: Normal affect   Labs    High Sensitivity Troponin:   Recent Labs  Lab 07/30/19 1514 07/30/19 2003 07/30/19 2347 07/31/19 0207 07/31/19 0622  TROPONINIHS 1,268* 1,660* 10,235* 10,828* 8,434*      Chemistry Recent Labs  Lab 07/30/19 1343 07/31/19 0207 08/01/19 0101  NA 138 139 141  K 3.6 4.8 4.7  CL 107 111 111  CO2 23 23 20*  GLUCOSE 103* 139* 154*  BUN 27* 28* 28*  CREATININE 1.29* 1.27* 1.37*  CALCIUM 8.7* 8.5* 9.3  PROT 5.5*  --   --   ALBUMIN 3.1*  --   --   AST 17  --   --   ALT 13  --   --   ALKPHOS 60  --   --   BILITOT 0.8  --   --   GFRNONAA 37* 38* 34*  GFRAA 43* 44* 40*  ANIONGAP 8 5 10  Hematology Recent Labs  Lab 07/30/19 1343 07/31/19 0207 08/01/19 0101  WBC 7.5 6.1 8.3  RBC 3.52* 3.22* 3.38*  HGB 11.6* 10.7* 11.0*  HCT 36.9 35.5* 34.1*  MCV 104.8* 110.2* 100.9*  MCH 33.0 33.2 32.5  MCHC 31.4 30.1 32.3  RDW 15.6* 15.6* 15.7*  PLT 222 215 207    BNPNo results for input(s): BNP, PROBNP in the last 168 hours.   DDimer No results for input(s): DDIMER in the last 168 hours.   Radiology    DG Chest Portable 1 View  Result Date: 07/30/2019 CLINICAL DATA:  Generalized weakness with nausea and dry heaving. EXAM: PORTABLE CHEST 1 VIEW COMPARISON:  03/21/2019 FINDINGS: Lungs are hyperexpanded. Interstitial markings are diffusely coarsened with chronic features. Staple line noted right upper lung. Cardiopericardial silhouette is at upper limits of normal for size. The visualized bony structures of the thorax are intact. Telemetry leads overlie the chest. IMPRESSION: Cardiomegaly with emphysema.  No acute cardiopulmonary findings. Electronically Signed   By: Misty Stanley M.D.   On: 07/30/2019 12:56   ECHOCARDIOGRAM COMPLETE  Result  Date: 07/31/2019   ECHOCARDIOGRAM REPORT   Patient Name:   [name] Date of Exam: 07/31/2019 Medical Rec #:  [id number]     Height:       67.0 in Accession #:    [id number]    Weight:       119.2 lb Date of Birth:  Jul 22, 1928     BSA:          1.62 m Patient Age:    53 years      BP:           127/66 mmHg Patient Gender: F             HR:           68 bpm. Exam Location:  Inpatient Procedure: 2D Echo Indications:    Dyspnea 786.09 / R06.00  History:        Patient has prior history of Echocardiogram examinations, most                 recent 07/21/2018. CHF, NSTEMI and CAD, COPD, Arrythmias:Atrial                 Fibrillation, Signs/Symptoms:Hypotension; Risk Factors:Diabetes,                 Hypertension and Dyslipidemia.  Sonographer:    Vikki Ports Turrentine Referring Phys: [id number] [place]  1. Left ventricular ejection fraction, by visual estimation, is 35 to 40%. The left ventricle has moderately decreased function. There is no left ventricular hypertrophy.  2. Mild dyskinesis of the left ventricular, basal-mid inferoseptal wall and inferior wall (consistent with previous infarction in the right coronary artery distribution).  3. Severe hypokinesis of the left ventricular, basal anteroseptal wall. The apical segments contract normally (consistent with occlusion of the proximal LAD artery with patent graft to the distal vessel).  4. Abnormal septal motion consistent with post-operative status.  5. Elevated left atrial pressure.  6. Left ventricular diastolic parameters are consistent with Grade II diastolic dysfunction (pseudonormalization).  7. The left ventricle demonstrates regional wall motion abnormalities.  8. Global right ventricle has normal systolic function.The right ventricular size is normal. No increase in right ventricular wall thickness.  9. Left atrial size was mildly dilated. 10. Right atrial size was normal. 11. Mild mitral annular calcification. 12. The mitral valve is  normal in structure. Mild to moderate mitral valve  regurgitation. 13. The tricuspid valve is normal in structure. 14. The tricuspid valve is normal in structure. Tricuspid valve regurgitation is not demonstrated. 15. The aortic valve is normal in structure. Aortic valve regurgitation is not visualized. Mild aortic valve sclerosis without stenosis. 16. The pulmonic valve was grossly normal. Pulmonic valve regurgitation is trivial. 17. TR signal is inadequate for assessing pulmonary artery systolic pressure. 18. The inferior vena cava is dilated in size with <50% respiratory variability, suggesting right atrial pressure of 15 mmHg. 19. No significant change from prior study. 20. Prior images reviewed side by side. FINDINGS  Left Ventricle: Left ventricular ejection fraction, by visual estimation, is 35 to 40%. The left ventricle has moderately decreased function. Mild dyskinesis of the left ventricular, basal-mid inferoseptal wall and inferior wall. Severe hypokinesis of the left ventricular, basal anteroseptal wall. The left ventricle demonstrates regional wall motion abnormalities. The left ventricular internal cavity size was the left ventricle is normal in size. There is no left ventricular hypertrophy. Abnormal (paradoxical) septal motion consistent with post-operative status. Left ventricular diastolic parameters are consistent with Grade II diastolic dysfunction (pseudonormalization). Elevated left atrial pressure. Right Ventricle: The right ventricular size is normal. No increase in right ventricular wall thickness. Global RV systolic function is has normal systolic function. Left Atrium: Left atrial size was mildly dilated. Right Atrium: Right atrial size was normal in size Pericardium: There is no evidence of pericardial effusion. Mitral Valve: The mitral valve is normal in structure. Mild mitral annular calcification. Mild to moderate mitral valve regurgitation, with centrally-directed jet. Tricuspid Valve:  The tricuspid valve is normal in structure. Tricuspid valve regurgitation is not demonstrated. Aortic Valve: The aortic valve is normal in structure. Aortic valve regurgitation is not visualized. Mild aortic valve sclerosis is present, with no evidence of aortic valve stenosis. Pulmonic Valve: The pulmonic valve was grossly normal. Pulmonic valve regurgitation is trivial. Pulmonic regurgitation is trivial. Aorta: The aortic root and ascending aorta are structurally normal, with no evidence of dilitation. Venous: The inferior vena cava is dilated in size with less than 50% respiratory variability, suggesting right atrial pressure of 15 mmHg. IAS/Shunts: No atrial level shunt detected by color flow Doppler.  LEFT VENTRICLE PLAX 2D LVIDd:         4.40 cm       Diastology LVIDs:         3.60 cm       LV e' lateral:   7.65 cm/s LV PW:         0.70 cm       LV E/e' lateral: 15.8 LV IVS:        0.70 cm       LV e' medial:    3.30 cm/s LVOT diam:     1.70 cm       LV E/e' medial:  36.7 LV SV:         33 ml LV SV Index:   20.92 LVOT Area:     2.27 cm  LV Volumes (MOD) LV area d, A2C:    28.90 cm LV area d, A4C:    26.30 cm LV area s, A2C:    21.00 cm LV area s, A4C:    21.00 cm LV major d, A2C:   7.08 cm LV major d, A4C:   6.97 cm LV major s, A2C:   6.07 cm LV major s, A4C:   6.70 cm LV vol d, MOD A2C: 99.3 ml LV vol d, MOD A4C: 83.4 ml LV  vol s, MOD A2C: 64.5 ml LV vol s, MOD A4C: 57.1 ml LV SV MOD A2C:     34.8 ml LV SV MOD A4C:     83.4 ml LV SV MOD BP:      27.9 ml RIGHT VENTRICLE RV S prime:     13.50 cm/s TAPSE (M-mode): 1.4 cm LEFT ATRIUM             Index       RIGHT ATRIUM           Index LA diam:        4.00 cm 2.46 cm/m  RA Area:     19.90 cm LA Vol (A2C):   66.6 ml 41.04 ml/m RA Volume:   57.40 ml  35.37 ml/m LA Vol (A4C):   50.9 ml 31.36 ml/m LA Biplane Vol: 64.5 ml 39.74 ml/m  AORTIC VALVE LVOT Vmax:   109.00 cm/s LVOT Vmean:  68.900 cm/s LVOT VTI:    0.241 m  AORTA Ao Root diam: 2.70 cm MITRAL VALVE  MV Area (PHT): 3.53 cm              SHUNTS MV PHT:        62.35 msec            Systemic VTI:  0.24 m MV Decel Time: 215 msec              Systemic Diam: 1.70 cm MV E velocity: 121.00 cm/s 103 cm/s MV A velocity: 79.30 cm/s  70.3 cm/s MV E/A ratio:  1.53        1.5  Mihai Croitoru MD Electronically signed by Sanda Klein MD Signature Date/Time: 07/31/2019/3:01:06 PM    Final     Cardiac Studies   TTE: 07/31/19  IMPRESSIONS    1. Left ventricular ejection fraction, by visual estimation, is 35 to 40%. The left ventricle has moderately decreased function. There is no left ventricular hypertrophy.  2. Mild dyskinesis of the left ventricular, basal-mid inferoseptal wall and inferior wall (consistent with previous infarction in the right coronary artery distribution).  3. Severe hypokinesis of the left ventricular, basal anteroseptal wall. The apical segments contract normally (consistent with occlusion of the proximal LAD artery with patent graft to the distal vessel).  4. Abnormal septal motion consistent with post-operative status.  5. Elevated left atrial pressure.  6. Left ventricular diastolic parameters are consistent with Grade II diastolic dysfunction (pseudonormalization).  7. The left ventricle demonstrates regional wall motion abnormalities.  8. Global right ventricle has normal systolic function.The right ventricular size is normal. No increase in right ventricular wall thickness.  9. Left atrial size was mildly dilated. 10. Right atrial size was normal. 11. Mild mitral annular calcification. 12. The mitral valve is normal in structure. Mild to moderate mitral valve regurgitation. 13. The tricuspid valve is normal in structure. 14. The tricuspid valve is normal in structure. Tricuspid valve regurgitation is not demonstrated. 15. The aortic valve is normal in structure. Aortic valve regurgitation is not visualized. Mild aortic valve sclerosis without stenosis. 16. The pulmonic valve  was grossly normal. Pulmonic valve regurgitation is trivial. 17. TR signal is inadequate for assessing pulmonary artery systolic pressure. 18. The inferior vena cava is dilated in size with <50% respiratory variability, suggesting right atrial pressure of 15 mmHg. 19. No significant change from prior study. 20. Prior images reviewed side by side.  Patient Profile     86 y.o. female  with a hx of CAD s/p CABG  with patent LIMA-LAD and collaterals, chronic systolic heart failure, ischemic cardiomyopathy, paroxysmal atrial fibrillation on eliquis, HTN, CKD stage IV, DM2, COPD, PAD, Luetscher syndrome, and HLD who was seen for the evaluation of NSTEMI at the request of Dr. Denton Brick.  Assessment & Plan    1. NSTEMI: hsTn peaked at [address] in the setting of Afib RVR. Initial EKG showed ST depression in inferolateral leads, but resolved on subsequent tracings. Last dose of Eliquis 1/25, evening dose. Planned for cardiac cath today. No chest pain overnight.  2. PAF: remains on SR on telemetry. Eliquis held as above. On coreg 3.125mg  BID prior to admission, but has been on metoprolol q6hr here. Will transition back to coreg given low EF this evening as pressures are stable.  -- Started on maintenance dose of amiodarone this admission.   3. CKD stage 3: 1.2>>mild increase to 1.37 this morning. Has been receiving IVFs pre cath.   4. Chronic systolic HF: volume stable on exam. EF noted slightly lower this admission at 35-40%. Restarting coreg as above, no ARB/ACEi/spiro with renal dysfunction.   5. Hx of hypothyroidism: TSH levels are now low. Levothyroxine reduced to 55mcg daily. Management per primary.  For questions or updates, please contact [place] Please consult [email or web address] for contact info under        Signed, Reino Bellis, NP  08/01/2019, 8:52 AM

## 2019-08-01 NOTE — Progress Notes (Signed)
PROGRESS NOTE    Debra [name]  ZOX:[id number] DOB: September 04, 1928 DOA: 07/30/2019 PCP: Sharilyn Sites, MD    Brief Narrative:86 y.o.femalewith medical history significant forhypertension, diabetes mellitus, rheumatoid arthritis, CKD 3,systolic heart failure, asthma and COPD,luetschers syndrome.CAD s/p CABG and prior stent of LCx. History of paroxysmal AFib on Eliquis.  She presents to [facility], ED secondary to severe shortness of breath, she was noted to be in A. fib, with heart rate in the 150s, with low blood pressure, cardioversion attempted x2, which was unsuccessful, at did slow the heart rate, EKG noted with ST depression, and significantly elevated troponins, tarted on heparin drip for NSTEMI, transferred to [facility] for further management.  Assessment & Plan:   Active Problems:   Hypertension   Chronic renal insufficiency, stage 3 (moderate) (HCC)   Type 2 diabetes mellitus (HCC)   Non-ST elevation (NSTEMI) myocardial infarction [facility])   Atrial fibrillation with RVR (HCC)   Hypotension   #1 NSTEMI/A. fib RVR/chronic systolic CHF-cardiology following and plan is to cath her today.  Continue heparin drip beta-blockers and statin. Continue amiodarone per cardiology. Eliquis on hold for cath. Patient was on Entresto which is on hold defer to cardiology to restart. Cath today unchanged from 2019, recommends medical management.  #2 diarrhea improving C. difficile negative start Florastor.  Diarrhea could be related to hyperthyroidism.  #3 history of hypothyroidism now hyperthyroid -Synthroid dose is again decreased yesterday.    #4 type 2 diabetes Lantus on hold patient n.p.o. continue SSI hemoglobin A1c  #5 history of essential hypertension blood pressure 165/55  #6 stage III CKD stable follow-up in a.m. status post cath  #7 history of COPD/asthma stable  #8 weight loss follow-up with GI with history of colon cancer, likely secondary to hyperthyroidism  2.     Nutrition Problem: Increased nutrient needs Etiology: chronic illness     Signs/Symptoms: estimated needs    Interventions: Ensure Enlive (each supplement provides 350kcal and 20 grams of protein), MVI  Estimated body mass index is 18.67 kg/m as calculated from the following:   Height as of this encounter: 5\' 7"  (1.702 m).   Weight as of this encounter: 54.1 kg.  DVT prophylaxis: Heparin Code Status: Full code Family Communication none  disposition Plan: Patient came from home plan is to discharge to home likely tomorrow she had cath today.  Consultants:   Cardiology   Procedures: Cardiac catheterization 08/01/2019  Antimicrobials: None  Subjective: Patient resting in bed denies any chest pain diarrhea is getting better no nausea vomiting  Objective: Vitals:   08/01/19 0038 08/01/19 0606 08/01/19 0623 08/01/19 1055  BP: (!) 144/64 (!) 165/55    Pulse: 60 60 (!) 52   Resp:      Temp:   (!) 97.4 F (36.3 C)   TempSrc:   Oral   SpO2:   100% 99%  Weight:      Height:        Intake/Output Summary (Last 24 hours) at 08/01/2019 1550 Last data filed at 08/01/2019 0305 Gross per 24 hour  Intake 409.24 ml  Output --  Net 409.24 ml   Filed Weights   07/30/19 1217 07/30/19 2109 07/31/19 0557  Weight: 54 kg 54.2 kg 54.1 kg    Examination:  General exam: Appears calm and comfortable  Respiratory system: Clear to auscultation. Respiratory effort normal. Cardiovascular system: S1 & S2 heard, RRR. No JVD, murmurs, rubs, gallops or clicks. No pedal edema. Gastrointestinal system: Abdomen is nondistended, soft and nontender.  No organomegaly or masses felt. Normal bowel sounds heard. Central nervous system: Alert and oriented. No focal neurological deficits. Extremities: Symmetric 5 x 5 power. Skin: No rashes, lesions or ulcers Psychiatry: Judgement and insight appear normal. Mood & affect appropriate.     Data Reviewed: I have personally reviewed  following labs and imaging studies  CBC: Recent Labs  Lab 07/30/19 1343 07/31/19 0207 08/01/19 0101  WBC 7.5 6.1 8.3  NEUTROABS 6.1  --   --   HGB 11.6* 10.7* 11.0*  HCT 36.9 35.5* 34.1*  MCV 104.8* 110.2* 100.9*  PLT 222 215 440   Basic Metabolic Panel: Recent Labs  Lab 07/30/19 1343 07/31/19 0207 08/01/19 0101  NA 138 139 141  K 3.6 4.8 4.7  CL 107 111 111  CO2 23 23 20*  GLUCOSE 103* 139* 154*  BUN 27* 28* 28*  CREATININE 1.29* 1.27* 1.37*  CALCIUM 8.7* 8.5* 9.3  MG 1.7  --  2.0   GFR: Estimated Creatinine Clearance: 24.2 mL/min (A) (by C-G formula based on SCr of 1.37 mg/dL (H)). Liver Function Tests: Recent Labs  Lab 07/30/19 1343  AST 17  ALT 13  ALKPHOS 60  BILITOT 0.8  PROT 5.5*  ALBUMIN 3.1*   Recent Labs  Lab 07/30/19 1343  LIPASE 21   No results for input(s): AMMONIA in the last 168 hours. Coagulation Profile: Recent Labs  Lab 07/31/19 0622  INR 1.1   Cardiac Enzymes: No results for input(s): CKTOTAL, CKMB, CKMBINDEX, TROPONINI in the last 168 hours. BNP (last 3 results) No results for input(s): PROBNP in the last 8760 hours. HbA1C: Recent Labs    07/30/19 1343  HGBA1C 6.3*   CBG: Recent Labs  Lab 07/31/19 1200 07/31/19 1805 07/31/19 2143 08/01/19 0815 08/01/19 1157  GLUCAP 129* 194* 161* 204* 197*   Lipid Profile: No results for input(s): CHOL, HDL, LDLCALC, TRIG, CHOLHDL, LDLDIRECT in the last 72 hours. Thyroid Function Tests: Recent Labs    07/30/19 1343 07/30/19 1514  TSH 0.019*  --   FREET4  --  2.92*  T3FREE  --  3.1   Anemia Panel: No results for input(s): VITAMINB12, FOLATE, FERRITIN, TIBC, IRON, RETICCTPCT in the last 72 hours. Sepsis Labs: No results for input(s): PROCALCITON, LATICACIDVEN in the last 168 hours.  Recent Results (from the past 240 hour(s))  Respiratory Panel by RT PCR (Flu A&B, Covid) - Nasopharyngeal Swab     Status: None   Collection Time: 07/30/19  3:57 PM   Specimen: Nasopharyngeal  Swab  Result Value Ref Range Status   SARS Coronavirus 2 by RT PCR NEGATIVE NEGATIVE Final    Comment: (NOTE) SARS-CoV-2 target nucleic acids are NOT DETECTED. The SARS-CoV-2 RNA is generally detectable in upper respiratoy specimens during the acute phase of infection. The lowest concentration of SARS-CoV-2 viral copies this assay can detect is 131 copies/mL. A negative result does not preclude SARS-Cov-2 infection and should not be used as the sole basis for treatment or other patient management decisions. A negative result may occur with  improper specimen collection/handling, submission of specimen other than nasopharyngeal swab, presence of viral mutation(s) within the areas targeted by this assay, and inadequate number of viral copies (<131 copies/mL). A negative result must be combined with clinical observations, patient history, and epidemiological information. The expected result is Negative. Fact Sheet for Patients:  PinkCheek.be Fact Sheet for Healthcare Providers:  GravelBags.it This test is not yet ap proved or cleared by the [place] FDA and  has  been authorized for detection and/or diagnosis of SARS-CoV-2 by FDA under an Emergency Use Authorization (EUA). This EUA will remain  in effect (meaning this test can be used) for the duration of the COVID-19 declaration under Section 564(b)(1) of the Act, 21 U.S.C. section 360bbb-3(b)(1), unless the authorization is terminated or revoked sooner.    Influenza A by PCR NEGATIVE NEGATIVE Final   Influenza B by PCR NEGATIVE NEGATIVE Final    Comment: (NOTE) The Xpert Xpress SARS-CoV-2/FLU/RSV assay is intended as an aid in  the diagnosis of influenza from Nasopharyngeal swab specimens and  should not be used as a sole basis for treatment. Nasal washings and  aspirates are unacceptable for Xpert Xpress SARS-CoV-2/FLU/RSV  testing. Fact Sheet for  Patients: PinkCheek.be Fact Sheet for Healthcare Providers: GravelBags.it This test is not yet approved or cleared by the [place] FDA and  has been authorized for detection and/or diagnosis of SARS-CoV-2 by  FDA under an Emergency Use Authorization (EUA). This EUA will remain  in effect (meaning this test can be used) for the duration of the  Covid-19 declaration under Section 564(b)(1) of the Act, 21  U.S.C. section 360bbb-3(b)(1), unless the authorization is  terminated or revoked. Performed at [facility], [address], [place], [place] [address]   MRSA PCR Screening     Status: None   Collection Time: 07/30/19  8:09 PM   Specimen: Nasal Mucosa; Nasopharyngeal  Result Value Ref Range Status   MRSA by PCR NEGATIVE NEGATIVE Final    Comment:        The GeneXpert MRSA Assay (FDA approved for NASAL specimens only), is one component of a comprehensive MRSA colonization surveillance program. It is not intended to diagnose MRSA infection nor to guide or monitor treatment for MRSA infections. Performed at [facility], [address], [place], [place] [address]   C difficile quick scan w PCR reflex     Status: None   Collection Time: 07/31/19  4:20 PM   Specimen: STOOL  Result Value Ref Range Status   C Diff antigen NEGATIVE NEGATIVE Final   C Diff toxin NEGATIVE NEGATIVE Final   C Diff interpretation No C. difficile detected.  Final    Comment: Performed at [facility], [place] [address], [place], [place] [address]         Radiology Studies: CARDIAC CATHETERIZATION  Result Date: 08/01/2019  Ost LAD to Prox LAD lesion is 100% stenosed.  Ost 1st Mrg to 1st Mrg lesion is 40% stenosed.  Ost 2nd Mrg lesion is 30% stenosed.  Ost 3rd Mrg lesion is 90% stenosed.  Ost RCA to Dist RCA lesion is 100% stenosed.  LIMA and is normal in caliber.  The graft exhibits no disease.  SVG.  Origin to Prox Graft lesion  is 100% stenosed.  SVG due to known occlusion.  Origin to Prox Graft lesion is 100% stenosed.  Prox Cx to Mid Cx lesion is 10% stenosed.  1. Triple vessel CAD s/p CABG with 1 patent bypass graft 2. Occluded proximal LAD. Patent LIMA to mid LAD 3. Known occlusion of the SVG to OM. The mid Circumflex stent is patent. Severe disease in a small caliber OM branch that fills from left to left collaterals. Unchanged from last cath. 4. Chronic occlusion of the proximal RCA. Known occlusion of the vein graft to the distal RCA. The distal RCA fills from left to right collaterals. Recommendations: Coronary disease unchanged from last cath in 2019. Continue medical management of  CAD.   ECHOCARDIOGRAM COMPLETE  Result Date: 07/31/2019   ECHOCARDIOGRAM REPORT   Patient Name:   LEYDI WINSTEAD Date of Exam: 07/31/2019 Medical Rec #:  [id number]     Height:       67.0 in Accession #:    [id number]    Weight:       119.2 lb Date of Birth:  1929-06-19     BSA:          1.62 m Patient Age:    44 years      BP:           127/66 mmHg Patient Gender: F             HR:           68 bpm. Exam Location:  Inpatient Procedure: 2D Echo Indications:    Dyspnea 786.09 / R06.00  History:        Patient has prior history of Echocardiogram examinations, most                 recent 07/21/2018. CHF, NSTEMI and CAD, COPD, Arrythmias:Atrial                 Fibrillation, Signs/Symptoms:Hypotension; Risk Factors:Diabetes,                 Hypertension and Dyslipidemia.  Sonographer:    Vikki Ports Turrentine Referring Phys: [id number] [place]  1. Left ventricular ejection fraction, by visual estimation, is 35 to 40%. The left ventricle has moderately decreased function. There is no left ventricular hypertrophy.  2. Mild dyskinesis of the left ventricular, basal-mid inferoseptal wall and inferior wall (consistent with previous infarction in the right coronary artery distribution).  3. Severe hypokinesis of the left ventricular, basal  anteroseptal wall. The apical segments contract normally (consistent with occlusion of the proximal LAD artery with patent graft to the distal vessel).  4. Abnormal septal motion consistent with post-operative status.  5. Elevated left atrial pressure.  6. Left ventricular diastolic parameters are consistent with Grade II diastolic dysfunction (pseudonormalization).  7. The left ventricle demonstrates regional wall motion abnormalities.  8. Global right ventricle has normal systolic function.The right ventricular size is normal. No increase in right ventricular wall thickness.  9. Left atrial size was mildly dilated. 10. Right atrial size was normal. 11. Mild mitral annular calcification. 12. The mitral valve is normal in structure. Mild to moderate mitral valve regurgitation. 13. The tricuspid valve is normal in structure. 14. The tricuspid valve is normal in structure. Tricuspid valve regurgitation is not demonstrated. 15. The aortic valve is normal in structure. Aortic valve regurgitation is not visualized. Mild aortic valve sclerosis without stenosis. 16. The pulmonic valve was grossly normal. Pulmonic valve regurgitation is trivial. 17. TR signal is inadequate for assessing pulmonary artery systolic pressure. 18. The inferior vena cava is dilated in size with <50% respiratory variability, suggesting right atrial pressure of 15 mmHg. 19. No significant change from prior study. 20. Prior images reviewed side by side. FINDINGS  Left Ventricle: Left ventricular ejection fraction, by visual estimation, is 35 to 40%. The left ventricle has moderately decreased function. Mild dyskinesis of the left ventricular, basal-mid inferoseptal wall and inferior wall. Severe hypokinesis of the left ventricular, basal anteroseptal wall. The left ventricle demonstrates regional wall motion abnormalities. The left ventricular internal cavity size was the left ventricle is normal in size. There is no left ventricular hypertrophy.  Abnormal (paradoxical) septal motion consistent with post-operative status. Left  ventricular diastolic parameters are consistent with Grade II diastolic dysfunction (pseudonormalization). Elevated left atrial pressure. Right Ventricle: The right ventricular size is normal. No increase in right ventricular wall thickness. Global RV systolic function is has normal systolic function. Left Atrium: Left atrial size was mildly dilated. Right Atrium: Right atrial size was normal in size Pericardium: There is no evidence of pericardial effusion. Mitral Valve: The mitral valve is normal in structure. Mild mitral annular calcification. Mild to moderate mitral valve regurgitation, with centrally-directed jet. Tricuspid Valve: The tricuspid valve is normal in structure. Tricuspid valve regurgitation is not demonstrated. Aortic Valve: The aortic valve is normal in structure. Aortic valve regurgitation is not visualized. Mild aortic valve sclerosis is present, with no evidence of aortic valve stenosis. Pulmonic Valve: The pulmonic valve was grossly normal. Pulmonic valve regurgitation is trivial. Pulmonic regurgitation is trivial. Aorta: The aortic root and ascending aorta are structurally normal, with no evidence of dilitation. Venous: The inferior vena cava is dilated in size with less than 50% respiratory variability, suggesting right atrial pressure of 15 mmHg. IAS/Shunts: No atrial level shunt detected by color flow Doppler.  LEFT VENTRICLE PLAX 2D LVIDd:         4.40 cm       Diastology LVIDs:         3.60 cm       LV e' lateral:   7.65 cm/s LV PW:         0.70 cm       LV E/e' lateral: 15.8 LV IVS:        0.70 cm       LV e' medial:    3.30 cm/s LVOT diam:     1.70 cm       LV E/e' medial:  36.7 LV SV:         33 ml LV SV Index:   20.92 LVOT Area:     2.27 cm  LV Volumes (MOD) LV area d, A2C:    28.90 cm LV area d, A4C:    26.30 cm LV area s, A2C:    21.00 cm LV area s, A4C:    21.00 cm LV major d, A2C:   7.08 cm LV  major d, A4C:   6.97 cm LV major s, A2C:   6.07 cm LV major s, A4C:   6.70 cm LV vol d, MOD A2C: 99.3 ml LV vol d, MOD A4C: 83.4 ml LV vol s, MOD A2C: 64.5 ml LV vol s, MOD A4C: 57.1 ml LV SV MOD A2C:     34.8 ml LV SV MOD A4C:     83.4 ml LV SV MOD BP:      27.9 ml RIGHT VENTRICLE RV S prime:     13.50 cm/s TAPSE (M-mode): 1.4 cm LEFT ATRIUM             Index       RIGHT ATRIUM           Index LA diam:        4.00 cm 2.46 cm/m  RA Area:     19.90 cm LA Vol (A2C):   66.6 ml 41.04 ml/m RA Volume:   57.40 ml  35.37 ml/m LA Vol (A4C):   50.9 ml 31.36 ml/m LA Biplane Vol: 64.5 ml 39.74 ml/m  AORTIC VALVE LVOT Vmax:   109.00 cm/s LVOT Vmean:  68.900 cm/s LVOT VTI:    0.241 m  AORTA Ao Root diam: 2.70 cm MITRAL VALVE MV  Area (PHT): 3.53 cm              SHUNTS MV PHT:        62.35 msec            Systemic VTI:  0.24 m MV Decel Time: 215 msec              Systemic Diam: 1.70 cm MV E velocity: 121.00 cm/s 103 cm/s MV A velocity: 79.30 cm/s  70.3 cm/s MV E/A ratio:  1.53        1.5  Mihai Croitoru MD Electronically signed by Sanda Klein MD Signature Date/Time: 07/31/2019/3:01:06 PM    Final         Scheduled Meds: . amiodarone  200 mg Oral Daily  . atorvastatin  80 mg Oral q1800  . carvedilol  6.25 mg Oral BID WC  . feeding supplement (ENSURE ENLIVE)  237 mL Oral BID BM  . gabapentin  100 mg Oral TID  . insulin aspart  0-5 Units Subcutaneous QHS  . insulin aspart  0-9 Units Subcutaneous TID WC  . levothyroxine  50 mcg Oral Q0600  . multivitamin with minerals  1 tablet Oral Daily  . pantoprazole  40 mg Oral Daily  . sodium chloride flush  3 mL Intravenous Q12H   Continuous Infusions: . sodium chloride 50 mL/hr at 07/31/19 2025  . sodium chloride 75 mL/hr at 08/01/19 1145  . sodium chloride       LOS: 2 days     Georgette Shell, MD [facility]  If 7PM-7AM, please contact night-coverage [email or web address] Password [email or web address] 08/01/2019, 3:50 PM

## 2019-08-01 NOTE — Interval H&P Note (Signed)
History and Physical Interval Note:  08/01/2019 10:49 AM  Debra [name]  has presented today for surgery, with the diagnosis of non-stemi.  The various methods of treatment have been discussed with the patient and family. After consideration of risks, benefits and other options for treatment, the patient has consented to  Procedure(s): LEFT HEART CATH AND CORS/GRAFTS ANGIOGRAPHY (N/A) as a surgical intervention.  The patient's history has been reviewed, patient examined, no change in status, stable for surgery.  I have reviewed the patient's chart and labs.  Questions were answered to the patient's satisfaction.    Cath Lab Visit (complete for each Cath Lab visit)  Clinical Evaluation Leading to the Procedure:   ACS: Yes.    Non-ACS:    Anginal Classification: CCS II  Anti-ischemic medical therapy: Minimal Therapy (1 class of medications)  Non-Invasive Test Results: No non-invasive testing performed  Prior CABG: Previous CABG        Debra [name]

## 2019-08-01 NOTE — Progress Notes (Signed)
Progress Note  Patient Name: Debra [name] Date of Encounter: 08/01/2019  Primary Cardiologist: Rozann Lesches, MD   Subjective   Feeling better this morning. Had one episode of diarrhea this morning.   Inpatient Medications    Scheduled Meds:  amiodarone  200 mg Oral Daily   atorvastatin  80 mg Oral q1800   diphenhydrAMINE  25 mg Oral Once   Or   diphenhydrAMINE  50 mg Intravenous Once   feeding supplement (ENSURE ENLIVE)  237 mL Oral BID BM   gabapentin  100 mg Oral TID   insulin aspart  0-5 Units Subcutaneous QHS   insulin aspart  0-9 Units Subcutaneous TID WC   levothyroxine  50 mcg Oral Q0600   metoprolol tartrate  25 mg Oral Q6H   multivitamin with minerals  1 tablet Oral Daily   pantoprazole  40 mg Oral Daily   predniSONE  50 mg Oral Q6H   sodium chloride flush  3 mL Intravenous Q12H   Continuous Infusions:  sodium chloride     sodium chloride     sodium chloride 50 mL/hr at 07/31/19 2025   heparin 850 Units/hr (08/01/19 0305)   PRN Meds: sodium chloride, acetaminophen **OR** acetaminophen, albuterol, diazepam, ondansetron **OR** ondansetron (ZOFRAN) IV, polyethylene glycol, sodium chloride flush   Vital Signs    Vitals:   07/31/19 2003 08/01/19 0038 08/01/19 0606 08/01/19 0623  BP: 129/81 (!) 144/64 (!) 165/55   Pulse: 62 60 60 (!) 52  Resp:      Temp: 97.7 F (36.5 C)   (!) 97.4 F (36.3 C)  TempSrc: Oral   Oral  SpO2: 99%   100%  Weight:      Height:        Intake/Output Summary (Last 24 hours) at 08/01/2019 0852 Last data filed at 08/01/2019 0305 Gross per 24 hour  Intake 851.74 ml  Output --  Net 851.74 ml   Last 3 Weights 07/31/2019 07/30/2019 07/30/2019  Weight (lbs) 119 lb 3.2 oz 119 lb 7.8 oz 119 lb  Weight (kg) 54.069 kg 54.2 kg 53.978 kg      Telemetry    SR with PACs - Personally Reviewed  ECG    No new tracing this morning.  Physical Exam  Pleasant older WF, sitting on the side of the bed.  GEN: No acute  distress.   Neck: No JVD Cardiac: RRR, no murmurs, rubs, or gallops.  Respiratory: Clear to auscultation bilaterally. GI: Soft, nontender, non-distended  MS: No edema; No deformity. Neuro:  Nonfocal  Psych: Normal affect   Labs    High Sensitivity Troponin:   Recent Labs  Lab 07/30/19 1514 07/30/19 2003 07/30/19 2347 07/31/19 0207 07/31/19 0622  TROPONINIHS 1,268* 0,623* 10,235* 10,828* 8,434*      Chemistry Recent Labs  Lab 07/30/19 1343 07/31/19 0207 08/01/19 0101  NA 138 139 141  K 3.6 4.8 4.7  CL 107 111 111  CO2 23 23 20*  GLUCOSE 103* 139* 154*  BUN 27* 28* 28*  CREATININE 1.29* 1.27* 1.37*  CALCIUM 8.7* 8.5* 9.3  PROT 5.5*  --   --   ALBUMIN 3.1*  --   --   AST 17  --   --   ALT 13  --   --   ALKPHOS 60  --   --   BILITOT 0.8  --   --   GFRNONAA 37* 38* 34*  GFRAA 43* 44* 40*  ANIONGAP 8 5 10  Hematology Recent Labs  Lab 07/30/19 1343 07/31/19 0207 08/01/19 0101  WBC 7.5 6.1 8.3  RBC 3.52* 3.22* 3.38*  HGB 11.6* 10.7* 11.0*  HCT 36.9 35.5* 34.1*  MCV 104.8* 110.2* 100.9*  MCH 33.0 33.2 32.5  MCHC 31.4 30.1 32.3  RDW 15.6* 15.6* 15.7*  PLT 222 215 207    BNPNo results for input(s): BNP, PROBNP in the last 168 hours.   DDimer No results for input(s): DDIMER in the last 168 hours.   Radiology    DG Chest Portable 1 View  Result Date: 07/30/2019 CLINICAL DATA:  Generalized weakness with nausea and dry heaving. EXAM: PORTABLE CHEST 1 VIEW COMPARISON:  03/21/2019 FINDINGS: Lungs are hyperexpanded. Interstitial markings are diffusely coarsened with chronic features. Staple line noted right upper lung. Cardiopericardial silhouette is at upper limits of normal for size. The visualized bony structures of the thorax are intact. Telemetry leads overlie the chest. IMPRESSION: Cardiomegaly with emphysema.  No acute cardiopulmonary findings. Electronically Signed   By: Misty Stanley M.D.   On: 07/30/2019 12:56   ECHOCARDIOGRAM COMPLETE  Result  Date: 07/31/2019   ECHOCARDIOGRAM REPORT   Patient Name:   Debra [name] Date of Exam: 07/31/2019 Medical Rec #:  [id number]     Height:       67.0 in Accession #:    [id number]    Weight:       119.2 lb Date of Birth:  1928-08-04     BSA:          1.62 m Patient Age:    54 years      BP:           127/66 mmHg Patient Gender: F             HR:           68 bpm. Exam Location:  Inpatient Procedure: 2D Echo Indications:    Dyspnea 786.09 / R06.00  History:        Patient has prior history of Echocardiogram examinations, most                 recent 07/21/2018. CHF, NSTEMI and CAD, COPD, Arrythmias:Atrial                 Fibrillation, Signs/Symptoms:Hypotension; Risk Factors:Diabetes,                 Hypertension and Dyslipidemia.  Sonographer:    Vikki Ports Turrentine Referring Phys: [id number] [place]  1. Left ventricular ejection fraction, by visual estimation, is 35 to 40%. The left ventricle has moderately decreased function. There is no left ventricular hypertrophy.  2. Mild dyskinesis of the left ventricular, basal-mid inferoseptal wall and inferior wall (consistent with previous infarction in the right coronary artery distribution).  3. Severe hypokinesis of the left ventricular, basal anteroseptal wall. The apical segments contract normally (consistent with occlusion of the proximal LAD artery with patent graft to the distal vessel).  4. Abnormal septal motion consistent with post-operative status.  5. Elevated left atrial pressure.  6. Left ventricular diastolic parameters are consistent with Grade II diastolic dysfunction (pseudonormalization).  7. The left ventricle demonstrates regional wall motion abnormalities.  8. Global right ventricle has normal systolic function.The right ventricular size is normal. No increase in right ventricular wall thickness.  9. Left atrial size was mildly dilated. 10. Right atrial size was normal. 11. Mild mitral annular calcification. 12. The mitral valve is  normal in structure. Mild to moderate mitral valve  regurgitation. 13. The tricuspid valve is normal in structure. 14. The tricuspid valve is normal in structure. Tricuspid valve regurgitation is not demonstrated. 15. The aortic valve is normal in structure. Aortic valve regurgitation is not visualized. Mild aortic valve sclerosis without stenosis. 16. The pulmonic valve was grossly normal. Pulmonic valve regurgitation is trivial. 17. TR signal is inadequate for assessing pulmonary artery systolic pressure. 18. The inferior vena cava is dilated in size with <50% respiratory variability, suggesting right atrial pressure of 15 mmHg. 19. No significant change from prior study. 20. Prior images reviewed side by side. FINDINGS  Left Ventricle: Left ventricular ejection fraction, by visual estimation, is 35 to 40%. The left ventricle has moderately decreased function. Mild dyskinesis of the left ventricular, basal-mid inferoseptal wall and inferior wall. Severe hypokinesis of the left ventricular, basal anteroseptal wall. The left ventricle demonstrates regional wall motion abnormalities. The left ventricular internal cavity size was the left ventricle is normal in size. There is no left ventricular hypertrophy. Abnormal (paradoxical) septal motion consistent with post-operative status. Left ventricular diastolic parameters are consistent with Grade II diastolic dysfunction (pseudonormalization). Elevated left atrial pressure. Right Ventricle: The right ventricular size is normal. No increase in right ventricular wall thickness. Global RV systolic function is has normal systolic function. Left Atrium: Left atrial size was mildly dilated. Right Atrium: Right atrial size was normal in size Pericardium: There is no evidence of pericardial effusion. Mitral Valve: The mitral valve is normal in structure. Mild mitral annular calcification. Mild to moderate mitral valve regurgitation, with centrally-directed jet. Tricuspid Valve:  The tricuspid valve is normal in structure. Tricuspid valve regurgitation is not demonstrated. Aortic Valve: The aortic valve is normal in structure. Aortic valve regurgitation is not visualized. Mild aortic valve sclerosis is present, with no evidence of aortic valve stenosis. Pulmonic Valve: The pulmonic valve was grossly normal. Pulmonic valve regurgitation is trivial. Pulmonic regurgitation is trivial. Aorta: The aortic root and ascending aorta are structurally normal, with no evidence of dilitation. Venous: The inferior vena cava is dilated in size with less than 50% respiratory variability, suggesting right atrial pressure of 15 mmHg. IAS/Shunts: No atrial level shunt detected by color flow Doppler.  LEFT VENTRICLE PLAX 2D LVIDd:         4.40 cm       Diastology LVIDs:         3.60 cm       LV e' lateral:   7.65 cm/s LV PW:         0.70 cm       LV E/e' lateral: 15.8 LV IVS:        0.70 cm       LV e' medial:    3.30 cm/s LVOT diam:     1.70 cm       LV E/e' medial:  36.7 LV SV:         33 ml LV SV Index:   20.92 LVOT Area:     2.27 cm  LV Volumes (MOD) LV area d, A2C:    28.90 cm LV area d, A4C:    26.30 cm LV area s, A2C:    21.00 cm LV area s, A4C:    21.00 cm LV major d, A2C:   7.08 cm LV major d, A4C:   6.97 cm LV major s, A2C:   6.07 cm LV major s, A4C:   6.70 cm LV vol d, MOD A2C: 99.3 ml LV vol d, MOD A4C: 83.4 ml LV  vol s, MOD A2C: 64.5 ml LV vol s, MOD A4C: 57.1 ml LV SV MOD A2C:     34.8 ml LV SV MOD A4C:     83.4 ml LV SV MOD BP:      27.9 ml RIGHT VENTRICLE RV S prime:     13.50 cm/s TAPSE (M-mode): 1.4 cm LEFT ATRIUM             Index       RIGHT ATRIUM           Index LA diam:        4.00 cm 2.46 cm/m  RA Area:     19.90 cm LA Vol (A2C):   66.6 ml 41.04 ml/m RA Volume:   57.40 ml  35.37 ml/m LA Vol (A4C):   50.9 ml 31.36 ml/m LA Biplane Vol: 64.5 ml 39.74 ml/m  AORTIC VALVE LVOT Vmax:   109.00 cm/s LVOT Vmean:  68.900 cm/s LVOT VTI:    0.241 m  AORTA Ao Root diam: 2.70 cm MITRAL VALVE  MV Area (PHT): 3.53 cm              SHUNTS MV PHT:        62.35 msec            Systemic VTI:  0.24 m MV Decel Time: 215 msec              Systemic Diam: 1.70 cm MV E velocity: 121.00 cm/s 103 cm/s MV A velocity: 79.30 cm/s  70.3 cm/s MV E/A ratio:  1.53        1.5  Mihai Croitoru MD Electronically signed by Sanda Klein MD Signature Date/Time: 07/31/2019/3:01:06 PM    Final     Cardiac Studies   TTE: 07/31/19  IMPRESSIONS    1. Left ventricular ejection fraction, by visual estimation, is 35 to 40%. The left ventricle has moderately decreased function. There is no left ventricular hypertrophy.  2. Mild dyskinesis of the left ventricular, basal-mid inferoseptal wall and inferior wall (consistent with previous infarction in the right coronary artery distribution).  3. Severe hypokinesis of the left ventricular, basal anteroseptal wall. The apical segments contract normally (consistent with occlusion of the proximal LAD artery with patent graft to the distal vessel).  4. Abnormal septal motion consistent with post-operative status.  5. Elevated left atrial pressure.  6. Left ventricular diastolic parameters are consistent with Grade II diastolic dysfunction (pseudonormalization).  7. The left ventricle demonstrates regional wall motion abnormalities.  8. Global right ventricle has normal systolic function.The right ventricular size is normal. No increase in right ventricular wall thickness.  9. Left atrial size was mildly dilated. 10. Right atrial size was normal. 11. Mild mitral annular calcification. 12. The mitral valve is normal in structure. Mild to moderate mitral valve regurgitation. 13. The tricuspid valve is normal in structure. 14. The tricuspid valve is normal in structure. Tricuspid valve regurgitation is not demonstrated. 15. The aortic valve is normal in structure. Aortic valve regurgitation is not visualized. Mild aortic valve sclerosis without stenosis. 16. The pulmonic valve  was grossly normal. Pulmonic valve regurgitation is trivial. 17. TR signal is inadequate for assessing pulmonary artery systolic pressure. 18. The inferior vena cava is dilated in size with <50% respiratory variability, suggesting right atrial pressure of 15 mmHg. 19. No significant change from prior study. 20. Prior images reviewed side by side.  Patient Profile     86 y.o. female  with a hx of CAD s/p CABG  with patent LIMA-LAD and collaterals, chronic systolic heart failure, ischemic cardiomyopathy, paroxysmal atrial fibrillation on eliquis, HTN, CKD stage IV, DM2, COPD, PAD, Luetscher syndrome, and HLD who was seen for the evaluation of NSTEMI at the request of Dr. Denton Brick.  Assessment & Plan    1. NSTEMI: hsTn peaked at [address] in the setting of Afib RVR. Initial EKG showed ST depression in inferolateral leads, but resolved on subsequent tracings. Last dose of Eliquis 1/25, evening dose. Planned for cardiac cath today. No chest pain overnight.  2. PAF: remains on SR on telemetry. Eliquis held as above. On coreg 3.125mg  BID prior to admission, but has been on metoprolol q6hr here. Will transition back to coreg given low EF this evening as pressures are stable.  -- Started on maintenance dose of amiodarone this admission.   3. CKD stage 3: 1.2>>mild increase to 1.37 this morning. Has been receiving IVFs pre cath.   4. Chronic systolic HF: volume stable on exam. EF noted slightly lower this admission at 35-40%. Restarting coreg as above, no ARB/ACEi/spiro with renal dysfunction.   5. Hx of hypothyroidism: TSH levels are now low. Levothyroxine reduced to 40mcg daily. Management per primary.  For questions or updates, please contact [place] Please consult [email or web address] for contact info under        Signed, Reino Bellis, NP  08/01/2019, 8:52 AM

## 2019-08-02 LAB — BASIC METABOLIC PANEL
Anion gap: 7 (ref 5–15)
BUN: 36 mg/dL — ABNORMAL HIGH (ref 8–23)
CO2: 21 mmol/L — ABNORMAL LOW (ref 22–32)
Calcium: 9.3 mg/dL (ref 8.9–10.3)
Chloride: 109 mmol/L (ref 98–111)
Creatinine, Ser: 1.5 mg/dL — ABNORMAL HIGH (ref 0.44–1.00)
GFR calc Af Amer: 36 mL/min — ABNORMAL LOW (ref >60)
GFR calc non Af Amer: 31 mL/min — ABNORMAL LOW (ref >60)
Glucose, Bld: 243 mg/dL — ABNORMAL HIGH (ref 70–99)
Potassium: 4.2 mmol/L (ref 3.5–5.1)
Sodium: 137 mmol/L (ref 135–145)

## 2019-08-02 LAB — GLUCOSE, CAPILLARY
Glucose-Capillary: 186 mg/dL — ABNORMAL HIGH (ref 70–99)
Glucose-Capillary: 228 mg/dL — ABNORMAL HIGH (ref 70–99)

## 2019-08-02 MED ORDER — APIXABAN 2.5 MG PO TABS
2.5000 mg | ORAL_TABLET | Freq: Two times a day (BID) | ORAL | Status: DC
  Administered 2019-08-02: 2.5 mg via ORAL
  Filled 2019-08-02: qty 1

## 2019-08-02 MED ORDER — LEVOTHYROXINE SODIUM 50 MCG PO TABS: 50.0000 ug | ORAL_TABLET | Freq: Every day | ORAL | 2 refills | Status: DC

## 2019-08-02 MED ORDER — CARVEDILOL 6.25 MG PO TABS: 6.2500 mg | ORAL_TABLET | Freq: Two times a day (BID) | ORAL | 2 refills | Status: DC

## 2019-08-02 MED ORDER — POLYETHYLENE GLYCOL 3350 17 G PO PACK: 17.0000 g | PACK | Freq: Every day | ORAL | 0 refills | Status: DC | PRN

## 2019-08-02 MED ORDER — SACCHAROMYCES BOULARDII 250 MG PO CAPS: 250.0000 mg | ORAL_CAPSULE | Freq: Two times a day (BID) | ORAL | Status: DC

## 2019-08-02 MED ORDER — AMIODARONE HCL 200 MG PO TABS: 200.0000 mg | ORAL_TABLET | Freq: Every day | ORAL | 2 refills | Status: DC

## 2019-08-02 MED FILL — Verapamil HCl IV Soln 2.5 MG/ML: INTRAVENOUS | Qty: 2 | Status: AC

## 2019-08-02 NOTE — Discharge Summary (Signed)
Physician Discharge Summary  Debra [name] HDQ:[id number] DOB: [birth year] DOA: 07/30/2019  PCP: Sharilyn Sites, MD  Admit date: 07/30/2019 Discharge date: 08/02/2019  Admitted From: Home Disposition: Home Recommendations for Outpatient Follow-up:  1. Follow up with PCP in 1-2 weeks 2    Please obtain BMP/CBC in one week   Home Health: None  equipment/Devices: None  Discharge Condition: Stable and improved CODE STATUS: Full code  diet recommendation: Cardiac diet Brief/Interim Summary:86 y.o.femalewith medical history significant forhypertension, diabetes mellitus, rheumatoid arthritis, CKD 3,systolic heart failure, asthma and COPD,luetschers syndrome.CAD s/p CABG and prior stent of LCx. History of paroxysmal AFib on Eliquis.She presents to [facility], ED secondary to severe shortness of breath, she was noted to be in A. fib, with heart rate in the 150s, with low blood pressure, cardioversion attempted x2, which was unsuccessful, at did slow the heart rate, EKG noted with ST depression, and significantly elevated troponins, tarted on heparin drip for NSTEMI,transferred to [facility] for further management.  Discharge Diagnoses:  Active Problems:   Hypertension   Chronic renal insufficiency, stage 3 (moderate) (HCC)   Type 2 diabetes mellitus (HCC)   Non-ST elevation (NSTEMI) myocardial infarction [facility])   Atrial fibrillation with RVR (HCC)   Hypotension  #1 NSTEMI/A. fib RVR/chronic systolic CHF-she is status post cardiac catheter 08/01/2019.  Findings were similar to the prior cath in 2019.  Plan is to continue medical management. Eliquis restarted. Coreg 6.25 twice a day. Amiodarone 200 mg daily. Continue to hold Entresto until she follows up with cardiology as an outpatient. Her creatinine is trending up we will have her check her labs again on Monday, February 1.  #2 diarrhea improving C. difficile negative start Florastor.  Diarrhea could be related to  hyperthyroidism.  #3 history of hypothyroidism now hyperthyroid -Synthroid dose is again decreased to 75 MCG.   #4 type 2 diabetes continue Lantus.   #5 history of essential hypertension blood pressure 165/55 continue Coreg and amiodarone.  Entresto on hold.  #6 stage III CKD stable creatinine trending up we will follow up on Monday.  #7 history of COPD/asthma stable  #8 weight loss follow-up with GI with history of colon cancer, likely secondary to hyperthyroidism 2.      Nutrition Problem: Increased nutrient needs Etiology: chronic illness    Signs/Symptoms: estimated needs     Interventions: Ensure Enlive (each supplement provides 350kcal and 20 grams of protein), MVI  Estimated body mass index is 18.67 kg/m as calculated from the following:   Height as of this encounter: 5\' 7"  (1.702 m).   Weight as of this encounter: 54.1 kg.  Discharge Instructions  Discharge Instructions    Diet - low sodium heart healthy   Complete by: As directed    Increase activity slowly   Complete by: As directed      Allergies as of 08/02/2019      Reactions   Ciprofloxacin Rash, Itching   Iodine    Iodinated Diagnostic Agents Rash      Medication List    STOP taking these medications   Entresto 24-26 MG Generic drug: sacubitril-valsartan     TAKE these medications   acetaminophen 325 MG tablet Commonly known as: TYLENOL Take 650 mg by mouth every 6 (six) hours as needed for mild pain.   amiodarone 200 MG tablet Commonly known as: PACERONE Take 1 tablet (200 mg total) by mouth daily. Start taking on: August 03, 2019   apixaban 2.5 MG Tabs tablet Commonly known as:  ELIQUIS Take 1 tablet (2.5 mg total) by mouth 2 (two) times daily.   Aspercreme Max Roll-On 16 % Liqd Generic drug: Menthol (Topical Analgesic) Apply 1 application topically as needed.   atorvastatin 80 MG tablet Commonly known as: LIPITOR Take 1 tablet (80 mg total) by mouth daily at 6 PM.    carvedilol 6.25 MG tablet Commonly known as: COREG Take 1 tablet (6.25 mg total) by mouth 2 (two) times daily with a meal. What changed:   medication strength  how much to take   diazepam 2 MG tablet Commonly known as: VALIUM Take 2 mg by mouth daily as needed.   Fish Oil 1000 MG Caps Take 1 tablet by mouth daily.   gabapentin 100 MG capsule Commonly known as: NEURONTIN Take 100 mg by mouth 3 (three) times daily.   HYDROcodone-acetaminophen 10-325 MG tablet Commonly known as: NORCO Take 1 tablet by mouth every 6 (six) hours as needed for moderate pain or severe pain. 1 tab po hs/ and prn.   insulin glargine 100 unit/mL Sopn Commonly known as: LANTUS Inject 0.1 mLs (10 Units total) into the skin at bedtime.   levothyroxine 50 MCG tablet Commonly known as: SYNTHROID Take 1 tablet (50 mcg total) by mouth daily at 6 (six) AM. Start taking on: August 03, 2019 What changed:   medication strength  how much to take  when to take this   pantoprazole 40 MG tablet Commonly known as: PROTONIX Take 40 mg by mouth daily.   polyethylene glycol 17 g packet Commonly known as: MIRALAX / GLYCOLAX Take 17 g by mouth daily as needed for mild constipation.   rOPINIRole 2 MG tablet Commonly known as: REQUIP Take 2 mg by mouth at bedtime.   saccharomyces boulardii 250 MG capsule Commonly known as: FLORASTOR Take 1 capsule (250 mg total) by mouth 2 (two) times daily.   Salonpas Lidocaine Plus 4-10 % Liqd Generic drug: Lidocaine HCl-Benzyl Alcohol Apply 1 application topically as needed.   Ventolin HFA 108 (90 Base) MCG/ACT inhaler Generic drug: albuterol Inhale 1 puff into the lungs every 6 (six) hours as needed for shortness of breath or wheezing.   Vitamin D 125 MCG (5000 UT) Caps Take 1 capsule by mouth daily.      Follow-up Information    Imogene Burn, PA-C Follow up on 08/13/2019.   Specialty: Cardiology Why: ar 12:30pm for your follow up appt.  Contact  information: [place] [address] [phone]        Sharilyn Sites, MD Follow up.   Specialty: Family Medicine Contact information: [address] [place] [address] [phone]        [name], MD .   Specialty: Cardiology Contact information: [place] [address] [phone]          Allergies  Allergen Reactions  . Ciprofloxacin Rash and Itching  . Iodine   . Iodinated Diagnostic Agents Rash    Consultations: Cardiology  Procedures/Studies: CARDIAC CATHETERIZATION  Result Date: 08/01/2019  Ost LAD to Prox LAD lesion is 100% stenosed.  Ost 1st Mrg to 1st Mrg lesion is 40% stenosed.  Ost 2nd Mrg lesion is 30% stenosed.  Ost 3rd Mrg lesion is 90% stenosed.  Ost RCA to Dist RCA lesion is 100% stenosed.  LIMA and is normal in caliber.  The graft exhibits no disease.  SVG.  Origin to Prox Graft lesion is 100% stenosed.  SVG due to known occlusion.  Origin  to Prox Graft lesion is 100% stenosed.  Prox Cx to Mid Cx lesion is 10% stenosed.  1. Triple vessel CAD s/p CABG with 1 patent bypass graft 2. Occluded proximal LAD. Patent LIMA to mid LAD 3. Known occlusion of the SVG to OM. The mid Circumflex stent is patent. Severe disease in a small caliber OM branch that fills from left to left collaterals. Unchanged from last cath. 4. Chronic occlusion of the proximal RCA. Known occlusion of the vein graft to the distal RCA. The distal RCA fills from left to right collaterals. Recommendations: Coronary disease unchanged from last cath in 2019. Continue medical management of CAD.   DG Chest Portable 1 View  Result Date: 07/30/2019 CLINICAL DATA:  Generalized weakness with nausea and dry heaving. EXAM: PORTABLE CHEST 1 VIEW COMPARISON:  03/21/2019 FINDINGS: Lungs are hyperexpanded. Interstitial markings are diffusely coarsened with chronic features. Staple line noted right upper lung. Cardiopericardial silhouette is at  upper limits of normal for size. The visualized bony structures of the thorax are intact. Telemetry leads overlie the chest. IMPRESSION: Cardiomegaly with emphysema.  No acute cardiopulmonary findings. Electronically Signed   By: Misty Stanley M.D.   On: 07/30/2019 12:56   ECHOCARDIOGRAM COMPLETE  Result Date: 07/31/2019   ECHOCARDIOGRAM REPORT   Patient Name:   LORYN HAACKE Date of Exam: 07/31/2019 Medical Rec #:  [id number]     Height:       67.0 in Accession #:    [id number]    Weight:       119.2 lb Date of Birth:  1928/07/13     BSA:          1.62 m Patient Age:    18 years      BP:           127/66 mmHg Patient Gender: F             HR:           68 bpm. Exam Location:  Inpatient Procedure: 2D Echo Indications:    Dyspnea 786.09 / R06.00  History:        Patient has prior history of Echocardiogram examinations, most                 recent 07/21/2018. CHF, NSTEMI and CAD, COPD, Arrythmias:Atrial                 Fibrillation, Signs/Symptoms:Hypotension; Risk Factors:Diabetes,                 Hypertension and Dyslipidemia.  Sonographer:    Vikki Ports Turrentine Referring Phys: [id number] [place]  1. Left ventricular ejection fraction, by visual estimation, is 35 to 40%. The left ventricle has moderately decreased function. There is no left ventricular hypertrophy.  2. Mild dyskinesis of the left ventricular, basal-mid inferoseptal wall and inferior wall (consistent with previous infarction in the right coronary artery distribution).  3. Severe hypokinesis of the left ventricular, basal anteroseptal wall. The apical segments contract normally (consistent with occlusion of the proximal LAD artery with patent graft to the distal vessel).  4. Abnormal septal motion consistent with post-operative status.  5. Elevated left atrial pressure.  6. Left ventricular diastolic parameters are consistent with Grade II diastolic dysfunction (pseudonormalization).  7. The left ventricle demonstrates regional  wall motion abnormalities.  8. Global right ventricle has normal systolic function.The right ventricular size is normal. No increase in right ventricular wall thickness.  9. Left atrial size was mildly dilated.  10. Right atrial size was normal. 11. Mild mitral annular calcification. 12. The mitral valve is normal in structure. Mild to moderate mitral valve regurgitation. 13. The tricuspid valve is normal in structure. 14. The tricuspid valve is normal in structure. Tricuspid valve regurgitation is not demonstrated. 15. The aortic valve is normal in structure. Aortic valve regurgitation is not visualized. Mild aortic valve sclerosis without stenosis. 16. The pulmonic valve was grossly normal. Pulmonic valve regurgitation is trivial. 17. TR signal is inadequate for assessing pulmonary artery systolic pressure. 18. The inferior vena cava is dilated in size with <50% respiratory variability, suggesting right atrial pressure of 15 mmHg. 19. No significant change from prior study. 20. Prior images reviewed side by side. FINDINGS  Left Ventricle: Left ventricular ejection fraction, by visual estimation, is 35 to 40%. The left ventricle has moderately decreased function. Mild dyskinesis of the left ventricular, basal-mid inferoseptal wall and inferior wall. Severe hypokinesis of the left ventricular, basal anteroseptal wall. The left ventricle demonstrates regional wall motion abnormalities. The left ventricular internal cavity size was the left ventricle is normal in size. There is no left ventricular hypertrophy. Abnormal (paradoxical) septal motion consistent with post-operative status. Left ventricular diastolic parameters are consistent with Grade II diastolic dysfunction (pseudonormalization). Elevated left atrial pressure. Right Ventricle: The right ventricular size is normal. No increase in right ventricular wall thickness. Global RV systolic function is has normal systolic function. Left Atrium: Left atrial size  was mildly dilated. Right Atrium: Right atrial size was normal in size Pericardium: There is no evidence of pericardial effusion. Mitral Valve: The mitral valve is normal in structure. Mild mitral annular calcification. Mild to moderate mitral valve regurgitation, with centrally-directed jet. Tricuspid Valve: The tricuspid valve is normal in structure. Tricuspid valve regurgitation is not demonstrated. Aortic Valve: The aortic valve is normal in structure. Aortic valve regurgitation is not visualized. Mild aortic valve sclerosis is present, with no evidence of aortic valve stenosis. Pulmonic Valve: The pulmonic valve was grossly normal. Pulmonic valve regurgitation is trivial. Pulmonic regurgitation is trivial. Aorta: The aortic root and ascending aorta are structurally normal, with no evidence of dilitation. Venous: The inferior vena cava is dilated in size with less than 50% respiratory variability, suggesting right atrial pressure of 15 mmHg. IAS/Shunts: No atrial level shunt detected by color flow Doppler.  LEFT VENTRICLE PLAX 2D LVIDd:         4.40 cm       Diastology LVIDs:         3.60 cm       LV e' lateral:   7.65 cm/s LV PW:         0.70 cm       LV E/e' lateral: 15.8 LV IVS:        0.70 cm       LV e' medial:    3.30 cm/s LVOT diam:     1.70 cm       LV E/e' medial:  36.7 LV SV:         33 ml LV SV Index:   20.92 LVOT Area:     2.27 cm  LV Volumes (MOD) LV area d, A2C:    28.90 cm LV area d, A4C:    26.30 cm LV area s, A2C:    21.00 cm LV area s, A4C:    21.00 cm LV major d, A2C:   7.08 cm LV major d, A4C:   6.97 cm LV major s, A2C:   6.07  cm LV major s, A4C:   6.70 cm LV vol d, MOD A2C: 99.3 ml LV vol d, MOD A4C: 83.4 ml LV vol s, MOD A2C: 64.5 ml LV vol s, MOD A4C: 57.1 ml LV SV MOD A2C:     34.8 ml LV SV MOD A4C:     83.4 ml LV SV MOD BP:      27.9 ml RIGHT VENTRICLE RV S prime:     13.50 cm/s TAPSE (M-mode): 1.4 cm LEFT ATRIUM             Index       RIGHT ATRIUM           Index LA diam:         4.00 cm 2.46 cm/m  RA Area:     19.90 cm LA Vol (A2C):   66.6 ml 41.04 ml/m RA Volume:   57.40 ml  35.37 ml/m LA Vol (A4C):   50.9 ml 31.36 ml/m LA Biplane Vol: 64.5 ml 39.74 ml/m  AORTIC VALVE LVOT Vmax:   109.00 cm/s LVOT Vmean:  68.900 cm/s LVOT VTI:    0.241 m  AORTA Ao Root diam: 2.70 cm MITRAL VALVE MV Area (PHT): 3.53 cm              SHUNTS MV PHT:        62.35 msec            Systemic VTI:  0.24 m MV Decel Time: 215 msec              Systemic Diam: 1.70 cm MV E velocity: 121.00 cm/s 103 cm/s MV A velocity: 79.30 cm/s  70.3 cm/s MV E/A ratio:  1.53        1.5  Mihai Croitoru MD Electronically signed by Sanda Klein MD Signature Date/Time: 07/31/2019/3:01:06 PM    Final     (Echo, Carotid, EGD, Colonoscopy, ERCP)    Subjective: She feels well has had no diarrhea today had a small loose bowel movement yesterday no nausea vomiting   Discharge Exam: Vitals:   08/02/19 0800 08/02/19 0900  BP:    Pulse:    Resp: (!) 21 (!) 22  Temp:    SpO2:     Vitals:   08/01/19 1800 08/01/19 2031 08/02/19 0800 08/02/19 0900  BP: (!) 149/70 139/75    Pulse: 78 75    Resp:  19 (!) 21 (!) 22  Temp:  97.7 F (36.5 C)    TempSrc:  Oral    SpO2:  97%    Weight:      Height:        General: Pt is alert, awake, not in acute distress Cardiovascular: RRR, S1/S2 +, no rubs, no gallops Respiratory: CTA bilaterally, no wheezing, no rhonchi Abdominal: Soft, NT, ND, bowel sounds + Extremities: no edema, no cyanosis    The results of significant diagnostics from this hospitalization (including imaging, microbiology, ancillary and laboratory) are listed below for reference.     Microbiology: Recent Results (from the past 240 hour(s))  Respiratory Panel by RT PCR (Flu A&B, Covid) - Nasopharyngeal Swab     Status: None   Collection Time: 07/30/19  3:57 PM   Specimen: Nasopharyngeal Swab  Result Value Ref Range Status   SARS Coronavirus 2 by RT PCR NEGATIVE NEGATIVE Final    Comment:  (NOTE) SARS-CoV-2 target nucleic acids are NOT DETECTED. The SARS-CoV-2 RNA is generally detectable in upper respiratoy specimens during the acute phase of infection. The  lowest concentration of SARS-CoV-2 viral copies this assay can detect is 131 copies/mL. A negative result does not preclude SARS-Cov-2 infection and should not be used as the sole basis for treatment or other patient management decisions. A negative result may occur with  improper specimen collection/handling, submission of specimen other than nasopharyngeal swab, presence of viral mutation(s) within the areas targeted by this assay, and inadequate number of viral copies (<131 copies/mL). A negative result must be combined with clinical observations, patient history, and epidemiological information. The expected result is Negative. Fact Sheet for Patients:  PinkCheek.be Fact Sheet for Healthcare Providers:  GravelBags.it This test is not yet ap proved or cleared by the [place] FDA and  has been authorized for detection and/or diagnosis of SARS-CoV-2 by FDA under an Emergency Use Authorization (EUA). This EUA will remain  in effect (meaning this test can be used) for the duration of the COVID-19 declaration under Section 564(b)(1) of the Act, 21 U.S.C. section 360bbb-3(b)(1), unless the authorization is terminated or revoked sooner.    Influenza A by PCR NEGATIVE NEGATIVE Final   Influenza B by PCR NEGATIVE NEGATIVE Final    Comment: (NOTE) The Xpert Xpress SARS-CoV-2/FLU/RSV assay is intended as an aid in  the diagnosis of influenza from Nasopharyngeal swab specimens and  should not be used as a sole basis for treatment. Nasal washings and  aspirates are unacceptable for Xpert Xpress SARS-CoV-2/FLU/RSV  testing. Fact Sheet for Patients: PinkCheek.be Fact Sheet for Healthcare  Providers: GravelBags.it This test is not yet approved or cleared by the [place] FDA and  has been authorized for detection and/or diagnosis of SARS-CoV-2 by  FDA under an Emergency Use Authorization (EUA). This EUA will remain  in effect (meaning this test can be used) for the duration of the  Covid-19 declaration under Section 564(b)(1) of the Act, 21  U.S.C. section 360bbb-3(b)(1), unless the authorization is  terminated or revoked. Performed at [facility], [address], [place], [place] [address]   MRSA PCR Screening     Status: None   Collection Time: 07/30/19  8:09 PM   Specimen: Nasal Mucosa; Nasopharyngeal  Result Value Ref Range Status   MRSA by PCR NEGATIVE NEGATIVE Final    Comment:        The GeneXpert MRSA Assay (FDA approved for NASAL specimens only), is one component of a comprehensive MRSA colonization surveillance program. It is not intended to diagnose MRSA infection nor to guide or monitor treatment for MRSA infections. Performed at [facility], [address] [place], [place] [address]   C difficile quick scan w PCR reflex     Status: None   Collection Time: 07/31/19  4:20 PM   Specimen: STOOL  Result Value Ref Range Status   C Diff antigen NEGATIVE NEGATIVE Final   C Diff toxin NEGATIVE NEGATIVE Final   C Diff interpretation No C. difficile detected.  Final    Comment: Performed at [facility], [address], [place], [place] [address]     Labs: BNP (last 3 results) No results for input(s): BNP in the last 8760 hours. Basic Metabolic Panel: Recent Labs  Lab 07/30/19 1343 07/31/19 0207 08/01/19 0101 08/02/19 0934  NA 138 139 141 137  K 3.6 4.8 4.7 4.2  CL 107 111 111 109  CO2 23 23 20* 21*  GLUCOSE 103* 139* 154* 243*  BUN 27* 28* 28* 36*  CREATININE 1.29* 1.27* 1.37* 1.50*  CALCIUM 8.7* 8.5* 9.3 9.3  MG  1.7  --  2.0  --    Liver Function Tests: Recent Labs  Lab 07/30/19 1343  AST 17  ALT  13  ALKPHOS 60  BILITOT 0.8  PROT 5.5*  ALBUMIN 3.1*   Recent Labs  Lab 07/30/19 1343  LIPASE 21   No results for input(s): AMMONIA in the last 168 hours. CBC: Recent Labs  Lab 07/30/19 1343 07/31/19 0207 08/01/19 0101  WBC 7.5 6.1 8.3  NEUTROABS 6.1  --   --   HGB 11.6* 10.7* 11.0*  HCT 36.9 35.5* 34.1*  MCV 104.8* 110.2* 100.9*  PLT 222 215 207   Cardiac Enzymes: No results for input(s): CKTOTAL, CKMB, CKMBINDEX, TROPONINI in the last 168 hours. BNP: Invalid input(s): POCBNP CBG: Recent Labs  Lab 08/01/19 0815 08/01/19 1157 08/01/19 1648 08/01/19 2110 08/02/19 0802  GLUCAP 204* 197* 324* 284* 186*   D-Dimer No results for input(s): DDIMER in the last 72 hours. Hgb A1c Recent Labs    07/30/19 1343  HGBA1C 6.3*   Lipid Profile No results for input(s): CHOL, HDL, LDLCALC, TRIG, CHOLHDL, LDLDIRECT in the last 72 hours. Thyroid function studies Recent Labs    07/30/19 1343 07/30/19 1514  TSH 0.019*  --   T3FREE  --  3.1   Anemia work up No results for input(s): VITAMINB12, FOLATE, FERRITIN, TIBC, IRON, RETICCTPCT in the last 72 hours. Urinalysis    Component Value Date/Time   COLORURINE YELLOW 07/30/2019 1700   APPEARANCEUR CLEAR 07/30/2019 1700   LABSPEC 1.013 07/30/2019 1700   PHURINE 5.0 07/30/2019 1700   GLUCOSEU NEGATIVE 07/30/2019 1700   HGBUR NEGATIVE 07/30/2019 1700   BILIRUBINUR NEGATIVE 07/30/2019 1700   KETONESUR NEGATIVE 07/30/2019 1700   PROTEINUR 30 (A) 07/30/2019 1700   NITRITE NEGATIVE 07/30/2019 1700   LEUKOCYTESUR SMALL (A) 07/30/2019 1700   Sepsis Labs Invalid input(s): PROCALCITONIN,  WBC,  LACTICIDVEN Microbiology Recent Results (from the past 240 hour(s))  Respiratory Panel by RT PCR (Flu A&B, Covid) - Nasopharyngeal Swab     Status: None   Collection Time: 07/30/19  3:57 PM   Specimen: Nasopharyngeal Swab  Result Value Ref Range Status   SARS Coronavirus 2 by RT PCR NEGATIVE NEGATIVE Final    Comment:  (NOTE) SARS-CoV-2 target nucleic acids are NOT DETECTED. The SARS-CoV-2 RNA is generally detectable in upper respiratoy specimens during the acute phase of infection. The lowest concentration of SARS-CoV-2 viral copies this assay can detect is 131 copies/mL. A negative result does not preclude SARS-Cov-2 infection and should not be used as the sole basis for treatment or other patient management decisions. A negative result may occur with  improper specimen collection/handling, submission of specimen other than nasopharyngeal swab, presence of viral mutation(s) within the areas targeted by this assay, and inadequate number of viral copies (<131 copies/mL). A negative result must be combined with clinical observations, patient history, and epidemiological information. The expected result is Negative. Fact Sheet for Patients:  PinkCheek.be Fact Sheet for Healthcare Providers:  GravelBags.it This test is not yet ap proved or cleared by the [place] FDA and  has been authorized for detection and/or diagnosis of SARS-CoV-2 by FDA under an Emergency Use Authorization (EUA). This EUA will remain  in effect (meaning this test can be used) for the duration of the COVID-19 declaration under Section 564(b)(1) of the Act, 21 U.S.C. section 360bbb-3(b)(1), unless the authorization is terminated or revoked sooner.    Influenza A by PCR NEGATIVE NEGATIVE Final   Influenza B by  PCR NEGATIVE NEGATIVE Final    Comment: (NOTE) The Xpert Xpress SARS-CoV-2/FLU/RSV assay is intended as an aid in  the diagnosis of influenza from Nasopharyngeal swab specimens and  should not be used as a sole basis for treatment. Nasal washings and  aspirates are unacceptable for Xpert Xpress SARS-CoV-2/FLU/RSV  testing. Fact Sheet for Patients: PinkCheek.be Fact Sheet for Healthcare  Providers: GravelBags.it This test is not yet approved or cleared by the [place] FDA and  has been authorized for detection and/or diagnosis of SARS-CoV-2 by  FDA under an Emergency Use Authorization (EUA). This EUA will remain  in effect (meaning this test can be used) for the duration of the  Covid-19 declaration under Section 564(b)(1) of the Act, 21  U.S.C. section 360bbb-3(b)(1), unless the authorization is  terminated or revoked. Performed at [facility], [address] [place], [place] [address]   MRSA PCR Screening     Status: None   Collection Time: 07/30/19  8:09 PM   Specimen: Nasal Mucosa; Nasopharyngeal  Result Value Ref Range Status   MRSA by PCR NEGATIVE NEGATIVE Final    Comment:        The GeneXpert MRSA Assay (FDA approved for NASAL specimens only), is one component of a comprehensive MRSA colonization surveillance program. It is not intended to diagnose MRSA infection nor to guide or monitor treatment for MRSA infections. Performed at [facility], [address] [place], [place] [address]   C difficile quick scan w PCR reflex     Status: None   Collection Time: 07/31/19  4:20 PM   Specimen: STOOL  Result Value Ref Range Status   C Diff antigen NEGATIVE NEGATIVE Final   C Diff toxin NEGATIVE NEGATIVE Final   C Diff interpretation No C. difficile detected.  Final    Comment: Performed at [facility], [place] [address] [place], [place] [address]     Time coordinating discharge: 39 minutes  SIGNED:   Georgette Shell, MD  [facility] 08/02/2019, 11:52 AM Pager   If 7PM-7AM, please contact night-coverage [email or web address] Password [email or web address]

## 2019-08-02 NOTE — Discharge Instructions (Signed)

## 2019-08-02 NOTE — Evaluation (Addendum)
Physical Therapy Evaluation Patient Details Name: Debra [name] MRN: [id number] DOB: [birth year] Today's Date: 08/02/2019   History of Present Illness  Pt is an 86 y/o female admitted secondary to SOB and hypotension. Pt found to have NSTEMI in setting of a fib with RVR. Pt is s/p cardiac cath that showed CAD consistent with a previous cath; to be managed medically. PMH includes HTN, DM, and COPD.   Clinical Impression  Pt admitted secondary to problem above with deficits below. Pt required min guard to min A with hand held assist this session secondary to unsteadiness. Educated about using walker at home to increase stability. Pt reports she does not want HHPT. Reports she has help from her daughter at home. Will continue to follow acutely to maximize functional mobility independence and safety.     Follow Up Recommendations No PT Follow up;Supervision for mobility/OOB(pt refusing HHPT )    Equipment Recommendations  None recommended by PT    Recommendations for Other Services       Precautions / Restrictions Precautions Precautions: Fall Restrictions Weight Bearing Restrictions: No      Mobility  Bed Mobility Overal bed mobility: Modified Independent                Transfers Overall transfer level: Needs assistance Equipment used: None Transfers: Sit to/from Stand Sit to Stand: Min guard         General transfer comment: Min guard for safety.   Ambulation/Gait Ambulation/Gait assistance: Min guard;Min assist Gait Distance (Feet): 120 Feet Assistive device: 1 person hand held assist Gait Pattern/deviations: Step-through pattern;Decreased stride length;Drifts right/left Gait velocity: Decreased   General Gait Details: Slow, mildly unsteady gait without AD. Pt requiring min to min guard A for steadying assist. Educated about using walker at home to improve safety.   Stairs            Wheelchair Mobility    Modified Rankin (Stroke Patients Only)        Balance Overall balance assessment: Needs assistance Sitting-balance support: No upper extremity supported;Feet supported Sitting balance-Leahy Scale: Good     Standing balance support: Single extremity supported;During functional activity;No upper extremity supported Standing balance-Leahy Scale: Fair Standing balance comment: Able to maintain static standing without UE support                              Pertinent Vitals/Pain Pain Assessment: No/denies pain    Home Living Family/patient expects to be discharged to:: Private residence Living Arrangements: Children Available Help at Discharge: Family;Available 24 hours/day Type of Home: House Home Access: Ramped entrance     Home Layout: One level Home Equipment: Walker - 4 wheels;Cane - single point;Wheelchair - manual      Prior Function Level of Independence: Needs assistance   Gait / Transfers Assistance Needed: Reports using cane for ambulation. Reports daughter usually provides supervision  ADL's / Homemaking Assistance Needed: Reports independence with bathing and dressing, however, reports daughter provides supervision when pt gets into and out of tub.         Hand Dominance        Extremity/Trunk Assessment   Upper Extremity Assessment Upper Extremity Assessment: Generalized weakness    Lower Extremity Assessment Lower Extremity Assessment: Generalized weakness    Cervical / Trunk Assessment Cervical / Trunk Assessment: Kyphotic  Communication   Communication: No difficulties  Cognition Arousal/Alertness: Awake/alert Behavior During Therapy: WFL for tasks assessed/performed Overall Cognitive Status: Within  Functional Limits for tasks assessed                                        General Comments      Exercises     Assessment/Plan    PT Assessment Patient needs continued PT services  PT Problem List Decreased strength;Decreased balance;Decreased  mobility;Decreased knowledge of use of DME       PT Treatment Interventions Gait training;DME instruction;Functional mobility training;Therapeutic activities;Therapeutic exercise;Balance training;Patient/family education    PT Goals (Current goals can be found in the Care Plan section)  Acute Rehab PT Goals Patient Stated Goal: to go home PT Goal Formulation: With patient Time For Goal Achievement: 08/16/19 Potential to Achieve Goals: Good    Frequency Min 3X/week   Barriers to discharge        Co-evaluation               AM-PAC PT "6 Clicks" Mobility  Outcome Measure Help needed turning from your back to your side while in a flat bed without using bedrails?: None Help needed moving from lying on your back to sitting on the side of a flat bed without using bedrails?: None Help needed moving to and from a bed to a chair (including a wheelchair)?: A Little Help needed standing up from a chair using your arms (e.g., wheelchair or bedside chair)?: A Little Help needed to walk in hospital room?: A Little Help needed climbing 3-5 steps with a railing? : A Lot 6 Click Score: 19    End of Session Equipment Utilized During Treatment: Gait belt Activity Tolerance: Patient tolerated treatment well Patient left: in bed;with call bell/phone within reach(sitting EOB ) Nurse Communication: Mobility status PT Visit Diagnosis: Unsteadiness on feet (R26.81);Muscle weakness (generalized) (M62.81)    Time: 5784-6962 PT Time Calculation (min) (ACUTE ONLY): 15 min   Charges:   PT Evaluation $PT Eval Low Complexity: 1 Low          Lou Miner, DPT  Acute Rehabilitation Services  Pager: [phone] Office: [phone]   Rudean Hitt 08/02/2019, 12:56 PM

## 2019-08-02 NOTE — Care Management (Signed)
1655 08-02-19 Patient was discharged home today. Per patient the staff RN spoke with the patient and she did not want home health physical therapy. The daughter called the staff nurse back asking for Physical Therapy- order placed in Epic and referral sent to [facility] for Services. Start of care to begin on Monday at the earliest. No further needs at this time. Bethena Roys , RN,BSN Case Manager [phone]

## 2019-08-02 NOTE — Progress Notes (Signed)
Progress Note  Patient Name: Debra [name] Date of Encounter: 08/02/2019   Primary Cardiologist: Rozann Lesches, MD   Subjective   Feeling well this morning. Diarrhea has resolved. No chest pain.   Inpatient Medications    Scheduled Meds: . amiodarone  200 mg Oral Daily  . atorvastatin  80 mg Oral q1800  . carvedilol  6.25 mg Oral BID WC  . feeding supplement (ENSURE ENLIVE)  237 mL Oral BID BM  . gabapentin  100 mg Oral TID  . insulin aspart  0-5 Units Subcutaneous QHS  . insulin aspart  0-9 Units Subcutaneous TID WC  . levothyroxine  50 mcg Oral Q0600  . multivitamin with minerals  1 tablet Oral Daily  . pantoprazole  40 mg Oral Daily  . saccharomyces boulardii  250 mg Oral BID  . sodium chloride flush  3 mL Intravenous Q12H   Continuous Infusions: . sodium chloride 50 mL/hr at 08/02/19 0700  . sodium chloride     PRN Meds: sodium chloride, acetaminophen **OR** acetaminophen, albuterol, diazepam, ondansetron **OR** ondansetron (ZOFRAN) IV, polyethylene glycol, sodium chloride flush   Vital Signs    Vitals:   08/01/19 1115 08/01/19 1120 08/01/19 1800 08/01/19 2031  BP: (!) 155/64 (!) 158/65 (!) 149/70 139/75  Pulse: (!) 51 (!) 57 78 75  Resp: 14 18  19   Temp:    97.7 F (36.5 C)  TempSrc:    Oral  SpO2: 100% 100%  97%  Weight:      Height:        Intake/Output Summary (Last 24 hours) at 08/02/2019 0813 Last data filed at 08/02/2019 0700 Gross per 24 hour  Intake 1848.63 ml  Output --  Net 1848.63 ml   Last 3 Weights 07/31/2019 07/30/2019 07/30/2019  Weight (lbs) 119 lb 3.2 oz 119 lb 7.8 oz 119 lb  Weight (kg) 54.069 kg 54.2 kg 53.978 kg      Telemetry    SB - Personally Reviewed  ECG    No new tracing this morning.   Physical Exam  Pleasant older WF GEN: No acute distress.   Neck: No JVD Cardiac: RRR, no murmurs, rubs, or gallops.  Respiratory: Clear to auscultation bilaterally. GI: Soft, nontender, non-distended  MS: No edema; No deformity.  Right femoral cath site stable.  Neuro:  Nonfocal  Psych: Normal affect   Labs    High Sensitivity Troponin:   Recent Labs  Lab 07/30/19 1514 07/30/19 2003 07/30/19 2347 07/31/19 0207 07/31/19 0622  TROPONINIHS 1,268* 3,614* 10,235* 10,828* 8,434*      Chemistry Recent Labs  Lab 07/30/19 1343 07/31/19 0207 08/01/19 0101  NA 138 139 141  K 3.6 4.8 4.7  CL 107 111 111  CO2 23 23 20*  GLUCOSE 103* 139* 154*  BUN 27* 28* 28*  CREATININE 1.29* 1.27* 1.37*  CALCIUM 8.7* 8.5* 9.3  PROT 5.5*  --   --   ALBUMIN 3.1*  --   --   AST 17  --   --   ALT 13  --   --   ALKPHOS 60  --   --   BILITOT 0.8  --   --   GFRNONAA 37* 38* 34*  GFRAA 43* 44* 40*  ANIONGAP 8 5 10      Hematology Recent Labs  Lab 07/30/19 1343 07/31/19 0207 08/01/19 0101  WBC 7.5 6.1 8.3  RBC 3.52* 3.22* 3.38*  HGB 11.6* 10.7* 11.0*  HCT 36.9 35.5* 34.1*  MCV 104.8* 110.2*  100.9*  MCH 33.0 33.2 32.5  MCHC 31.4 30.1 32.3  RDW 15.6* 15.6* 15.7*  PLT 222 215 207    BNPNo results for input(s): BNP, PROBNP in the last 168 hours.   DDimer No results for input(s): DDIMER in the last 168 hours.   Radiology    CARDIAC CATHETERIZATION  Result Date: 08/01/2019  Ost LAD to Prox LAD lesion is 100% stenosed.  Ost 1st Mrg to 1st Mrg lesion is 40% stenosed.  Ost 2nd Mrg lesion is 30% stenosed.  Ost 3rd Mrg lesion is 90% stenosed.  Ost RCA to Dist RCA lesion is 100% stenosed.  LIMA and is normal in caliber.  The graft exhibits no disease.  SVG.  Origin to Prox Graft lesion is 100% stenosed.  SVG due to known occlusion.  Origin to Prox Graft lesion is 100% stenosed.  Prox Cx to Mid Cx lesion is 10% stenosed.  1. Triple vessel CAD s/p CABG with 1 patent bypass graft 2. Occluded proximal LAD. Patent LIMA to mid LAD 3. Known occlusion of the SVG to OM. The mid Circumflex stent is patent. Severe disease in a small caliber OM branch that fills from left to left collaterals. Unchanged from last cath. 4.  Chronic occlusion of the proximal RCA. Known occlusion of the vein graft to the distal RCA. The distal RCA fills from left to right collaterals. Recommendations: Coronary disease unchanged from last cath in 2019. Continue medical management of CAD.   ECHOCARDIOGRAM COMPLETE  Result Date: 07/31/2019   ECHOCARDIOGRAM REPORT   Patient Name:   Debra [name] Date of Exam: 07/31/2019 Medical Rec #:  [id number]     Height:       67.0 in Accession #:    [id number]    Weight:       119.2 lb Date of Birth:  Aug 16, 1928     BSA:          1.62 m Patient Age:    86 years      BP:           127/66 mmHg Patient Gender: F             HR:           68 bpm. Exam Location:  Inpatient Procedure: 2D Echo Indications:    Dyspnea 786.09 / R06.00  History:        Patient has prior history of Echocardiogram examinations, most                 recent 07/21/2018. CHF, NSTEMI and CAD, COPD, Arrythmias:Atrial                 Fibrillation, Signs/Symptoms:Hypotension; Risk Factors:Diabetes,                 Hypertension and Dyslipidemia.  Sonographer:    Vikki Ports Turrentine Referring Phys: [id number] [place]  1. Left ventricular ejection fraction, by visual estimation, is 35 to 40%. The left ventricle has moderately decreased function. There is no left ventricular hypertrophy.  2. Mild dyskinesis of the left ventricular, basal-mid inferoseptal wall and inferior wall (consistent with previous infarction in the right coronary artery distribution).  3. Severe hypokinesis of the left ventricular, basal anteroseptal wall. The apical segments contract normally (consistent with occlusion of the proximal LAD artery with patent graft to the distal vessel).  4. Abnormal septal motion consistent with post-operative status.  5. Elevated left atrial pressure.  6. Left ventricular diastolic parameters are consistent with  Grade II diastolic dysfunction (pseudonormalization).  7. The left ventricle demonstrates regional wall motion  abnormalities.  8. Global right ventricle has normal systolic function.The right ventricular size is normal. No increase in right ventricular wall thickness.  9. Left atrial size was mildly dilated. 10. Right atrial size was normal. 11. Mild mitral annular calcification. 12. The mitral valve is normal in structure. Mild to moderate mitral valve regurgitation. 13. The tricuspid valve is normal in structure. 14. The tricuspid valve is normal in structure. Tricuspid valve regurgitation is not demonstrated. 15. The aortic valve is normal in structure. Aortic valve regurgitation is not visualized. Mild aortic valve sclerosis without stenosis. 16. The pulmonic valve was grossly normal. Pulmonic valve regurgitation is trivial. 17. TR signal is inadequate for assessing pulmonary artery systolic pressure. 18. The inferior vena cava is dilated in size with <50% respiratory variability, suggesting right atrial pressure of 15 mmHg. 19. No significant change from prior study. 20. Prior images reviewed side by side. FINDINGS  Left Ventricle: Left ventricular ejection fraction, by visual estimation, is 35 to 40%. The left ventricle has moderately decreased function. Mild dyskinesis of the left ventricular, basal-mid inferoseptal wall and inferior wall. Severe hypokinesis of the left ventricular, basal anteroseptal wall. The left ventricle demonstrates regional wall motion abnormalities. The left ventricular internal cavity size was the left ventricle is normal in size. There is no left ventricular hypertrophy. Abnormal (paradoxical) septal motion consistent with post-operative status. Left ventricular diastolic parameters are consistent with Grade II diastolic dysfunction (pseudonormalization). Elevated left atrial pressure. Right Ventricle: The right ventricular size is normal. No increase in right ventricular wall thickness. Global RV systolic function is has normal systolic function. Left Atrium: Left atrial size was mildly  dilated. Right Atrium: Right atrial size was normal in size Pericardium: There is no evidence of pericardial effusion. Mitral Valve: The mitral valve is normal in structure. Mild mitral annular calcification. Mild to moderate mitral valve regurgitation, with centrally-directed jet. Tricuspid Valve: The tricuspid valve is normal in structure. Tricuspid valve regurgitation is not demonstrated. Aortic Valve: The aortic valve is normal in structure. Aortic valve regurgitation is not visualized. Mild aortic valve sclerosis is present, with no evidence of aortic valve stenosis. Pulmonic Valve: The pulmonic valve was grossly normal. Pulmonic valve regurgitation is trivial. Pulmonic regurgitation is trivial. Aorta: The aortic root and ascending aorta are structurally normal, with no evidence of dilitation. Venous: The inferior vena cava is dilated in size with less than 50% respiratory variability, suggesting right atrial pressure of 15 mmHg. IAS/Shunts: No atrial level shunt detected by color flow Doppler.  LEFT VENTRICLE PLAX 2D LVIDd:         4.40 cm       Diastology LVIDs:         3.60 cm       LV e' lateral:   7.65 cm/s LV PW:         0.70 cm       LV E/e' lateral: 15.8 LV IVS:        0.70 cm       LV e' medial:    3.30 cm/s LVOT diam:     1.70 cm       LV E/e' medial:  36.7 LV SV:         33 ml LV SV Index:   20.92 LVOT Area:     2.27 cm  LV Volumes (MOD) LV area d, A2C:    28.90 cm LV area d, A4C:  26.30 cm LV area s, A2C:    21.00 cm LV area s, A4C:    21.00 cm LV major d, A2C:   7.08 cm LV major d, A4C:   6.97 cm LV major s, A2C:   6.07 cm LV major s, A4C:   6.70 cm LV vol d, MOD A2C: 99.3 ml LV vol d, MOD A4C: 83.4 ml LV vol s, MOD A2C: 64.5 ml LV vol s, MOD A4C: 57.1 ml LV SV MOD A2C:     34.8 ml LV SV MOD A4C:     83.4 ml LV SV MOD BP:      27.9 ml RIGHT VENTRICLE RV S prime:     13.50 cm/s TAPSE (M-mode): 1.4 cm LEFT ATRIUM             Index       RIGHT ATRIUM           Index LA diam:        4.00 cm 2.46  cm/m  RA Area:     19.90 cm LA Vol (A2C):   66.6 ml 41.04 ml/m RA Volume:   57.40 ml  35.37 ml/m LA Vol (A4C):   50.9 ml 31.36 ml/m LA Biplane Vol: 64.5 ml 39.74 ml/m  AORTIC VALVE LVOT Vmax:   109.00 cm/s LVOT Vmean:  68.900 cm/s LVOT VTI:    0.241 m  AORTA Ao Root diam: 2.70 cm MITRAL VALVE MV Area (PHT): 3.53 cm              SHUNTS MV PHT:        62.35 msec            Systemic VTI:  0.24 m MV Decel Time: 215 msec              Systemic Diam: 1.70 cm MV E velocity: 121.00 cm/s 103 cm/s MV A velocity: 79.30 cm/s  70.3 cm/s MV E/A ratio:  1.53        1.5  Mihai Croitoru MD Electronically signed by Sanda Klein MD Signature Date/Time: 07/31/2019/3:01:06 PM    Final     Cardiac Studies   TTE: 07/31/19  IMPRESSIONS    1. Left ventricular ejection fraction, by visual estimation, is 35 to 40%. The left ventricle has moderately decreased function. There is no left ventricular hypertrophy.  2. Mild dyskinesis of the left ventricular, basal-mid inferoseptal wall and inferior wall (consistent with previous infarction in the right coronary artery distribution).  3. Severe hypokinesis of the left ventricular, basal anteroseptal wall. The apical segments contract normally (consistent with occlusion of the proximal LAD artery with patent graft to the distal vessel).  4. Abnormal septal motion consistent with post-operative status.  5. Elevated left atrial pressure.  6. Left ventricular diastolic parameters are consistent with Grade II diastolic dysfunction (pseudonormalization).  7. The left ventricle demonstrates regional wall motion abnormalities.  8. Global right ventricle has normal systolic function.The right ventricular size is normal. No increase in right ventricular wall thickness.  9. Left atrial size was mildly dilated. 10. Right atrial size was normal. 11. Mild mitral annular calcification. 12. The mitral valve is normal in structure. Mild to moderate mitral valve regurgitation. 13. The  tricuspid valve is normal in structure. 14. The tricuspid valve is normal in structure. Tricuspid valve regurgitation is not demonstrated. 15. The aortic valve is normal in structure. Aortic valve regurgitation is not visualized. Mild aortic valve sclerosis without stenosis. 16. The pulmonic valve was grossly normal. Pulmonic  valve regurgitation is trivial. 17. TR signal is inadequate for assessing pulmonary artery systolic pressure. 18. The inferior vena cava is dilated in size with <50% respiratory variability, suggesting right atrial pressure of 15 mmHg. 19. No significant change from prior study. 20. Prior images reviewed side by side.  Cath: 08/01/19   Ost LAD to Prox LAD lesion is 100% stenosed.  Ost 1st Mrg to 1st Mrg lesion is 40% stenosed.  Ost 2nd Mrg lesion is 30% stenosed.  Ost 3rd Mrg lesion is 90% stenosed.  Ost RCA to Dist RCA lesion is 100% stenosed.  LIMA and is normal in caliber.  The graft exhibits no disease.  SVG.  Origin to Prox Graft lesion is 100% stenosed.  SVG due to known occlusion.  Origin to Prox Graft lesion is 100% stenosed.  Prox Cx to Mid Cx lesion is 10% stenosed.   1. Triple vessel CAD s/p CABG with 1 patent bypass graft 2. Occluded proximal LAD. Patent LIMA to mid LAD 3. Known occlusion of the SVG to OM. The mid Circumflex stent is patent. Severe disease in a small caliber OM branch that fills from left to left collaterals. Unchanged from last cath.  4. Chronic occlusion of the proximal RCA. Known occlusion of the vein graft to the distal RCA. The distal RCA fills from left to right collaterals.   Recommendations: Coronary disease unchanged from last cath in 2019. Continue medical management of CAD.   Diagnostic Dominance: Right    Patient Profile     86 y.o. female with a hx of CAD s/p CABG with patent LIMA-LAD and collaterals, chronic systolic heart failure, ischemic cardiomyopathy, paroxysmal atrial fibrillation on eliquis,  HTN, CKD stage IV, DM2, COPD, PAD,Luetscher syndrome, and HLDwho was seen for the evaluation of NSTEMIat the request of Dr. Denton Brick.  Assessment & Plan    1. NSTEMI: hsTn peaked at [address] in the setting of Afib RVR. Initial EKG showed ST depression in inferolateral leads, but resolved on subsequent tracings. Last dose of Eliquis 1/25, evening dose. Underwent cardiac cath noted above with no changes from prior cath in 2019 (occluded SVG-RCA, SVG-OM, patent LIMA-LAD and patent previously placed mLCx stent, noted collaterals to dRCA). Plan to continue with medical management of disease.   2. PAF: remains on SR on telemetry. Eliquis was held for cath, will restart this morning at home dose of 2.5mg  BID. Tolerating coreg 6.25mg  BID.  -- continue amiodarone 200mg  daily  3. CKD stage 3: 1.2>>1.37. BMET pending this morning.  4. Chronic systolic HF: volume stable on exam. EF noted slightly lower this admission at 35-40%. Restarted coreg as above, Entresto has been on hold given renal dysfunction. Will restart pending morning BMET.   5. Hx of hypothyroidism: TSH levels are now low. Levothyroxine reduced to 20mcg daily. Patient reports her levothyroxine has been reduced several times over the past couple of months. Management per primary. Suspect this is playing a role in her weight loss and PAF.   For questions or updates, please contact [place] Please consult [email or web address] for contact info under    Signed, Reino Bellis, NP  08/02/2019, 8:13 AM

## 2019-08-03 LAB — GI PATHOGEN PANEL BY PCR, STOOL

## 2019-08-07 DIAGNOSIS — E1140 Type 2 diabetes mellitus with diabetic neuropathy, unspecified: Secondary | ICD-10-CM | POA: Diagnosis not present

## 2019-08-07 DIAGNOSIS — I214 Non-ST elevation (NSTEMI) myocardial infarction: Secondary | ICD-10-CM | POA: Diagnosis not present

## 2019-08-07 DIAGNOSIS — I255 Ischemic cardiomyopathy: Secondary | ICD-10-CM | POA: Diagnosis not present

## 2019-08-07 DIAGNOSIS — E1122 Type 2 diabetes mellitus with diabetic chronic kidney disease: Secondary | ICD-10-CM | POA: Diagnosis not present

## 2019-08-07 DIAGNOSIS — I480 Paroxysmal atrial fibrillation: Secondary | ICD-10-CM | POA: Diagnosis not present

## 2019-08-07 DIAGNOSIS — I130 Hypertensive heart and chronic kidney disease with heart failure and stage 1 through stage 4 chronic kidney disease, or unspecified chronic kidney disease: Secondary | ICD-10-CM | POA: Diagnosis not present

## 2019-08-07 DIAGNOSIS — I5022 Chronic systolic (congestive) heart failure: Secondary | ICD-10-CM | POA: Diagnosis not present

## 2019-08-07 DIAGNOSIS — I2510 Atherosclerotic heart disease of native coronary artery without angina pectoris: Secondary | ICD-10-CM | POA: Diagnosis not present

## 2019-08-07 DIAGNOSIS — N184 Chronic kidney disease, stage 4 (severe): Secondary | ICD-10-CM | POA: Diagnosis not present

## 2019-08-08 DIAGNOSIS — Z681 Body mass index (BMI) 19 or less, adult: Secondary | ICD-10-CM | POA: Diagnosis not present

## 2019-08-08 DIAGNOSIS — E039 Hypothyroidism, unspecified: Secondary | ICD-10-CM | POA: Diagnosis not present

## 2019-08-08 DIAGNOSIS — I2510 Atherosclerotic heart disease of native coronary artery without angina pectoris: Secondary | ICD-10-CM | POA: Diagnosis not present

## 2019-08-13 ENCOUNTER — Ambulatory Visit: Payer: Medicare HMO | Admitting: Physician Assistant

## 2019-08-15 DIAGNOSIS — E039 Hypothyroidism, unspecified: Secondary | ICD-10-CM | POA: Diagnosis not present

## 2019-08-15 DIAGNOSIS — Z681 Body mass index (BMI) 19 or less, adult: Secondary | ICD-10-CM | POA: Diagnosis not present

## 2019-08-15 DIAGNOSIS — I2510 Atherosclerotic heart disease of native coronary artery without angina pectoris: Secondary | ICD-10-CM | POA: Diagnosis not present

## 2019-08-15 NOTE — Progress Notes (Deleted)
Cardiology Office Note    Date:  08/15/2019   ID:  Debra [name], DOB 1929/04/07, MRN [id number]  PCP:  Sharilyn Sites, MD  Cardiologist: Rozann Lesches, MD EPS: None  No chief complaint on file.   History of Present Illness:  Debra [name] is a 86 y.o. female with history of CAD status post CABG with patent LIMA-LAD and collaterals, chronic systolic heart failure, ischemic cardiomyopathy, paroxysmal atrial fibrillation on eliquis, HTN, CKD stage IV, DM2, COPD, PAD,Luetscher syndrome.  Patient presented to [facility] with severe shortness of breath and was found to be in atrial for better 150 bpm with low blood pressure.  Cardioversion attempted twice was unsuccessful but was able to slow the rate.  EKG showed ST depression and she had significant troponin elevation and NSTEMI.  Cardiac cath showed triple-vessel CAD with 1 patent bypass graft basically unchanged from last cath in 2019, medical treatment recommended.  2D echo LVEF 35 to 40% which was slightly lower than prior echo.  Debra [name] was held because of rising creatinine with plans to start as an outpatient.  Coreg restarted and continued on amiodarone.  TSH levels were low and levothyroxine was reduced to 50 mcg daily.  This has been adjusted several times in the last few months and felt to be contributing to her A. fib and weight loss.  Past Medical History:  Diagnosis Date  . Arthritis   . CHF (congestive heart failure) ([place])   . CKD (chronic kidney disease) stage 4, GFR 15-29 ml/min (HCC)   . Colon cancer ([place])   . COPD (chronic obstructive pulmonary disease) ([place])   . Coronary artery disease    a. s/p CABG in 1997 (no detials provided) b. 12/2017: NSTEMI with cath showing patent LIMA-LAD with occluded SVG-OM and presumed occluded SVG-RCA but unable to be visualized, RCA was filled by collaterals. Medical management recommended.   . DDD (degenerative disc disease), lumbar   . Diabetic neuropathy ([place])   . GERD  (gastroesophageal reflux disease)   . Gout   . Hypertension   . Restless leg syndrome   . Thyroid disease   . Type 2 diabetes mellitus ([place])     Past Surgical History:  Procedure Laterality Date  . ABDOMINAL HYSTERECTOMY    . CHOLECYSTECTOMY    . COLON RESECTION    . CORONARY ARTERY BYPASS GRAFT    . ILIAC ARTERY STENT    . LEFT HEART CATH AND CORS/GRAFTS ANGIOGRAPHY N/A 12/28/2017   Procedure: LEFT HEART CATH AND CORS/GRAFTS ANGIOGRAPHY;  Surgeon: Martinique, Peter M, MD;  Location: [place] [facility];  Service: Cardiovascular;  Laterality: N/A;  . LEFT HEART CATH AND CORS/GRAFTS ANGIOGRAPHY N/A 08/01/2019   Procedure: LEFT HEART CATH AND CORS/GRAFTS ANGIOGRAPHY;  Surgeon: Burnell Blanks, MD;  Location: [place] [facility];  Service: Cardiovascular;  Laterality: N/A;  . LUNG SURGERY Left 2012   left lung resection  . RENAL ARTERY STENT    . THYROID SURGERY    . ULTRASOUND GUIDANCE FOR VASCULAR ACCESS  12/28/2017   Procedure: Ultrasound Guidance For Vascular Access;  Surgeon: Martinique, Peter M, MD;  Location: [place] [facility];  Service: Cardiovascular;;    Current Medications: No outpatient medications have been marked as taking for the 08/20/19 encounter (Appointment) with Imogene Burn, PA-C.     Allergies:   Ciprofloxacin, Iodine, and Iodinated diagnostic agents   Social History   Socioeconomic History  . Marital status: Widowed    Spouse name: Not  on file  . Number of children: Not on file  . Years of education: Not on file  . Highest education level: Not on file  Occupational History  . Not on file  Tobacco Use  . Smoking status: Former Smoker    Quit date: 03/21/1996    Years since quitting: 23.4  . Smokeless tobacco: Never Used  Substance and Sexual Activity  . Alcohol use: No  . Drug use: No  . Sexual activity: Not on file  Other Topics Concern  . Not on file  Social History Narrative  . Not on file   Social Determinants of Health   Financial  Resource Strain:   . Difficulty of Paying Living Expenses: Not on file  Food Insecurity:   . Worried About Charity fundraiser in the Last Year: Not on file  . Ran Out of Food in the Last Year: Not on file  Transportation Needs:   . Lack of Transportation (Medical): Not on file  . Lack of Transportation (Non-Medical): Not on file  Physical Activity:   . Days of Exercise per Week: Not on file  . Minutes of Exercise per Session: Not on file  Stress:   . Feeling of Stress : Not on file  Social Connections:   . Frequency of Communication with Friends and Family: Not on file  . Frequency of Social Gatherings with Friends and Family: Not on file  . Attends Religious Services: Not on file  . Active Member of Clubs or Organizations: Not on file  . Attends Archivist Meetings: Not on file  . Marital Status: Not on file     Family History:  The patient's ***family history includes Breast cancer in her daughter; Colon cancer in her mother; Heart attack in her sister; Heart disease in her father.   ROS:   Please see the history of present illness.    ROS All other systems reviewed and are negative.   PHYSICAL EXAM:   VS:  There were no vitals taken for this visit.  Physical Exam  GEN: Well nourished, well developed, in no acute distress  HEENT: normal  Neck: no JVD, carotid bruits, or masses Cardiac:RRR; no murmurs, rubs, or gallops  Respiratory:  clear to auscultation bilaterally, normal work of breathing GI: soft, nontender, nondistended, + BS Ext: without cyanosis, clubbing, or edema, Good distal pulses bilaterally MS: no deformity or atrophy  Skin: warm and dry, no rash Neuro:  Alert and Oriented x 3, Strength and sensation are intact Psych: euthymic mood, full affect  Wt Readings from Last 3 Encounters:  07/31/19 119 lb 3.2 oz (54.1 kg)  03/21/19 119 lb (54 kg)  02/24/19 134 lb 7.7 oz (61 kg)      Studies/Labs Reviewed:   EKG:  EKG is*** ordered today.  The ekg  ordered today demonstrates ***  Recent Labs: 07/30/2019: ALT 13; TSH 0.019 08/01/2019: Hemoglobin 11.0; Magnesium 2.0; Platelets 207 08/02/2019: BUN 36; Creatinine, Ser 1.50; Potassium 4.2; Sodium 137   Lipid Panel    Component Value Date/Time   CHOL 89 12/28/2017 0334   TRIG 73 12/28/2017 0334   HDL 36 (L) 12/28/2017 0334   CHOLHDL 2.5 12/28/2017 0334   VLDL 15 12/28/2017 0334   LDLCALC 38 12/28/2017 0334    Additional studies/ records that were reviewed today include:    TTE: 07/31/19   IMPRESSIONS      1. Left ventricular ejection fraction, by visual estimation, is 35 to 40%. The left ventricle  has moderately decreased function. There is no left ventricular hypertrophy.  2. Mild dyskinesis of the left ventricular, basal-mid inferoseptal wall and inferior wall (consistent with previous infarction in the right coronary artery distribution).  3. Severe hypokinesis of the left ventricular, basal anteroseptal wall. The apical segments contract normally (consistent with occlusion of the proximal LAD artery with patent graft to the distal vessel).  4. Abnormal septal motion consistent with post-operative status.  5. Elevated left atrial pressure.  6. Left ventricular diastolic parameters are consistent with Grade II diastolic dysfunction (pseudonormalization).  7. The left ventricle demonstrates regional wall motion abnormalities.  8. Global right ventricle has normal systolic function.The right ventricular size is normal. No increase in right ventricular wall thickness.  9. Left atrial size was mildly dilated. 10. Right atrial size was normal. 11. Mild mitral annular calcification. 12. The mitral valve is normal in structure. Mild to moderate mitral valve regurgitation. 13. The tricuspid valve is normal in structure. 14. The tricuspid valve is normal in structure. Tricuspid valve regurgitation is not demonstrated. 15. The aortic valve is normal in structure. Aortic valve  regurgitation is not visualized. Mild aortic valve sclerosis without stenosis. 16. The pulmonic valve was grossly normal. Pulmonic valve regurgitation is trivial. 17. TR signal is inadequate for assessing pulmonary artery systolic pressure. 18. The inferior vena cava is dilated in size with <50% respiratory variability, suggesting right atrial pressure of 15 mmHg. 19. No significant change from prior study. 20. Prior images reviewed side by side.   Cath: 08/01/19    Ost LAD to Prox LAD lesion is 100% stenosed.  Ost 1st Mrg to 1st Mrg lesion is 40% stenosed.  Ost 2nd Mrg lesion is 30% stenosed.  Ost 3rd Mrg lesion is 90% stenosed.  Ost RCA to Dist RCA lesion is 100% stenosed.  LIMA and is normal in caliber.  The graft exhibits no disease.  SVG.  Origin to Prox Graft lesion is 100% stenosed.  SVG due to known occlusion.  Origin to Prox Graft lesion is 100% stenosed.  Prox Cx to Mid Cx lesion is 10% stenosed.   1. Triple vessel CAD s/p CABG with 1 patent bypass graft 2. Occluded proximal LAD. Patent LIMA to mid LAD 3. Known occlusion of the SVG to OM. The mid Circumflex stent is patent. Severe disease in a small caliber OM branch that fills from left to left collaterals. Unchanged from last cath.  4. Chronic occlusion of the proximal RCA. Known occlusion of the vein graft to the distal RCA. The distal RCA fills from left to right collaterals.    Recommendations: Coronary disease unchanged from last cath in 2019. Continue medical management of CAD.      ASSESSMENT:    No diagnosis found.   PLAN:  In order of problems listed above:  CAD status post NSTEMI 07/2019 in the setting of rapid atrial fib.  Cardiac cath as described above unchanged from 2019 recommended medical therapy.  Ischemic cardiomyopathy LVEF 35 to 40% slightly lower than prior echo.  Entresto held because of rising creatinine after cath with plans to restart as outpatient.  Chronic systolic  CHF  Paroxysmal atrial fibrillation on Eliquis, amiodarone and carvedilol.  Rapid atrial fib in the hospital felt secondary to low TSH levels.  Levothyroxine was reduced.  Managed by PCP.  CKD stage IV Entresto held in the hospital creatinine 1.50 at discharge  Hyperlipidemia    Medication Adjustments/Labs and Tests Ordered: Current medicines are reviewed at length with the patient  today.  Concerns regarding medicines are outlined above.  Medication changes, Labs and Tests ordered today are listed in the Patient Instructions below. There are no Patient Instructions on file for this visit.   Sumner Boast, PA-C  08/15/2019 9:43 AM    [facility] [address], [place], [place]  [address] Phone: [phone]; Fax: [phone]

## 2019-08-20 ENCOUNTER — Ambulatory Visit: Payer: Medicare HMO | Admitting: Physician Assistant

## 2019-08-22 DIAGNOSIS — E1122 Type 2 diabetes mellitus with diabetic chronic kidney disease: Secondary | ICD-10-CM | POA: Diagnosis not present

## 2019-08-22 DIAGNOSIS — I480 Paroxysmal atrial fibrillation: Secondary | ICD-10-CM | POA: Diagnosis not present

## 2019-08-22 DIAGNOSIS — E1140 Type 2 diabetes mellitus with diabetic neuropathy, unspecified: Secondary | ICD-10-CM | POA: Diagnosis not present

## 2019-08-22 DIAGNOSIS — I255 Ischemic cardiomyopathy: Secondary | ICD-10-CM | POA: Diagnosis not present

## 2019-08-22 DIAGNOSIS — I5022 Chronic systolic (congestive) heart failure: Secondary | ICD-10-CM | POA: Diagnosis not present

## 2019-08-22 DIAGNOSIS — I130 Hypertensive heart and chronic kidney disease with heart failure and stage 1 through stage 4 chronic kidney disease, or unspecified chronic kidney disease: Secondary | ICD-10-CM | POA: Diagnosis not present

## 2019-08-22 DIAGNOSIS — I214 Non-ST elevation (NSTEMI) myocardial infarction: Secondary | ICD-10-CM | POA: Diagnosis not present

## 2019-08-22 DIAGNOSIS — N184 Chronic kidney disease, stage 4 (severe): Secondary | ICD-10-CM | POA: Diagnosis not present

## 2019-08-22 DIAGNOSIS — I2510 Atherosclerotic heart disease of native coronary artery without angina pectoris: Secondary | ICD-10-CM | POA: Diagnosis not present

## 2019-08-24 ENCOUNTER — Encounter: Payer: Self-pay | Admitting: Cardiology

## 2019-08-24 ENCOUNTER — Telehealth: Payer: Self-pay

## 2019-08-24 ENCOUNTER — Telehealth (INDEPENDENT_AMBULATORY_CARE_PROVIDER_SITE_OTHER): Payer: Medicare HMO | Admitting: Cardiology

## 2019-08-24 VITALS — BP 170/71 | HR 52 | Ht 67.0 in | Wt 122.6 lb

## 2019-08-24 DIAGNOSIS — I480 Paroxysmal atrial fibrillation: Secondary | ICD-10-CM | POA: Diagnosis not present

## 2019-08-24 DIAGNOSIS — I25119 Atherosclerotic heart disease of native coronary artery with unspecified angina pectoris: Secondary | ICD-10-CM

## 2019-08-24 DIAGNOSIS — I255 Ischemic cardiomyopathy: Secondary | ICD-10-CM

## 2019-08-24 DIAGNOSIS — N1832 Chronic kidney disease, stage 3b: Secondary | ICD-10-CM

## 2019-08-24 DIAGNOSIS — I10 Essential (primary) hypertension: Secondary | ICD-10-CM

## 2019-08-24 NOTE — Telephone Encounter (Signed)

## 2019-08-24 NOTE — Progress Notes (Signed)
Virtual Visit via Telephone Note   This visit type was conducted due to national recommendations for restrictions regarding the COVID-19 Pandemic (e.g. social distancing) in an effort to limit this patient's exposure and mitigate transmission in our community.  Due to her co-morbid illnesses, this patient is at least at moderate risk for complications without adequate follow up.  This format is felt to be most appropriate for this patient at this time.  The patient did not have access to video technology/had technical difficulties with video requiring transitioning to audio format only (telephone).  All issues noted in this document were discussed and addressed.  No physical exam could be performed with this format.  Please refer to the patient's chart for her  consent to telehealth for [facility].   Date:  08/24/2019   ID:  Debra [name], DOB Jan 18, 1929, MRN [id number]  Patient Location: Home Provider Location: Office  PCP:  Sharilyn Sites, MD  Cardiologist:  Rozann Lesches, MD Electrophysiologist:  None   Evaluation Performed:  Follow-Up Visit  Chief Complaint:  Hospital follow-up  History of Present Illness:    Debra [name] is a 86 y.o. female last assessed via telehealth encounter in July 2020.  We spoke by phone today, also talked with her daughter [name].  I reviewed interval records and updated the chart. She was hospitalized in January of this year with symptomatic, rapid atrial fibrillation and associated NSTEMI in the setting of known multivessel CAD status post CABG with graft disease.  She was followed by our cardiology service at [facility] and underwent cardiac catheterization that demonstrated overall stable native coronary and bypass graft anatomy in comparison to 2019 with decision to treat medically.  Follow-up echocardiogram revealed LVEF to 40% range with wall motion abnormalities consistent with ischemic heart disease, also mild to moderate mitral regurgitation.  I  reviewed cardiac regimen at discharge from the hospital, Delene Loll was held pending follow-up BMET.  Last creatinine was 1.5 with potassium 4.2 on January 28.  She was in sinus rhythm at hospital discharge.  She reportedly had a follow-up BMET with Larene Pickett last week, we will request the results.  She has had PT at home a few days a week, also trying to walk regularly around her house.  Strength is improving, she has had no obvious palpitations or chest pain.  Today's blood pressure was elevated, on average her systolic has been in the 768T at home.  I reviewed her current medications as outlined below.  Past Medical History:  Diagnosis Date  . Arthritis   . CKD (chronic kidney disease) stage 4, GFR 15-29 ml/min (HCC)   . Colon cancer ([place])   . COPD (chronic obstructive pulmonary disease) ([place])   . Coronary artery disease    a. s/p CABG in 1997 (no detials provided) b. 12/2017: NSTEMI with cath showing patent LIMA-LAD with occluded SVG-OM and presumed occluded SVG-RCA but unable to be visualized, RCA was filled by collaterals. Medical management recommended. c.  07/2019: NSTEMI with rapid atrial fibrillation, stable coronary and bypass graft anatomy by cardiac catheterization  . DDD (degenerative disc disease), lumbar   . Diabetic neuropathy ([place])   . Essential hypertension   . GERD (gastroesophageal reflux disease)   . Gout   . Hypothyroidism   . Ischemic cardiomyopathy   . Restless leg syndrome   . Type 2 diabetes mellitus ([place])    Past Surgical History:  Procedure Laterality Date  . ABDOMINAL HYSTERECTOMY    . CHOLECYSTECTOMY    .  COLON RESECTION    . CORONARY ARTERY BYPASS GRAFT    . ILIAC ARTERY STENT    . LEFT HEART CATH AND CORS/GRAFTS ANGIOGRAPHY N/A 12/28/2017   Procedure: LEFT HEART CATH AND CORS/GRAFTS ANGIOGRAPHY;  Surgeon: Martinique, Peter M, MD;  Location: [place] CV LAB;  Service: Cardiovascular;  Laterality: N/A;  . LEFT HEART CATH AND CORS/GRAFTS ANGIOGRAPHY N/A  08/01/2019   Procedure: LEFT HEART CATH AND CORS/GRAFTS ANGIOGRAPHY;  Surgeon: Burnell Blanks, MD;  Location: [place] [facility] LAB;  Service: Cardiovascular;  Laterality: N/A;  . LUNG SURGERY Left 2012   left lung resection  . RENAL ARTERY STENT    . THYROID SURGERY    . ULTRASOUND GUIDANCE FOR VASCULAR ACCESS  12/28/2017   Procedure: Ultrasound Guidance For Vascular Access;  Surgeon: Martinique, Peter M, MD;  Location: [place] CV LAB;  Service: Cardiovascular;;     Current Meds  Medication Sig  . acetaminophen (TYLENOL) 325 MG tablet Take 650 mg by mouth every 6 (six) hours as needed for mild pain.  Marland Kitchen albuterol (VENTOLIN HFA) 108 (90 Base) MCG/ACT inhaler Inhale 1 puff into the lungs every 6 (six) hours as needed for shortness of breath or wheezing.  Marland Kitchen amiodarone (PACERONE) 200 MG tablet Take 1 tablet (200 mg total) by mouth daily.  Marland Kitchen apixaban (ELIQUIS) 2.5 MG TABS tablet Take 1 tablet (2.5 mg total) by mouth 2 (two) times daily.  Marland Kitchen atorvastatin (LIPITOR) 80 MG tablet Take 1 tablet (80 mg total) by mouth daily at 6 PM.  . carvedilol (COREG) 6.25 MG tablet Take 1 tablet (6.25 mg total) by mouth 2 (two) times daily with a meal.  . Cholecalciferol (VITAMIN D) 125 MCG (5000 UT) CAPS Take 1 capsule by mouth daily.  . diazepam (VALIUM) 2 MG tablet Take 2 mg by mouth daily as needed.  . gabapentin (NEURONTIN) 100 MG capsule Take 100 mg by mouth 3 (three) times daily.   Marland Kitchen HYDROcodone-acetaminophen (NORCO) 10-325 MG tablet Take 1 tablet by mouth every 6 (six) hours as needed for moderate pain or severe pain. 1 tab po hs/ and prn.  . insulin glargine (LANTUS) 100 unit/mL SOPN Inject 0.1 mLs (10 Units total) into the skin at bedtime.  Marland Kitchen levothyroxine (SYNTHROID) 50 MCG tablet Take 1 tablet (50 mcg total) by mouth daily at 6 (six) AM.  . Lidocaine HCl-Benzyl Alcohol (SALONPAS LIDOCAINE PLUS) 4-10 % LIQD Apply 1 application topically as needed.  . Menthol, Topical Analgesic, (ASPERCREME MAX  ROLL-ON) 16 % LIQD Apply 1 application topically as needed.  . pantoprazole (PROTONIX) 40 MG tablet Take 40 mg by mouth daily.   . polyethylene glycol (MIRALAX / GLYCOLAX) 17 g packet Take 17 g by mouth daily as needed for mild constipation.  Marland Kitchen rOPINIRole (REQUIP) 2 MG tablet Take 2 mg by mouth at bedtime.      Allergies:   Ciprofloxacin, Iodine, and Iodinated diagnostic agents   ROS:   No dizziness or syncope.   Prior CV studies:   The following studies were reviewed today:  Cardiac catheterization 08/01/2019:  Ost LAD to Prox LAD lesion is 100% stenosed.  Ost 1st Mrg to 1st Mrg lesion is 40% stenosed.  Ost 2nd Mrg lesion is 30% stenosed.  Ost 3rd Mrg lesion is 90% stenosed.  Ost RCA to Dist RCA lesion is 100% stenosed.  LIMA and is normal in caliber.  The graft exhibits no disease.  SVG.  Origin to Prox Graft lesion is 100% stenosed.  SVG due  to known occlusion.  Origin to Prox Graft lesion is 100% stenosed.  Prox Cx to Mid Cx lesion is 10% stenosed.   1. Triple vessel CAD s/p CABG with 1 patent bypass graft 2. Occluded proximal LAD. Patent LIMA to mid LAD 3. Known occlusion of the SVG to OM. The mid Circumflex stent is patent. Severe disease in a small caliber OM branch that fills from left to left collaterals. Unchanged from last cath.  4. Chronic occlusion of the proximal RCA. Known occlusion of the vein graft to the distal RCA. The distal RCA fills from left to right collaterals.   Recommendations: Coronary disease unchanged from last cath in 2019. Continue medical management of CAD.   Echocardiogram 07/31/2019: 1. Left ventricular ejection fraction, by visual estimation, is 35 to  40%. The left ventricle has moderately decreased function. There is no  left ventricular hypertrophy.  2. Mild dyskinesis of the left ventricular, basal-mid inferoseptal wall  and inferior wall (consistent with previous infarction in the right  coronary artery distribution).   3. Severe hypokinesis of the left ventricular, basal anteroseptal wall.  The apical segments contract normally (consistent with occlusion of the  proximal LAD artery with patent graft to the distal vessel).  4. Abnormal septal motion consistent with post-operative status.  5. Elevated left atrial pressure.  6. Left ventricular diastolic parameters are consistent with Grade II  diastolic dysfunction (pseudonormalization).  7. The left ventricle demonstrates regional wall motion abnormalities.  8. Global right ventricle has normal systolic function.The right  ventricular size is normal. No increase in right ventricular wall  thickness.  9. Left atrial size was mildly dilated.  10. Right atrial size was normal.  11. Mild mitral annular calcification.  12. The mitral valve is normal in structure. Mild to moderate mitral valve  regurgitation.  13. The tricuspid valve is normal in structure.  14. The tricuspid valve is normal in structure. Tricuspid valve  regurgitation is not demonstrated.  15. The aortic valve is normal in structure. Aortic valve regurgitation is  not visualized. Mild aortic valve sclerosis without stenosis.  16. The pulmonic valve was grossly normal. Pulmonic valve regurgitation is  trivial.  17. TR signal is inadequate for assessing pulmonary artery systolic  pressure.  18. The inferior vena cava is dilated in size with <50% respiratory  variability, suggesting right atrial pressure of 15 mmHg.  19. No significant change from prior study.  20. Prior images reviewed side by side.   Labs/Other Tests and Data Reviewed:    EKG:  An ECG dated 07/30/2019 was personally reviewed today and demonstrated:  Rapid atrial fibrillation with substantial ST segment abnormalities suggestive of ischemia.  Recent Labs: 07/30/2019: ALT 13; TSH 0.019 08/01/2019: Hemoglobin 11.0; Magnesium 2.0; Platelets 207 08/02/2019: BUN 36; Creatinine, Ser 1.50; Potassium 4.2; Sodium 137  May  2020: Cholesterol 102, triglycerides 89, HDL 43, LDL 41   Wt Readings from Last 3 Encounters:  08/24/19 122 lb 9.6 oz (55.6 kg)  07/31/19 119 lb 3.2 oz (54.1 kg)  03/21/19 119 lb (54 kg)     Objective:    Vital Signs:  BP (!) 170/71   Pulse (!) 52   Ht 5\' 7"  (1.702 m)   Wt 122 lb 9.6 oz (55.6 kg)   BMI 19.20 kg/m    Patient spoke in full sentences, not short of breath. No audible wheezing or coughing. Speech pattern normal.  ASSESSMENT & PLAN:    1.  Paroxysmal atrial fibrillation status post episode of  RVR complicated by NSTEMI in January as discussed above.  She does not report any active palpitations and was in sinus rhythm at hospital discharge.  Plan to continue Coreg, amiodarone, and Eliquis.  2.  CAD status post prior CABG with recent cardiac catheterization showing stable coronary and bypass graft anatomy (occluded SVG to RCA and SVG to OM, patent LIMA to LAD, patent mid circumflex stent, collaterals to the distal RCA).  No active angina at this time.  She is not on aspirin given use of Eliquis.  Otherwise continue beta-blocker and statin.  3.  Ischemic cardiomyopathy with LVEF approximately 35 to 40% by recent echocardiogram.  Continue Coreg, requesting recent BMET results from [facility] and if renal function improved, may consider starting back on Entresto.  4.  CKD stage III, creatinine 1.5 at hospital discharge.   Time:   Today, I have spent 7 minutes with the patient with telehealth technology discussing the above problems.     Medication Adjustments/Labs and Tests Ordered: Current medicines are reviewed at length with the patient today.  Concerns regarding medicines are outlined above.   Tests Ordered: No orders of the defined types were placed in this encounter.   Medication Changes: No orders of the defined types were placed in this encounter.   Follow Up:  In Person 3 months in the [place] office.  Signed, Rozann Lesches, MD  08/24/2019 2:46 PM     Start

## 2019-08-24 NOTE — Patient Instructions (Signed)
Medication Instructions:  Your physician recommends that you continue on your current medications as directed. Please refer to the Current Medication list given to you today.  *If you need a refill on your cardiac medications before your next appointment, please call your pharmacy*  Lab Work: I will request lab work from [facility]  If you have labs (blood work) drawn today and your tests are completely normal, you will receive your results only by: Marland Kitchen MyChart Message (if you have MyChart) OR . A paper copy in the mail If you have any lab test that is abnormal or we need to change your treatment, we will call you to review the results.  Testing/Procedures: None today  Follow-Up: At [facility], you and your health needs are our priority.  As part of our continuing mission to provide you with exceptional heart care, we have created designated Provider Care Teams.  These Care Teams include your primary Cardiologist (physician) and Advanced Practice Providers (APPs -  Physician Assistants and Nurse Practitioners) who all work together to provide you with the care you need, when you need it.  Your next appointment:   3 month(s)  The format for your next appointment:   In Person  Provider:   Rozann Lesches, MD  Other Instructions None      Thank you for choosing [facility] !

## 2019-08-27 DIAGNOSIS — I480 Paroxysmal atrial fibrillation: Secondary | ICD-10-CM | POA: Diagnosis not present

## 2019-08-27 DIAGNOSIS — I214 Non-ST elevation (NSTEMI) myocardial infarction: Secondary | ICD-10-CM | POA: Diagnosis not present

## 2019-08-27 DIAGNOSIS — I5022 Chronic systolic (congestive) heart failure: Secondary | ICD-10-CM | POA: Diagnosis not present

## 2019-08-27 DIAGNOSIS — I130 Hypertensive heart and chronic kidney disease with heart failure and stage 1 through stage 4 chronic kidney disease, or unspecified chronic kidney disease: Secondary | ICD-10-CM | POA: Diagnosis not present

## 2019-08-29 DIAGNOSIS — I480 Paroxysmal atrial fibrillation: Secondary | ICD-10-CM | POA: Diagnosis not present

## 2019-08-29 DIAGNOSIS — I255 Ischemic cardiomyopathy: Secondary | ICD-10-CM | POA: Diagnosis not present

## 2019-08-29 DIAGNOSIS — I130 Hypertensive heart and chronic kidney disease with heart failure and stage 1 through stage 4 chronic kidney disease, or unspecified chronic kidney disease: Secondary | ICD-10-CM | POA: Diagnosis not present

## 2019-08-29 DIAGNOSIS — N184 Chronic kidney disease, stage 4 (severe): Secondary | ICD-10-CM | POA: Diagnosis not present

## 2019-08-29 DIAGNOSIS — I214 Non-ST elevation (NSTEMI) myocardial infarction: Secondary | ICD-10-CM | POA: Diagnosis not present

## 2019-08-29 DIAGNOSIS — E1122 Type 2 diabetes mellitus with diabetic chronic kidney disease: Secondary | ICD-10-CM | POA: Diagnosis not present

## 2019-08-29 DIAGNOSIS — I2510 Atherosclerotic heart disease of native coronary artery without angina pectoris: Secondary | ICD-10-CM | POA: Diagnosis not present

## 2019-08-29 DIAGNOSIS — E1140 Type 2 diabetes mellitus with diabetic neuropathy, unspecified: Secondary | ICD-10-CM | POA: Diagnosis not present

## 2019-08-29 DIAGNOSIS — I5022 Chronic systolic (congestive) heart failure: Secondary | ICD-10-CM | POA: Diagnosis not present

## 2019-08-31 ENCOUNTER — Ambulatory Visit: Payer: Medicare HMO | Admitting: Podiatry

## 2019-08-31 DIAGNOSIS — I480 Paroxysmal atrial fibrillation: Secondary | ICD-10-CM | POA: Diagnosis not present

## 2019-08-31 DIAGNOSIS — I130 Hypertensive heart and chronic kidney disease with heart failure and stage 1 through stage 4 chronic kidney disease, or unspecified chronic kidney disease: Secondary | ICD-10-CM | POA: Diagnosis not present

## 2019-08-31 DIAGNOSIS — I255 Ischemic cardiomyopathy: Secondary | ICD-10-CM | POA: Diagnosis not present

## 2019-08-31 DIAGNOSIS — I214 Non-ST elevation (NSTEMI) myocardial infarction: Secondary | ICD-10-CM | POA: Diagnosis not present

## 2019-08-31 DIAGNOSIS — N184 Chronic kidney disease, stage 4 (severe): Secondary | ICD-10-CM | POA: Diagnosis not present

## 2019-08-31 DIAGNOSIS — G894 Chronic pain syndrome: Secondary | ICD-10-CM | POA: Diagnosis not present

## 2019-08-31 DIAGNOSIS — E1122 Type 2 diabetes mellitus with diabetic chronic kidney disease: Secondary | ICD-10-CM | POA: Diagnosis not present

## 2019-08-31 DIAGNOSIS — E1140 Type 2 diabetes mellitus with diabetic neuropathy, unspecified: Secondary | ICD-10-CM | POA: Diagnosis not present

## 2019-08-31 DIAGNOSIS — I2510 Atherosclerotic heart disease of native coronary artery without angina pectoris: Secondary | ICD-10-CM | POA: Diagnosis not present

## 2019-08-31 DIAGNOSIS — I5022 Chronic systolic (congestive) heart failure: Secondary | ICD-10-CM | POA: Diagnosis not present

## 2019-09-05 ENCOUNTER — Telehealth: Payer: Self-pay | Admitting: Cardiology

## 2019-09-05 DIAGNOSIS — I480 Paroxysmal atrial fibrillation: Secondary | ICD-10-CM | POA: Diagnosis not present

## 2019-09-05 DIAGNOSIS — N184 Chronic kidney disease, stage 4 (severe): Secondary | ICD-10-CM | POA: Diagnosis not present

## 2019-09-05 DIAGNOSIS — I2510 Atherosclerotic heart disease of native coronary artery without angina pectoris: Secondary | ICD-10-CM | POA: Diagnosis not present

## 2019-09-05 DIAGNOSIS — I5022 Chronic systolic (congestive) heart failure: Secondary | ICD-10-CM | POA: Diagnosis not present

## 2019-09-05 DIAGNOSIS — I214 Non-ST elevation (NSTEMI) myocardial infarction: Secondary | ICD-10-CM | POA: Diagnosis not present

## 2019-09-05 DIAGNOSIS — E1122 Type 2 diabetes mellitus with diabetic chronic kidney disease: Secondary | ICD-10-CM | POA: Diagnosis not present

## 2019-09-05 DIAGNOSIS — I130 Hypertensive heart and chronic kidney disease with heart failure and stage 1 through stage 4 chronic kidney disease, or unspecified chronic kidney disease: Secondary | ICD-10-CM | POA: Diagnosis not present

## 2019-09-05 DIAGNOSIS — E1140 Type 2 diabetes mellitus with diabetic neuropathy, unspecified: Secondary | ICD-10-CM | POA: Diagnosis not present

## 2019-09-05 DIAGNOSIS — I255 Ischemic cardiomyopathy: Secondary | ICD-10-CM | POA: Diagnosis not present

## 2019-09-05 MED ORDER — CARVEDILOL 3.125 MG PO TABS: 3.1250 mg | ORAL_TABLET | Freq: Two times a day (BID) | ORAL | 3 refills | Status: DC

## 2019-09-05 MED ORDER — AMIODARONE HCL 100 MG PO TABS: 100.0000 mg | ORAL_TABLET | Freq: Every day | ORAL | 3 refills | Status: DC

## 2019-09-05 NOTE — Telephone Encounter (Signed)
Returned call to pt's daughter [name]. She states that for past 5 days her mother's HR is staying between 49-54. She states that even doing physical therapy, it does not get any higher. She does complain of dizziness often. [name] takes her HR manually as we as with a pulse ox. She is drinking plenty of water throughout the day, so she assumes hydration is not an issue. Please advise.

## 2019-09-05 NOTE — Telephone Encounter (Signed)
Let's have her cut Coreg to 3.125 mg twice daily and amiodarone to 100 mg daily.

## 2019-09-05 NOTE — Telephone Encounter (Signed)
Debra [name] -daughter called stating that patient's HR continues to stay low.  Patient is complaining of having dizzy spells.  [phone]

## 2019-09-05 NOTE — Telephone Encounter (Signed)
Pt's daughter notified and voiced understanding.Sent in new RX to [facility].

## 2019-09-06 DIAGNOSIS — E1122 Type 2 diabetes mellitus with diabetic chronic kidney disease: Secondary | ICD-10-CM | POA: Diagnosis not present

## 2019-09-06 DIAGNOSIS — I2510 Atherosclerotic heart disease of native coronary artery without angina pectoris: Secondary | ICD-10-CM | POA: Diagnosis not present

## 2019-09-06 DIAGNOSIS — I480 Paroxysmal atrial fibrillation: Secondary | ICD-10-CM | POA: Diagnosis not present

## 2019-09-06 DIAGNOSIS — I214 Non-ST elevation (NSTEMI) myocardial infarction: Secondary | ICD-10-CM | POA: Diagnosis not present

## 2019-09-06 DIAGNOSIS — N184 Chronic kidney disease, stage 4 (severe): Secondary | ICD-10-CM | POA: Diagnosis not present

## 2019-09-06 DIAGNOSIS — I255 Ischemic cardiomyopathy: Secondary | ICD-10-CM | POA: Diagnosis not present

## 2019-09-06 DIAGNOSIS — I5022 Chronic systolic (congestive) heart failure: Secondary | ICD-10-CM | POA: Diagnosis not present

## 2019-09-06 DIAGNOSIS — E1140 Type 2 diabetes mellitus with diabetic neuropathy, unspecified: Secondary | ICD-10-CM | POA: Diagnosis not present

## 2019-09-06 DIAGNOSIS — I130 Hypertensive heart and chronic kidney disease with heart failure and stage 1 through stage 4 chronic kidney disease, or unspecified chronic kidney disease: Secondary | ICD-10-CM | POA: Diagnosis not present

## 2019-09-11 ENCOUNTER — Other Ambulatory Visit: Payer: Self-pay

## 2019-09-11 MED ORDER — AMIODARONE HCL 100 MG PO TABS: 100.0000 mg | ORAL_TABLET | Freq: Every day | ORAL | 3 refills | Status: DC

## 2019-09-13 ENCOUNTER — Ambulatory Visit: Payer: Medicare HMO | Admitting: Podiatry

## 2019-09-21 DIAGNOSIS — I5022 Chronic systolic (congestive) heart failure: Secondary | ICD-10-CM | POA: Diagnosis not present

## 2019-09-21 DIAGNOSIS — E1122 Type 2 diabetes mellitus with diabetic chronic kidney disease: Secondary | ICD-10-CM | POA: Diagnosis not present

## 2019-09-21 DIAGNOSIS — E1140 Type 2 diabetes mellitus with diabetic neuropathy, unspecified: Secondary | ICD-10-CM | POA: Diagnosis not present

## 2019-09-21 DIAGNOSIS — I2510 Atherosclerotic heart disease of native coronary artery without angina pectoris: Secondary | ICD-10-CM | POA: Diagnosis not present

## 2019-09-21 DIAGNOSIS — I214 Non-ST elevation (NSTEMI) myocardial infarction: Secondary | ICD-10-CM | POA: Diagnosis not present

## 2019-09-21 DIAGNOSIS — I130 Hypertensive heart and chronic kidney disease with heart failure and stage 1 through stage 4 chronic kidney disease, or unspecified chronic kidney disease: Secondary | ICD-10-CM | POA: Diagnosis not present

## 2019-09-21 DIAGNOSIS — I480 Paroxysmal atrial fibrillation: Secondary | ICD-10-CM | POA: Diagnosis not present

## 2019-09-21 DIAGNOSIS — I255 Ischemic cardiomyopathy: Secondary | ICD-10-CM | POA: Diagnosis not present

## 2019-09-21 DIAGNOSIS — N184 Chronic kidney disease, stage 4 (severe): Secondary | ICD-10-CM | POA: Diagnosis not present

## 2019-10-02 DIAGNOSIS — I255 Ischemic cardiomyopathy: Secondary | ICD-10-CM | POA: Diagnosis not present

## 2019-10-02 DIAGNOSIS — H903 Sensorineural hearing loss, bilateral: Secondary | ICD-10-CM | POA: Diagnosis not present

## 2019-10-02 DIAGNOSIS — I5022 Chronic systolic (congestive) heart failure: Secondary | ICD-10-CM | POA: Diagnosis not present

## 2019-10-02 DIAGNOSIS — I214 Non-ST elevation (NSTEMI) myocardial infarction: Secondary | ICD-10-CM | POA: Diagnosis not present

## 2019-10-02 DIAGNOSIS — N184 Chronic kidney disease, stage 4 (severe): Secondary | ICD-10-CM | POA: Diagnosis not present

## 2019-10-02 DIAGNOSIS — R42 Dizziness and giddiness: Secondary | ICD-10-CM | POA: Diagnosis not present

## 2019-10-02 DIAGNOSIS — I130 Hypertensive heart and chronic kidney disease with heart failure and stage 1 through stage 4 chronic kidney disease, or unspecified chronic kidney disease: Secondary | ICD-10-CM | POA: Diagnosis not present

## 2019-10-02 DIAGNOSIS — I2510 Atherosclerotic heart disease of native coronary artery without angina pectoris: Secondary | ICD-10-CM | POA: Diagnosis not present

## 2019-10-02 DIAGNOSIS — E1140 Type 2 diabetes mellitus with diabetic neuropathy, unspecified: Secondary | ICD-10-CM | POA: Diagnosis not present

## 2019-10-02 DIAGNOSIS — I480 Paroxysmal atrial fibrillation: Secondary | ICD-10-CM | POA: Diagnosis not present

## 2019-10-02 DIAGNOSIS — E1122 Type 2 diabetes mellitus with diabetic chronic kidney disease: Secondary | ICD-10-CM | POA: Diagnosis not present

## 2019-10-02 DIAGNOSIS — H6121 Impacted cerumen, right ear: Secondary | ICD-10-CM | POA: Diagnosis not present

## 2019-10-03 ENCOUNTER — Ambulatory Visit: Payer: Medicare HMO | Admitting: *Deleted

## 2019-10-03 ENCOUNTER — Other Ambulatory Visit: Payer: Self-pay

## 2019-10-03 ENCOUNTER — Other Ambulatory Visit (INDEPENDENT_AMBULATORY_CARE_PROVIDER_SITE_OTHER): Payer: Medicare HMO | Admitting: *Deleted

## 2019-10-03 VITALS — BP 138/72 | HR 61 | Wt 125.4 lb

## 2019-10-03 DIAGNOSIS — I480 Paroxysmal atrial fibrillation: Secondary | ICD-10-CM | POA: Diagnosis not present

## 2019-10-03 DIAGNOSIS — R0602 Shortness of breath: Secondary | ICD-10-CM

## 2019-10-03 DIAGNOSIS — G894 Chronic pain syndrome: Secondary | ICD-10-CM | POA: Diagnosis not present

## 2019-10-03 NOTE — Progress Notes (Signed)
I reviewed her ECG, she is in sinus rhythm with heart rate 60.  We had planned on a visit in the next few months based on her telehealth encounter, but I think if she is still having symptoms let's get her in to see [place] in the next few weeks.  May need to consider resuming Entresto or other changes in her medical regimen, her last creatinine was 1.2 (do not start anything until she is seen).  We already cut back on doses of Coreg and amiodarone due to prior bradycardia.

## 2019-10-03 NOTE — Progress Notes (Signed)
[name] (daughter) notified & agrees to appt.  OV scheduled with Cecilie Kicks, NP for 10/08/2019 in [place] office.

## 2019-10-03 NOTE — Progress Notes (Signed)
Patient walked into office today thinking she had an appointment.  Last visit was January 2021 with Dr. Domenic Polite - telehealth visit.    Stated that she already was having SOB w/ exertion, but now have SOB anytime x last month.  Weakness & fatigue x 6 months - worse the last 1 1/2 months.  Seems like she has never bounced back from being in hospital previously.  No weight gain, does notice weight loss.  No chest pain.   Physical therapist recertified her for another 4-5 weeks.  Physical therapist also wanting to know what her limitations are with the Afib.  EKG scanned into Epic for review.   Informed patient that Dr. Domenic Polite will review this information & give further instructions moving forward.    Please call daughter [name]):  [phone].

## 2019-10-04 ENCOUNTER — Ambulatory Visit: Payer: Medicare HMO | Admitting: Podiatry

## 2019-10-04 VITALS — Temp 97.2°F

## 2019-10-04 DIAGNOSIS — B351 Tinea unguium: Secondary | ICD-10-CM | POA: Diagnosis not present

## 2019-10-04 DIAGNOSIS — E1169 Type 2 diabetes mellitus with other specified complication: Secondary | ICD-10-CM | POA: Diagnosis not present

## 2019-10-04 DIAGNOSIS — M2041 Other hammer toe(s) (acquired), right foot: Secondary | ICD-10-CM

## 2019-10-04 DIAGNOSIS — E1151 Type 2 diabetes mellitus with diabetic peripheral angiopathy without gangrene: Secondary | ICD-10-CM

## 2019-10-04 DIAGNOSIS — M2042 Other hammer toe(s) (acquired), left foot: Secondary | ICD-10-CM

## 2019-10-05 NOTE — Progress Notes (Deleted)
Cardiology Office Note   Date:  10/05/2019   ID:  Debra [name], DOB May 31, 1929, MRN [id number]  PCP:  Sharilyn Sites, MD  Cardiologist:  Dr. Domenic Polite    No chief complaint on file.     History of Present Illness: Debra [name] is a 86 y.o. female who presents for ***  hospitalized in January of this year with symptomatic, rapid atrial fibrillation and associated NSTEMI in the setting of known multivessel CAD status post CABG with graft disease.  She was followed by our cardiology service at [facility] and underwent cardiac catheterization that demonstrated overall stable native coronary and bypass graft anatomy in comparison to 2019 with decision to treat medically.  Follow-up echocardiogram revealed LVEF to 40% range with wall motion abnormalities consistent with ischemic heart disease, also mild to moderate mitral regurgitation.  I reviewed cardiac regimen at discharge from the hospital, Delene Loll was held pending follow-up BMET.  Last creatinine was 1.5 with potassium 4.2 on January 28.  She was in sinus rhythm at hospital discharge.  She reportedly had a follow-up BMET with Larene Pickett last week, we will request the results.  She has had PT at home a few days a week, also trying to walk regularly around her house.  Strength is improving, she has had no obvious palpitations or chest pain.  Today's blood pressure was elevated, on average her systolic has been in the 762G at home.  ? Resume eliquis   Past Medical History:  Diagnosis Date  . Arthritis   . CKD (chronic kidney disease) stage 4, GFR 15-29 ml/min (HCC)   . Colon cancer ([place])   . COPD (chronic obstructive pulmonary disease) ([place])   . Coronary artery disease    a. s/p CABG in 1997 (no detials provided) b. 12/2017: NSTEMI with cath showing patent LIMA-LAD with occluded SVG-OM and presumed occluded SVG-RCA but unable to be visualized, RCA was filled by collaterals. Medical management recommended. c.  07/2019: NSTEMI with rapid  atrial fibrillation, stable coronary and bypass graft anatomy by cardiac catheterization  . DDD (degenerative disc disease), lumbar   . Diabetic neuropathy ([place])   . Essential hypertension   . GERD (gastroesophageal reflux disease)   . Gout   . Hypothyroidism   . Ischemic cardiomyopathy   . Restless leg syndrome   . Type 2 diabetes mellitus ([place])     Past Surgical History:  Procedure Laterality Date  . ABDOMINAL HYSTERECTOMY    . CHOLECYSTECTOMY    . COLON RESECTION    . CORONARY ARTERY BYPASS GRAFT    . ILIAC ARTERY STENT    . LEFT HEART CATH AND CORS/GRAFTS ANGIOGRAPHY N/A 12/28/2017   Procedure: LEFT HEART CATH AND CORS/GRAFTS ANGIOGRAPHY;  Surgeon: Martinique, Peter M, MD;  Location: [place] [facility];  Service: Cardiovascular;  Laterality: N/A;  . LEFT HEART CATH AND CORS/GRAFTS ANGIOGRAPHY N/A 08/01/2019   Procedure: LEFT HEART CATH AND CORS/GRAFTS ANGIOGRAPHY;  Surgeon: Burnell Blanks, MD;  Location: [place] [facility];  Service: Cardiovascular;  Laterality: N/A;  . LUNG SURGERY Left 2012   left lung resection  . RENAL ARTERY STENT    . THYROID SURGERY    . ULTRASOUND GUIDANCE FOR VASCULAR ACCESS  12/28/2017   Procedure: Ultrasound Guidance For Vascular Access;  Surgeon: Martinique, Peter M, MD;  Location: [place] [facility];  Service: Cardiovascular;;     Current Outpatient Medications  Medication Sig Dispense Refill  . acetaminophen (TYLENOL) 325 MG tablet Take 650 mg by  mouth every 6 (six) hours as needed for mild pain.    Marland Kitchen albuterol (VENTOLIN HFA) 108 (90 Base) MCG/ACT inhaler Inhale 1 puff into the lungs every 6 (six) hours as needed for shortness of breath or wheezing.    Marland Kitchen amiodarone (PACERONE) 100 MG tablet Take 1 tablet (100 mg total) by mouth daily. (Patient taking differently: Take 50 mg by mouth 2 (two) times daily. ) 90 tablet 3  . apixaban (ELIQUIS) 2.5 MG TABS tablet Take 1 tablet (2.5 mg total) by mouth 2 (two) times daily. 180 tablet 1  . atorvastatin  (LIPITOR) 80 MG tablet Take 1 tablet (80 mg total) by mouth daily at 6 PM. 30 tablet 6  . carvedilol (COREG) 3.125 MG tablet Take 1 tablet (3.125 mg total) by mouth 2 (two) times daily. (Patient taking differently: Take 3.125 mg by mouth daily. Taking 1/2 tablet) 180 tablet 3  . Cholecalciferol (VITAMIN D) 125 MCG (5000 UT) CAPS Take 1 capsule by mouth daily.    . diazepam (VALIUM) 2 MG tablet Take 2 mg by mouth daily as needed.    . gabapentin (NEURONTIN) 100 MG capsule Take 100 mg by mouth 3 (three) times daily.     Marland Kitchen HYDROcodone-acetaminophen (NORCO) 10-325 MG tablet Take 1 tablet by mouth every 6 (six) hours as needed for moderate pain or severe pain. 1 tab po hs/ and prn.    . insulin glargine (LANTUS) 100 unit/mL SOPN Inject 0.1 mLs (10 Units total) into the skin at bedtime.    Marland Kitchen levothyroxine (SYNTHROID) 50 MCG tablet Take 1 tablet (50 mcg total) by mouth daily at 6 (six) AM. 30 tablet 2  . Lidocaine HCl-Benzyl Alcohol (SALONPAS LIDOCAINE PLUS) 4-10 % LIQD Apply 1 application topically as needed.    . Menthol, Topical Analgesic, (ASPERCREME MAX ROLL-ON) 16 % LIQD Apply 1 application topically as needed.    . pantoprazole (PROTONIX) 40 MG tablet Take 40 mg by mouth daily.     . polyethylene glycol (MIRALAX / GLYCOLAX) 17 g packet Take 17 g by mouth daily as needed for mild constipation. 14 each 0  . rOPINIRole (REQUIP) 2 MG tablet Take 2 mg by mouth at bedtime.     . saccharomyces boulardii (FLORASTOR) 250 MG capsule Take 1 capsule (250 mg total) by mouth 2 (two) times daily.     No current facility-administered medications for this visit.    Allergies:   Ciprofloxacin, Iodine, and Iodinated diagnostic agents    Social History:  The patient  reports that she quit smoking about 23 years ago. Her smoking use included cigarettes. She has never used smokeless tobacco. She reports that she does not drink alcohol or use drugs.   Family History:  The patient's ***family history includes Breast  cancer in her daughter; Colon cancer in her mother; Heart attack in her sister; Heart disease in her father.    ROS:  General:no colds or fevers, no weight changes Skin:no rashes or ulcers HEENT:no blurred vision, no congestion CV:see HPI PUL:see HPI GI:no diarrhea constipation or melena, no indigestion GU:no hematuria, no dysuria MS:no joint pain, no claudication Neuro:no syncope, no lightheadedness Endo:no diabetes, no thyroid disease Wt Readings from Last 3 Encounters:  10/03/19 125 lb 6.4 oz (56.9 kg)  08/24/19 122 lb 9.6 oz (55.6 kg)  07/31/19 119 lb 3.2 oz (54.1 kg)     PHYSICAL EXAM: VS:  There were no vitals taken for this visit. , BMI There is no height or weight on file to  calculate BMI. General:Pleasant affect, NAD Skin:Warm and dry, brisk capillary refill HEENT:normocephalic, sclera clear, mucus membranes moist Neck:supple, no JVD, no bruits  Heart:S1S2 RRR without murmur, gallup, rub or click Lungs:clear without rales, rhonchi, or wheezes OVZ:CHYI, non tender, + BS, do not palpate liver spleen or masses Ext:no lower ext edema, 2+ pedal pulses, 2+ radial pulses Neuro:alert and oriented, MAE, follows commands, + facial symmetry    EKG:  EKG is ordered today. The ekg ordered today demonstrates ***   Recent Labs: 07/30/2019: ALT 13; TSH 0.019 08/01/2019: Hemoglobin 11.0; Magnesium 2.0; Platelets 207 08/02/2019: BUN 36; Creatinine, Ser 1.50; Potassium 4.2; Sodium 137    Lipid Panel    Component Value Date/Time   CHOL 89 12/28/2017 0334   TRIG 73 12/28/2017 0334   HDL 36 (L) 12/28/2017 0334   CHOLHDL 2.5 12/28/2017 0334   VLDL 15 12/28/2017 0334   LDLCALC 38 12/28/2017 0334       Other studies Reviewed: Additional studies/ records that were reviewed today include: ***. Cardiac catheterization 08/01/2019:  Ost LAD to Prox LAD lesion is 100% stenosed.  Ost 1st Mrg to 1st Mrg lesion is 40% stenosed.  Ost 2nd Mrg lesion is 30% stenosed.  Ost 3rd Mrg  lesion is 90% stenosed.  Ost RCA to Dist RCA lesion is 100% stenosed.  LIMA and is normal in caliber.  The graft exhibits no disease.  SVG.  Origin to Prox Graft lesion is 100% stenosed.  SVG due to known occlusion.  Origin to Prox Graft lesion is 100% stenosed.  Prox Cx to Mid Cx lesion is 10% stenosed.  1. Triple vessel CAD s/p CABG with 1 patent bypass graft 2. Occluded proximal LAD. Patent LIMA to mid LAD 3. Known occlusion of the SVG to OM. The mid Circumflex stent is patent. Severe disease in a small caliber OM branch that fills from left to left collaterals. Unchanged from last cath.  4. Chronic occlusion of the proximal RCA. Known occlusion of the vein graft to the distal RCA. The distal RCA fills from left to right collaterals.   Recommendations: Coronary disease unchanged from last cath in 2019. Continue medical management of CAD.   Echocardiogram 07/31/2019: 1. Left ventricular ejection fraction, by visual estimation, is 35 to  40%. The left ventricle has moderately decreased function. There is no  left ventricular hypertrophy.  2. Mild dyskinesis of the left ventricular, basal-mid inferoseptal wall  and inferior wall (consistent with previous infarction in the right  coronary artery distribution).  3. Severe hypokinesis of the left ventricular, basal anteroseptal wall.  The apical segments contract normally (consistent with occlusion of the  proximal LAD artery with patent graft to the distal vessel).  4. Abnormal septal motion consistent with post-operative status.  5. Elevated left atrial pressure.  6. Left ventricular diastolic parameters are consistent with Grade II  diastolic dysfunction (pseudonormalization).  7. The left ventricle demonstrates regional wall motion abnormalities.  8. Global right ventricle has normal systolic function.The right  ventricular size is normal. No increase in right ventricular wall  thickness.  9. Left atrial size  was mildly dilated.  10. Right atrial size was normal.  11. Mild mitral annular calcification.  12. The mitral valve is normal in structure. Mild to moderate mitral valve  regurgitation.  13. The tricuspid valve is normal in structure.  14. The tricuspid valve is normal in structure. Tricuspid valve  regurgitation is not demonstrated.  15. The aortic valve is normal in structure. Aortic valve regurgitation  is  not visualized. Mild aortic valve sclerosis without stenosis.  16. The pulmonic valve was grossly normal. Pulmonic valve regurgitation is  trivial.  17. TR signal is inadequate for assessing pulmonary artery systolic  pressure.  18. The inferior vena cava is dilated in size with <50% respiratory  variability, suggesting right atrial pressure of 15 mmHg.  19. No significant change from prior study  ASSESSMENT AND PLAN:  1.  ***   Current medicines are reviewed with the patient today.  The patient Has no concerns regarding medicines.  The following changes have been made:  See above Labs/ tests ordered today include:see above  Disposition:   FU:  see above  Signed, Cecilie Kicks, NP  10/05/2019 4:46 PM    [facility], [place], [place], [place] Phone: [phone]; Fax: [phone]

## 2019-10-06 DIAGNOSIS — I2510 Atherosclerotic heart disease of native coronary artery without angina pectoris: Secondary | ICD-10-CM | POA: Diagnosis not present

## 2019-10-06 DIAGNOSIS — I252 Old myocardial infarction: Secondary | ICD-10-CM | POA: Diagnosis not present

## 2019-10-06 DIAGNOSIS — I255 Ischemic cardiomyopathy: Secondary | ICD-10-CM | POA: Diagnosis not present

## 2019-10-06 DIAGNOSIS — I5022 Chronic systolic (congestive) heart failure: Secondary | ICD-10-CM | POA: Diagnosis not present

## 2019-10-06 DIAGNOSIS — E1122 Type 2 diabetes mellitus with diabetic chronic kidney disease: Secondary | ICD-10-CM | POA: Diagnosis not present

## 2019-10-06 DIAGNOSIS — E1140 Type 2 diabetes mellitus with diabetic neuropathy, unspecified: Secondary | ICD-10-CM | POA: Diagnosis not present

## 2019-10-06 DIAGNOSIS — N184 Chronic kidney disease, stage 4 (severe): Secondary | ICD-10-CM | POA: Diagnosis not present

## 2019-10-06 DIAGNOSIS — I130 Hypertensive heart and chronic kidney disease with heart failure and stage 1 through stage 4 chronic kidney disease, or unspecified chronic kidney disease: Secondary | ICD-10-CM | POA: Diagnosis not present

## 2019-10-06 DIAGNOSIS — I480 Paroxysmal atrial fibrillation: Secondary | ICD-10-CM | POA: Diagnosis not present

## 2019-10-08 ENCOUNTER — Ambulatory Visit: Payer: Medicare HMO | Admitting: Cardiology

## 2019-10-09 DIAGNOSIS — I2510 Atherosclerotic heart disease of native coronary artery without angina pectoris: Secondary | ICD-10-CM | POA: Diagnosis not present

## 2019-10-09 DIAGNOSIS — I480 Paroxysmal atrial fibrillation: Secondary | ICD-10-CM | POA: Diagnosis not present

## 2019-10-09 DIAGNOSIS — I5022 Chronic systolic (congestive) heart failure: Secondary | ICD-10-CM | POA: Diagnosis not present

## 2019-10-09 DIAGNOSIS — E1122 Type 2 diabetes mellitus with diabetic chronic kidney disease: Secondary | ICD-10-CM | POA: Diagnosis not present

## 2019-10-09 DIAGNOSIS — I130 Hypertensive heart and chronic kidney disease with heart failure and stage 1 through stage 4 chronic kidney disease, or unspecified chronic kidney disease: Secondary | ICD-10-CM | POA: Diagnosis not present

## 2019-10-09 DIAGNOSIS — E1140 Type 2 diabetes mellitus with diabetic neuropathy, unspecified: Secondary | ICD-10-CM | POA: Diagnosis not present

## 2019-10-09 DIAGNOSIS — I255 Ischemic cardiomyopathy: Secondary | ICD-10-CM | POA: Diagnosis not present

## 2019-10-09 DIAGNOSIS — I252 Old myocardial infarction: Secondary | ICD-10-CM | POA: Diagnosis not present

## 2019-10-09 DIAGNOSIS — N184 Chronic kidney disease, stage 4 (severe): Secondary | ICD-10-CM | POA: Diagnosis not present

## 2019-10-11 ENCOUNTER — Ambulatory Visit: Payer: Medicare HMO | Admitting: Student

## 2019-10-11 DIAGNOSIS — I130 Hypertensive heart and chronic kidney disease with heart failure and stage 1 through stage 4 chronic kidney disease, or unspecified chronic kidney disease: Secondary | ICD-10-CM | POA: Diagnosis not present

## 2019-10-11 DIAGNOSIS — E1122 Type 2 diabetes mellitus with diabetic chronic kidney disease: Secondary | ICD-10-CM | POA: Diagnosis not present

## 2019-10-11 DIAGNOSIS — N184 Chronic kidney disease, stage 4 (severe): Secondary | ICD-10-CM | POA: Diagnosis not present

## 2019-10-11 DIAGNOSIS — I255 Ischemic cardiomyopathy: Secondary | ICD-10-CM | POA: Diagnosis not present

## 2019-10-11 DIAGNOSIS — I480 Paroxysmal atrial fibrillation: Secondary | ICD-10-CM | POA: Diagnosis not present

## 2019-10-11 DIAGNOSIS — E1140 Type 2 diabetes mellitus with diabetic neuropathy, unspecified: Secondary | ICD-10-CM | POA: Diagnosis not present

## 2019-10-11 DIAGNOSIS — I5022 Chronic systolic (congestive) heart failure: Secondary | ICD-10-CM | POA: Diagnosis not present

## 2019-10-11 DIAGNOSIS — I2510 Atherosclerotic heart disease of native coronary artery without angina pectoris: Secondary | ICD-10-CM | POA: Diagnosis not present

## 2019-10-11 DIAGNOSIS — I252 Old myocardial infarction: Secondary | ICD-10-CM | POA: Diagnosis not present

## 2019-10-12 ENCOUNTER — Ambulatory Visit: Payer: Medicare HMO | Admitting: Cardiology

## 2019-10-12 ENCOUNTER — Other Ambulatory Visit: Payer: Self-pay

## 2019-10-12 ENCOUNTER — Encounter: Payer: Self-pay | Admitting: Cardiology

## 2019-10-12 VITALS — BP 130/84 | HR 66 | Temp 96.5°F | Wt 123.0 lb

## 2019-10-12 DIAGNOSIS — I255 Ischemic cardiomyopathy: Secondary | ICD-10-CM

## 2019-10-12 DIAGNOSIS — I25119 Atherosclerotic heart disease of native coronary artery with unspecified angina pectoris: Secondary | ICD-10-CM | POA: Diagnosis not present

## 2019-10-12 DIAGNOSIS — I480 Paroxysmal atrial fibrillation: Secondary | ICD-10-CM | POA: Diagnosis not present

## 2019-10-12 DIAGNOSIS — N1832 Chronic kidney disease, stage 3b: Secondary | ICD-10-CM | POA: Diagnosis not present

## 2019-10-12 MED ORDER — ENTRESTO 24-26 MG PO TABS: 1.0000 | ORAL_TABLET | Freq: Two times a day (BID) | ORAL | 6 refills | Status: DC

## 2019-10-12 NOTE — Progress Notes (Signed)
Cardiology Office Note  Date: 10/12/2019   ID: [name], DOB 1928-10-14, MRN [id number]  PCP:  Debra Sites, MD  Cardiologist:  Debra Lesches, MD Electrophysiologist:  None   Chief Complaint  Patient presents with  . Cardiac follow-up    History of Present Illness: Debra [name] is an 86 y.o. female last assessed via telehealth encounter in February.  She is here today with her daughter for a follow-up visit.  She has had relative fatigue and dyspnea on exertion, no fluid gain however or increasing weight.  Still working with home PT and trying to increase her activity.  She was started on a course of steroids recently by PCP for arthritic pain as well.  Her heart rates have been relatively slow and not picking up much with activity, staying around 60.  She does not describe any angina symptoms, no orthopnea or PND.  We went over her medications today and discussed stopping Coreg completely, but trying to keep her on low-dose amiodarone to suppress recurring atrial fibrillation.  She is tolerating Eliquis without bleeding problems.  We also discussed resumption of Entresto if tolerated.  I reviewed her lab work from February as outlined below.  Past Medical History:  Diagnosis Date  . Arthritis   . CKD (chronic kidney disease) stage 4, GFR 15-29 ml/min (HCC)   . Colon cancer ([place])   . COPD (chronic obstructive pulmonary disease) ([place])   . Coronary artery disease    a. s/p CABG in 1997 (no detials provided) b. 12/2017: NSTEMI with cath showing patent LIMA-LAD with occluded SVG-OM and presumed occluded SVG-RCA but unable to be visualized, RCA was filled by collaterals. Medical management recommended. c.  07/2019: NSTEMI with rapid atrial fibrillation, stable coronary and bypass graft anatomy by cardiac catheterization  . DDD (degenerative disc disease), lumbar   . Diabetic neuropathy ([place])   . Essential hypertension   . GERD (gastroesophageal reflux disease)   . Gout   .  Hypothyroidism   . Ischemic cardiomyopathy   . Restless leg syndrome   . Type 2 diabetes mellitus ([place])     Past Surgical History:  Procedure Laterality Date  . ABDOMINAL HYSTERECTOMY    . CHOLECYSTECTOMY    . COLON RESECTION    . CORONARY ARTERY BYPASS GRAFT    . ILIAC ARTERY STENT    . LEFT HEART CATH AND CORS/GRAFTS ANGIOGRAPHY N/A 12/28/2017   Procedure: LEFT HEART CATH AND CORS/GRAFTS ANGIOGRAPHY;  Surgeon: Martinique, Peter M, MD;  Location: [place] [facility];  Service: Cardiovascular;  Laterality: N/A;  . LEFT HEART CATH AND CORS/GRAFTS ANGIOGRAPHY N/A 08/01/2019   Procedure: LEFT HEART CATH AND CORS/GRAFTS ANGIOGRAPHY;  Surgeon: Debra Blanks, MD;  Location: [place] [facility];  Service: Cardiovascular;  Laterality: N/A;  . LUNG SURGERY Left 2012   left lung resection  . RENAL ARTERY STENT    . THYROID SURGERY    . ULTRASOUND GUIDANCE FOR VASCULAR ACCESS  12/28/2017   Procedure: Ultrasound Guidance For Vascular Access;  Surgeon: Martinique, Peter M, MD;  Location: [place] [facility];  Service: Cardiovascular;;    Current Outpatient Medications  Medication Sig Dispense Refill  . acetaminophen (TYLENOL) 325 MG tablet Take 650 mg by mouth every 6 (six) hours as needed for mild pain.    Marland Kitchen albuterol (VENTOLIN HFA) 108 (90 Base) MCG/ACT inhaler Inhale 1 puff into the lungs every 6 (six) hours as needed for shortness of breath or wheezing.    Marland Kitchen  amiodarone (PACERONE) 100 MG tablet Take 1 tablet (100 mg total) by mouth daily. (Patient taking differently: Take 50 mg by mouth 2 (two) times daily. ) 90 tablet 3  . apixaban (ELIQUIS) 2.5 MG TABS tablet Take 1 tablet (2.5 mg total) by mouth 2 (two) times daily. 180 tablet 1  . atorvastatin (LIPITOR) 80 MG tablet Take 1 tablet (80 mg total) by mouth daily at 6 PM. 30 tablet 6  . Cholecalciferol (VITAMIN D) 125 MCG (5000 UT) CAPS Take 1 capsule by mouth daily.    . diazepam (VALIUM) 2 MG tablet Take 2 mg by mouth daily as needed.    .  gabapentin (NEURONTIN) 100 MG capsule Take 100 mg by mouth 3 (three) times daily.     Marland Kitchen HYDROcodone-acetaminophen (NORCO) 10-325 MG tablet Take 1 tablet by mouth every 6 (six) hours as needed for moderate pain or severe pain. 1 tab po hs/ and prn.    . insulin glargine (LANTUS) 100 unit/mL SOPN Inject 0.1 mLs (10 Units total) into the skin at bedtime.    Marland Kitchen levothyroxine (SYNTHROID) 50 MCG tablet Take 1 tablet (50 mcg total) by mouth daily at 6 (six) AM. 30 tablet 2  . Lidocaine HCl-Benzyl Alcohol (SALONPAS LIDOCAINE PLUS) 4-10 % LIQD Apply 1 application topically as needed.    . Menthol, Topical Analgesic, (ASPERCREME MAX ROLL-ON) 16 % LIQD Apply 1 application topically as needed.    . pantoprazole (PROTONIX) 40 MG tablet Take 40 mg by mouth daily.     . polyethylene glycol (MIRALAX / GLYCOLAX) 17 g packet Take 17 g by mouth daily as needed for mild constipation. 14 each 0  . rOPINIRole (REQUIP) 2 MG tablet Take 2 mg by mouth at bedtime.     . saccharomyces boulardii (FLORASTOR) 250 MG capsule Take 1 capsule (250 mg total) by mouth 2 (two) times daily.    . sacubitril-valsartan (ENTRESTO) 24-26 MG Take 1 tablet by mouth 2 (two) times daily. 60 tablet 6   No current facility-administered medications for this visit.   Allergies:  Ciprofloxacin, Iodine, and Iodinated diagnostic agents   ROS:   No falls or syncope.  Physical Exam: VS:  BP 130/84   Pulse 66   Temp (!) 96.5 F (35.8 C)   Wt 123 lb (55.8 kg)   SpO2 95%   BMI 19.26 kg/[name] , BMI Body mass index is 19.26 kg/[name].  Wt Readings from Last 3 Encounters:  10/12/19 123 lb (55.8 kg)  10/03/19 125 lb 6.4 oz (56.9 kg)  08/24/19 122 lb 9.6 oz (55.6 kg)    General: Elderly woman, appears comfortable at rest.  Using a rolling walker. HEENT: Conjunctiva and lids normal, wearing a mask. Neck: Supple, no elevated JVP or carotid bruits, no thyromegaly. Lungs: Clear to auscultation, nonlabored breathing at rest. Cardiac: Regular rate and  rhythm, no S3, soft systolic murmur, no pericardial rub. Abdomen: Soft, nontender, bowel sounds present. Extremities: No pitting edema, distal pulses 2+.  ECG:  An ECG dated 10/03/2019 was personally reviewed today and demonstrated:  Normal sinus rhythm with rightward axis.  Recent Labwork: 07/30/2019: ALT 13; AST 17; TSH 0.019 08/01/2019: Hemoglobin 11.0; Magnesium 2.0; Platelets 207 08/02/2019: BUN 36; Creatinine, Ser 1.50; Potassium 4.2; Sodium 137     Component Value Date/Time   CHOL 89 12/28/2017 0334   TRIG 73 12/28/2017 0334   HDL 36 (L) 12/28/2017 0334   CHOLHDL 2.5 12/28/2017 0334   VLDL 15 12/28/2017 0334   LDLCALC 38 12/28/2017 0334  February 2021: BUN 22, creatinine 1.27, potassium 5.0, TSH 1.35, hemoglobin A1c 6.1%, cholesterol 112, triglycerides 57, HDL 45, LDL 54, hemoglobin 12.2  Other Studies Reviewed Today:  Cardiac catheterization 08/01/2019:  Ost LAD to Prox LAD lesion is 100% stenosed.  Ost 1st Mrg to 1st Mrg lesion is 40% stenosed.  Ost 2nd Mrg lesion is 30% stenosed.  Ost 3rd Mrg lesion is 90% stenosed.  Ost RCA to Dist RCA lesion is 100% stenosed.  LIMA and is normal in caliber.  The graft exhibits no disease.  SVG.  Origin to Prox Graft lesion is 100% stenosed.  SVG due to known occlusion.  Origin to Prox Graft lesion is 100% stenosed.  Prox Cx to Mid Cx lesion is 10% stenosed.  1. Triple vessel CAD s/p CABG with 1 patent bypass graft 2. Occluded proximal LAD. Patent LIMA to mid LAD 3. Known occlusion of the SVG to OM. The mid Circumflex stent is patent. Severe disease in a small caliber OM branch that fills from left to left collaterals. Unchanged from last cath.  4. Chronic occlusion of the proximal RCA. Known occlusion of the vein graft to the distal RCA. The distal RCA fills from left to right collaterals.   Recommendations: Coronary disease unchanged from last cath in 2019. Continue medical management of CAD.   Echocardiogram  07/31/2019: 1. Left ventricular ejection fraction, by visual estimation, is 35 to  40%. The left ventricle has moderately decreased function. There is no  left ventricular hypertrophy.  2. Mild dyskinesis of the left ventricular, basal-mid inferoseptal wall  and inferior wall (consistent with previous infarction in the right  coronary artery distribution).  3. Severe hypokinesis of the left ventricular, basal anteroseptal wall.  The apical segments contract normally (consistent with occlusion of the  proximal LAD artery with patent graft to the distal vessel).  4. Abnormal septal motion consistent with post-operative status.  5. Elevated left atrial pressure.  6. Left ventricular diastolic parameters are consistent with Grade II  diastolic dysfunction (pseudonormalization).  7. The left ventricle demonstrates regional wall motion abnormalities.  8. Global right ventricle has normal systolic function.The right  ventricular size is normal. No increase in right ventricular wall  thickness.  9. Left atrial size was mildly dilated.  10. Right atrial size was normal.  11. Mild mitral annular calcification.  12. The mitral valve is normal in structure. Mild to moderate mitral valve  regurgitation.  13. The tricuspid valve is normal in structure.  14. The tricuspid valve is normal in structure. Tricuspid valve  regurgitation is not demonstrated.  15. The aortic valve is normal in structure. Aortic valve regurgitation is  not visualized. Mild aortic valve sclerosis without stenosis.  16. The pulmonic valve was grossly normal. Pulmonic valve regurgitation is  trivial.  17. TR signal is inadequate for assessing pulmonary artery systolic  pressure.  18. The inferior vena cava is dilated in size with <50% respiratory  variability, suggesting right atrial pressure of 15 mmHg.  19. No significant change from prior study.  20. Prior images reviewed side by side.   Assessment and Plan:   1.  Paroxysmal atrial fibrillation with CHA2DS2-VASc score of 7.  Continue Eliquis for stroke prophylaxis, appropriately dosed based on weight and age.  She seems to be limited by bradycardia in sinus rhythm we will stop her Coreg for now but try to keep her on low-dose amiodarone for rhythm suppression.  2.  Ischemic cardiomyopathy with LVEF 35 to 40% range.  Stopping  Coreg due to symptomatic bradycardia, we will try to reinitiate Entresto 24/26 mg twice daily with follow-up BMET in the next 7 to 10 days.  She is not on a standing diuretic at this time.  3.  CKD stage IIIb, follow-up creatinine 1.27 with potassium 5.0 in February.  Medication Adjustments/Labs and Tests Ordered: Current medicines are reviewed at length with the patient today.  Concerns regarding medicines are outlined above.   Tests Ordered: Orders Placed This Encounter  Procedures  . Basic Metabolic Panel (BMET)    Medication Changes: Meds ordered this encounter  Medications  . sacubitril-valsartan (ENTRESTO) 24-26 MG    Sig: Take 1 tablet by mouth 2 (two) times daily.    Dispense:  60 tablet    Refill:  6    Disposition:  Follow up 6 to 8 weeks with [place] in the [place] office.  Signed, [name], MD, [facility] 10/12/2019 8:42 AM    [facility] [address] [place], [place] [address] Phone: [phone]; Fax: [phone]

## 2019-10-12 NOTE — Patient Instructions (Signed)
Medication Instructions:  STOP COREG  START ENTRESTO 24/26 MG - TWO TIMES DAILY   Labwork: 7-10 DAYS  BMET  Testing/Procedures: NONE  Follow-Up: Your physician recommends that you schedule a follow-up appointment in: 6-8 WEEKS    Any Other Special Instructions Will Be Listed Below (If Applicable).     If you need a refill on your cardiac medications before your next appointment, please call your pharmacy.

## 2019-10-15 DIAGNOSIS — I2510 Atherosclerotic heart disease of native coronary artery without angina pectoris: Secondary | ICD-10-CM | POA: Diagnosis not present

## 2019-10-15 DIAGNOSIS — E1140 Type 2 diabetes mellitus with diabetic neuropathy, unspecified: Secondary | ICD-10-CM | POA: Diagnosis not present

## 2019-10-15 DIAGNOSIS — I255 Ischemic cardiomyopathy: Secondary | ICD-10-CM | POA: Diagnosis not present

## 2019-10-15 DIAGNOSIS — E1122 Type 2 diabetes mellitus with diabetic chronic kidney disease: Secondary | ICD-10-CM | POA: Diagnosis not present

## 2019-10-15 DIAGNOSIS — I480 Paroxysmal atrial fibrillation: Secondary | ICD-10-CM | POA: Diagnosis not present

## 2019-10-15 DIAGNOSIS — I252 Old myocardial infarction: Secondary | ICD-10-CM | POA: Diagnosis not present

## 2019-10-15 DIAGNOSIS — I130 Hypertensive heart and chronic kidney disease with heart failure and stage 1 through stage 4 chronic kidney disease, or unspecified chronic kidney disease: Secondary | ICD-10-CM | POA: Diagnosis not present

## 2019-10-15 DIAGNOSIS — I5022 Chronic systolic (congestive) heart failure: Secondary | ICD-10-CM | POA: Diagnosis not present

## 2019-10-15 DIAGNOSIS — N184 Chronic kidney disease, stage 4 (severe): Secondary | ICD-10-CM | POA: Diagnosis not present

## 2019-10-16 ENCOUNTER — Ambulatory Visit: Payer: Medicare HMO | Admitting: Physician Assistant

## 2019-10-18 DIAGNOSIS — I130 Hypertensive heart and chronic kidney disease with heart failure and stage 1 through stage 4 chronic kidney disease, or unspecified chronic kidney disease: Secondary | ICD-10-CM | POA: Diagnosis not present

## 2019-10-18 DIAGNOSIS — I252 Old myocardial infarction: Secondary | ICD-10-CM | POA: Diagnosis not present

## 2019-10-18 DIAGNOSIS — I2510 Atherosclerotic heart disease of native coronary artery without angina pectoris: Secondary | ICD-10-CM | POA: Diagnosis not present

## 2019-10-18 DIAGNOSIS — I480 Paroxysmal atrial fibrillation: Secondary | ICD-10-CM | POA: Diagnosis not present

## 2019-10-18 DIAGNOSIS — E1140 Type 2 diabetes mellitus with diabetic neuropathy, unspecified: Secondary | ICD-10-CM | POA: Diagnosis not present

## 2019-10-18 DIAGNOSIS — E1122 Type 2 diabetes mellitus with diabetic chronic kidney disease: Secondary | ICD-10-CM | POA: Diagnosis not present

## 2019-10-18 DIAGNOSIS — N184 Chronic kidney disease, stage 4 (severe): Secondary | ICD-10-CM | POA: Diagnosis not present

## 2019-10-18 DIAGNOSIS — I255 Ischemic cardiomyopathy: Secondary | ICD-10-CM | POA: Diagnosis not present

## 2019-10-18 DIAGNOSIS — I5022 Chronic systolic (congestive) heart failure: Secondary | ICD-10-CM | POA: Diagnosis not present

## 2019-10-22 DIAGNOSIS — N184 Chronic kidney disease, stage 4 (severe): Secondary | ICD-10-CM | POA: Diagnosis not present

## 2019-10-22 DIAGNOSIS — E1122 Type 2 diabetes mellitus with diabetic chronic kidney disease: Secondary | ICD-10-CM | POA: Diagnosis not present

## 2019-10-22 DIAGNOSIS — I130 Hypertensive heart and chronic kidney disease with heart failure and stage 1 through stage 4 chronic kidney disease, or unspecified chronic kidney disease: Secondary | ICD-10-CM | POA: Diagnosis not present

## 2019-10-22 DIAGNOSIS — I255 Ischemic cardiomyopathy: Secondary | ICD-10-CM | POA: Diagnosis not present

## 2019-10-22 DIAGNOSIS — I5022 Chronic systolic (congestive) heart failure: Secondary | ICD-10-CM | POA: Diagnosis not present

## 2019-10-22 DIAGNOSIS — I2510 Atherosclerotic heart disease of native coronary artery without angina pectoris: Secondary | ICD-10-CM | POA: Diagnosis not present

## 2019-10-22 DIAGNOSIS — I252 Old myocardial infarction: Secondary | ICD-10-CM | POA: Diagnosis not present

## 2019-10-22 DIAGNOSIS — E1140 Type 2 diabetes mellitus with diabetic neuropathy, unspecified: Secondary | ICD-10-CM | POA: Diagnosis not present

## 2019-10-22 DIAGNOSIS — I480 Paroxysmal atrial fibrillation: Secondary | ICD-10-CM | POA: Diagnosis not present

## 2019-10-23 DIAGNOSIS — I480 Paroxysmal atrial fibrillation: Secondary | ICD-10-CM | POA: Diagnosis not present

## 2019-10-23 DIAGNOSIS — I5022 Chronic systolic (congestive) heart failure: Secondary | ICD-10-CM | POA: Diagnosis not present

## 2019-10-23 DIAGNOSIS — I252 Old myocardial infarction: Secondary | ICD-10-CM | POA: Diagnosis not present

## 2019-10-23 DIAGNOSIS — I130 Hypertensive heart and chronic kidney disease with heart failure and stage 1 through stage 4 chronic kidney disease, or unspecified chronic kidney disease: Secondary | ICD-10-CM | POA: Diagnosis not present

## 2019-10-24 DIAGNOSIS — I252 Old myocardial infarction: Secondary | ICD-10-CM | POA: Diagnosis not present

## 2019-10-24 DIAGNOSIS — I130 Hypertensive heart and chronic kidney disease with heart failure and stage 1 through stage 4 chronic kidney disease, or unspecified chronic kidney disease: Secondary | ICD-10-CM | POA: Diagnosis not present

## 2019-10-24 DIAGNOSIS — I5022 Chronic systolic (congestive) heart failure: Secondary | ICD-10-CM | POA: Diagnosis not present

## 2019-10-24 DIAGNOSIS — I255 Ischemic cardiomyopathy: Secondary | ICD-10-CM | POA: Diagnosis not present

## 2019-10-24 DIAGNOSIS — I480 Paroxysmal atrial fibrillation: Secondary | ICD-10-CM | POA: Diagnosis not present

## 2019-10-24 DIAGNOSIS — N184 Chronic kidney disease, stage 4 (severe): Secondary | ICD-10-CM | POA: Diagnosis not present

## 2019-10-24 DIAGNOSIS — I2510 Atherosclerotic heart disease of native coronary artery without angina pectoris: Secondary | ICD-10-CM | POA: Diagnosis not present

## 2019-10-24 DIAGNOSIS — E1140 Type 2 diabetes mellitus with diabetic neuropathy, unspecified: Secondary | ICD-10-CM | POA: Diagnosis not present

## 2019-10-24 DIAGNOSIS — E1122 Type 2 diabetes mellitus with diabetic chronic kidney disease: Secondary | ICD-10-CM | POA: Diagnosis not present

## 2019-10-29 DIAGNOSIS — I255 Ischemic cardiomyopathy: Secondary | ICD-10-CM | POA: Diagnosis not present

## 2019-10-29 DIAGNOSIS — I2510 Atherosclerotic heart disease of native coronary artery without angina pectoris: Secondary | ICD-10-CM | POA: Diagnosis not present

## 2019-10-29 DIAGNOSIS — N184 Chronic kidney disease, stage 4 (severe): Secondary | ICD-10-CM | POA: Diagnosis not present

## 2019-10-29 DIAGNOSIS — I130 Hypertensive heart and chronic kidney disease with heart failure and stage 1 through stage 4 chronic kidney disease, or unspecified chronic kidney disease: Secondary | ICD-10-CM | POA: Diagnosis not present

## 2019-10-29 DIAGNOSIS — I252 Old myocardial infarction: Secondary | ICD-10-CM | POA: Diagnosis not present

## 2019-10-29 DIAGNOSIS — E1140 Type 2 diabetes mellitus with diabetic neuropathy, unspecified: Secondary | ICD-10-CM | POA: Diagnosis not present

## 2019-10-29 DIAGNOSIS — I480 Paroxysmal atrial fibrillation: Secondary | ICD-10-CM | POA: Diagnosis not present

## 2019-10-29 DIAGNOSIS — E1122 Type 2 diabetes mellitus with diabetic chronic kidney disease: Secondary | ICD-10-CM | POA: Diagnosis not present

## 2019-10-29 DIAGNOSIS — I5022 Chronic systolic (congestive) heart failure: Secondary | ICD-10-CM | POA: Diagnosis not present

## 2019-11-01 DIAGNOSIS — I252 Old myocardial infarction: Secondary | ICD-10-CM | POA: Diagnosis not present

## 2019-11-01 DIAGNOSIS — I5022 Chronic systolic (congestive) heart failure: Secondary | ICD-10-CM | POA: Diagnosis not present

## 2019-11-01 DIAGNOSIS — I130 Hypertensive heart and chronic kidney disease with heart failure and stage 1 through stage 4 chronic kidney disease, or unspecified chronic kidney disease: Secondary | ICD-10-CM | POA: Diagnosis not present

## 2019-11-01 DIAGNOSIS — E1140 Type 2 diabetes mellitus with diabetic neuropathy, unspecified: Secondary | ICD-10-CM | POA: Diagnosis not present

## 2019-11-01 DIAGNOSIS — E1122 Type 2 diabetes mellitus with diabetic chronic kidney disease: Secondary | ICD-10-CM | POA: Diagnosis not present

## 2019-11-01 DIAGNOSIS — N184 Chronic kidney disease, stage 4 (severe): Secondary | ICD-10-CM | POA: Diagnosis not present

## 2019-11-01 DIAGNOSIS — I255 Ischemic cardiomyopathy: Secondary | ICD-10-CM | POA: Diagnosis not present

## 2019-11-01 DIAGNOSIS — I2510 Atherosclerotic heart disease of native coronary artery without angina pectoris: Secondary | ICD-10-CM | POA: Diagnosis not present

## 2019-11-01 DIAGNOSIS — I480 Paroxysmal atrial fibrillation: Secondary | ICD-10-CM | POA: Diagnosis not present

## 2019-11-02 DIAGNOSIS — M7061 Trochanteric bursitis, right hip: Secondary | ICD-10-CM | POA: Diagnosis not present

## 2019-11-02 DIAGNOSIS — M7062 Trochanteric bursitis, left hip: Secondary | ICD-10-CM | POA: Diagnosis not present

## 2019-11-05 DIAGNOSIS — I130 Hypertensive heart and chronic kidney disease with heart failure and stage 1 through stage 4 chronic kidney disease, or unspecified chronic kidney disease: Secondary | ICD-10-CM | POA: Diagnosis not present

## 2019-11-05 DIAGNOSIS — I2510 Atherosclerotic heart disease of native coronary artery without angina pectoris: Secondary | ICD-10-CM | POA: Diagnosis not present

## 2019-11-05 DIAGNOSIS — I480 Paroxysmal atrial fibrillation: Secondary | ICD-10-CM | POA: Diagnosis not present

## 2019-11-05 DIAGNOSIS — E1122 Type 2 diabetes mellitus with diabetic chronic kidney disease: Secondary | ICD-10-CM | POA: Diagnosis not present

## 2019-11-05 DIAGNOSIS — I5022 Chronic systolic (congestive) heart failure: Secondary | ICD-10-CM | POA: Diagnosis not present

## 2019-11-05 DIAGNOSIS — I255 Ischemic cardiomyopathy: Secondary | ICD-10-CM | POA: Diagnosis not present

## 2019-11-05 DIAGNOSIS — I252 Old myocardial infarction: Secondary | ICD-10-CM | POA: Diagnosis not present

## 2019-11-05 DIAGNOSIS — E1140 Type 2 diabetes mellitus with diabetic neuropathy, unspecified: Secondary | ICD-10-CM | POA: Diagnosis not present

## 2019-11-05 DIAGNOSIS — N184 Chronic kidney disease, stage 4 (severe): Secondary | ICD-10-CM | POA: Diagnosis not present

## 2019-11-07 DIAGNOSIS — E1140 Type 2 diabetes mellitus with diabetic neuropathy, unspecified: Secondary | ICD-10-CM | POA: Diagnosis not present

## 2019-11-07 DIAGNOSIS — I130 Hypertensive heart and chronic kidney disease with heart failure and stage 1 through stage 4 chronic kidney disease, or unspecified chronic kidney disease: Secondary | ICD-10-CM | POA: Diagnosis not present

## 2019-11-07 DIAGNOSIS — I480 Paroxysmal atrial fibrillation: Secondary | ICD-10-CM | POA: Diagnosis not present

## 2019-11-07 DIAGNOSIS — I255 Ischemic cardiomyopathy: Secondary | ICD-10-CM | POA: Diagnosis not present

## 2019-11-07 DIAGNOSIS — I252 Old myocardial infarction: Secondary | ICD-10-CM | POA: Diagnosis not present

## 2019-11-07 DIAGNOSIS — I2510 Atherosclerotic heart disease of native coronary artery without angina pectoris: Secondary | ICD-10-CM | POA: Diagnosis not present

## 2019-11-07 DIAGNOSIS — I5022 Chronic systolic (congestive) heart failure: Secondary | ICD-10-CM | POA: Diagnosis not present

## 2019-11-07 DIAGNOSIS — N184 Chronic kidney disease, stage 4 (severe): Secondary | ICD-10-CM | POA: Diagnosis not present

## 2019-11-07 DIAGNOSIS — E1122 Type 2 diabetes mellitus with diabetic chronic kidney disease: Secondary | ICD-10-CM | POA: Diagnosis not present

## 2019-11-12 DIAGNOSIS — I10 Essential (primary) hypertension: Secondary | ICD-10-CM | POA: Diagnosis not present

## 2019-11-12 DIAGNOSIS — E119 Type 2 diabetes mellitus without complications: Secondary | ICD-10-CM | POA: Diagnosis not present

## 2019-11-12 DIAGNOSIS — I4891 Unspecified atrial fibrillation: Secondary | ICD-10-CM | POA: Diagnosis not present

## 2019-11-12 DIAGNOSIS — G894 Chronic pain syndrome: Secondary | ICD-10-CM | POA: Diagnosis not present

## 2019-11-14 DIAGNOSIS — I480 Paroxysmal atrial fibrillation: Secondary | ICD-10-CM | POA: Diagnosis not present

## 2019-11-14 DIAGNOSIS — N184 Chronic kidney disease, stage 4 (severe): Secondary | ICD-10-CM | POA: Diagnosis not present

## 2019-11-14 DIAGNOSIS — I252 Old myocardial infarction: Secondary | ICD-10-CM | POA: Diagnosis not present

## 2019-11-14 DIAGNOSIS — I130 Hypertensive heart and chronic kidney disease with heart failure and stage 1 through stage 4 chronic kidney disease, or unspecified chronic kidney disease: Secondary | ICD-10-CM | POA: Diagnosis not present

## 2019-11-14 DIAGNOSIS — I5022 Chronic systolic (congestive) heart failure: Secondary | ICD-10-CM | POA: Diagnosis not present

## 2019-11-14 DIAGNOSIS — E1140 Type 2 diabetes mellitus with diabetic neuropathy, unspecified: Secondary | ICD-10-CM | POA: Diagnosis not present

## 2019-11-14 DIAGNOSIS — E1122 Type 2 diabetes mellitus with diabetic chronic kidney disease: Secondary | ICD-10-CM | POA: Diagnosis not present

## 2019-11-14 DIAGNOSIS — I255 Ischemic cardiomyopathy: Secondary | ICD-10-CM | POA: Diagnosis not present

## 2019-11-14 DIAGNOSIS — I2510 Atherosclerotic heart disease of native coronary artery without angina pectoris: Secondary | ICD-10-CM | POA: Diagnosis not present

## 2019-11-22 DIAGNOSIS — I2510 Atherosclerotic heart disease of native coronary artery without angina pectoris: Secondary | ICD-10-CM | POA: Diagnosis not present

## 2019-11-22 DIAGNOSIS — I130 Hypertensive heart and chronic kidney disease with heart failure and stage 1 through stage 4 chronic kidney disease, or unspecified chronic kidney disease: Secondary | ICD-10-CM | POA: Diagnosis not present

## 2019-11-22 DIAGNOSIS — E1140 Type 2 diabetes mellitus with diabetic neuropathy, unspecified: Secondary | ICD-10-CM | POA: Diagnosis not present

## 2019-11-22 DIAGNOSIS — N184 Chronic kidney disease, stage 4 (severe): Secondary | ICD-10-CM | POA: Diagnosis not present

## 2019-11-22 DIAGNOSIS — I255 Ischemic cardiomyopathy: Secondary | ICD-10-CM | POA: Diagnosis not present

## 2019-11-22 DIAGNOSIS — E1122 Type 2 diabetes mellitus with diabetic chronic kidney disease: Secondary | ICD-10-CM | POA: Diagnosis not present

## 2019-11-22 DIAGNOSIS — I480 Paroxysmal atrial fibrillation: Secondary | ICD-10-CM | POA: Diagnosis not present

## 2019-11-22 DIAGNOSIS — I5022 Chronic systolic (congestive) heart failure: Secondary | ICD-10-CM | POA: Diagnosis not present

## 2019-11-22 DIAGNOSIS — I252 Old myocardial infarction: Secondary | ICD-10-CM | POA: Diagnosis not present

## 2019-11-23 ENCOUNTER — Ambulatory Visit: Payer: Medicare HMO | Admitting: Cardiology

## 2019-11-28 ENCOUNTER — Ambulatory Visit: Payer: Medicare HMO | Admitting: Student

## 2019-11-30 DIAGNOSIS — E1140 Type 2 diabetes mellitus with diabetic neuropathy, unspecified: Secondary | ICD-10-CM | POA: Diagnosis not present

## 2019-11-30 DIAGNOSIS — I130 Hypertensive heart and chronic kidney disease with heart failure and stage 1 through stage 4 chronic kidney disease, or unspecified chronic kidney disease: Secondary | ICD-10-CM | POA: Diagnosis not present

## 2019-11-30 DIAGNOSIS — I480 Paroxysmal atrial fibrillation: Secondary | ICD-10-CM | POA: Diagnosis not present

## 2019-11-30 DIAGNOSIS — E1122 Type 2 diabetes mellitus with diabetic chronic kidney disease: Secondary | ICD-10-CM | POA: Diagnosis not present

## 2019-11-30 DIAGNOSIS — N184 Chronic kidney disease, stage 4 (severe): Secondary | ICD-10-CM | POA: Diagnosis not present

## 2019-11-30 DIAGNOSIS — I5022 Chronic systolic (congestive) heart failure: Secondary | ICD-10-CM | POA: Diagnosis not present

## 2019-11-30 DIAGNOSIS — I2510 Atherosclerotic heart disease of native coronary artery without angina pectoris: Secondary | ICD-10-CM | POA: Diagnosis not present

## 2019-11-30 DIAGNOSIS — I255 Ischemic cardiomyopathy: Secondary | ICD-10-CM | POA: Diagnosis not present

## 2019-11-30 DIAGNOSIS — I252 Old myocardial infarction: Secondary | ICD-10-CM | POA: Diagnosis not present

## 2019-12-13 ENCOUNTER — Ambulatory Visit (INDEPENDENT_AMBULATORY_CARE_PROVIDER_SITE_OTHER): Payer: Medicare HMO | Admitting: Student

## 2019-12-13 ENCOUNTER — Other Ambulatory Visit: Payer: Self-pay

## 2019-12-13 ENCOUNTER — Encounter: Payer: Self-pay | Admitting: Student

## 2019-12-13 VITALS — BP 138/72 | HR 64 | Ht 67.0 in | Wt 129.8 lb

## 2019-12-13 DIAGNOSIS — I5042 Chronic combined systolic (congestive) and diastolic (congestive) heart failure: Secondary | ICD-10-CM | POA: Diagnosis not present

## 2019-12-13 DIAGNOSIS — I2510 Atherosclerotic heart disease of native coronary artery without angina pectoris: Secondary | ICD-10-CM | POA: Diagnosis not present

## 2019-12-13 DIAGNOSIS — N1832 Chronic kidney disease, stage 3b: Secondary | ICD-10-CM

## 2019-12-13 DIAGNOSIS — E785 Hyperlipidemia, unspecified: Secondary | ICD-10-CM | POA: Diagnosis not present

## 2019-12-13 DIAGNOSIS — Z79899 Other long term (current) drug therapy: Secondary | ICD-10-CM | POA: Diagnosis not present

## 2019-12-13 DIAGNOSIS — I480 Paroxysmal atrial fibrillation: Secondary | ICD-10-CM

## 2019-12-13 DIAGNOSIS — I255 Ischemic cardiomyopathy: Secondary | ICD-10-CM | POA: Diagnosis not present

## 2019-12-13 DIAGNOSIS — I10 Essential (primary) hypertension: Secondary | ICD-10-CM

## 2019-12-13 MED ORDER — APIXABAN 2.5 MG PO TABS: 2.5000 mg | ORAL_TABLET | Freq: Two times a day (BID) | ORAL | 3 refills | Status: DC

## 2019-12-13 MED ORDER — ENTRESTO 24-26 MG PO TABS: 1.0000 | ORAL_TABLET | Freq: Two times a day (BID) | ORAL | 3 refills | Status: DC

## 2019-12-13 NOTE — Patient Instructions (Signed)
Medication Instructions:  Your physician recommends that you continue on your current medications as directed. Please refer to the Current Medication list given to you today.  *If you need a refill on your cardiac medications before your next appointment, please call your pharmacy*   Lab Work: Your physician recommends that you return for lab work in: BMET   If you have labs (blood work) drawn today and your tests are completely normal, you will receive your results only by: Marland Kitchen MyChart Message (if you have MyChart) OR . A paper copy in the mail If you have any lab test that is abnormal or we need to change your treatment, we will call you to review the results.   Testing/Procedures: NONE    Follow-Up: At [facility], you and your health needs are our priority.  As part of our continuing mission to provide you with exceptional heart care, we have created designated Provider Care Teams.  These Care Teams include your primary Cardiologist (physician) and Advanced Practice Providers (APPs -  Physician Assistants and Nurse Practitioners) who all work together to provide you with the care you need, when you need it.  We recommend signing up for the patient portal called "MyChart".  Sign up information is provided on this After Visit Summary.  MyChart is used to connect with patients for Virtual Visits (Telemedicine).  Patients are able to view lab/test results, encounter notes, upcoming appointments, etc.  Non-urgent messages can be sent to your provider as well.   To learn more about what you can do with MyChart, go to [email or web address].    Your next appointment:   6 month(s)  The format for your next appointment:   In Person  Provider:   Rozann Lesches, MD   Other Instructions Thank you for choosing [facility]!

## 2019-12-13 NOTE — Progress Notes (Signed)
Cardiology Office Note    Date:  12/13/2019   ID:  Debra [name], DOB 10-21-1928, MRN [id number]  PCP:  Sharilyn Sites, MD  Cardiologist: Rozann Lesches, MD    Chief Complaint  Patient presents with   Follow-up    6 week visit    History of Present Illness:    Debra [name] is a 86 y.o. female with past medical history of CAD (s/p CABG in 1997, cath in 12/2017 showing patent LIMA-LAD, occluded SVG-OM and no graft visualized to RCA but filled by collaterals, NSTEMI in 07/2019 with cath showing patent LIMA-LAD, known occlusion of SVG-OM, patent LCx stent and CTO of RCA with collaterals noted and no significant change with continued medical therapy recommended), chronic combined systolic and diastolic CHF/ICM (EF 69-48% by echo in 07/2019), paroxysmal atrial fibrillation, mild to moderate MR,  HTN, HLD, Type 2 DM, COPD and Luetscher Syndrome who presents to the office today for 6-week follow-up.   She was last examined by Dr. Domenic Polite in 10/2019 and reported having baseline dyspnea on exertion and fatigue but weight had overall been stable on her home scales. Heart rate was staying around 60 despite increased activity and it was recommended to fully discontinue Coreg. She was continued on low-dose Amiodarone 100 mg daily and Entresto was restarted with plans for follow-up labs.  In talking with the patient and her daughter today, she reports that her dizziness has improved since Carvedilol was discontinued. They have not checked her blood pressure routinely at home and this was recorded as being elevated at 180/70 when initially checked but she had just walked back from the restroom. This was rechecked later in the visit and improved to 138/72. She has baseline dyspnea on exertion but denies any specific orthopnea, PND, lower extremity edema or chest pain. She has been experiencing significant hip pain and is being followed by Orthopedics. She favors conservative therapy and is not interested in  hip replacement surgery at this time.  Past Medical History:  Diagnosis Date   Arthritis    CKD (chronic kidney disease) stage 4, GFR 15-29 ml/min (HCC)    Colon cancer (HCC)    COPD (chronic obstructive pulmonary disease) ([place])    Coronary artery disease    a. s/p CABG in 1997 (no detials provided) b. 12/2017: NSTEMI with cath showing patent LIMA-LAD with occluded SVG-OM and presumed occluded SVG-RCA but unable to be visualized, RCA was filled by collaterals. Medical management recommended. c.  07/2019: NSTEMI with rapid atrial fibrillation, stable coronary and bypass graft anatomy by cardiac catheterization   DDD (degenerative disc disease), lumbar    Diabetic neuropathy (HCC)    Essential hypertension    GERD (gastroesophageal reflux disease)    Gout    Hypothyroidism    Ischemic cardiomyopathy    Restless leg syndrome    Type 2 diabetes mellitus ([place])     Past Surgical History:  Procedure Laterality Date   ABDOMINAL HYSTERECTOMY     CHOLECYSTECTOMY     COLON RESECTION     CORONARY ARTERY BYPASS GRAFT     ILIAC ARTERY STENT     LEFT HEART CATH AND CORS/GRAFTS ANGIOGRAPHY N/A 12/28/2017   Procedure: LEFT HEART CATH AND CORS/GRAFTS ANGIOGRAPHY;  Surgeon: Martinique, Peter M, MD;  Location: [facility];  Service: Cardiovascular;  Laterality: N/A;   LEFT HEART CATH AND CORS/GRAFTS ANGIOGRAPHY N/A 08/01/2019   Procedure: LEFT HEART CATH AND CORS/GRAFTS ANGIOGRAPHY;  Surgeon: Burnell Blanks, MD;  Location: [facility]  INVASIVE CV LAB;  Service: Cardiovascular;  Laterality: N/A;   LUNG SURGERY Left 2012   left lung resection   RENAL ARTERY STENT     THYROID SURGERY     ULTRASOUND GUIDANCE FOR VASCULAR ACCESS  12/28/2017   Procedure: Ultrasound Guidance For Vascular Access;  Surgeon: Martinique, Peter M, MD;  Location: [facility];  Service: Cardiovascular;;    Current Medications: Outpatient Medications Prior to Visit  Medication Sig Dispense Refill    acetaminophen (TYLENOL) 325 MG tablet Take 650 mg by mouth every 6 (six) hours as needed for mild pain.     albuterol (VENTOLIN HFA) 108 (90 Base) MCG/ACT inhaler Inhale 1 puff into the lungs every 6 (six) hours as needed for shortness of breath or wheezing.     amiodarone (PACERONE) 100 MG tablet Take 1 tablet (100 mg total) by mouth daily. (Patient taking differently: Take 50 mg by mouth 2 (two) times daily. ) 90 tablet 3   atorvastatin (LIPITOR) 80 MG tablet Take 1 tablet (80 mg total) by mouth daily at 6 PM. 30 tablet 6   Cholecalciferol (VITAMIN D) 125 MCG (5000 UT) CAPS Take 1 capsule by mouth daily.     diazepam (VALIUM) 2 MG tablet Take 2 mg by mouth daily as needed.     gabapentin (NEURONTIN) 100 MG capsule Take 100 mg by mouth 3 (three) times daily.      HYDROcodone-acetaminophen (NORCO) 10-325 MG tablet Take 1 tablet by mouth every 6 (six) hours as needed for moderate pain or severe pain. 1 tab po hs/ and prn.     insulin glargine (LANTUS) 100 unit/mL SOPN Inject 0.1 mLs (10 Units total) into the skin at bedtime.     levothyroxine (SYNTHROID) 50 MCG tablet Take 1 tablet (50 mcg total) by mouth daily at 6 (six) AM. 30 tablet 2   Lidocaine HCl-Benzyl Alcohol (SALONPAS LIDOCAINE PLUS) 4-10 % LIQD Apply 1 application topically as needed.     Menthol, Topical Analgesic, (ASPERCREME MAX ROLL-ON) 16 % LIQD Apply 1 application topically as needed.     pantoprazole (PROTONIX) 40 MG tablet Take 40 mg by mouth daily.      polyethylene glycol (MIRALAX / GLYCOLAX) 17 g packet Take 17 g by mouth daily as needed for mild constipation. 14 each 0   rOPINIRole (REQUIP) 2 MG tablet Take 2 mg by mouth at bedtime.      apixaban (ELIQUIS) 2.5 MG TABS tablet Take 1 tablet (2.5 mg total) by mouth 2 (two) times daily. 180 tablet 1   sacubitril-valsartan (ENTRESTO) 24-26 MG Take 1 tablet by mouth 2 (two) times daily. 60 tablet 6   saccharomyces boulardii (FLORASTOR) 250 MG capsule Take 1  capsule (250 mg total) by mouth 2 (two) times daily. (Patient not taking: Reported on 12/13/2019)     No facility-administered medications prior to visit.     Allergies:   Ciprofloxacin, Iodine, and Iodinated diagnostic agents   Social History   Socioeconomic History   Marital status: Widowed    Spouse name: Not on file   Number of children: Not on file   Years of education: Not on file   Highest education level: Not on file  Occupational History   Not on file  Tobacco Use   Smoking status: Former Smoker    Types: Cigarettes    Quit date: 03/21/1996    Years since quitting: 23.7   Smokeless tobacco: Never Used  Vaping Use   Vaping Use: Never used  Substance and Sexual Activity   Alcohol use: No   Drug use: No   Sexual activity: Not on file  Other Topics Concern   Not on file  Social History Narrative   Not on file   Social Determinants of Health   Financial Resource Strain:    Difficulty of Paying Living Expenses:   Food Insecurity:    Worried About Running Out of Food in the Last Year:    Arboriculturist in the Last Year:   Transportation Needs:    Film/video editor (Medical):    Lack of Transportation (Non-Medical):   Physical Activity:    Days of Exercise per Week:    Minutes of Exercise per Session:   Stress:    Feeling of Stress :   Social Connections:    Frequency of Communication with Friends and Family:    Frequency of Social Gatherings with Friends and Family:    Attends Religious Services:    Active Member of Clubs or Organizations:    Attends Archivist Meetings:    Marital Status:      Family History:  The patient's family history includes Breast cancer in her daughter; Colon cancer in her mother; Heart attack in her sister; Heart disease in her father.   Review of Systems:   Please see the history of present illness.     General:  No chills, fever, night sweats or weight changes. Positive for bilateral  hip pain.  Cardiovascular:  No chest pain, edema, orthopnea, palpitations, paroxysmal nocturnal dyspnea. Positive for dyspnea on exertion.  Dermatological: No rash, lesions/masses Respiratory: No cough, dyspnea Urologic: No hematuria, dysuria Abdominal:   No nausea, vomiting, diarrhea, bright red blood per rectum, melena, or hematemesis Neurologic:  No visual changes, wkns, changes in mental status. All other systems reviewed and are otherwise negative except as noted above.   Physical Exam:    VS:  BP 138/72    Pulse 64    Ht 5\' 7"  (1.702 m)    Wt 129 lb 12.8 oz (58.9 kg)    SpO2 96%    BMI 20.33 kg/m    General: Well developed, elderly female appearing in no acute distress. Head: Normocephalic, atraumatic, sclera non-icteric.  Neck: No carotid bruits. JVD not elevated.  Lungs: Respirations regular and unlabored, without wheezes or rales.  Heart: Regular rate and rhythm. No S3 or S4.  No murmur, no rubs, or gallops appreciated. Abdomen: Soft, non-tender, non-distended. No obvious abdominal masses. Msk:  Strength and tone appear normal for age. No obvious joint deformities or effusions. Extremities: No clubbing or cyanosis. No lower extremity edema.  Distal pedal pulses are 2+ bilaterally. Neuro: Alert and oriented X 3. Moves all extremities spontaneously. No focal deficits noted. Psych:  Responds to questions appropriately with a normal affect. Skin: No rashes or lesions noted  Wt Readings from Last 3 Encounters:  12/13/19 129 lb 12.8 oz (58.9 kg)  10/12/19 123 lb (55.8 kg)  10/03/19 125 lb 6.4 oz (56.9 kg)    Studies/Labs Reviewed:   EKG:  EKG is not ordered today.    Recent Labs: 07/30/2019: ALT 13; TSH 0.019 08/01/2019: Hemoglobin 11.0; Magnesium 2.0; Platelets 207 08/02/2019: BUN 36; Creatinine, Ser 1.50; Potassium 4.2; Sodium 137   Lipid Panel    Component Value Date/Time   CHOL 89 12/28/2017 0334   TRIG 73 12/28/2017 0334   HDL 36 (L) 12/28/2017 0334   CHOLHDL 2.5  12/28/2017 0334   VLDL 15 12/28/2017  [place] 38 12/28/2017 0334    Additional studies/ records that were reviewed today include:   Echocardiogram: 07/2019 IMPRESSIONS    1. Left ventricular ejection fraction, by visual estimation, is 35 to  40%. The left ventricle has moderately decreased function. There is no  left ventricular hypertrophy.  2. Mild dyskinesis of the left ventricular, basal-mid inferoseptal wall  and inferior wall (consistent with previous infarction in the right  coronary artery distribution).  3. Severe hypokinesis of the left ventricular, basal anteroseptal wall.  The apical segments contract normally (consistent with occlusion of the  proximal LAD artery with patent graft to the distal vessel).  4. Abnormal septal motion consistent with post-operative status.  5. Elevated left atrial pressure.  6. Left ventricular diastolic parameters are consistent with Grade II  diastolic dysfunction (pseudonormalization).  7. The left ventricle demonstrates regional wall motion abnormalities.  8. Global right ventricle has normal systolic function.The right  ventricular size is normal. No increase in right ventricular wall  thickness.  9. Left atrial size was mildly dilated.  10. Right atrial size was normal.  11. Mild mitral annular calcification.  12. The mitral valve is normal in structure. Mild to moderate mitral valve  regurgitation.  13. The tricuspid valve is normal in structure.  14. The tricuspid valve is normal in structure. Tricuspid valve  regurgitation is not demonstrated.  15. The aortic valve is normal in structure. Aortic valve regurgitation is  not visualized. Mild aortic valve sclerosis without stenosis.  16. The pulmonic valve was grossly normal. Pulmonic valve regurgitation is  trivial.  17. TR signal is inadequate for assessing pulmonary artery systolic  pressure.  18. The inferior vena cava is dilated in size with <50% respiratory   variability, suggesting right atrial pressure of 15 mmHg.  19. No significant change from prior study.  20. Prior images reviewed side by side.  Cardiac Catheterization: 07/2019  Ost LAD to Prox LAD lesion is 100% stenosed.  Ost 1st Mrg to 1st Mrg lesion is 40% stenosed.  Ost 2nd Mrg lesion is 30% stenosed.  Ost 3rd Mrg lesion is 90% stenosed.  Ost RCA to Dist RCA lesion is 100% stenosed.  LIMA and is normal in caliber.  The graft exhibits no disease.  SVG.  Origin to Prox Graft lesion is 100% stenosed.  SVG due to known occlusion.  Origin to Prox Graft lesion is 100% stenosed.  Prox Cx to Mid Cx lesion is 10% stenosed.   1. Triple vessel CAD s/p CABG with 1 patent bypass graft 2. Occluded proximal LAD. Patent LIMA to mid LAD 3. Known occlusion of the SVG to OM. The mid Circumflex stent is patent. Severe disease in a small caliber OM branch that fills from left to left collaterals. Unchanged from last cath.  4. Chronic occlusion of the proximal RCA. Known occlusion of the vein graft to the distal RCA. The distal RCA fills from left to right collaterals.   Recommendations: Coronary disease unchanged from last cath in 2019. Continue medical management of CAD.     Assessment:    1. Coronary artery disease involving native coronary artery of native heart without angina pectoris   2. Chronic combined systolic and diastolic heart failure ([place])   3. Ischemic cardiomyopathy   4. Medication management   5. Paroxysmal atrial fibrillation (HCC)   6. Essential hypertension   7. Hyperlipidemia LDL goal <70   8. Stage 3b chronic kidney disease      Plan:  In order of problems listed above:  1. CAD - She is s/p CABG in 1997 with cath in 12/2017 showing patent LIMA-LAD, occluded SVG-OM and no graft visualized to RCA but filled by collaterals and NSTEMI in 07/2019 with cath showing patent LIMA-LAD, known occlusion of SVG-OM, patent LCx stent and CTO of RCA with collaterals  noted and no significant change with continued medical therapy recommended. - Her breathing has been at baseline and she denies any recent chest pain. - Continue current medical therapy with Atorvastatin 80 mg daily. She is not on ASA given the need for anticoagulation and beta-blocker therapy was recently discontinued given her bradycardia.  2. Chronic Combined Systolic and Diastolic CHF/ICM - She has a known reduced EF of 35-40% by echo in 07/2019. She denies any orthopnea, PND or lower extremity edema. Appears euvolemic by examination today. Continue Entresto 24-26mg  BID. Will recheck a BMET as renal function has not been rechecked since initiation of this. No longer on BB therapy given bradycardia. Could consider Spironolactone in the future but would be cautious given her Stage 3 CKD and Luetscher Syndrome.  3. Paroxysmal Atrial Fibrillation - She is maintaining normal sinus rhythm by examination today. Remains on Amiodarone 100 mg daily. TSH and LFT's were stable when checked by her PCP in 08/2019.  - She denies any evidence of active bleeding. She does experience easy bruising. Remains on Eliquis 2.5 mg twice daily. On the reduced dose given her age and weight.   4. HTN - BP is well controlled at 138/72 during today's visit. Continue current medication regimen.  5. HLD - LDL was at 54 on most recent check. Continue Atorvastatin 80mg  daily.   6. Stage 3 CKD - baseline creatinine 1.2 - 1.3. Will plan for a repeat BMET following recent re-initiation on Entresto.     Medication Adjustments/Labs and Tests Ordered: Current medicines are reviewed at length with the patient today.  Concerns regarding medicines are outlined above.  Medication changes, Labs and Tests ordered today are listed in the Patient Instructions below. Patient Instructions  Medication Instructions:  Your physician recommends that you continue on your current medications as directed. Please refer to the Current  Medication list given to you today.  *If you need a refill on your cardiac medications before your next appointment, please call your pharmacy*   Lab Work: Your physician recommends that you return for lab work in: BMET   If you have labs (blood work) drawn today and your tests are completely normal, you will receive your results only by:  MyChart Message (if you have MyChart) OR  A paper copy in the mail If you have any lab test that is abnormal or we need to change your treatment, we will call you to review the results.   Testing/Procedures: NONE    Follow-Up: At [facility], you and your health needs are our priority.  As part of our continuing mission to provide you with exceptional heart care, we have created designated Provider Care Teams.  These Care Teams include your primary Cardiologist (physician) and Advanced Practice Providers (APPs -  Physician Assistants and Nurse Practitioners) who all work together to provide you with the care you need, when you need it.  We recommend signing up for the patient portal called "MyChart".  Sign up information is provided on this After Visit Summary.  MyChart is used to connect with patients for Virtual Visits (Telemedicine).  Patients are able to view lab/test results, encounter notes, upcoming appointments, etc.  Non-urgent messages can be sent to your provider as well.   To learn more about what you can do with MyChart, go to [email or web address].    Your next appointment:   6 month(s)  The format for your next appointment:   In Person  Provider:   Rozann Lesches, MD   Other Instructions Thank you for choosing [facility]!     Signed, Erma Heritage, PA-C  12/13/2019 5:54 PM    [facility] [facility] [address] [place], [place] [address] Phone: [phone] Fax: [phone]

## 2019-12-18 DIAGNOSIS — T148XXA Other injury of unspecified body region, initial encounter: Secondary | ICD-10-CM | POA: Diagnosis not present

## 2019-12-24 DIAGNOSIS — Z79899 Other long term (current) drug therapy: Secondary | ICD-10-CM | POA: Diagnosis not present

## 2019-12-25 DIAGNOSIS — M48061 Spinal stenosis, lumbar region without neurogenic claudication: Secondary | ICD-10-CM | POA: Diagnosis not present

## 2019-12-25 DIAGNOSIS — M5416 Radiculopathy, lumbar region: Secondary | ICD-10-CM | POA: Diagnosis not present

## 2019-12-25 LAB — BASIC METABOLIC PANEL
BUN/Creatinine Ratio: 12 (calc) (ref 6–22)
BUN: 21 mg/dL (ref 7–25)
CO2: 26 mmol/L (ref 20–32)
Calcium: 9 mg/dL (ref 8.6–10.4)
Chloride: 107 mmol/L (ref 98–110)
Creat: 1.7 mg/dL — ABNORMAL HIGH (ref 0.60–0.88)
Glucose, Bld: 81 mg/dL (ref 65–139)
Potassium: 4.6 mmol/L (ref 3.5–5.3)
Sodium: 143 mmol/L (ref 135–146)

## 2019-12-28 DIAGNOSIS — M48061 Spinal stenosis, lumbar region without neurogenic claudication: Secondary | ICD-10-CM | POA: Diagnosis not present

## 2019-12-28 DIAGNOSIS — M5416 Radiculopathy, lumbar region: Secondary | ICD-10-CM | POA: Diagnosis not present

## 2019-12-30 NOTE — Progress Notes (Signed)
  Subjective:  Patient ID: Debra [name], female    DOB: April 30, 1929,  MRN: [id number]  Chief Complaint  Patient presents with  . Follow-up    Flexor tenotomy - L foot, 2nd and 3rd toes. Pt stated, "I think my toes are about the same. No pain. Sometimes it feels like the ball of my foot is swollen".  . Nail Problem    L foot, 4th toenail. Pt stated, "I just noticed it. I don't check my feet every day".  . Diabetes    HgbA1c in 07/2019 = 6.3. Fasting AM glucose today per pt = 97mg /dL.    86 y.o. female presents with the above complaint. History confirmed with patient.   Objective:  Physical Exam: warm, good capillary refill, no trophic changes or ulcerative lesions, normal DP and PT pulses and normal sensory exam. Nail thickening, elongation noted  Left Foot: left 2nd/3rd toes rectus.      Assessment:   1. Onychomycosis of multiple toenails with type 2 diabetes mellitus and peripheral angiopathy ([place])   2. Hammertoe, bilateral     Plan:  Patient was evaluated and treated and all questions answered.  Hammertoes left 2nd/3rd toes -rectus, no pain  Onychomycosis, DM with PAD -Nails debrided x10  No follow-ups on file.

## 2019-12-31 ENCOUNTER — Telehealth: Payer: Self-pay

## 2019-12-31 DIAGNOSIS — Z79899 Other long term (current) drug therapy: Secondary | ICD-10-CM

## 2019-12-31 NOTE — Telephone Encounter (Signed)
I spoke with daughter, [name]. She has removed all NSAIDS from her mothers home and has tylenol for use.She will keep fluid intake to 1.5-2 liters per day.   I have mailed lab slip for her to repat BMET in 2-3 weeks

## 2019-12-31 NOTE — Telephone Encounter (Signed)
-----   Message from Erma Heritage, [place] sent at 12/25/2019 10:50 AM EDT ----- Please let the patient and her daughter know her renal function has slightly worsened since earlier this year. Creatinine was 1.50 in 07/2019, at 1.70 by most recent labs. Please make sure she is not taking NSAIDS. Recommend to keep fluid intake to 1.5 - 2.0 L per day. She is already on renally-dosed Eliquis. Would recheck BMET again in 2-3 weeks to reassess renal function as we may need to end up discontinuing Entresto if this continues to worsen.

## 2020-01-01 DIAGNOSIS — J069 Acute upper respiratory infection, unspecified: Secondary | ICD-10-CM | POA: Diagnosis not present

## 2020-01-03 ENCOUNTER — Ambulatory Visit: Payer: Medicare HMO | Admitting: Podiatry

## 2020-01-07 DIAGNOSIS — J209 Acute bronchitis, unspecified: Secondary | ICD-10-CM | POA: Diagnosis not present

## 2020-01-07 DIAGNOSIS — Z20822 Contact with and (suspected) exposure to covid-19: Secondary | ICD-10-CM | POA: Diagnosis not present

## 2020-01-07 DIAGNOSIS — Z20828 Contact with and (suspected) exposure to other viral communicable diseases: Secondary | ICD-10-CM | POA: Diagnosis not present

## 2020-01-22 DIAGNOSIS — M5416 Radiculopathy, lumbar region: Secondary | ICD-10-CM | POA: Diagnosis not present

## 2020-01-22 DIAGNOSIS — M48061 Spinal stenosis, lumbar region without neurogenic claudication: Secondary | ICD-10-CM | POA: Diagnosis not present

## 2020-01-25 ENCOUNTER — Other Ambulatory Visit: Payer: Self-pay

## 2020-01-25 ENCOUNTER — Ambulatory Visit: Payer: Medicare HMO | Admitting: Podiatry

## 2020-01-25 ENCOUNTER — Other Ambulatory Visit (HOSPITAL_COMMUNITY)
Admission: RE | Admit: 2020-01-25 | Discharge: 2020-01-25 | Disposition: A | Discharge status: Home / Self Care | Payer: Medicare HMO | Source: Ambulatory Visit | Attending: Student | Admitting: Student

## 2020-01-25 DIAGNOSIS — Z79899 Other long term (current) drug therapy: Secondary | ICD-10-CM | POA: Diagnosis not present

## 2020-01-25 LAB — BASIC METABOLIC PANEL
Anion gap: 7 (ref 5–15)
BUN: 19 mg/dL (ref 8–23)
CO2: 27 mmol/L (ref 22–32)
Calcium: 9 mg/dL (ref 8.9–10.3)
Chloride: 106 mmol/L (ref 98–111)
Creatinine, Ser: 1.45 mg/dL — ABNORMAL HIGH (ref 0.44–1.00)
GFR calc Af Amer: 37 mL/min — ABNORMAL LOW (ref >60)
GFR calc non Af Amer: 32 mL/min — ABNORMAL LOW (ref >60)
Glucose, Bld: 172 mg/dL — ABNORMAL HIGH (ref 70–99)
Potassium: 4 mmol/L (ref 3.5–5.1)
Sodium: 140 mmol/L (ref 135–145)

## 2020-01-30 DIAGNOSIS — Z681 Body mass index (BMI) 19 or less, adult: Secondary | ICD-10-CM | POA: Diagnosis not present

## 2020-01-30 DIAGNOSIS — M5416 Radiculopathy, lumbar region: Secondary | ICD-10-CM | POA: Diagnosis not present

## 2020-01-30 DIAGNOSIS — M48061 Spinal stenosis, lumbar region without neurogenic claudication: Secondary | ICD-10-CM | POA: Diagnosis not present

## 2020-01-30 DIAGNOSIS — I10 Essential (primary) hypertension: Secondary | ICD-10-CM | POA: Diagnosis not present

## 2020-01-30 DIAGNOSIS — I700 Atherosclerosis of aorta: Secondary | ICD-10-CM | POA: Diagnosis not present

## 2020-01-30 DIAGNOSIS — G894 Chronic pain syndrome: Secondary | ICD-10-CM | POA: Diagnosis not present

## 2020-01-30 DIAGNOSIS — M0689 Other specified rheumatoid arthritis, multiple sites: Secondary | ICD-10-CM | POA: Diagnosis not present

## 2020-01-31 ENCOUNTER — Ambulatory Visit: Payer: Medicare HMO | Admitting: Podiatry

## 2020-01-31 ENCOUNTER — Other Ambulatory Visit: Payer: Self-pay

## 2020-01-31 DIAGNOSIS — B351 Tinea unguium: Secondary | ICD-10-CM | POA: Diagnosis not present

## 2020-01-31 DIAGNOSIS — D689 Coagulation defect, unspecified: Secondary | ICD-10-CM

## 2020-01-31 NOTE — Progress Notes (Signed)
  Subjective:  Patient ID: Debra [name], female    DOB: December 22, 1928,  MRN: [id number]  Chief Complaint  Patient presents with  . Foot Pain    pt is here for a nail trim    86 y.o. female presents with the above complaint. History confirmed with patient.   Objective:  Physical Exam: warm, good capillary refill, no trophic changes or ulcerative lesions, normal DP and PT pulses and normal sensory exam. Nail thickening, elongation noted   Assessment:   1. Onychomycosis   2. Coagulation defect [facility])     Plan:  Patient was evaluated and treated and all questions answered.  Onychomycosis, Diabetes and Coagulation Defect -Patient is diabetic with a qualifying condition for at risk foot care.  Procedure: Nail Debridement Rationale: Patient meets criteria for routine foot care due to coagulation defect Type of Debridement: manual, sharp debridement. Instrumentation: Nail nipper, rotary burr. Number of Nails: 10    No follow-ups on file.

## 2020-02-06 IMAGING — CT CT HEAD W/O CM
3 series · 15 of 45 positions shown, 18 images · non-contrast
Comparison: None.

CLINICAL DATA: Pt. was on a side-by-side leading a horse and the
horse pulled back causing injury to patient. When patient stood up,
she had syncopal episode.

EXAM:
CT HEAD WITHOUT CONTRAST
TECHNIQUE: Contiguous axial images were obtained from the base of the skull
through the vertex without intravenous contrast.

[Series 2: head w o · axial · 0.44mm/px · z∈[+1530,+1645]mm · 9 of 28 slices shown, 12 images]
[im 3/28  brain]
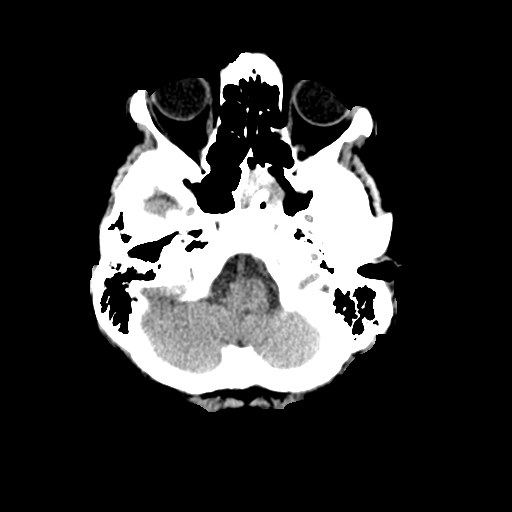
[im 3/28  bone]
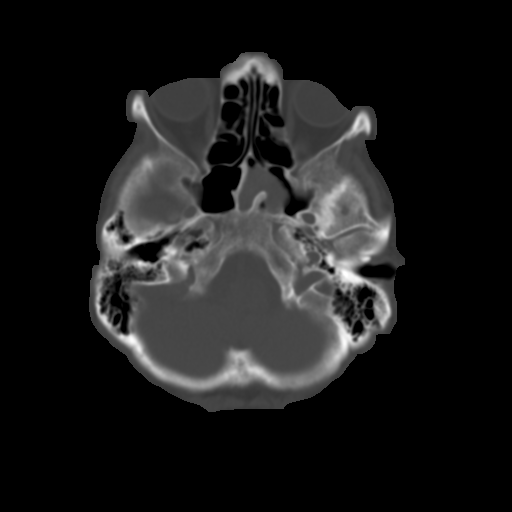
[im 6/28  brain]
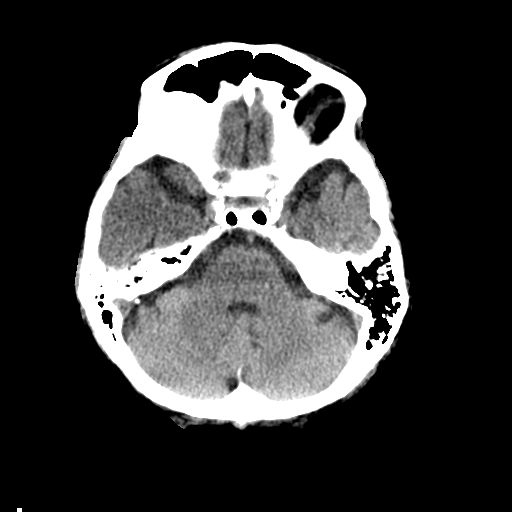
[im 9/28  brain]
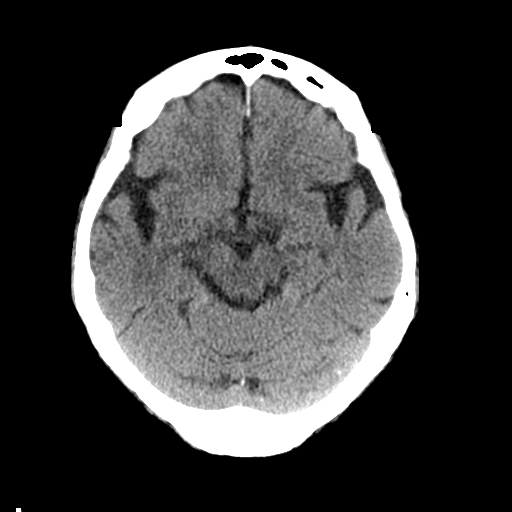
[im 12/28  brain]
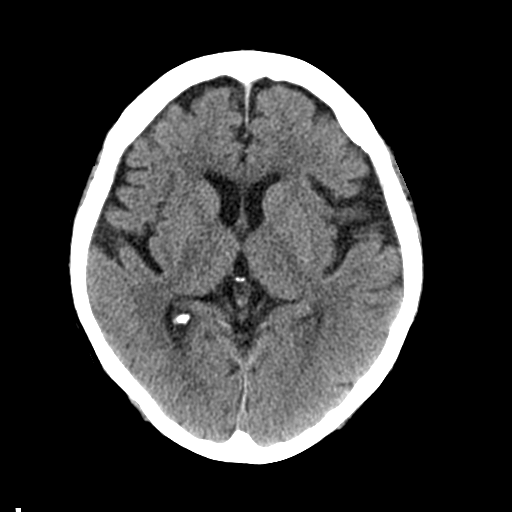
[im 15/28  brain]
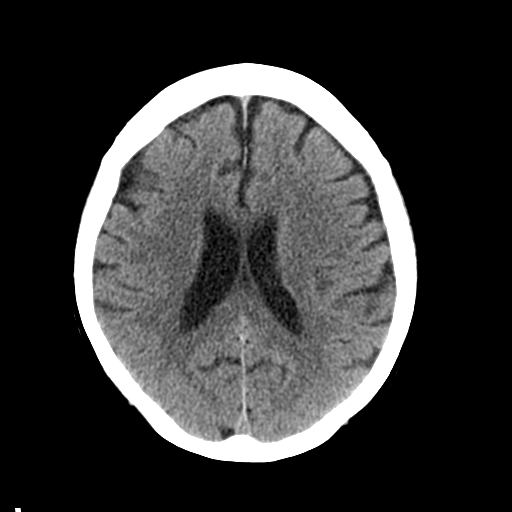
[im 15/28  bone]
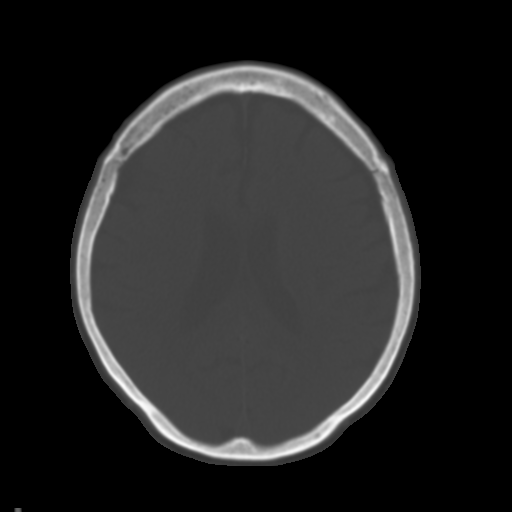
[im 17/28  brain]
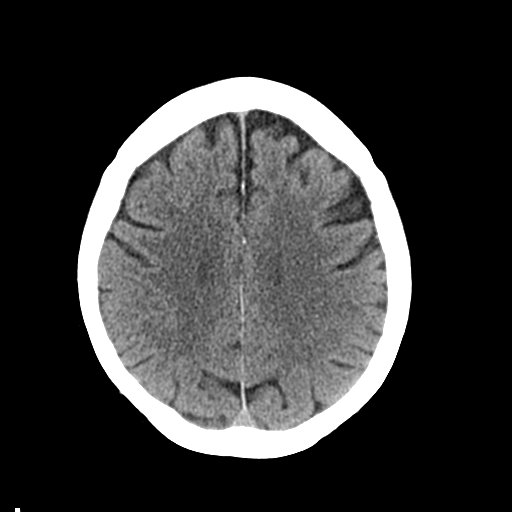
[im 20/28  brain]
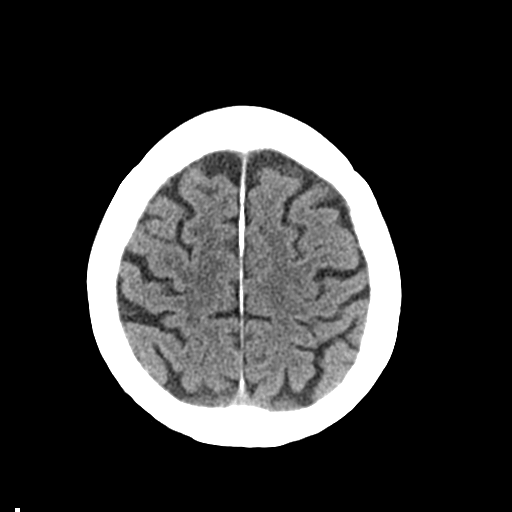
[im 23/28  brain]
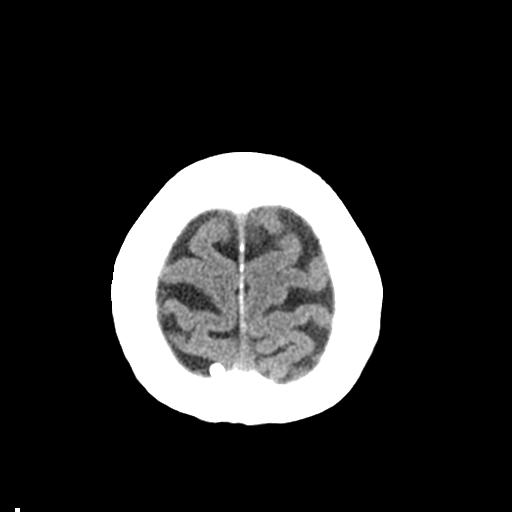
[im 26/28  brain]
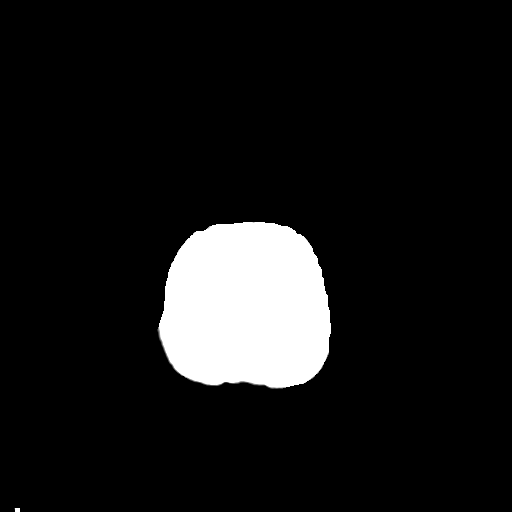
[im 26/28  bone]
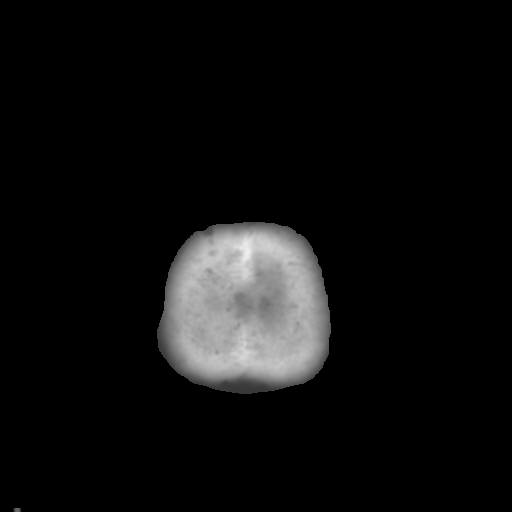

[Series 4: coronal soft · coronal · 0.34mm/px · 3 of 60 slices shown]
[im 20/60  brain]
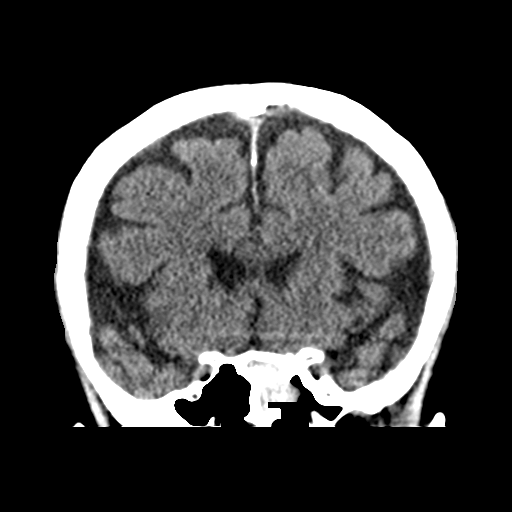
[im 27/60  brain]
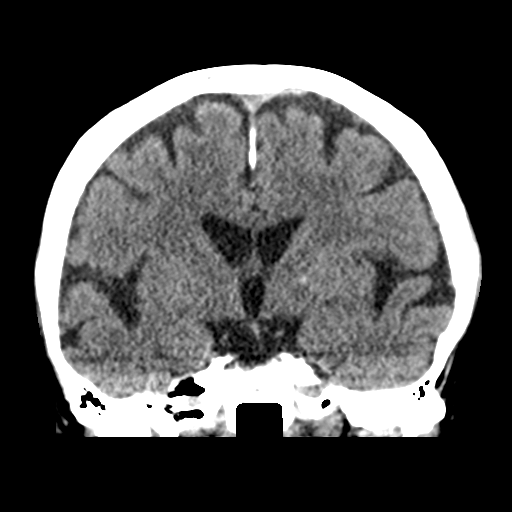
[im 33/60  brain]
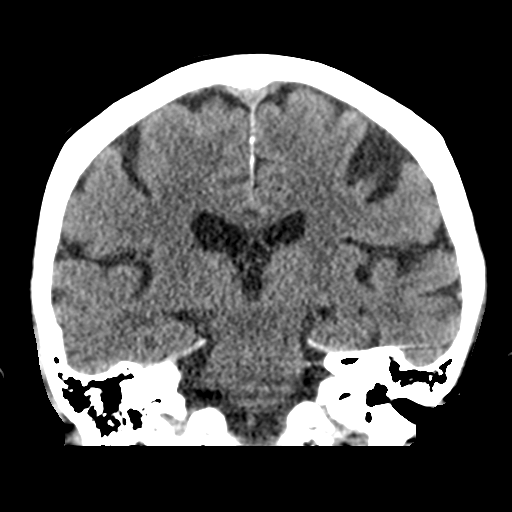

[Series 5: sagittal soft · sagittal · 0.28mm/px · 3 of 54 slices shown]
[im 18/54  brain]
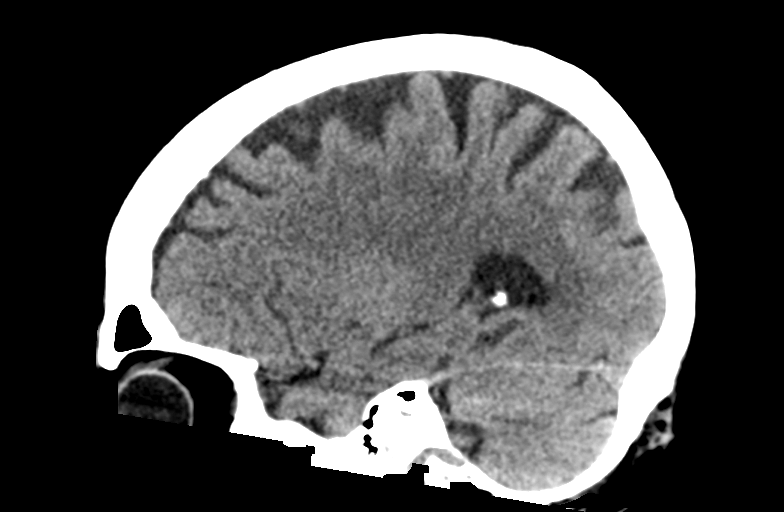
[im 27/54  brain]
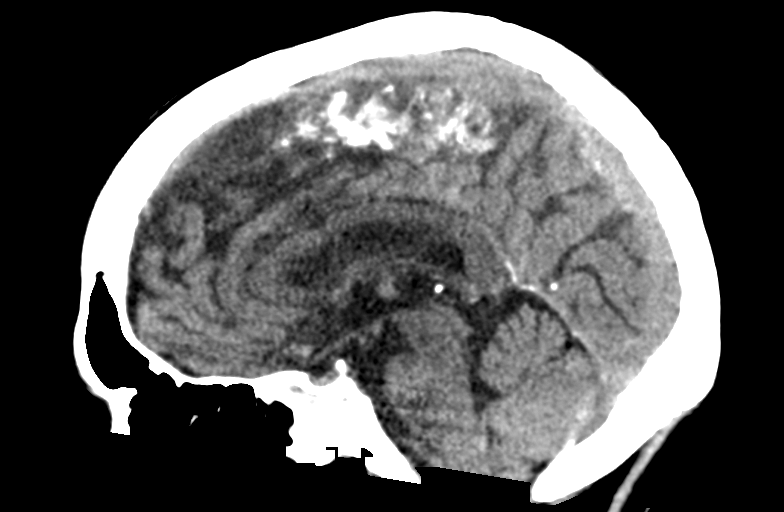
[im 36/54  brain]
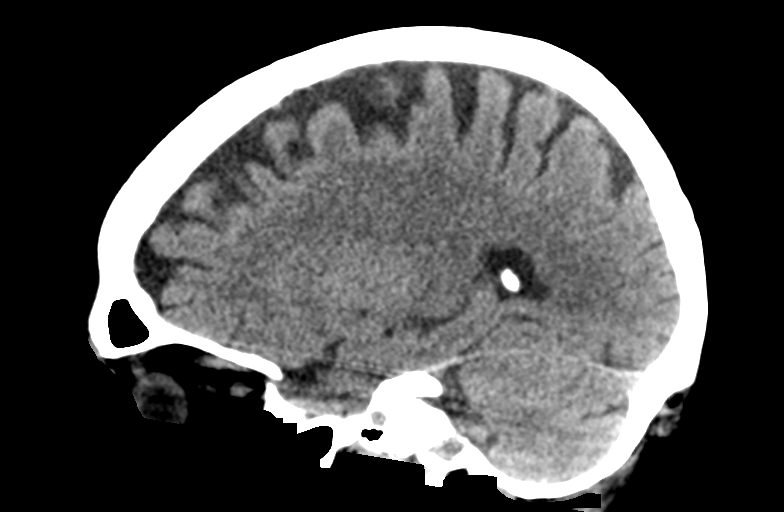

[15 of 45 positions shown; findings below may reference images not displayed]

FINDINGS: Brain: Mild central and cortical atrophy. There is no intra or
extra-axial fluid collection. Small extra-axial calcified. 5
millimeter mass is identified at the RIGHT posterior parietal
region, consistent with small meningioma. There is no associated
mass effect. The basilar cisterns and ventricles have a normal
appearance. There is no CT evidence for acute infarction or
hemorrhage.

Vascular: There is dense atherosclerotic calcification of the
carotid arteries. No hyperdense vessels.

Skull: Normal. Negative for fracture or focal lesion.

Sinuses/Orbits: Opacification of the LEFT sphenoid air cell and
scattered ethmoid air cells. Orbits are intact.

Other: None
IMPRESSION: 1. No evidence for acute intracranial abnormality.
2. Mild atrophy.
3. Small RIGHT posterior parietal meningioma without mass effect or
edema.
4. Sinus disease.

## 2020-02-06 IMAGING — DX DG CHEST 2V
2 series · 2 of 2 positions shown · non-contrast
Comparison: 08/03/2018

CLINICAL DATA: Weakness

EXAM:
CHEST - 2 VIEW

[chest lat]
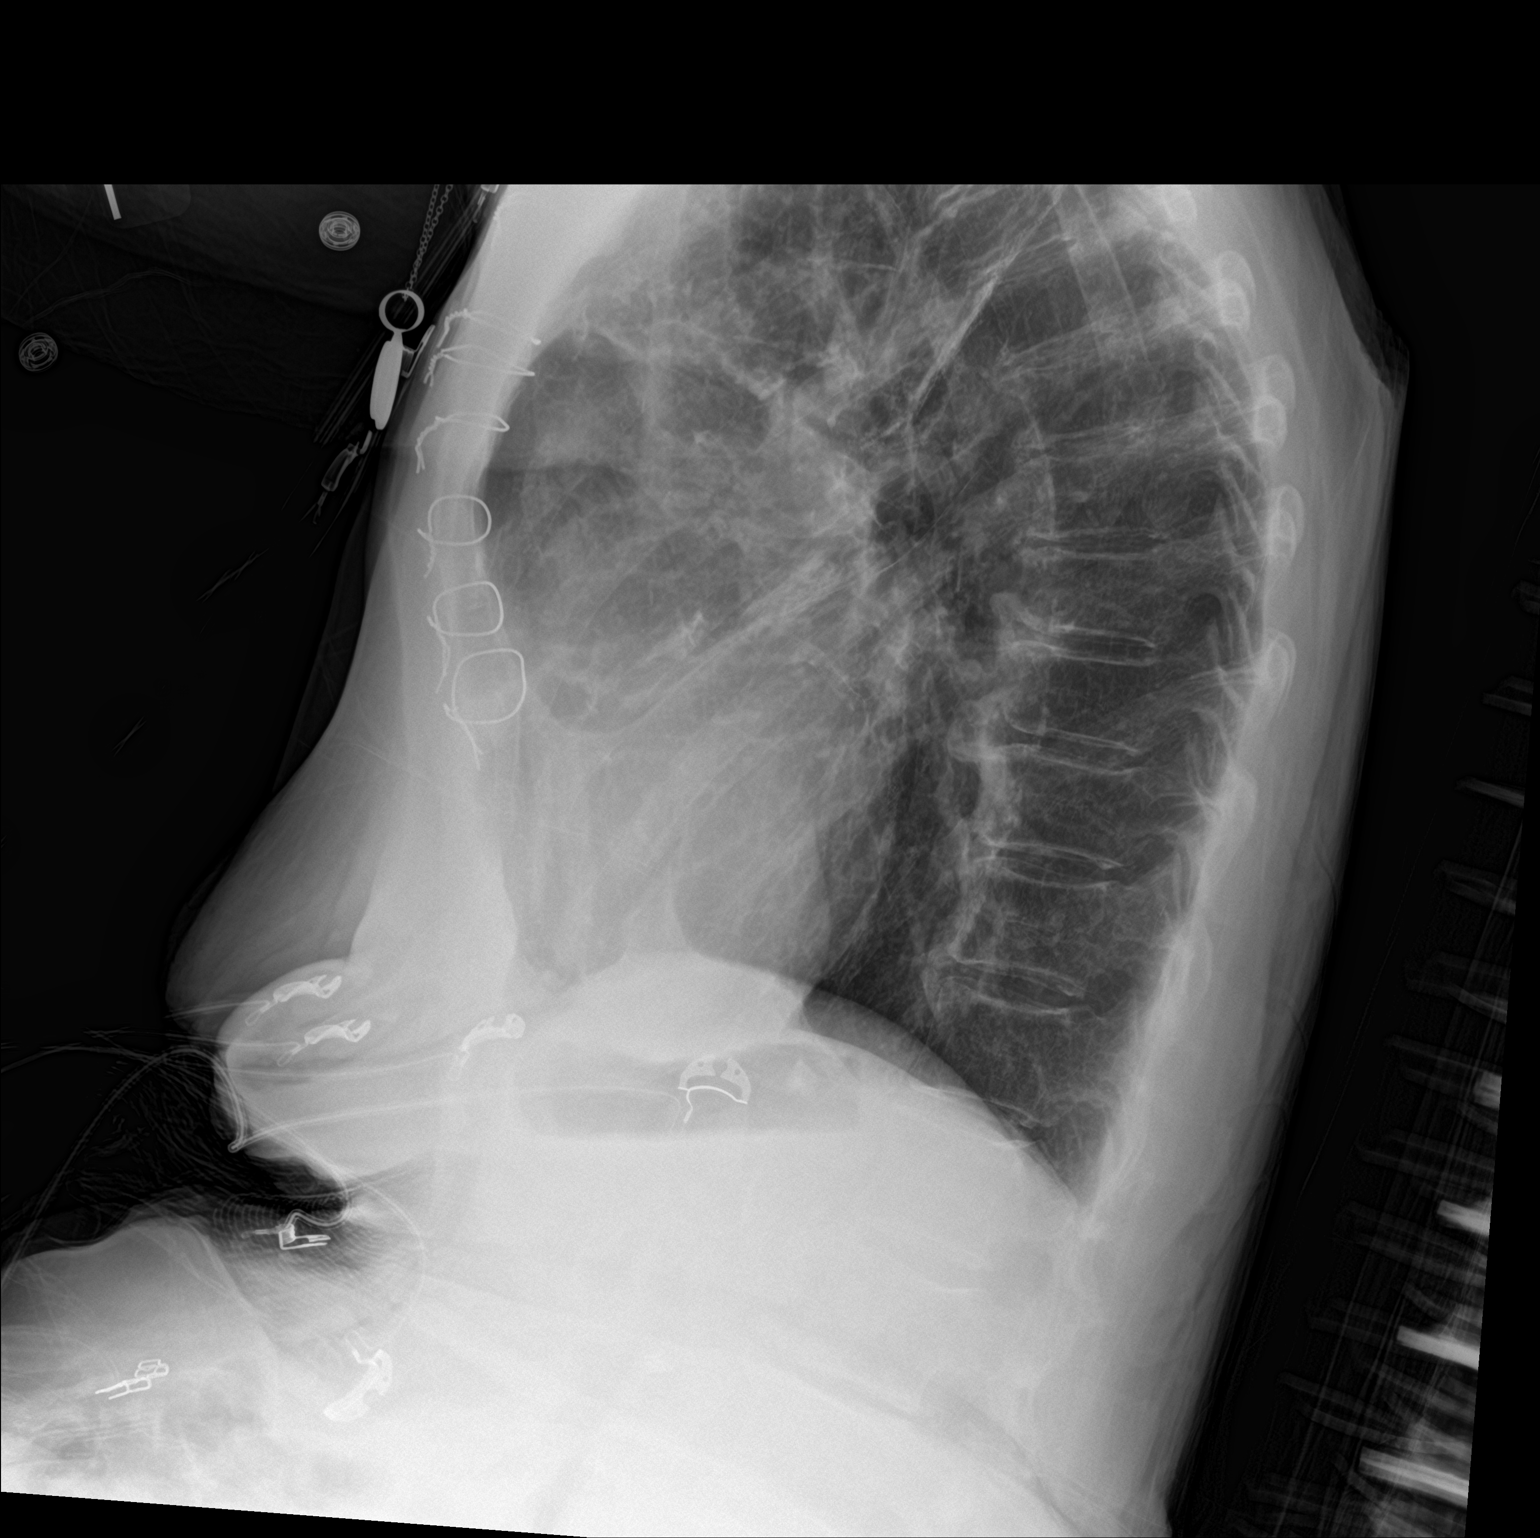

[chest ap]
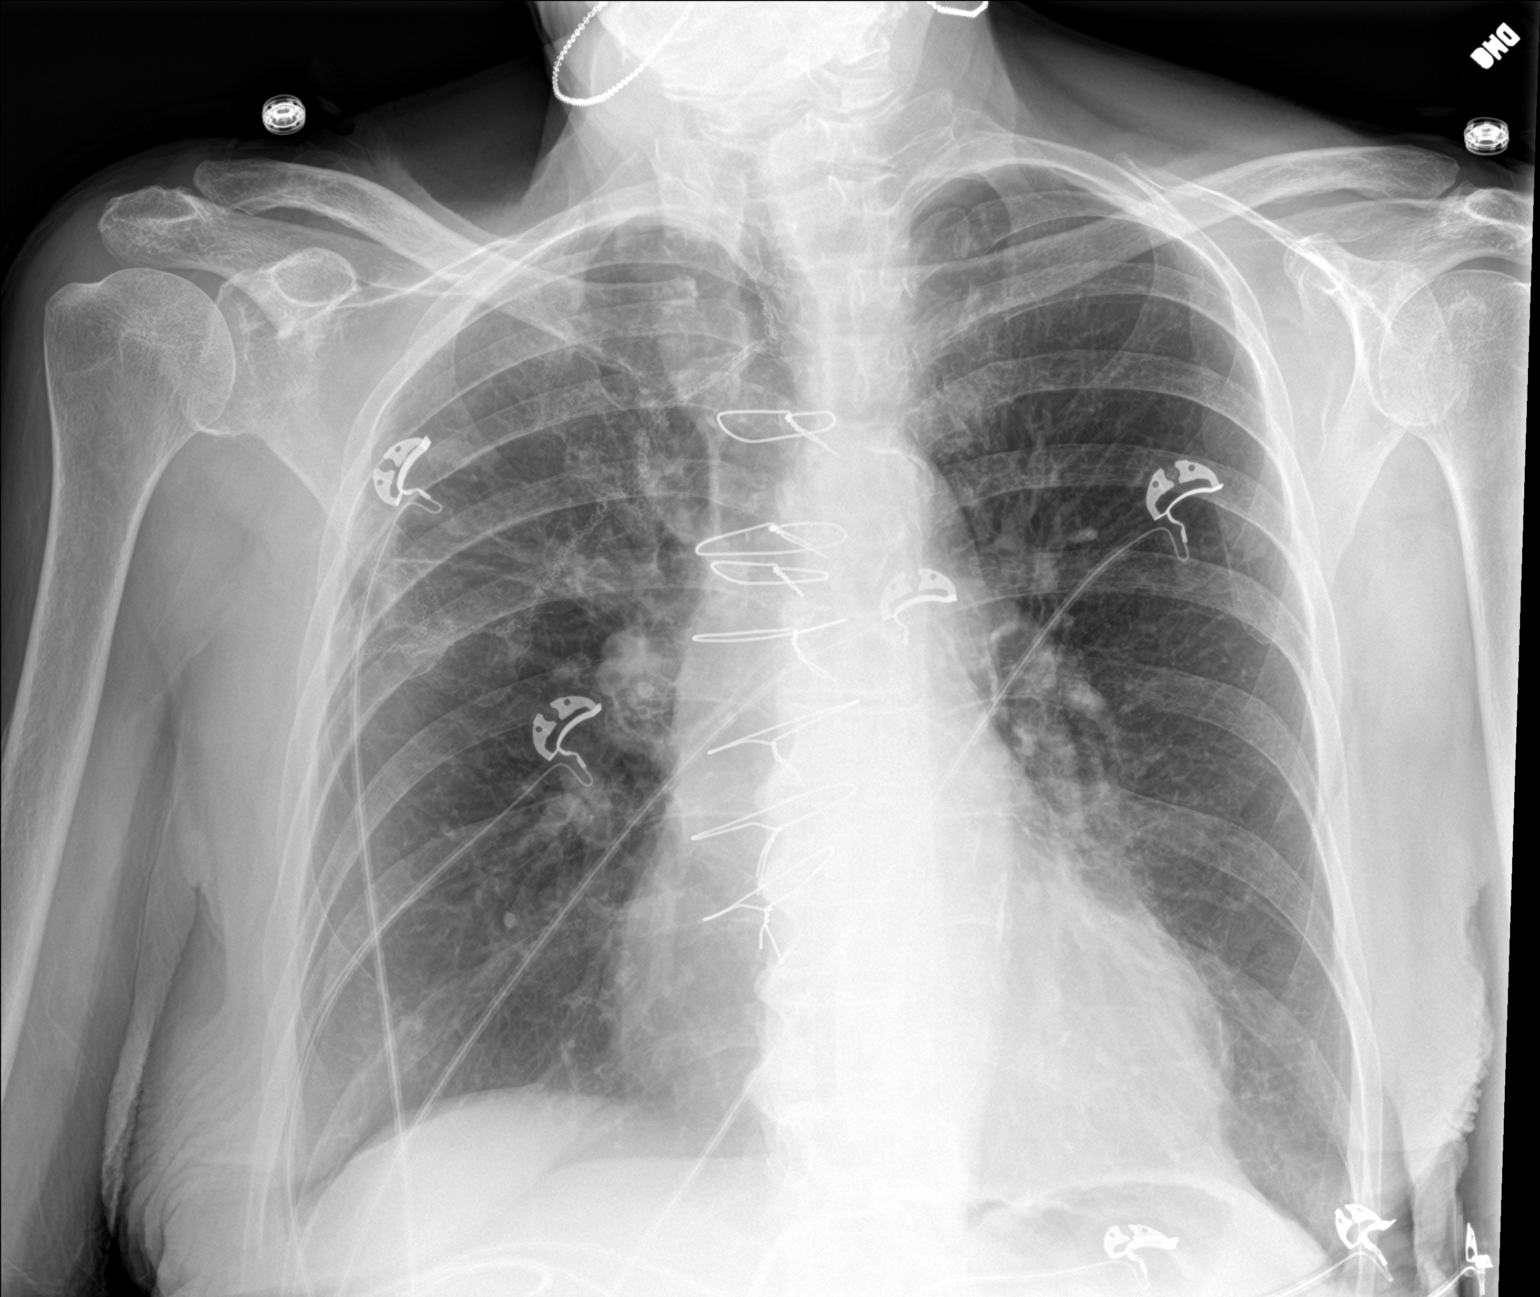

[2 of 2 positions shown; findings below may reference images not displayed]

FINDINGS: Cardiac shadow is at the upper limits of normal in size.
Postsurgical changes are again seen and stable. Extensive right lung
surgical changes are seen as well. No focal infiltrate is noted.
Mild scarring in the right upper lobe is noted related to the prior
surgery. No sizable effusion is seen. No acute bony abnormality is
noted.
IMPRESSION: Postsurgical changes with right upper lobe scarring. No acute
abnormality seen.

## 2020-02-24 DIAGNOSIS — I252 Old myocardial infarction: Secondary | ICD-10-CM | POA: Diagnosis not present

## 2020-02-24 DIAGNOSIS — I4891 Unspecified atrial fibrillation: Secondary | ICD-10-CM | POA: Diagnosis not present

## 2020-02-24 DIAGNOSIS — J449 Chronic obstructive pulmonary disease, unspecified: Secondary | ICD-10-CM | POA: Diagnosis not present

## 2020-02-24 DIAGNOSIS — I10 Essential (primary) hypertension: Secondary | ICD-10-CM | POA: Diagnosis not present

## 2020-02-24 DIAGNOSIS — S0101XA Laceration without foreign body of scalp, initial encounter: Secondary | ICD-10-CM | POA: Diagnosis not present

## 2020-02-24 DIAGNOSIS — Z85038 Personal history of other malignant neoplasm of large intestine: Secondary | ICD-10-CM | POA: Diagnosis not present

## 2020-02-24 DIAGNOSIS — W1830XA Fall on same level, unspecified, initial encounter: Secondary | ICD-10-CM | POA: Diagnosis not present

## 2020-02-24 DIAGNOSIS — Z794 Long term (current) use of insulin: Secondary | ICD-10-CM | POA: Diagnosis not present

## 2020-02-24 DIAGNOSIS — E119 Type 2 diabetes mellitus without complications: Secondary | ICD-10-CM | POA: Diagnosis not present

## 2020-02-24 DIAGNOSIS — R9082 White matter disease, unspecified: Secondary | ICD-10-CM | POA: Diagnosis not present

## 2020-02-27 DIAGNOSIS — H40033 Anatomical narrow angle, bilateral: Secondary | ICD-10-CM | POA: Diagnosis not present

## 2020-02-27 DIAGNOSIS — H04123 Dry eye syndrome of bilateral lacrimal glands: Secondary | ICD-10-CM | POA: Diagnosis not present

## 2020-03-04 DIAGNOSIS — M5416 Radiculopathy, lumbar region: Secondary | ICD-10-CM | POA: Diagnosis not present

## 2020-03-04 DIAGNOSIS — M48061 Spinal stenosis, lumbar region without neurogenic claudication: Secondary | ICD-10-CM | POA: Diagnosis not present

## 2020-03-05 DIAGNOSIS — G894 Chronic pain syndrome: Secondary | ICD-10-CM | POA: Diagnosis not present

## 2020-03-05 DIAGNOSIS — T148XXA Other injury of unspecified body region, initial encounter: Secondary | ICD-10-CM | POA: Diagnosis not present

## 2020-03-05 DIAGNOSIS — Z681 Body mass index (BMI) 19 or less, adult: Secondary | ICD-10-CM | POA: Diagnosis not present

## 2020-03-14 ENCOUNTER — Other Ambulatory Visit: Payer: Self-pay

## 2020-03-14 ENCOUNTER — Ambulatory Visit: Payer: Medicare HMO | Admitting: Podiatry

## 2020-03-14 DIAGNOSIS — Z0100 Encounter for examination of eyes and vision without abnormal findings: Secondary | ICD-10-CM | POA: Diagnosis not present

## 2020-03-14 DIAGNOSIS — L97521 Non-pressure chronic ulcer of other part of left foot limited to breakdown of skin: Secondary | ICD-10-CM | POA: Diagnosis not present

## 2020-03-14 MED ORDER — CEPHALEXIN 500 MG PO CAPS: 500.0000 mg | ORAL_CAPSULE | Freq: Two times a day (BID) | ORAL | 0 refills | Status: DC

## 2020-03-14 MED ORDER — SILVER SULFADIAZINE 1 % EX CREA: TOPICAL_CREAM | CUTANEOUS | 0 refills | Status: DC

## 2020-03-25 ENCOUNTER — Other Ambulatory Visit: Payer: Medicare HMO | Admitting: Orthotics

## 2020-03-28 DIAGNOSIS — M5416 Radiculopathy, lumbar region: Secondary | ICD-10-CM | POA: Diagnosis not present

## 2020-03-28 DIAGNOSIS — M48061 Spinal stenosis, lumbar region without neurogenic claudication: Secondary | ICD-10-CM | POA: Diagnosis not present

## 2020-04-03 NOTE — Progress Notes (Signed)
  Subjective:  Patient ID: Debra [name], female    DOB: 1929/03/31,  MRN: [id number]  Chief Complaint  Patient presents with  . Toe Pain    Left great toe pain. On Monday pt woke up and her toe was green and throbbing. Today her toe is red and slighly swollen. The only aggravating factors the pt has is walking and putting on her shoes. The only treat is that the pt is putting neosporin on the area.     86 y.o. female presents with the above complaint. History confirmed with patient.   Objective:  Physical Exam: warm, good capillary refill, no trophic changes or ulcerative lesions, normal DP and PT pulses and normal sensory exam. Left Foot: Ulcer to the left great toe with mild warmth, erythema. Hyperkeratosis Right Foot: normal exam, no swelling, tenderness, instability; ligaments intact, full range of motion of all ankle/foot joints   Assessment:   1. Ulcer of great toe, left, limited to breakdown of skin [facility])      Plan:  Patient was evaluated and treated and all questions answered.  Ulcer left hallux -Dressing applied consisting of silvadene and band-aid -Wound cleansed and debrided  -Rx Keflex and Silvadene.  -Patient to apply Silvadene and bandage daily  No follow-ups on file.

## 2020-04-10 DIAGNOSIS — M5136 Other intervertebral disc degeneration, lumbar region: Secondary | ICD-10-CM | POA: Diagnosis not present

## 2020-04-10 DIAGNOSIS — M48061 Spinal stenosis, lumbar region without neurogenic claudication: Secondary | ICD-10-CM | POA: Diagnosis not present

## 2020-04-28 DIAGNOSIS — M5416 Radiculopathy, lumbar region: Secondary | ICD-10-CM | POA: Diagnosis not present

## 2020-04-28 DIAGNOSIS — M48061 Spinal stenosis, lumbar region without neurogenic claudication: Secondary | ICD-10-CM | POA: Diagnosis not present

## 2020-04-29 ENCOUNTER — Ambulatory Visit: Payer: Medicare HMO | Admitting: Orthotics

## 2020-04-29 ENCOUNTER — Other Ambulatory Visit: Payer: Self-pay

## 2020-04-29 ENCOUNTER — Ambulatory Visit: Payer: Medicare HMO | Admitting: Podiatry

## 2020-04-29 DIAGNOSIS — L97521 Non-pressure chronic ulcer of other part of left foot limited to breakdown of skin: Secondary | ICD-10-CM

## 2020-04-29 DIAGNOSIS — B351 Tinea unguium: Secondary | ICD-10-CM

## 2020-04-29 DIAGNOSIS — E1142 Type 2 diabetes mellitus with diabetic polyneuropathy: Secondary | ICD-10-CM

## 2020-04-29 DIAGNOSIS — M2032 Hallux varus (acquired), left foot: Secondary | ICD-10-CM

## 2020-04-29 DIAGNOSIS — M2041 Other hammer toe(s) (acquired), right foot: Secondary | ICD-10-CM

## 2020-04-29 NOTE — Progress Notes (Signed)

## 2020-04-29 NOTE — Progress Notes (Signed)
  Subjective:  Patient ID: Debra [name], female    DOB: 02-Oct-1928,  MRN: [id number]  Chief Complaint  Patient presents with  . Foot Ulcer    Left 1st ulcer, pt states healing, denies fever/nausea/vomiting/chills. Pt states painful still.   86 y.o. female presents with the above complaint. History confirmed with patient.   Objective:  Physical Exam: warm, good capillary refill, no trophic changes or ulcerative lesions, normal DP and PT pulses and normal sensory exam. Nails thickened, dystrophic Left Foot: Ulcer to the left great toe with only pinpoint granular area  Assessment:   1. Ulcer of great toe, left, limited to breakdown of skin ([place])   2. Onychomycosis      Plan:  Patient was evaluated and treated and all questions answered.  Ulcer left hallux -Essentially healed. Small residual granular area cauterized with silver nitrate.  DM with DPN -Nails debrided x10   Procedure: Nail Debridement Rationale: Patient meets criteria for routine foot care due to DPN Type of Debridement: manual, sharp debridement. Instrumentation: Nail nipper, rotary burr. Number of Nails: 10     No follow-ups on file.

## 2020-05-16 DIAGNOSIS — Z681 Body mass index (BMI) 19 or less, adult: Secondary | ICD-10-CM | POA: Diagnosis not present

## 2020-05-16 DIAGNOSIS — Z0001 Encounter for general adult medical examination with abnormal findings: Secondary | ICD-10-CM | POA: Diagnosis not present

## 2020-05-16 DIAGNOSIS — E1165 Type 2 diabetes mellitus with hyperglycemia: Secondary | ICD-10-CM | POA: Diagnosis not present

## 2020-05-16 DIAGNOSIS — E039 Hypothyroidism, unspecified: Secondary | ICD-10-CM | POA: Diagnosis not present

## 2020-05-16 DIAGNOSIS — E782 Mixed hyperlipidemia: Secondary | ICD-10-CM | POA: Diagnosis not present

## 2020-05-16 DIAGNOSIS — I2510 Atherosclerotic heart disease of native coronary artery without angina pectoris: Secondary | ICD-10-CM | POA: Diagnosis not present

## 2020-05-16 DIAGNOSIS — Z794 Long term (current) use of insulin: Secondary | ICD-10-CM | POA: Diagnosis not present

## 2020-05-16 DIAGNOSIS — Z1389 Encounter for screening for other disorder: Secondary | ICD-10-CM | POA: Diagnosis not present

## 2020-05-16 DIAGNOSIS — J449 Chronic obstructive pulmonary disease, unspecified: Secondary | ICD-10-CM | POA: Diagnosis not present

## 2020-05-16 DIAGNOSIS — E559 Vitamin D deficiency, unspecified: Secondary | ICD-10-CM | POA: Diagnosis not present

## 2020-05-16 DIAGNOSIS — G894 Chronic pain syndrome: Secondary | ICD-10-CM | POA: Diagnosis not present

## 2020-05-16 DIAGNOSIS — Z1331 Encounter for screening for depression: Secondary | ICD-10-CM | POA: Diagnosis not present

## 2020-05-20 DIAGNOSIS — G894 Chronic pain syndrome: Secondary | ICD-10-CM | POA: Diagnosis not present

## 2020-05-20 DIAGNOSIS — Z1331 Encounter for screening for depression: Secondary | ICD-10-CM | POA: Diagnosis not present

## 2020-05-20 DIAGNOSIS — E1165 Type 2 diabetes mellitus with hyperglycemia: Secondary | ICD-10-CM | POA: Diagnosis not present

## 2020-05-20 DIAGNOSIS — Z0001 Encounter for general adult medical examination with abnormal findings: Secondary | ICD-10-CM | POA: Diagnosis not present

## 2020-05-20 DIAGNOSIS — Z681 Body mass index (BMI) 19 or less, adult: Secondary | ICD-10-CM | POA: Diagnosis not present

## 2020-05-20 DIAGNOSIS — I2510 Atherosclerotic heart disease of native coronary artery without angina pectoris: Secondary | ICD-10-CM | POA: Diagnosis not present

## 2020-05-20 DIAGNOSIS — J449 Chronic obstructive pulmonary disease, unspecified: Secondary | ICD-10-CM | POA: Diagnosis not present

## 2020-05-20 DIAGNOSIS — E039 Hypothyroidism, unspecified: Secondary | ICD-10-CM | POA: Diagnosis not present

## 2020-06-13 ENCOUNTER — Ambulatory Visit: Payer: Medicare HMO | Admitting: Orthotics

## 2020-06-17 ENCOUNTER — Ambulatory Visit: Payer: Medicare HMO | Admitting: Cardiology

## 2020-06-17 NOTE — Progress Notes (Deleted)
Cardiology Office Note  Date: 06/17/2020   ID: Debra [name], DOB 03-07-1929, MRN [id number]  PCP:  Sharilyn Sites, MD  Cardiologist:  Rozann Lesches, MD Electrophysiologist:  None   No chief complaint on file.   History of Present Illness: Debra [name] is an 86 y.o. female last seen in June by Ms. Strader PA-C.  Lab work in July showed creatinine 1.45 down from 1.70, potassium normal range.  Past Medical History:  Diagnosis Date  . Arthritis   . CKD (chronic kidney disease) stage 4, GFR 15-29 ml/min (HCC)   . Colon cancer ([place])   . COPD (chronic obstructive pulmonary disease) ([place])   . Coronary artery disease    a. s/p CABG in 1997 (no detials provided) b. 12/2017: NSTEMI with cath showing patent LIMA-LAD with occluded SVG-OM and presumed occluded SVG-RCA but unable to be visualized, RCA was filled by collaterals. Medical management recommended. c.  07/2019: NSTEMI with rapid atrial fibrillation, stable coronary and bypass graft anatomy by cardiac catheterization  . DDD (degenerative disc disease), lumbar   . Diabetic neuropathy ([place])   . Essential hypertension   . GERD (gastroesophageal reflux disease)   . Gout   . Hypothyroidism   . Ischemic cardiomyopathy   . Restless leg syndrome   . Type 2 diabetes mellitus ([place])     Past Surgical History:  Procedure Laterality Date  . ABDOMINAL HYSTERECTOMY    . CHOLECYSTECTOMY    . COLON RESECTION    . CORONARY ARTERY BYPASS GRAFT    . ILIAC ARTERY STENT    . LEFT HEART CATH AND CORS/GRAFTS ANGIOGRAPHY N/A 12/28/2017   Procedure: LEFT HEART CATH AND CORS/GRAFTS ANGIOGRAPHY;  Surgeon: Martinique, Peter M, MD;  Location: [place] [facility];  Service: Cardiovascular;  Laterality: N/A;  . LEFT HEART CATH AND CORS/GRAFTS ANGIOGRAPHY N/A 08/01/2019   Procedure: LEFT HEART CATH AND CORS/GRAFTS ANGIOGRAPHY;  Surgeon: Burnell Blanks, MD;  Location: [place] [facility];  Service: Cardiovascular;  Laterality: N/A;  . LUNG SURGERY  Left 2012   left lung resection  . RENAL ARTERY STENT    . THYROID SURGERY    . ULTRASOUND GUIDANCE FOR VASCULAR ACCESS  12/28/2017   Procedure: Ultrasound Guidance For Vascular Access;  Surgeon: Martinique, Peter M, MD;  Location: [place] [facility];  Service: Cardiovascular;;    Current Outpatient Medications  Medication Sig Dispense Refill  . ACCU-CHEK AVIVA PLUS test strip     . Accu-Chek Softclix Lancets lancets     . acetaminophen (TYLENOL) 325 MG tablet Take 650 mg by mouth every 6 (six) hours as needed for mild pain.    Marland Kitchen albuterol (VENTOLIN HFA) 108 (90 Base) MCG/ACT inhaler Inhale 1 puff into the lungs every 6 (six) hours as needed for shortness of breath or wheezing.    . Alcohol Swabs (B-D SINGLE USE SWABS REGULAR) PADS     . amiodarone (PACERONE) 100 MG tablet Take 1 tablet (100 mg total) by mouth daily. (Patient taking differently: Take 50 mg by mouth 2 (two) times daily. ) 90 tablet 3  . amoxicillin-clavulanate (AUGMENTIN) 875-125 MG tablet     . apixaban (ELIQUIS) 2.5 MG TABS tablet Take 1 tablet (2.5 mg total) by mouth 2 (two) times daily. 180 tablet 3  . atorvastatin (LIPITOR) 80 MG tablet Take 1 tablet (80 mg total) by mouth daily at 6 PM. 30 tablet 6  . BD PEN NEEDLE NANO 2ND GEN 32G X 4 MM MISC     .  Blood Glucose Monitoring Suppl (ACCU-CHEK GUIDE ME) w/Device KIT     . cephALEXin (KEFLEX) 500 MG capsule Take 1 capsule (500 mg total) by mouth 2 (two) times daily. 14 capsule 0  . Cholecalciferol (VITAMIN D) 125 MCG (5000 UT) CAPS Take 1 capsule by mouth daily.    . diazepam (VALIUM) 2 MG tablet Take 2 mg by mouth daily as needed.    . doxycycline (VIBRA-TABS) 100 MG tablet     . fluconazole (DIFLUCAN) 150 MG tablet     . gabapentin (NEURONTIN) 100 MG capsule Take 100 mg by mouth 3 (three) times daily.     Marland Kitchen gabapentin (NEURONTIN) 300 MG capsule     . HYDROcodone-acetaminophen (NORCO) 10-325 MG tablet Take 1 tablet by mouth every 6 (six) hours as needed for moderate pain or  severe pain. 1 tab po hs/ and prn.    . insulin glargine (LANTUS) 100 unit/mL SOPN Inject 0.1 mLs (10 Units total) into the skin at bedtime.    Marland Kitchen levocetirizine (XYZAL) 5 MG tablet     . levothyroxine (SYNTHROID) 125 MCG tablet     . levothyroxine (SYNTHROID) 50 MCG tablet Take 1 tablet (50 mcg total) by mouth daily at 6 (six) AM. 30 tablet 2  . Lidocaine HCl-Benzyl Alcohol (SALONPAS LIDOCAINE PLUS) 4-10 % LIQD Apply 1 application topically as needed.    . Menthol, Topical Analgesic, (ASPERCREME MAX ROLL-ON) 16 % LIQD Apply 1 application topically as needed.    . mirtazapine (REMERON) 7.5 MG tablet     . pantoprazole (PROTONIX) 40 MG tablet Take 40 mg by mouth daily.     . polyethylene glycol (MIRALAX / GLYCOLAX) 17 g packet Take 17 g by mouth daily as needed for mild constipation. 14 each 0  . rOPINIRole (REQUIP) 2 MG tablet Take 2 mg by mouth at bedtime.     . sacubitril-valsartan (ENTRESTO) 24-26 MG Take 1 tablet by mouth 2 (two) times daily. 180 tablet 3  . silver sulfADIAZINE (SILVADENE) 1 % cream Apply pea-sized amount to wound daily. 50 g 0   No current facility-administered medications for this visit.   Allergies:  Ciprofloxacin, Iodine, and Iodinated diagnostic agents   Social History: The patient  reports that she quit smoking about 24 years ago. Her smoking use included cigarettes. She has never used smokeless tobacco. She reports that she does not drink alcohol and does not use drugs.   Family History: The patient's family history includes Breast cancer in her daughter; Colon cancer in her mother; Heart attack in her sister; Heart disease in her father.   ROS:  Please see the history of present illness. Otherwise, complete review of systems is positive for {NONE DEFAULTED:18576::"none"}.  All other systems are reviewed and negative.   Physical Exam: VS:  There were no vitals taken for this visit., BMI There is no height or weight on file to calculate BMI.  Wt Readings from Last  3 Encounters:  12/13/19 129 lb 12.8 oz (58.9 kg)  10/12/19 123 lb (55.8 kg)  10/03/19 125 lb 6.4 oz (56.9 kg)    General: Patient appears comfortable at rest. HEENT: Conjunctiva and lids normal, oropharynx clear with moist mucosa. Neck: Supple, no elevated JVP or carotid bruits, no thyromegaly. Lungs: Clear to auscultation, nonlabored breathing at rest. Cardiac: Regular rate and rhythm, no S3 or significant systolic murmur, no pericardial rub. Abdomen: Soft, nontender, no hepatomegaly, bowel sounds present, no guarding or rebound. Extremities: No pitting edema, distal pulses 2+. Skin: Warm  and dry. Musculoskeletal: No kyphosis. Neuropsychiatric: Alert and oriented x3, affect grossly appropriate.  ECG:  {EKG/Telemetry Strips [phone]}  Recent Labwork: 07/30/2019: ALT 13; AST 17; TSH 0.019 08/01/2019: Hemoglobin 11.0; Magnesium 2.0; Platelets 207 01/25/2020: BUN 19; Creatinine, Ser 1.45; Potassium 4.0; Sodium 140     Component Value Date/Time   CHOL 89 12/28/2017 0334   TRIG 73 12/28/2017 0334   HDL 36 (L) 12/28/2017 0334   CHOLHDL 2.5 12/28/2017 0334   VLDL 15 12/28/2017 0334   LDLCALC 38 12/28/2017 0334    Other Studies Reviewed Today:  Echocardiogram 07/31/2019: 1. Left ventricular ejection fraction, by visual estimation, is 35 to  40%. The left ventricle has moderately decreased function. There is no  left ventricular hypertrophy.  2. Mild dyskinesis of the left ventricular, basal-mid inferoseptal wall  and inferior wall (consistent with previous infarction in the right  coronary artery distribution).  3. Severe hypokinesis of the left ventricular, basal anteroseptal wall.  The apical segments contract normally (consistent with occlusion of the  proximal LAD artery with patent graft to the distal vessel).  4. Abnormal septal motion consistent with post-operative status.  5. Elevated left atrial pressure.  6. Left ventricular diastolic parameters are  consistent with Grade II  diastolic dysfunction (pseudonormalization).  7. The left ventricle demonstrates regional wall motion abnormalities.  8. Global right ventricle has normal systolic function.The right  ventricular size is normal. No increase in right ventricular wall  thickness.  9. Left atrial size was mildly dilated.  10. Right atrial size was normal.  11. Mild mitral annular calcification.  12. The mitral valve is normal in structure. Mild to moderate mitral valve  regurgitation.  13. The tricuspid valve is normal in structure.  14. The tricuspid valve is normal in structure. Tricuspid valve  regurgitation is not demonstrated.  15. The aortic valve is normal in structure. Aortic valve regurgitation is  not visualized. Mild aortic valve sclerosis without stenosis.  16. The pulmonic valve was grossly normal. Pulmonic valve regurgitation is  trivial.  17. TR signal is inadequate for assessing pulmonary artery systolic  pressure.  18. The inferior vena cava is dilated in size with <50% respiratory  variability, suggesting right atrial pressure of 15 mmHg.  19. No significant change from prior study.  20. Prior images reviewed side by side.   Cardiac catheterization 08/01/2019:  Ost LAD to Prox LAD lesion is 100% stenosed.  Ost 1st Mrg to 1st Mrg lesion is 40% stenosed.  Ost 2nd Mrg lesion is 30% stenosed.  Ost 3rd Mrg lesion is 90% stenosed.  Ost RCA to Dist RCA lesion is 100% stenosed.  LIMA and is normal in caliber.  The graft exhibits no disease.  SVG.  Origin to Prox Graft lesion is 100% stenosed.  SVG due to known occlusion.  Origin to Prox Graft lesion is 100% stenosed.  Prox Cx to Mid Cx lesion is 10% stenosed.   1. Triple vessel CAD s/p CABG with 1 patent bypass graft 2. Occluded proximal LAD. Patent LIMA to mid LAD 3. Known occlusion of the SVG to OM. The mid Circumflex stent is patent. Severe disease in a small caliber OM branch that fills from  left to left collaterals. Unchanged from last cath.  4. Chronic occlusion of the proximal RCA. Known occlusion of the vein graft to the distal RCA. The distal RCA fills from left to right collaterals.   Recommendations: Coronary disease unchanged from last cath in 2019. Continue medical management of CAD.  Assessment and Plan:    Medication Adjustments/Labs and Tests Ordered: Current medicines are reviewed at length with the patient today.  Concerns regarding medicines are outlined above.   Tests Ordered: No orders of the defined types were placed in this encounter.   Medication Changes: No orders of the defined types were placed in this encounter.   Disposition:  Follow up {follow up:15908}  Signed, [name], MD, [facility] 06/17/2020 8:57 AM    [facility] [place], [place], [place] [address] Phone: [phone]; Fax: [phone]

## 2020-06-18 DIAGNOSIS — G894 Chronic pain syndrome: Secondary | ICD-10-CM | POA: Diagnosis not present

## 2020-06-20 ENCOUNTER — Ambulatory Visit: Payer: Medicare HMO | Admitting: Orthotics

## 2020-07-01 ENCOUNTER — Ambulatory Visit: Payer: Medicare HMO | Admitting: Orthotics

## 2020-07-11 ENCOUNTER — Other Ambulatory Visit: Payer: Self-pay

## 2020-07-11 ENCOUNTER — Ambulatory Visit (INDEPENDENT_AMBULATORY_CARE_PROVIDER_SITE_OTHER): Payer: Medicare HMO | Admitting: Orthotics

## 2020-07-11 DIAGNOSIS — E119 Type 2 diabetes mellitus without complications: Secondary | ICD-10-CM

## 2020-07-11 DIAGNOSIS — E1142 Type 2 diabetes mellitus with diabetic polyneuropathy: Secondary | ICD-10-CM

## 2020-07-11 DIAGNOSIS — M2041 Other hammer toe(s) (acquired), right foot: Secondary | ICD-10-CM

## 2020-07-11 DIAGNOSIS — L89891 Pressure ulcer of other site, stage 1: Secondary | ICD-10-CM

## 2020-07-11 DIAGNOSIS — M2042 Other hammer toe(s) (acquired), left foot: Secondary | ICD-10-CM

## 2020-07-14 DIAGNOSIS — M9903 Segmental and somatic dysfunction of lumbar region: Secondary | ICD-10-CM | POA: Diagnosis not present

## 2020-07-14 DIAGNOSIS — M25552 Pain in left hip: Secondary | ICD-10-CM | POA: Diagnosis not present

## 2020-07-14 DIAGNOSIS — M5442 Lumbago with sciatica, left side: Secondary | ICD-10-CM | POA: Diagnosis not present

## 2020-07-14 DIAGNOSIS — M9905 Segmental and somatic dysfunction of pelvic region: Secondary | ICD-10-CM | POA: Diagnosis not present

## 2020-07-14 DIAGNOSIS — M9902 Segmental and somatic dysfunction of thoracic region: Secondary | ICD-10-CM | POA: Diagnosis not present

## 2020-07-24 DIAGNOSIS — I2510 Atherosclerotic heart disease of native coronary artery without angina pectoris: Secondary | ICD-10-CM | POA: Diagnosis not present

## 2020-07-24 DIAGNOSIS — G894 Chronic pain syndrome: Secondary | ICD-10-CM | POA: Diagnosis not present

## 2020-07-24 DIAGNOSIS — M48061 Spinal stenosis, lumbar region without neurogenic claudication: Secondary | ICD-10-CM | POA: Diagnosis not present

## 2020-07-24 DIAGNOSIS — I10 Essential (primary) hypertension: Secondary | ICD-10-CM | POA: Diagnosis not present

## 2020-07-25 DIAGNOSIS — M5442 Lumbago with sciatica, left side: Secondary | ICD-10-CM | POA: Diagnosis not present

## 2020-07-25 DIAGNOSIS — M9902 Segmental and somatic dysfunction of thoracic region: Secondary | ICD-10-CM | POA: Diagnosis not present

## 2020-07-25 DIAGNOSIS — M25552 Pain in left hip: Secondary | ICD-10-CM | POA: Diagnosis not present

## 2020-07-25 DIAGNOSIS — M9903 Segmental and somatic dysfunction of lumbar region: Secondary | ICD-10-CM | POA: Diagnosis not present

## 2020-07-25 DIAGNOSIS — M9905 Segmental and somatic dysfunction of pelvic region: Secondary | ICD-10-CM | POA: Diagnosis not present

## 2020-07-29 ENCOUNTER — Ambulatory Visit: Payer: Medicare HMO | Admitting: Cardiology

## 2020-07-30 DIAGNOSIS — H903 Sensorineural hearing loss, bilateral: Secondary | ICD-10-CM | POA: Diagnosis not present

## 2020-08-01 ENCOUNTER — Ambulatory Visit: Payer: Medicare HMO | Admitting: Podiatry

## 2020-08-22 ENCOUNTER — Ambulatory Visit: Payer: Medicare HMO | Admitting: Podiatry

## 2020-08-22 ENCOUNTER — Other Ambulatory Visit: Payer: Self-pay

## 2020-08-22 DIAGNOSIS — D689 Coagulation defect, unspecified: Secondary | ICD-10-CM

## 2020-08-22 DIAGNOSIS — B351 Tinea unguium: Secondary | ICD-10-CM

## 2020-08-22 DIAGNOSIS — L84 Corns and callosities: Secondary | ICD-10-CM

## 2020-08-22 NOTE — Progress Notes (Signed)
  Subjective:  Patient ID: Debra [name], female    DOB: [birth year],  MRN: [id number]  Chief Complaint  Patient presents with  . Foot Ulcer    Left hallux ulcer check. Some redness noted, pt denies drainage/fever/nausea/vomiting/chills.    87 y.o. female presents with the above complaint. History confirmed with patient.   Objective:  Physical Exam: warm, good capillary refill, no trophic changes or ulcerative lesions, normal DP and PT pulses and normal sensory exam. Nails thickened, dystrophic Left Foot: preulcerative HPK left hallux  Assessment:   1. Onychomycosis   2. Pre-ulcerative calluses   3. Coagulation defect [facility])    Plan:  Patient was evaluated and treated and all questions answered.  Pre-ulcer left hallux -Small HPK noted. Debrided as below -Dispensed silicone toe cap  DM with Coag defect -Nails debrided x10  Procedure: Nail Debridement Type of Debridement: manual, sharp debridement. Instrumentation: Nail nipper, rotary burr. Number of Nails: 10  No follow-ups on file.

## 2020-08-26 ENCOUNTER — Encounter: Payer: Self-pay | Admitting: Cardiology

## 2020-08-26 ENCOUNTER — Ambulatory Visit: Payer: Medicare HMO | Admitting: Cardiology

## 2020-08-26 ENCOUNTER — Other Ambulatory Visit: Payer: Self-pay

## 2020-08-26 ENCOUNTER — Other Ambulatory Visit (HOSPITAL_COMMUNITY)
Admission: RE | Admit: 2020-08-26 | Discharge: 2020-08-26 | Disposition: A | Discharge status: Home / Self Care | Payer: Medicare HMO | Source: Ambulatory Visit | Attending: Cardiology | Admitting: Cardiology

## 2020-08-26 VITALS — BP 150/80 | HR 70 | Ht 67.0 in | Wt 116.0 lb

## 2020-08-26 DIAGNOSIS — I480 Paroxysmal atrial fibrillation: Secondary | ICD-10-CM

## 2020-08-26 DIAGNOSIS — I255 Ischemic cardiomyopathy: Secondary | ICD-10-CM | POA: Diagnosis not present

## 2020-08-26 LAB — BASIC METABOLIC PANEL
Anion gap: 6 (ref 5–15)
BUN: 22 mg/dL (ref 8–23)
CO2: 26 mmol/L (ref 22–32)
Calcium: 8.8 mg/dL — ABNORMAL LOW (ref 8.9–10.3)
Chloride: 107 mmol/L (ref 98–111)
Creatinine, Ser: 1.31 mg/dL — ABNORMAL HIGH (ref 0.44–1.00)
GFR, Estimated: 39 mL/min — ABNORMAL LOW (ref >60)
Glucose, Bld: 108 mg/dL — ABNORMAL HIGH (ref 70–99)
Potassium: 3.9 mmol/L (ref 3.5–5.1)
Sodium: 139 mmol/L (ref 135–145)

## 2020-08-26 NOTE — Patient Instructions (Signed)
Medication Instructions:  Your physician recommends that you continue on your current medications as directed. Please refer to the Current Medication list given to you today.  *If you need a refill on your cardiac medications before your next appointment, please call your pharmacy*   Lab Work: BMET today If you have labs (blood work) drawn today and your tests are completely normal, you will receive your results only by: Marland Kitchen MyChart Message (if you have MyChart) OR . A paper copy in the mail If you have any lab test that is abnormal or we need to change your treatment, we will call you to review the results.   Testing/Procedures: Your physician has requested that you have an echocardiogram. Echocardiography is a painless test that uses sound waves to create images of your heart. It provides your doctor with information about the size and shape of your heart and how well your heart's chambers and valves are working. This procedure takes approximately one hour. There are no restrictions for this procedure.     Follow-Up: At [facility], you and your health needs are our priority.  As part of our continuing mission to provide you with exceptional heart care, we have created designated Provider Care Teams.  These Care Teams include your primary Cardiologist (physician) and Advanced Practice Providers (APPs -  Physician Assistants and Nurse Practitioners) who all work together to provide you with the care you need, when you need it.  We recommend signing up for the patient portal called "MyChart".  Sign up information is provided on this After Visit Summary.  MyChart is used to connect with patients for Virtual Visits (Telemedicine).  Patients are able to view lab/test results, encounter notes, upcoming appointments, etc.  Non-urgent messages can be sent to your provider as well.   To learn more about what you can do with MyChart, go to [email or web address].    Your next appointment:   3  month(s)  The format for your next appointment:   In Person  Provider:   Rozann Lesches, MD   Other Instructions None     Thank you for choosing [facility] !

## 2020-08-26 NOTE — Progress Notes (Signed)
Cardiology Office Note  Date: 08/26/2020   ID: Debra, [name] [birth year], MRN [id number]  PCP:  Debra Sites, MD  Cardiologist:  Debra Lesches, MD Electrophysiologist:  None   Chief Complaint  Patient presents with  . Cardiac follow-up    History of Present Illness: Debra [name] is an 87 y.o. female last seen in June 2021 by Ms. Strader PA-C.  She presents for a routine visit.  Reports NYHA class II dyspnea and no active angina at this time with low-level activity.  She has been having a lot of left hip and leg pain, considering joint replacement.  I reviewed her current medications which are listed below.  Blood pressure increased today, has not generally been in this range on previous checks.  I asked her to start watching this more closely with her automatic cuff at home.  She states that she has been compliant with therapy.  She is due for follow-up lab work.  Her last echocardiogram was in January 2021 at which point LVEF was 35 to 40%, wall motion abnormalities consistent with ischemic cardiomyopathy.  As noted previously, she did not tolerate beta-blocker due to symptomatic bradycardia.  No recent palpitations with history of PAF.  She does not report any spontaneous bleeding problems.  Past Medical History:  Diagnosis Date  . Arthritis   . CKD (chronic kidney disease) stage 4, GFR 15-29 ml/min (HCC)   . Colon cancer (Debra [name])   . COPD (chronic obstructive pulmonary disease) ([place])   . Coronary artery disease    a. s/p CABG in 1997 (no detials provided) b. 12/2017: NSTEMI with cath showing patent LIMA-LAD with occluded SVG-OM and presumed occluded SVG-RCA but unable to be visualized, RCA was filled by collaterals. Medical management recommended. c.  07/2019: NSTEMI with rapid atrial fibrillation, stable coronary and bypass graft anatomy by cardiac catheterization  . DDD (degenerative disc disease), lumbar   . Diabetic neuropathy (Debra [name])   . Essential hypertension   . GERD  (gastroesophageal reflux disease)   . Gout   . Hypothyroidism   . Ischemic cardiomyopathy   . Restless leg syndrome   . Type 2 diabetes mellitus (Debra [name])     Past Surgical History:  Procedure Laterality Date  . ABDOMINAL HYSTERECTOMY    . CHOLECYSTECTOMY    . COLON RESECTION    . CORONARY ARTERY BYPASS GRAFT    . ILIAC ARTERY STENT    . LEFT HEART CATH AND CORS/GRAFTS ANGIOGRAPHY N/A 12/28/2017   Procedure: LEFT HEART CATH AND CORS/GRAFTS ANGIOGRAPHY;  Surgeon: Martinique, Peter M, MD;  Location: [facility];  Service: Cardiovascular;  Laterality: N/A;  . LEFT HEART CATH AND CORS/GRAFTS ANGIOGRAPHY N/A 08/01/2019   Procedure: LEFT HEART CATH AND CORS/GRAFTS ANGIOGRAPHY;  Surgeon: Debra Blanks, MD;  Location: [facility];  Service: Cardiovascular;  Laterality: N/A;  . LUNG SURGERY Left 2012   left lung resection  . RENAL ARTERY STENT    . THYROID SURGERY    . ULTRASOUND GUIDANCE FOR VASCULAR ACCESS  12/28/2017   Procedure: Ultrasound Guidance For Vascular Access;  Surgeon: Martinique, Peter M, MD;  Location: [facility];  Service: Cardiovascular;;    Current Outpatient Medications  Medication Sig Dispense Refill  . ACCU-CHEK AVIVA PLUS test strip     . Accu-Chek Softclix Lancets lancets     . acetaminophen (TYLENOL) 325 MG tablet Take 650 mg by mouth every 6 (six) hours as needed for mild pain.    Marland Kitchen  albuterol (VENTOLIN HFA) 108 (90 Base) MCG/ACT inhaler Inhale 1 puff into the lungs every 6 (six) hours as needed for shortness of breath or wheezing.    . Alcohol Swabs (B-D SINGLE USE SWABS REGULAR) PADS     . amiodarone (PACERONE) 100 MG tablet Take 50 mg by mouth daily. Twice a day    . apixaban (ELIQUIS) 2.5 MG TABS tablet Take 1 tablet (2.5 mg total) by mouth 2 (two) times daily. 180 tablet 3  . atorvastatin (LIPITOR) 80 MG tablet Take 1 tablet (80 mg total) by mouth daily at 6 PM. 30 tablet 6  . BD PEN NEEDLE NANO 2ND GEN 32G X 4 MM MISC     . Blood Glucose  Monitoring Suppl (ACCU-CHEK GUIDE ME) w/Device KIT     . Cholecalciferol (VITAMIN D) 125 MCG (5000 UT) CAPS Take 1 capsule by mouth daily.    . diazepam (VALIUM) 2 MG tablet Take 2 mg by mouth daily as needed.    . gabapentin (NEURONTIN) 100 MG capsule Take 100 mg by mouth 3 (three) times daily.     Marland Kitchen HYDROcodone-acetaminophen (NORCO) 10-325 MG tablet Take 1 tablet by mouth every 6 (six) hours as needed for moderate pain or severe pain. 1 tab po hs/ and prn.    . insulin glargine (LANTUS) 100 unit/mL SOPN Inject 0.1 mLs (10 Units total) into the skin at bedtime.    Marland Kitchen levothyroxine (SYNTHROID) 50 MCG tablet Take 1 tablet (50 mcg total) by mouth daily at 6 (six) AM. 30 tablet 2  . Menthol, Topical Analgesic, (ASPERCREME MAX ROLL-ON) 16 % LIQD Apply 1 application topically as needed.    . mirtazapine (REMERON) 7.5 MG tablet Take 7.5 mg by mouth at bedtime.    . pantoprazole (PROTONIX) 40 MG tablet Take 40 mg by mouth daily.     . polyethylene glycol (MIRALAX / GLYCOLAX) 17 g packet Take 17 g by mouth daily as needed for mild constipation. 14 each 0  . rOPINIRole (REQUIP) 2 MG tablet Take 2 mg by mouth at bedtime.     . sacubitril-valsartan (ENTRESTO) 24-26 MG Take 1 tablet by mouth 2 (two) times daily. 180 tablet 3  . doxycycline (VIBRA-TABS) 100 MG tablet     . levothyroxine (SYNTHROID) 150 MCG tablet      No current facility-administered medications for this visit.   Allergies:  Ciprofloxacin, Iodine, and Iodinated diagnostic agents   ROS:  Hearing loss.  Arthritic hip pain.  Physical Exam: VS:  BP (!) 150/80   Pulse 70   Ht 5' 7"  (1.702 [name])   Wt 116 lb (52.6 kg)   SpO2 97%   BMI 18.17 kg/[name] , BMI Body mass index is 18.17 kg/[name].  Wt Readings from Last 3 Encounters:  08/26/20 116 lb (52.6 kg)  12/13/19 129 lb 12.8 oz (58.9 kg)  10/12/19 123 lb (55.8 kg)    General: Elderly woman, using walker.  Patient appears comfortable at rest. HEENT: Conjunctiva and lids normal, wearing a  mask. Neck: Supple, no elevated JVP or carotid bruits, no thyromegaly. Lungs: Clear to auscultation, nonlabored breathing at rest. Cardiac: Regular rate and rhythm, no S3, 2/6 systolic murmur, no pericardial rub. Extremities: No pitting edema.  ECG:  An ECG dated 10/03/2019 was personally reviewed today and demonstrated:  Sinus rhythm with rightward axis.  Recent Labwork: 01/25/2020: BUN 19; Creatinine, Ser 1.45; Potassium 4.0; Sodium 140     Component Value Date/Time   CHOL 89 12/28/2017 0334   TRIG  73 12/28/2017 0334   HDL 36 (L) 12/28/2017 0334   CHOLHDL 2.5 12/28/2017 0334   VLDL 15 12/28/2017 0334   LDLCALC 38 12/28/2017 0334    Other Studies Reviewed Today:  Cardiac catheterization 08/01/2019:  Colon [place] LAD to Prox LAD lesion is 100% stenosed.  Ost 1st Mrg to 1st Mrg lesion is 40% stenosed.  Ost 2nd Mrg lesion is 30% stenosed.  Ost 3rd Mrg lesion is 90% stenosed.  Ost RCA to Dist RCA lesion is 100% stenosed.  LIMA and is normal in caliber.  The graft exhibits no disease.  SVG.  Origin to Prox Graft lesion is 100% stenosed.  SVG due to known occlusion.  Origin to Prox Graft lesion is 100% stenosed.  Prox Cx to Mid Cx lesion is 10% stenosed.  1. Triple vessel CAD s/p CABG with 1 patent bypass graft 2. Occluded proximal LAD. Patent LIMA to mid LAD 3. Known occlusion of the SVG to OM. The mid Circumflex stent is patent. Severe disease in a small caliber OM branch that fills from left to left collaterals. Unchanged from last cath.  4. Chronic occlusion of the proximal RCA. Known occlusion of the vein graft to the distal RCA. The distal RCA fills from left to right collaterals.   Recommendations: Coronary disease unchanged from last cath in 2019. Continue medical management of CAD.  Echocardiogram 07/31/2019: 1. Left ventricular ejection fraction, by visual estimation, is 35 to  40%. The left ventricle has moderately decreased function. There is no  left  ventricular hypertrophy.  2. Mild dyskinesis of the left ventricular, basal-mid inferoseptal wall  and inferior wall (consistent with previous infarction in the right  coronary artery distribution).  3. Severe hypokinesis of the left ventricular, basal anteroseptal wall.  The apical segments contract normally (consistent with occlusion of the  proximal LAD artery with patent graft to the distal vessel).  4. Abnormal septal motion consistent with post-operative status.  5. Elevated left atrial pressure.  6. Left ventricular diastolic parameters are consistent with Grade II  diastolic dysfunction (pseudonormalization).  7. The left ventricle demonstrates regional wall motion abnormalities.  8. Global right ventricle has normal systolic function.The right  ventricular size is normal. No increase in right ventricular wall  thickness.  9. Left atrial size was mildly dilated.  10. Right atrial size was normal.  11. Mild mitral annular calcification.  12. The mitral valve is normal in structure. Mild to moderate mitral valve  regurgitation.  13. The tricuspid valve is normal in structure.  14. The tricuspid valve is normal in structure. Tricuspid valve  regurgitation is not demonstrated.  15. The aortic valve is normal in structure. Aortic valve regurgitation is  not visualized. Mild aortic valve sclerosis without stenosis.  16. The pulmonic valve was grossly normal. Pulmonic valve regurgitation is  trivial.  17. TR signal is inadequate for assessing pulmonary artery systolic  pressure.  18. The inferior vena cava is dilated in size with <50% respiratory  variability, suggesting right atrial pressure of 15 mmHg.  19. No significant change from prior study.  20. Prior images reviewed side by side.   Assessment and Plan:  1.  Multivessel CAD status post CABG in 1997.  She was managed medically for NSTEMI in January of last year at which point cardiac catheterization revealed patent  LIMA to LAD, occlusion of the SVG to OM, patent circumflex stent site, and CTO of the RCA with collaterals.  She does not report any active angina.  Use of  Eliquis.  Continue Lipitor.  2.  Ischemic cardiomyopathy, LVEF 35 to 40% by echocardiogram in January of last year.  She is tolerating Entresto, due for follow-up lab work.  Did not tolerate beta-blocker due to symptomatic bradycardia.  She is not on a standing diuretic at this time.  3.  Paroxysmal atrial fibrillation, maintaining sinus rhythm on low-dose amiodarone and on Eliquis 2.5 mg twice daily for stroke prophylaxis.  Medication Adjustments/Labs and Tests Ordered: Current medicines are reviewed at length with the patient today.  Concerns regarding medicines are outlined above.   Tests Ordered: Orders Placed This Encounter  Procedures  . Basic metabolic panel  . ECHOCARDIOGRAM COMPLETE    Medication Changes: No orders of the defined types were placed in this encounter.   Disposition:  Follow up 3 months in the [place] office.  Signed, [name], MD, [facility] 08/26/2020 3:19 PM    [place] [facility] [address], [place], [place] [address] Phone: [phone]; Fax: [phone]

## 2020-08-28 DIAGNOSIS — G894 Chronic pain syndrome: Secondary | ICD-10-CM | POA: Diagnosis not present

## 2020-09-12 ENCOUNTER — Other Ambulatory Visit: Payer: Self-pay

## 2020-09-12 ENCOUNTER — Ambulatory Visit (INDEPENDENT_AMBULATORY_CARE_PROVIDER_SITE_OTHER): Payer: Medicare HMO

## 2020-09-12 ENCOUNTER — Ambulatory Visit: Payer: Medicare HMO | Admitting: Podiatry

## 2020-09-12 DIAGNOSIS — S90112A Contusion of left great toe without damage to nail, initial encounter: Secondary | ICD-10-CM | POA: Diagnosis not present

## 2020-09-12 NOTE — Progress Notes (Signed)
  Subjective:  Patient ID: Debra [name], female    DOB: 1928-08-13,  MRN: [id number]  Chief Complaint  Patient presents with  . Toe Injury    Left 1st toe bumped yesterday, painful.   87 y.o. female presents with the above complaint. History confirmed with patient.   Objective:  Physical Exam: warm, good capillary refill, no trophic changes or ulcerative lesions, normal DP and PT pulses and normal sensory exam. Nails thickened, dystrophic Left Foot: preulcerative HPK left hallux  Assessment:   1. Contusion of left great toe without damage to nail, initial encounter    Plan:  Patient was evaluated and treated and all questions answered.  Pre-ulcer left hallux -Remains controlled. Continue spacer  Left hallux injury -XR taken no injury.  No follow-ups on file.

## 2020-09-23 ENCOUNTER — Ambulatory Visit: Payer: Medicare HMO | Admitting: Podiatry

## 2020-09-26 DIAGNOSIS — Z681 Body mass index (BMI) 19 or less, adult: Secondary | ICD-10-CM | POA: Diagnosis not present

## 2020-09-26 DIAGNOSIS — Z1331 Encounter for screening for depression: Secondary | ICD-10-CM | POA: Diagnosis not present

## 2020-09-26 DIAGNOSIS — F419 Anxiety disorder, unspecified: Secondary | ICD-10-CM | POA: Diagnosis not present

## 2020-09-26 DIAGNOSIS — G894 Chronic pain syndrome: Secondary | ICD-10-CM | POA: Diagnosis not present

## 2020-10-02 ENCOUNTER — Ambulatory Visit (HOSPITAL_COMMUNITY): Payer: Medicare HMO

## 2020-10-08 DIAGNOSIS — H903 Sensorineural hearing loss, bilateral: Secondary | ICD-10-CM | POA: Diagnosis not present

## 2020-10-24 DIAGNOSIS — G894 Chronic pain syndrome: Secondary | ICD-10-CM | POA: Diagnosis not present

## 2020-11-20 DIAGNOSIS — E039 Hypothyroidism, unspecified: Secondary | ICD-10-CM | POA: Diagnosis not present

## 2020-11-26 DIAGNOSIS — H903 Sensorineural hearing loss, bilateral: Secondary | ICD-10-CM | POA: Diagnosis not present

## 2020-11-26 DIAGNOSIS — H938X3 Other specified disorders of ear, bilateral: Secondary | ICD-10-CM | POA: Diagnosis not present

## 2020-11-26 DIAGNOSIS — H9113 Presbycusis, bilateral: Secondary | ICD-10-CM | POA: Insufficient documentation

## 2020-11-26 DIAGNOSIS — Z974 Presence of external hearing-aid: Secondary | ICD-10-CM | POA: Diagnosis not present

## 2020-11-26 DIAGNOSIS — H6123 Impacted cerumen, bilateral: Secondary | ICD-10-CM | POA: Insufficient documentation

## 2020-11-28 DIAGNOSIS — M5417 Radiculopathy, lumbosacral region: Secondary | ICD-10-CM | POA: Diagnosis not present

## 2020-11-28 DIAGNOSIS — G894 Chronic pain syndrome: Secondary | ICD-10-CM | POA: Diagnosis not present

## 2020-11-28 DIAGNOSIS — J449 Chronic obstructive pulmonary disease, unspecified: Secondary | ICD-10-CM | POA: Diagnosis not present

## 2020-12-09 ENCOUNTER — Ambulatory Visit (INDEPENDENT_AMBULATORY_CARE_PROVIDER_SITE_OTHER): Payer: Medicare HMO | Admitting: Podiatry

## 2020-12-09 ENCOUNTER — Other Ambulatory Visit: Payer: Self-pay

## 2020-12-09 DIAGNOSIS — D689 Coagulation defect, unspecified: Secondary | ICD-10-CM

## 2020-12-09 DIAGNOSIS — L84 Corns and callosities: Secondary | ICD-10-CM

## 2020-12-09 DIAGNOSIS — B351 Tinea unguium: Secondary | ICD-10-CM | POA: Diagnosis not present

## 2020-12-16 ENCOUNTER — Telehealth: Payer: Self-pay | Admitting: Cardiology

## 2020-12-16 NOTE — Telephone Encounter (Signed)
Eliquis 2.5 mg Exp: Jan 2023, Lot # [id number].  Entresto 24/26 mg Exp: Jul 2024, Lot # [id number].   Samples placed at front desk for pick up with patient assistance forms.   Daughter notified and voiced understanding.

## 2020-12-16 NOTE — Telephone Encounter (Signed)
New message     Patient calling the office for samples of medication:   1.  What medication and dosage are you requesting samples for? Entresto and Eliquis   2.  Are you currently out of this medication? She has 2 left of both, it will be over $1000  for her to get them both, she needs help with financial assistance as well

## 2020-12-19 ENCOUNTER — Ambulatory Visit: Payer: Medicare HMO | Admitting: Podiatry

## 2020-12-26 DIAGNOSIS — G894 Chronic pain syndrome: Secondary | ICD-10-CM | POA: Diagnosis not present

## 2021-01-01 ENCOUNTER — Ambulatory Visit: Payer: Medicare HMO | Admitting: Cardiology

## 2021-01-02 ENCOUNTER — Ambulatory Visit: Payer: Medicare HMO | Admitting: Podiatry

## 2021-01-02 NOTE — Progress Notes (Signed)
  Subjective:  Patient ID: Debra [name], female    DOB: 05-17-1929,  MRN: [id number]  Chief Complaint  Patient presents with   Diabetes    A1C- 6.3    Foot Ulcer    Sore is still present on left hallux that is still bothersome.    87 y.o. female presents with the above complaint. History confirmed with patient.   Objective:  Physical Exam: warm, good capillary refill, no trophic changes or ulcerative lesions, normal DP and PT pulses and normal sensory exam. Nails thickened, dystrophic Left Foot: preulcerative HPK left hallux  Assessment:   1. Onychomycosis   2. Pre-ulcerative calluses   3. Coagulation defect [facility])     Plan:  Patient was evaluated and treated and all questions answered.  Onychomycosis, DM, PAD -Nails debrided x10 -No active ulceration noted.  Procedure: Nail Debridement Type of Debridement: manual, sharp debridement. Instrumentation: Nail nipper, rotary burr. Number of Nails: 10   Procedure: Paring of Lesion Rationale: painful hyperkeratotic lesion Type of Debridement: manual, sharp debridement. Instrumentation: 312 blade Number of Lesions: 1   No follow-ups on file.

## 2021-01-06 ENCOUNTER — Telehealth: Payer: Self-pay | Admitting: Cardiology

## 2021-01-06 ENCOUNTER — Other Ambulatory Visit: Payer: Self-pay

## 2021-01-06 ENCOUNTER — Ambulatory Visit: Payer: Medicare HMO | Admitting: Podiatry

## 2021-01-06 DIAGNOSIS — E1142 Type 2 diabetes mellitus with diabetic polyneuropathy: Secondary | ICD-10-CM | POA: Diagnosis not present

## 2021-01-06 DIAGNOSIS — M2041 Other hammer toe(s) (acquired), right foot: Secondary | ICD-10-CM

## 2021-01-06 DIAGNOSIS — M2042 Other hammer toe(s) (acquired), left foot: Secondary | ICD-10-CM

## 2021-01-06 DIAGNOSIS — M2032 Hallux varus (acquired), left foot: Secondary | ICD-10-CM

## 2021-01-06 NOTE — Progress Notes (Signed)
Patient in office today to pick-up diabetic shoes and was seen by EJ with [facility]. Patient tried the shoes on and was satisfied with the fit. Patient was advised of the break-in process at this time. Patient was advised to call the office with any questions, comments, or concerns. Patient verbalized understanding.  

## 2021-01-06 NOTE — Telephone Encounter (Signed)
Notified pt's daughter of B. Strader's note. She verbalized understanding.

## 2021-01-06 NOTE — Telephone Encounter (Signed)
Tracy Gibbs(Patient's Daughter) called to let Dr Domenic Polite know that Dr March Rummage at [facility] [phone] would be using numbing meds to cut sore on her left Big Toe, since she is a Diabetic. She will not be put to sleep. She would like call back to let her know Dr is OK w/minor procedure [phone].

## 2021-01-06 NOTE — Telephone Encounter (Signed)
    Covering for Dr. Domenic Polite - Numbing medications should be fine. If having to use general anesthesia (listed in the initial note that the patient would not be put to sleep) or needing to hold Eliquis, then would need to route through the Preoperative Pool.   Signed, Erma Heritage, PA-C 01/06/2021, 9:39 AM Pager: [phone]

## 2021-01-06 NOTE — Progress Notes (Signed)
  Subjective:  Patient ID: EMBRIE MIKKELSEN, female    DOB: Dec 20, 1928,  MRN: [id number]  Chief Complaint  Patient presents with   Ulcer    F/U Lt hallux ulcer check =-pt states," it hurts, it looks same." -no drainage -w/ redness and swelling tx: toe cap    87 y.o. female presents with the above complaint. History confirmed with patient.   Objective:  Physical Exam: warm, good capillary refill, no trophic changes or ulcerative lesions, normal DP and PT pulses and normal sensory exam. Nails thickened, dystrophic Left Foot: preulcerative HPK left hallux with hallux malleus deformity.  Assessment:   1. Hammertoe, bilateral   2. Type 2 diabetes mellitus with diabetic polyneuropathy, unspecified whether long term insulin use (HCC)   3. Hallux malleus, left      Plan:  Patient was evaluated and treated and all questions answered.  Hammertoe left hallux -Given persistent symptoms will proceed with surgical intervention as discussed. -Patient has failed all conservative therapy and wishes to proceed with surgical intervention. All risks, benefits, and alternatives discussed with patient. No guarantees given. Consent reviewed and signed by patient. -Planned procedures: left hallux flexor tenotomy, possible arthroplasty -ASA 3 - Patient with moderate systemic disease with functional limitations -Post-op anticoagulation: chemoprophylaxis not indicated   No follow-ups on file.

## 2021-01-08 ENCOUNTER — Telehealth: Payer: Self-pay | Admitting: Urology

## 2021-01-08 NOTE — Telephone Encounter (Signed)
DOS - 01/14/21  TENOTOMY 1ST LEFT --- 17915 HAMMERTOE 1ST LEFT --- [id number]   [employer] EFFECTIVE DATE - 06/04/18   PER [facility] [employer] SITE FOR CPT CODE 94801 NO PRIOR AUTH IS REQUIRED. FOR CPT CODE 65537 HAS BEEN APPROVED, AUTH # [id number].

## 2021-01-14 ENCOUNTER — Other Ambulatory Visit: Payer: Self-pay | Admitting: Podiatry

## 2021-01-14 ENCOUNTER — Encounter: Payer: Self-pay | Admitting: Podiatry

## 2021-01-14 DIAGNOSIS — Q6689 Other  specified congenital deformities of feet: Secondary | ICD-10-CM | POA: Diagnosis not present

## 2021-01-14 DIAGNOSIS — M2042 Other hammer toe(s) (acquired), left foot: Secondary | ICD-10-CM | POA: Diagnosis not present

## 2021-01-14 MED ORDER — CEPHALEXIN 500 MG PO CAPS: 500.0000 mg | ORAL_CAPSULE | Freq: Two times a day (BID) | ORAL | 0 refills | Status: DC

## 2021-01-14 MED ORDER — OXYCODONE-ACETAMINOPHEN 10-325 MG PO TABS: 1.0000 | ORAL_TABLET | ORAL | 0 refills | Status: DC | PRN

## 2021-01-14 NOTE — Progress Notes (Signed)
Rx sent to pharmacy for outpatient surgery. °

## 2021-01-15 ENCOUNTER — Encounter: Payer: Self-pay | Admitting: Student

## 2021-01-15 ENCOUNTER — Ambulatory Visit: Payer: Medicare HMO | Admitting: Student

## 2021-01-15 ENCOUNTER — Other Ambulatory Visit: Payer: Self-pay

## 2021-01-15 VITALS — BP 158/76 | HR 60 | Ht 67.0 in | Wt 114.0 lb

## 2021-01-15 DIAGNOSIS — I255 Ischemic cardiomyopathy: Secondary | ICD-10-CM

## 2021-01-15 DIAGNOSIS — I480 Paroxysmal atrial fibrillation: Secondary | ICD-10-CM | POA: Diagnosis not present

## 2021-01-15 DIAGNOSIS — Z79899 Other long term (current) drug therapy: Secondary | ICD-10-CM | POA: Diagnosis not present

## 2021-01-15 DIAGNOSIS — I10 Essential (primary) hypertension: Secondary | ICD-10-CM | POA: Diagnosis not present

## 2021-01-15 DIAGNOSIS — N1832 Chronic kidney disease, stage 3b: Secondary | ICD-10-CM | POA: Diagnosis not present

## 2021-01-15 DIAGNOSIS — I340 Nonrheumatic mitral (valve) insufficiency: Secondary | ICD-10-CM

## 2021-01-15 DIAGNOSIS — M069 Rheumatoid arthritis, unspecified: Secondary | ICD-10-CM | POA: Diagnosis not present

## 2021-01-15 DIAGNOSIS — I2510 Atherosclerotic heart disease of native coronary artery without angina pectoris: Secondary | ICD-10-CM | POA: Diagnosis not present

## 2021-01-15 MED ORDER — ENTRESTO 49-51 MG PO TABS: 1.0000 | ORAL_TABLET | Freq: Two times a day (BID) | ORAL | 11 refills | Status: DC

## 2021-01-15 NOTE — Progress Notes (Signed)
Cardiology Office Note    Date:  01/15/2021   ID:  Debra [name], DOB [birth year], MRN [id number]  PCP:  Sharilyn Sites, MD  Cardiologist: Rozann Lesches, MD    Chief Complaint  Patient presents with   Follow-up    3 month visit    History of Present Illness:    Debra [name] is a 87 y.o. female with past medical history of CAD (s/p CABG in 1997, cath in 12/2017 showing patent LIMA-LAD, occluded SVG-OM and no graft visualized to RCA but filled by collaterals, NSTEMI in 07/2019 with cath showing patent LIMA-LAD, known occlusion of SVG-OM, patent LCx stent and CTO of RCA with collaterals noted and no significant change with continued medical therapy recommended), HFrEF/ICM (EF 35-40% by echo in 07/2019), paroxysmal atrial fibrillation, mild to moderate MR,  HTN, HLD, Type 2 DM and COPD and who presents to the office today for 30-monthfollow-up.   She was last examined by Dr. MDomenic Politein 08/2020 and reported baseline dyspnea on exertion but denied any recent chest pain. She was continued on her current cardiac medications including Amiodarone, Eliquis, Atorvastatin, Plavix and Entresto. Was not on a BB due to prior bradycardia with this.   In talking with the patient and her daughter today, she reports overall doing well since her last office visit. She did undergo a procedure by podiatry yesterday for her hammertoe and reports some pain with this. She reports her breathing has overall been stable and denies any specific orthopnea, PND or pitting edema. No recent chest pain or palpitations.  They are concerned about the cost of her Eliquis and Entresto as they were going to cost her more than $1000 for the next month given that she is in the donut hole already. They request to be switched to alternatives if able as they were $150 before she was in the donut hole.    Past Medical History:  Diagnosis Date   Arthritis    CKD (chronic kidney disease) stage 4, GFR 15-29 ml/min (HCC)    Colon  cancer (HCC)    COPD (chronic obstructive pulmonary disease) ([facility]    Coronary artery disease    a. s/p CABG in 1997 (no detials provided) b. 12/2017: NSTEMI with cath showing patent LIMA-LAD with occluded SVG-OM and presumed occluded SVG-RCA but unable to be visualized, RCA was filled by collaterals. Medical management recommended. c.  07/2019: NSTEMI with rapid atrial fibrillation, stable coronary and bypass graft anatomy by cardiac catheterization   DDD (degenerative disc disease), lumbar    Diabetic neuropathy (HCC)    Essential hypertension    GERD (gastroesophageal reflux disease)    Gout    Hypothyroidism    Ischemic cardiomyopathy    Restless leg syndrome    Type 2 diabetes mellitus ([facility]     Past Surgical History:  Procedure Laterality Date   ABDOMINAL HYSTERECTOMY     CHOLECYSTECTOMY     COLON RESECTION     CORONARY ARTERY BYPASS GRAFT     ILIAC ARTERY STENT     LEFT HEART CATH AND CORS/GRAFTS ANGIOGRAPHY N/A 12/28/2017   Procedure: LEFT HEART CATH AND CORS/GRAFTS ANGIOGRAPHY;  Surgeon: JMartinique Peter M, MD;  [facility];  Service: Cardiovascular;  Laterality: N/A;   LEFT HEART CATH AND CORS/GRAFTS ANGIOGRAPHY N/A 08/01/2019   Procedure: LEFT HEART CATH AND CORS/GRAFTS ANGIOGRAPHY;  Surgeon: MBurnell Blanks MD;  [facility];  Service: Cardiovascular;  Laterality: N/A;   LUNG SURGERY  Left 2012   left lung resection   RENAL ARTERY STENT     THYROID SURGERY     ULTRASOUND GUIDANCE FOR VASCULAR ACCESS  12/28/2017   Procedure: Ultrasound Guidance For Vascular Access;  Surgeon: Martinique, Peter M, MD;  Location: [facility];  Service: Cardiovascular;;    Current Medications: Outpatient Medications Prior to Visit  Medication Sig Dispense Refill   ACCU-CHEK AVIVA PLUS test strip      Accu-Chek Softclix Lancets lancets      acetaminophen (TYLENOL) 325 MG tablet Take 650 mg by mouth every 6 (six) hours as needed for mild pain.      albuterol (VENTOLIN HFA) 108 (90 Base) MCG/ACT inhaler Inhale 1 puff into the lungs every 6 (six) hours as needed for shortness of breath or wheezing.     Alcohol Swabs (B-D SINGLE USE SWABS REGULAR) PADS      amiodarone (PACERONE) 100 MG tablet Take 50 mg by mouth daily. Twice a day     apixaban (ELIQUIS) 2.5 MG TABS tablet Take 1 tablet (2.5 mg total) by mouth 2 (two) times daily. 180 tablet 3   atorvastatin (LIPITOR) 80 MG tablet Take 1 tablet (80 mg total) by mouth daily at 6 PM. 30 tablet 6   BD PEN NEEDLE NANO 2ND GEN 32G X 4 MM MISC      Blood Glucose Monitoring Suppl (ACCU-CHEK GUIDE ME) w/Device KIT      cephALEXin (KEFLEX) 500 MG capsule Take 1 capsule (500 mg total) by mouth 2 (two) times daily. Take 1 tablet by mouth tonight, then one tomorrow AM and one in PM 6 capsule 0   Cholecalciferol (VITAMIN D) 125 MCG (5000 UT) CAPS Take 1 capsule by mouth daily.     diazepam (VALIUM) 2 MG tablet Take 2 mg by mouth daily as needed.     FLUoxetine (PROZAC) 20 MG capsule Take 1 capsule by mouth daily.     gabapentin (NEURONTIN) 100 MG capsule Take 100 mg by mouth 3 (three) times daily.      HYDROcodone-acetaminophen (NORCO) 10-325 MG tablet Take 1 tablet by mouth every 6 (six) hours as needed for moderate pain or severe pain. 1 tab po hs/ and prn.     levothyroxine (SYNTHROID) 150 MCG tablet      levothyroxine (SYNTHROID) 50 MCG tablet Take 1 tablet (50 mcg total) by mouth daily at 6 (six) AM. 30 tablet 2   Menthol, Topical Analgesic, (ASPERCREME MAX ROLL-ON) 16 % LIQD Apply 1 application topically as needed.     mirtazapine (REMERON) 7.5 MG tablet Take 7.5 mg by mouth at bedtime.     oxyCODONE-acetaminophen (PERCOCET) 10-325 MG tablet Take 1 tablet by mouth every 4 (four) hours as needed for pain. 20 tablet 0   pantoprazole (PROTONIX) 40 MG tablet Take 40 mg by mouth daily.      polyethylene glycol (MIRALAX / GLYCOLAX) 17 g packet Take 17 g by mouth daily as needed for mild constipation. 14 each  0   rOPINIRole (REQUIP) 2 MG tablet Take 2 mg by mouth at bedtime.      lisinopril (ZESTRIL) 40 MG tablet Take 1 tablet by mouth daily.     sacubitril-valsartan (ENTRESTO) 24-26 MG Take 1 tablet by mouth 2 (two) times daily. 180 tablet 3   amiodarone (PACERONE) 200 MG tablet      azithromycin (ZITHROMAX) 250 MG tablet  (Patient not taking: Reported on 01/15/2021)     clopidogrel (PLAVIX) 75 MG tablet Take 1  tablet by mouth daily. (Patient not taking: Reported on 01/15/2021)     doxycycline (VIBRA-TABS) 100 MG tablet  (Patient not taking: Reported on 01/15/2021)     insulin glargine (LANTUS) 100 unit/mL SOPN Inject 0.1 mLs (10 Units total) into the skin at bedtime. (Patient not taking: Reported on 01/15/2021)     mirtazapine (REMERON) 15 MG tablet      ranitidine (ZANTAC) 150 MG tablet Take 1 tablet by mouth 2 (two) times daily. (Patient not taking: Reported on 01/15/2021)     No facility-administered medications prior to visit.     Allergies:   Ciprofloxacin, Iodine, Zolpidem, and Iodinated diagnostic agents   Social History   Socioeconomic History   Marital status: Widowed    Spouse name: Not on file   Number of children: Not on file   Years of education: Not on file   Highest education level: Not on file  Occupational History   Not on file  Tobacco Use   Smoking status: Former    Types: Cigarettes    Quit date: 03/21/1996    Years since quitting: 24.8   Smokeless tobacco: Never  Vaping Use   Vaping Use: Never used  Substance and Sexual Activity   Alcohol use: No   Drug use: No   Sexual activity: Not on file  Other Topics Concern   Not on file  Social History Narrative   Not on file   Social Determinants of Health   Financial Resource Strain: Not on file  Food Insecurity: Not on file  Transportation Needs: Not on file  Physical Activity: Not on file  Stress: Not on file  Social Connections: Not on file     Family History:  The patient's family history includes Breast  cancer in her daughter; Colon cancer in her mother; Heart attack in her sister; Heart disease in her father.   Review of Systems:    Please see the history of present illness.     All other systems reviewed and are otherwise negative except as noted above.   Physical Exam:    VS:  BP (!) 158/76   Pulse 60   Ht 5' 7"  (1.702 m)   Wt 114 lb (51.7 kg)   SpO2 96%   BMI 17.85 kg/m    General: Pleasant elderly female appearing in no acute distress. Head: Normocephalic, atraumatic. Neck: No carotid bruits. JVD not elevated.  Lungs: Respirations regular and unlabored, without wheezes or rales.  Heart: Regular rate and rhythm. No S3 or S4.  2/6 SEM along RUSB.  Abdomen: Appears non-distended. No obvious abdominal masses. Msk:  Strength and tone appear normal for age. No obvious joint deformities or effusions. Extremities: No clubbing or cyanosis. No pitting edema.  Distal pedal pulses are 2+ bilaterally. Neuro: Alert and oriented X 3. Moves all extremities spontaneously. No focal deficits noted. Psych:  Responds to questions appropriately with a normal affect. Skin: No rashes or lesions noted  Wt Readings from Last 3 Encounters:  01/15/21 114 lb (51.7 kg)  08/26/20 116 lb (52.6 kg)  12/13/19 129 lb 12.8 oz (58.9 kg)     Studies/Labs Reviewed:   EKG:  EKG is ordered today.  The ekg ordered today demonstrates NSR, HR 61 with anterior infarct pattern and no acute ST abnormalities.   Recent Labs: 08/26/2020: BUN 22; Creatinine, Ser 1.31; Potassium 3.9; Sodium 139   Lipid Panel    Component Value Date/Time   CHOL 89 12/28/2017 0334   TRIG 73 12/28/2017 0334  HDL 36 (L) 12/28/2017 0334   CHOLHDL 2.5 12/28/2017 0334   VLDL 15 12/28/2017 0334   LDLCALC 38 12/28/2017 0334    Additional studies/ records that were reviewed today include:   Echocardiogram: 07/2019 IMPRESSIONS     1. Left ventricular ejection fraction, by visual estimation, is 35 to  40%. The left ventricle has  moderately decreased function. There is no  left ventricular hypertrophy.   2. Mild dyskinesis of the left ventricular, basal-mid inferoseptal wall  and inferior wall (consistent with previous infarction in the right  coronary artery distribution).   3. Severe hypokinesis of the left ventricular, basal anteroseptal wall.  The apical segments contract normally (consistent with occlusion of the  proximal LAD artery with patent graft to the distal vessel).   4. Abnormal septal motion consistent with post-operative status.   5. Elevated left atrial pressure.   6. Left ventricular diastolic parameters are consistent with Grade II  diastolic dysfunction (pseudonormalization).   7. The left ventricle demonstrates regional wall motion abnormalities.   8. Global right ventricle has normal systolic function.The right  ventricular size is normal. No increase in right ventricular wall  thickness.   9. Left atrial size was mildly dilated.  10. Right atrial size was normal.  11. Mild mitral annular calcification.  12. The mitral valve is normal in structure. Mild to moderate mitral valve  regurgitation.  13. The tricuspid valve is normal in structure.  14. The tricuspid valve is normal in structure. Tricuspid valve  regurgitation is not demonstrated.  15. The aortic valve is normal in structure. Aortic valve regurgitation is  not visualized. Mild aortic valve sclerosis without stenosis.  16. The pulmonic valve was grossly normal. Pulmonic valve regurgitation is  trivial.  17. TR signal is inadequate for assessing pulmonary artery systolic  pressure.  18. The inferior vena cava is dilated in size with <50% respiratory  variability, suggesting right atrial pressure of 15 mmHg.  19. No significant change from prior study.  20. Prior images reviewed side by side.   Cardiac Catheterization: 07/2019 Ost LAD to Prox LAD lesion is 100% stenosed. Ost 1st Mrg to 1st Mrg lesion is 40% stenosed. Ost 2nd  Mrg lesion is 30% stenosed. Ost 3rd Mrg lesion is 90% stenosed. Ost RCA to Dist RCA lesion is 100% stenosed. LIMA and is normal in caliber. The graft exhibits no disease. SVG. Origin to Prox Graft lesion is 100% stenosed. SVG due to known occlusion. Origin to Prox Graft lesion is 100% stenosed. Prox Cx to Mid Cx lesion is 10% stenosed.   1. Triple vessel CAD s/p CABG with 1 patent bypass graft 2. Occluded proximal LAD. Patent LIMA to mid LAD 3. Known occlusion of the SVG to OM. The mid Circumflex stent is patent. Severe disease in a small caliber OM branch that fills from left to left collaterals. Unchanged from last cath. 4. Chronic occlusion of the proximal RCA. Known occlusion of the vein graft to the distal RCA. The distal RCA fills from left to right collaterals.   Recommendations: Coronary disease unchanged from last cath in 2019. Continue medical management of CAD.  Assessment:    1. Coronary artery disease involving native coronary artery of native heart without angina pectoris   2. Ischemic cardiomyopathy   3. Paroxysmal atrial fibrillation (HCC)   4. Mitral valve insufficiency, unspecified etiology   5. Medication management   6. Essential hypertension   7. Stage 3b chronic kidney disease ([place])  Plan:   In order of problems listed above:  1. CAD - She is s/p CABG in 1997 with most recent cath in 07/2019 showing patent LIMA-LAD, known occlusion of SVG-OM, patent LCx stent and CTO of RCA with collaterals noted and no significant change since prior cath in 2019 with continued medical therapy recommended. - She remains on Plavix 75 mg daily and Atorvastatin 80 mg daily.  She is not on ASA given the need for anticoagulation and has not been on beta-blocker therapy given baseline bradycardia.  2. HFrEF/ICM - Her EF was at 35-40% by echo in 07/2019. A repeat echocardiogram was previously ordered but not yet performed. Will plan to titrate Entresto today to 49-35m BID to  help with her cardiomyopathy and also with elevated BP (BP could be elevated due to her recent surgery but had been elevated at home prior to this). She was provided with samples today along with patient assistance information. If not approved for patient assistance, would need to switch to Losartan. Could also add Spironolactone pending her BP response. Would not add an SGLT2 inhibitor at this time given her cost issues with Entresto and Eliquis.  - Would plan to obtain her follow-up echocardiogram once she has been on the higher dose of Entresto for a few months. She is not on a beta-blocker due to baseline bradycardia.  3. Paroxysmal Atrial Fibrillation - She denies any recent palpitations and is in normal sinus rhythm by examination and EKG today. Remains on Amiodarone 100 mg daily. - She is currently on Eliquis 2.5 mg twice daily but reports being unable to afford this. Samples were provided today. We reviewed options and she is open to starting Coumadin given this is more affordable and due to her poor insurance coverage of DOAC's prior to even entering the donut hole. Will refer to the Coumadin Clinic.  4. Mitral Regurgitation - She did have mild to moderate MR by echo in 07/2019. Would plan for a repeat echocardiogram later this year or early 2023.  5. HTN - BP is elevated at 158/76 during today's visit and has been elevated when checked at home for several weeks. Will titrate Entresto to 49-556mBID. Confirmed she is not on Lisinopril although this was on her list. Will repeat a BMET in 2 weeks for reassessment of electrolytes and kidney function.  6. Stage 3 CKD - Baseline creatinine 1.2 - 1.4. Stable at 1.31 in 08/2020. Repeat BMET in 2 weeks following dose adjustment of Entresto.    Medication Adjustments/Labs and Tests Ordered: Current medicines are reviewed at length with the patient today.  Concerns regarding medicines are outlined above.  Medication changes, Labs and Tests ordered  today are listed in the Patient Instructions below. Patient Instructions  Medication Instructions:   Your physician has recommended you make the following change in your medication:   Increase Entresto to 49/51 mg Two Times Daily   You have been given samples of Eliquis 2.5 mg Take 1 tablet two Times daily (Lot # [id number]: April 2023)  You have been given samples of Entresto 49/51 mg Take 1 Tablet Two Times Daily (Lot# [id number]: Dec 2023)   *If you need a refill on your cardiac medications before your next appointment, please call your pharmacy*   Lab Work: Your physician recommends that you return for lab work in: 2 Weeks ( CBO[place]onBMET)   If you have labs (blood work) drawn today and your tests are completely normal, you will receive your results  only by: [employer] (if you have MyChart) OR A paper copy in the mail If you have any lab test that is abnormal or we need to change your treatment, we will call you to review the results.   Testing/Procedures: NONE    Follow-Up: At [facility], you and your health needs are our priority.  As part of our continuing mission to provide you with exceptional heart care, we have created designated Provider Care Teams.  These Care Teams include your primary Cardiologist (physician) and Advanced Practice Providers (APPs -  Physician Assistants and Nurse Practitioners) who all work together to provide you with the care you need, when you need it.  We recommend signing up for the patient portal called "MyChart".  Sign up information is provided on this After Visit Summary.  MyChart is used to connect with patients for Virtual Visits (Telemedicine).  Patients are able to view lab/test results, encounter notes, upcoming appointments, etc.  Non-urgent messages can be sent to your provider as well.   To learn more about what you can do with MyChart, go to [email or web address].    Your next appointment:   3 -4 month(s)  The format  for your next appointment:   In Person  Provider:   Rozann Lesches, MD or Bernerd Pho, PA-C   Other Instructions Thank you for choosing [facility]!     Signed, Erma Heritage, PA-C  01/15/2021 5:43 PM    [facility] [address] [place], [place] [address] Phone: [phone] Fax: [phone]

## 2021-01-15 NOTE — Patient Instructions (Signed)
Medication Instructions:   Your physician has recommended you make the following change in your medication:   Increase Entresto to 49/51 mg Two Times Daily   You have been given samples of Eliquis 2.5 mg Take 1 tablet two Times daily (Lot # [id number], Exp: April 2023)  You have been given samples of Entresto 49/51 mg Take 1 Tablet Two Times Daily (Lot# [id number], Exp: Dec 2023)   *If you need a refill on your cardiac medications before your next appointment, please call your pharmacy*   Lab Work: Your physician recommends that you return for lab work in: 2 Weeks ( [employer], BMET)   If you have labs (blood work) drawn today and your tests are completely normal, you will receive your results only by: [employer] (if you have [place]) OR A paper copy in the mail If you have any lab test that is abnormal or we need to change your treatment, we will call you to review the results.   Testing/Procedures: NONE    Follow-Up: At [facility], you and your health needs are our priority.  As part of our continuing mission to provide you with exceptional heart care, we have created designated Provider Care Teams.  These Care Teams include your primary Cardiologist (physician) and Advanced Practice Providers (APPs -  Physician Assistants and Nurse Practitioners) who all work together to provide you with the care you need, when you need it.  We recommend signing up for the patient portal called "MyChart".  Sign up information is provided on this After Visit Summary.  MyChart is used to connect with patients for Virtual Visits (Telemedicine).  Patients are able to view lab/test results, encounter notes, upcoming appointments, etc.  Non-urgent messages can be sent to your provider as well.   To learn more about what you can do with MyChart, go to [email or web address].    Your next appointment:   3 -4 month(s)  The format for your next appointment:   In Person  Provider:   Rozann Lesches, MD or  Bernerd Pho, PA-C   Other Instructions Thank you for choosing [facility]!

## 2021-01-20 ENCOUNTER — Other Ambulatory Visit: Payer: Self-pay

## 2021-01-20 ENCOUNTER — Ambulatory Visit (INDEPENDENT_AMBULATORY_CARE_PROVIDER_SITE_OTHER): Payer: Medicare HMO | Admitting: Podiatry

## 2021-01-20 ENCOUNTER — Ambulatory Visit (INDEPENDENT_AMBULATORY_CARE_PROVIDER_SITE_OTHER): Payer: Medicare HMO

## 2021-01-20 DIAGNOSIS — Z9889 Other specified postprocedural states: Secondary | ICD-10-CM | POA: Diagnosis not present

## 2021-01-20 DIAGNOSIS — M2042 Other hammer toe(s) (acquired), left foot: Secondary | ICD-10-CM

## 2021-01-20 DIAGNOSIS — M2041 Other hammer toe(s) (acquired), right foot: Secondary | ICD-10-CM

## 2021-01-20 NOTE — Progress Notes (Signed)
  Subjective:  Patient ID: Debra [name], female    DOB: 18-Apr-1929,  MRN: [id number]  Chief Complaint  Patient presents with   Routine Post Op    POV #1 DOS 01/14/2021 -LT GREAT TOE FLEXOR TENOTOMY & MINIMALLY INVASICE ARHROPLASTY IF INDICATED. Pt states she has been doing well. No complaints, questions or concerns. No NV, fever or chills.     DOS: 01/14/21 Procedure: Left great toe arthroplasty, flexor tenotomy  87 y.o. female presents with the above complaint. History confirmed with patient.   Objective:  Physical Exam: tenderness at the surgical site, local edema noted, and calf supple, nontender. Incision: healing well, no significant drainage, no dehiscence, no significant erythema  No images are attached to the encounter.  Radiographs: X-ray of the left foot: consistent with post-op state, with good alignment; successful arthroplasty  Assessment:   1. Post-operative state   2. Hammertoe, bilateral     Plan:  Patient was evaluated and treated and all questions answered.  Post-operative State -XR reviewed with patient -Sutures removed -Ok to start showering at this time. Advised they cannot soak. -WBAT in Surgical shoe -XRs needed at follow-up: none   No follow-ups on file.

## 2021-01-26 ENCOUNTER — Other Ambulatory Visit: Payer: Self-pay

## 2021-01-26 ENCOUNTER — Ambulatory Visit (INDEPENDENT_AMBULATORY_CARE_PROVIDER_SITE_OTHER): Payer: Medicare HMO | Admitting: *Deleted

## 2021-01-26 DIAGNOSIS — Z5181 Encounter for therapeutic drug level monitoring: Secondary | ICD-10-CM | POA: Diagnosis not present

## 2021-01-26 DIAGNOSIS — I480 Paroxysmal atrial fibrillation: Secondary | ICD-10-CM | POA: Diagnosis not present

## 2021-01-26 LAB — POCT INR: INR: 1.2 — AB (ref 2.0–3.0)

## 2021-01-26 MED ORDER — WARFARIN SODIUM 2.5 MG PO TABS: 2.5000 mg | ORAL_TABLET | Freq: Every day | ORAL | 3 refills | Status: DC

## 2021-01-26 NOTE — Patient Instructions (Signed)
Here to change from Eliquis to Warfarin due to cost. Will overlap Eliquis and warfarin 2.5mg  daily x 3 days starting tomorrow night (Tuesday).  Take last dose of Eliquis Thursday night then continue warfarin 2.5mg  daily Recheck INR 02/02/21 in [place] office.

## 2021-02-02 ENCOUNTER — Ambulatory Visit (INDEPENDENT_AMBULATORY_CARE_PROVIDER_SITE_OTHER): Payer: Medicare HMO | Admitting: *Deleted

## 2021-02-02 ENCOUNTER — Other Ambulatory Visit: Payer: Self-pay

## 2021-02-02 ENCOUNTER — Other Ambulatory Visit (HOSPITAL_COMMUNITY)
Admission: RE | Admit: 2021-02-02 | Discharge: 2021-02-02 | Disposition: A | Discharge status: Home / Self Care | Payer: Medicare HMO | Source: Ambulatory Visit | Attending: Student | Admitting: Student

## 2021-02-02 DIAGNOSIS — I480 Paroxysmal atrial fibrillation: Secondary | ICD-10-CM

## 2021-02-02 DIAGNOSIS — Z79899 Other long term (current) drug therapy: Secondary | ICD-10-CM | POA: Insufficient documentation

## 2021-02-02 DIAGNOSIS — Z5181 Encounter for therapeutic drug level monitoring: Secondary | ICD-10-CM

## 2021-02-02 LAB — POCT INR: INR: 2.2 (ref 2.0–3.0)

## 2021-02-02 LAB — CBC
HCT: 39.1 % (ref 36.0–46.0)
Hemoglobin: 12.1 g/dL (ref 12.0–15.0)
MCH: 32.8 pg (ref 26.0–34.0)
MCHC: 30.9 g/dL (ref 30.0–36.0)
MCV: 106 fL — ABNORMAL HIGH (ref 80.0–100.0)
Platelets: 258 10*3/uL (ref 150–400)
RBC: 3.69 MIL/uL — ABNORMAL LOW (ref 3.87–5.11)
RDW: 14.9 % (ref 11.5–15.5)
WBC: 4.7 10*3/uL (ref 4.0–10.5)
nRBC: 0 % (ref 0.0–0.2)

## 2021-02-02 LAB — BASIC METABOLIC PANEL
Anion gap: 6 (ref 5–15)
BUN: 27 mg/dL — ABNORMAL HIGH (ref 8–23)
CO2: 26 mmol/L (ref 22–32)
Calcium: 8.8 mg/dL — ABNORMAL LOW (ref 8.9–10.3)
Chloride: 106 mmol/L (ref 98–111)
Creatinine, Ser: 1.5 mg/dL — ABNORMAL HIGH (ref 0.44–1.00)
GFR, Estimated: 33 mL/min — ABNORMAL LOW (ref >60)
Glucose, Bld: 123 mg/dL — ABNORMAL HIGH (ref 70–99)
Potassium: 4.3 mmol/L (ref 3.5–5.1)
Sodium: 138 mmol/L (ref 135–145)

## 2021-02-02 NOTE — Patient Instructions (Signed)
Description   Continue to take Warfarin 1 tablet daily. Recheck INR in 1 week.

## 2021-02-03 ENCOUNTER — Telehealth: Payer: Self-pay | Admitting: *Deleted

## 2021-02-03 ENCOUNTER — Ambulatory Visit (INDEPENDENT_AMBULATORY_CARE_PROVIDER_SITE_OTHER): Payer: Medicare HMO | Admitting: Podiatry

## 2021-02-03 DIAGNOSIS — M2041 Other hammer toe(s) (acquired), right foot: Secondary | ICD-10-CM

## 2021-02-03 DIAGNOSIS — Z9889 Other specified postprocedural states: Secondary | ICD-10-CM

## 2021-02-03 DIAGNOSIS — M2042 Other hammer toe(s) (acquired), left foot: Secondary | ICD-10-CM

## 2021-02-03 DIAGNOSIS — Z79899 Other long term (current) drug therapy: Secondary | ICD-10-CM

## 2021-02-03 NOTE — Telephone Encounter (Signed)
Daughter notified 

## 2021-02-03 NOTE — Progress Notes (Signed)
  Subjective:  Patient ID: Debra [name], female    DOB: [birth year],  MRN: [id number]  Chief Complaint  Patient presents with   Routine Post Op    POV #2 DOS 01/14/2021 -LT GREAT TOE FLEXOR TENOTOMY & MINIMALLY INVASICE ARHROPLASTY IF INDICATED. Pt states she has been doing well. No questions or concerns.     DOS: 01/14/21 Procedure: Left great toe arthroplasty, flexor tenotomy  87 y.o. female presents with the above complaint. History confirmed with patient.   Objective:  Physical Exam: tenderness at the surgical site, local edema noted, and calf supple, nontender. Incision: healed  Assessment:   1. Post-operative state   2. Hammertoe, bilateral    Plan:  Patient was evaluated and treated and all questions answered.  Post-operative State -Incisions well healed -Ok to slowly transition to normal shoegear at this time. -Minimal debridement of distal callus today. Non-procedural  Return in about 6 weeks (around 03/17/2021) for Post-Op (No XRs).

## 2021-02-03 NOTE — Telephone Encounter (Signed)
-----   Message from Erma Heritage, [place] sent at 02/02/2021 11:19 AM EDT ----- Please let the patient know her hemoglobin and platelets are within a normal range. Electrolytes are within a normal range. Kidney function is abnormal but overly consistent with prior values within the past year as her creatinine is at 1.50 and has been between 1.3 - 1.7. Would recommend a repeat BMET again in 4-5 weeks to make sure this remains consistent following recent dose adjustment of Entresto.

## 2021-02-09 ENCOUNTER — Ambulatory Visit (INDEPENDENT_AMBULATORY_CARE_PROVIDER_SITE_OTHER): Payer: Medicare HMO | Admitting: *Deleted

## 2021-02-09 DIAGNOSIS — Z5181 Encounter for therapeutic drug level monitoring: Secondary | ICD-10-CM

## 2021-02-09 DIAGNOSIS — I480 Paroxysmal atrial fibrillation: Secondary | ICD-10-CM

## 2021-02-09 LAB — POCT INR: INR: 3.1 — AB (ref 2.0–3.0)

## 2021-02-09 NOTE — Patient Instructions (Signed)
Decrease warfarin to 1 tablet daily except 1/2 tablet on Mondays. Recheck INR in 10 days

## 2021-02-11 DIAGNOSIS — E119 Type 2 diabetes mellitus without complications: Secondary | ICD-10-CM | POA: Diagnosis not present

## 2021-02-11 DIAGNOSIS — I10 Essential (primary) hypertension: Secondary | ICD-10-CM | POA: Diagnosis not present

## 2021-02-11 DIAGNOSIS — I2510 Atherosclerotic heart disease of native coronary artery without angina pectoris: Secondary | ICD-10-CM | POA: Diagnosis not present

## 2021-02-11 DIAGNOSIS — M5136 Other intervertebral disc degeneration, lumbar region: Secondary | ICD-10-CM | POA: Diagnosis not present

## 2021-02-11 DIAGNOSIS — Z681 Body mass index (BMI) 19 or less, adult: Secondary | ICD-10-CM | POA: Diagnosis not present

## 2021-02-11 DIAGNOSIS — J449 Chronic obstructive pulmonary disease, unspecified: Secondary | ICD-10-CM | POA: Diagnosis not present

## 2021-02-11 DIAGNOSIS — G894 Chronic pain syndrome: Secondary | ICD-10-CM | POA: Diagnosis not present

## 2021-02-11 DIAGNOSIS — E782 Mixed hyperlipidemia: Secondary | ICD-10-CM | POA: Diagnosis not present

## 2021-02-11 DIAGNOSIS — E039 Hypothyroidism, unspecified: Secondary | ICD-10-CM | POA: Diagnosis not present

## 2021-02-18 ENCOUNTER — Ambulatory Visit (INDEPENDENT_AMBULATORY_CARE_PROVIDER_SITE_OTHER): Payer: Medicare HMO | Admitting: *Deleted

## 2021-02-18 ENCOUNTER — Other Ambulatory Visit: Payer: Self-pay

## 2021-02-18 ENCOUNTER — Telehealth: Payer: Self-pay | Admitting: Cardiology

## 2021-02-18 DIAGNOSIS — I480 Paroxysmal atrial fibrillation: Secondary | ICD-10-CM

## 2021-02-18 DIAGNOSIS — Z5181 Encounter for therapeutic drug level monitoring: Secondary | ICD-10-CM | POA: Diagnosis not present

## 2021-02-18 LAB — POCT INR: INR: 3.6 — AB (ref 2.0–3.0)

## 2021-02-18 NOTE — Patient Instructions (Signed)
Hold warfarin tonight then decrease dose to 1 tablet daily except 1/2 tablet on Mondays, Wednesdays and Fridays.  Recheck INR in 2 wks

## 2021-02-18 NOTE — Telephone Encounter (Signed)
Patient is requesting samples of Entresto 49-51

## 2021-02-18 NOTE — Telephone Encounter (Signed)
Pt here for coumadin check. Pt requested samples of Entresto 49/51 mg tablets.   Entresto Sample:  Lot #: [id number] Exp: 04/24  Daughter was with pt and stated that it's over $1000.00 for pt's Entresto each month and she cannot afford it. Pt's daughter is requesting provider consider changing medication to something that's more affordable.   Please advise.

## 2021-02-18 NOTE — Telephone Encounter (Signed)
Mailed pt a PAF application for [employer]

## 2021-02-23 DIAGNOSIS — H5200 Hypermetropia, unspecified eye: Secondary | ICD-10-CM | POA: Diagnosis not present

## 2021-03-03 ENCOUNTER — Ambulatory Visit (INDEPENDENT_AMBULATORY_CARE_PROVIDER_SITE_OTHER): Payer: Medicare HMO | Admitting: *Deleted

## 2021-03-03 ENCOUNTER — Other Ambulatory Visit: Payer: Self-pay

## 2021-03-03 DIAGNOSIS — I480 Paroxysmal atrial fibrillation: Secondary | ICD-10-CM | POA: Diagnosis not present

## 2021-03-03 DIAGNOSIS — Z5181 Encounter for therapeutic drug level monitoring: Secondary | ICD-10-CM

## 2021-03-03 LAB — POCT INR: INR: 1.7 — AB (ref 2.0–3.0)

## 2021-03-03 NOTE — Patient Instructions (Signed)
Take warfarin 1 1/2 tablets tonight then increase dose to 1 tablet daily except 1/2 tablet on Mondays and Fridays.  Recheck INR in 2 wks

## 2021-03-11 ENCOUNTER — Telehealth: Payer: Self-pay | Admitting: Student

## 2021-03-11 DIAGNOSIS — M48061 Spinal stenosis, lumbar region without neurogenic claudication: Secondary | ICD-10-CM | POA: Diagnosis not present

## 2021-03-11 DIAGNOSIS — Z23 Encounter for immunization: Secondary | ICD-10-CM | POA: Diagnosis not present

## 2021-03-11 DIAGNOSIS — H9193 Unspecified hearing loss, bilateral: Secondary | ICD-10-CM | POA: Diagnosis not present

## 2021-03-11 DIAGNOSIS — G894 Chronic pain syndrome: Secondary | ICD-10-CM | POA: Diagnosis not present

## 2021-03-11 DIAGNOSIS — Z681 Body mass index (BMI) 19 or less, adult: Secondary | ICD-10-CM | POA: Diagnosis not present

## 2021-03-11 MED ORDER — ENTRESTO 49-51 MG PO TABS: 1.0000 | ORAL_TABLET | Freq: Two times a day (BID) | ORAL | 11 refills | Status: DC

## 2021-03-11 NOTE — Telephone Encounter (Signed)
Medication refill request for Entresto 49-51 mg tablets approved and sent to [place] per pt request.

## 2021-03-11 NOTE — Telephone Encounter (Signed)
New message    New prescription needs sent in     *STAT* If patient is at the pharmacy, call can be transferred to refill team.   1. Which medications need to be refilled? (please list name of each medication and dose if known) sacubitril-valsartan (ENTRESTO) 49-51 MG  2. Which pharmacy/location (including street and city if local pharmacy) is medication to be sent to? [facility] on [address]   3. Do they need a 30 day or 90 day supply 90

## 2021-03-17 ENCOUNTER — Ambulatory Visit (INDEPENDENT_AMBULATORY_CARE_PROVIDER_SITE_OTHER): Payer: Medicare HMO | Admitting: Podiatry

## 2021-03-17 ENCOUNTER — Other Ambulatory Visit: Payer: Self-pay

## 2021-03-17 DIAGNOSIS — E1142 Type 2 diabetes mellitus with diabetic polyneuropathy: Secondary | ICD-10-CM | POA: Diagnosis not present

## 2021-03-17 DIAGNOSIS — M2042 Other hammer toe(s) (acquired), left foot: Secondary | ICD-10-CM

## 2021-03-17 DIAGNOSIS — M2041 Other hammer toe(s) (acquired), right foot: Secondary | ICD-10-CM

## 2021-03-17 DIAGNOSIS — B351 Tinea unguium: Secondary | ICD-10-CM

## 2021-03-17 DIAGNOSIS — Z9889 Other specified postprocedural states: Secondary | ICD-10-CM

## 2021-03-17 NOTE — Progress Notes (Signed)
  Subjective:  Patient ID: Debra [name], female    DOB: September 28, 1928,  MRN: [id number]  Chief Complaint  Patient presents with   Routine Post Op     Return in about 6 weeks (around 03/17/2021) for Post-Op (No XRs).POV #3 DOS 01/14/2021 -LT GREAT TOE FLEXOR TENOTOMY & MINIMALLY INVASICE ARHROPLASTY IF INDICATED   DOS: 01/14/21 Procedure: Left great toe arthroplasty, flexor tenotomy  87 y.o. female presents with the above complaint. History confirmed with patient.   Objective:  Physical Exam: tenderness at the surgical site, local edema noted, and calf supple, nontender.  Onychomycosis of the toenails noted with pain to palpation Incision: healed  Assessment:   1. Post-operative state   2. Hammertoe, bilateral   3. Type 2 diabetes mellitus with diabetic polyneuropathy, unspecified whether long term insulin use ([place])   4. Onychomycosis    Plan:  Patient was evaluated and treated and all questions answered.  Post-operative State -Callused area remains well-healed with good alignment of the left great toe.  Patient pleased with the procedure.  Onychomycosis with DM, coagulation defect -Nails debrided x10  Procedure: Nail Debridement Type of Debridement: manual, sharp debridement. Instrumentation: Nail nipper, rotary burr. Number of Nails: 10  No follow-ups on file.

## 2021-03-19 ENCOUNTER — Emergency Department (HOSPITAL_COMMUNITY): Payer: Medicare HMO

## 2021-03-19 ENCOUNTER — Encounter (HOSPITAL_COMMUNITY): Payer: Self-pay

## 2021-03-19 ENCOUNTER — Other Ambulatory Visit: Payer: Self-pay

## 2021-03-19 ENCOUNTER — Emergency Department (HOSPITAL_COMMUNITY)
Admission: EM | Admit: 2021-03-19 | Discharge: 2021-03-19 | Disposition: A | Discharge status: Home / Self Care | Payer: Medicare HMO | Attending: Emergency Medicine | Admitting: Emergency Medicine

## 2021-03-19 DIAGNOSIS — I10 Essential (primary) hypertension: Secondary | ICD-10-CM | POA: Diagnosis not present

## 2021-03-19 DIAGNOSIS — N1832 Chronic kidney disease, stage 3b: Secondary | ICD-10-CM

## 2021-03-19 DIAGNOSIS — N184 Chronic kidney disease, stage 4 (severe): Secondary | ICD-10-CM | POA: Diagnosis not present

## 2021-03-19 DIAGNOSIS — Z7901 Long term (current) use of anticoagulants: Secondary | ICD-10-CM | POA: Insufficient documentation

## 2021-03-19 DIAGNOSIS — Z20822 Contact with and (suspected) exposure to covid-19: Secondary | ICD-10-CM | POA: Diagnosis not present

## 2021-03-19 DIAGNOSIS — Z951 Presence of aortocoronary bypass graft: Secondary | ICD-10-CM | POA: Diagnosis not present

## 2021-03-19 DIAGNOSIS — E1122 Type 2 diabetes mellitus with diabetic chronic kidney disease: Secondary | ICD-10-CM | POA: Insufficient documentation

## 2021-03-19 DIAGNOSIS — I2510 Atherosclerotic heart disease of native coronary artery without angina pectoris: Secondary | ICD-10-CM | POA: Diagnosis not present

## 2021-03-19 DIAGNOSIS — R0602 Shortness of breath: Secondary | ICD-10-CM | POA: Diagnosis not present

## 2021-03-19 DIAGNOSIS — J90 Pleural effusion, not elsewhere classified: Secondary | ICD-10-CM | POA: Insufficient documentation

## 2021-03-19 DIAGNOSIS — J441 Chronic obstructive pulmonary disease with (acute) exacerbation: Secondary | ICD-10-CM | POA: Diagnosis not present

## 2021-03-19 DIAGNOSIS — Z79899 Other long term (current) drug therapy: Secondary | ICD-10-CM | POA: Insufficient documentation

## 2021-03-19 DIAGNOSIS — I130 Hypertensive heart and chronic kidney disease with heart failure and stage 1 through stage 4 chronic kidney disease, or unspecified chronic kidney disease: Secondary | ICD-10-CM | POA: Insufficient documentation

## 2021-03-19 DIAGNOSIS — E039 Hypothyroidism, unspecified: Secondary | ICD-10-CM | POA: Insufficient documentation

## 2021-03-19 DIAGNOSIS — E1140 Type 2 diabetes mellitus with diabetic neuropathy, unspecified: Secondary | ICD-10-CM | POA: Insufficient documentation

## 2021-03-19 DIAGNOSIS — Z85038 Personal history of other malignant neoplasm of large intestine: Secondary | ICD-10-CM | POA: Insufficient documentation

## 2021-03-19 DIAGNOSIS — I5023 Acute on chronic systolic (congestive) heart failure: Secondary | ICD-10-CM | POA: Insufficient documentation

## 2021-03-19 DIAGNOSIS — Z87891 Personal history of nicotine dependence: Secondary | ICD-10-CM | POA: Diagnosis not present

## 2021-03-19 DIAGNOSIS — I4891 Unspecified atrial fibrillation: Secondary | ICD-10-CM | POA: Diagnosis not present

## 2021-03-19 LAB — BASIC METABOLIC PANEL
Anion gap: 7 (ref 5–15)
BUN: 21 mg/dL (ref 8–23)
CO2: 26 mmol/L (ref 22–32)
Calcium: 8.7 mg/dL — ABNORMAL LOW (ref 8.9–10.3)
Chloride: 106 mmol/L (ref 98–111)
Creatinine, Ser: 1.55 mg/dL — ABNORMAL HIGH (ref 0.44–1.00)
GFR, Estimated: 32 mL/min — ABNORMAL LOW (ref >60)
Glucose, Bld: 124 mg/dL — ABNORMAL HIGH (ref 70–99)
Potassium: 4 mmol/L (ref 3.5–5.1)
Sodium: 139 mmol/L (ref 135–145)

## 2021-03-19 LAB — CBC WITH DIFFERENTIAL/PLATELET
Abs Immature Granulocytes: 0.01 10*3/uL (ref 0.00–0.07)
Basophils Absolute: 0 10*3/uL (ref 0.0–0.1)
Basophils Relative: 1 %
Eosinophils Absolute: 0.2 10*3/uL (ref 0.0–0.5)
Eosinophils Relative: 5 %
HCT: 36.5 % (ref 36.0–46.0)
Hemoglobin: 11.2 g/dL — ABNORMAL LOW (ref 12.0–15.0)
Immature Granulocytes: 0 %
Lymphocytes Relative: 27 %
Lymphs Abs: 1.1 10*3/uL (ref 0.7–4.0)
MCH: 31.5 pg (ref 26.0–34.0)
MCHC: 30.7 g/dL (ref 30.0–36.0)
MCV: 102.8 fL — ABNORMAL HIGH (ref 80.0–100.0)
Monocytes Absolute: 0.4 10*3/uL (ref 0.1–1.0)
Monocytes Relative: 9 %
Neutro Abs: 2.3 10*3/uL (ref 1.7–7.7)
Neutrophils Relative %: 58 %
Platelets: 222 10*3/uL (ref 150–400)
RBC: 3.55 MIL/uL — ABNORMAL LOW (ref 3.87–5.11)
RDW: 14.8 % (ref 11.5–15.5)
WBC: 4 10*3/uL (ref 4.0–10.5)
nRBC: 0 % (ref 0.0–0.2)

## 2021-03-19 LAB — RESP PANEL BY RT-PCR (FLU A&B, COVID) ARPGX2
Influenza A by PCR: NEGATIVE
Influenza B by PCR: NEGATIVE
SARS Coronavirus 2 by RT PCR: NEGATIVE

## 2021-03-19 LAB — TROPONIN I (HIGH SENSITIVITY)
Troponin I (High Sensitivity): 17 ng/L (ref <18)
Troponin I (High Sensitivity): 19 ng/L — ABNORMAL HIGH (ref <18)

## 2021-03-19 LAB — BRAIN NATRIURETIC PEPTIDE: B Natriuretic Peptide: 2527 pg/mL — ABNORMAL HIGH (ref 0.0–100.0)

## 2021-03-19 MED ORDER — FUROSEMIDE 20 MG PO TABS: 20.0000 mg | ORAL_TABLET | Freq: Every day | ORAL | 0 refills | Status: DC

## 2021-03-19 MED ORDER — ACETAMINOPHEN 325 MG PO TABS
650.0000 mg | ORAL_TABLET | Freq: Once | ORAL | Status: AC
  Administered 2021-03-19: 650 mg via ORAL
  Filled 2021-03-19: qty 2

## 2021-03-19 MED ORDER — METHYLPREDNISOLONE SODIUM SUCC 125 MG IJ SOLR
125.0000 mg | Freq: Once | INTRAMUSCULAR | Status: AC
  Administered 2021-03-19: 125 mg via INTRAVENOUS
  Filled 2021-03-19: qty 2

## 2021-03-19 MED ORDER — PREDNISONE 10 MG PO TABS: 20.0000 mg | ORAL_TABLET | Freq: Every day | ORAL | 0 refills | Status: DC

## 2021-03-19 MED ORDER — IPRATROPIUM-ALBUTEROL 0.5-2.5 (3) MG/3ML IN SOLN
3.0000 mL | Freq: Once | RESPIRATORY_TRACT | Status: AC
  Administered 2021-03-19: 3 mL via RESPIRATORY_TRACT
  Filled 2021-03-19: qty 3

## 2021-03-19 MED ORDER — FUROSEMIDE 10 MG/ML IJ SOLN
20.0000 mg | Freq: Once | INTRAMUSCULAR | Status: AC
  Administered 2021-03-19: 20 mg via INTRAVENOUS
  Filled 2021-03-19: qty 2

## 2021-03-19 NOTE — ED Triage Notes (Signed)
Pt to er room number one via ems, per ems pt is here for sob, pt states that her shortness of breath started on Sunday, but then she started feeling better so she didn't come to the hospital.  Pt states that when she woke up this am her shortness of breath came back, pt talking in full sentences, resps even and unlabored, pt on O2 sat monitor, satting 96%

## 2021-03-19 NOTE — ED Notes (Signed)
Pt assisted to bedside commode

## 2021-03-19 NOTE — ED Provider Notes (Signed)
[facility] EMERGENCY DEPARTMENT Provider Note   CSN: [id number] Arrival date & time: 03/19/21  0844     History Chief Complaint  Patient presents with   Shortness of Breath    Debra [name] is a 87 y.o. female.  Pt presents to the ED today with sob.  Pt said she had some sob on Sunday, 9/11, but it got better.  This morning, she got up to go to the bathroom and she became very sob walking back from the bathroom.  She does have a hx of COPD and is a former smoker.  She has not had a fever.  She has not been around anyone who has been sick.  She has been fully vaccinated.  She denies cp or n/v.      Past Medical History:  Diagnosis Date   Arthritis    CKD (chronic kidney disease) stage 4, GFR 15-29 ml/min (HCC)    Colon cancer (HCC)    COPD (chronic obstructive pulmonary disease) (HCC)    Coronary artery disease    a. s/p CABG in 1997 (no detials provided) b. 12/2017: NSTEMI with cath showing patent LIMA-LAD with occluded SVG-OM and presumed occluded SVG-RCA but unable to be visualized, RCA was filled by collaterals. Medical management recommended. c.  07/2019: NSTEMI with rapid atrial fibrillation, stable coronary and bypass graft anatomy by cardiac catheterization   DDD (degenerative disc disease), lumbar    Diabetic neuropathy ([place])    Essential hypertension    GERD (gastroesophageal reflux disease)    Gout    Hypothyroidism    Ischemic cardiomyopathy    Restless leg syndrome    Type 2 diabetes mellitus [facility])     Patient Active Problem List   Diagnosis Date Noted   Encounter for therapeutic drug monitoring 01/26/2021   Excessive cerumen in both ear canals 11/26/2020   Presbycusis of both ears 11/26/2020   Hypotension    Cellulitis 02/22/2019   Adjustment disorder with mixed anxiety and depressed mood 12/21/2018   Benign essential HTN 02/07/2018   Dizziness 02/07/2018   Elevated troponin 02/07/2018   Acute on chronic systolic CHF (congestive heart failure) ([place])  12/30/2017   Atrial fibrillation with RVR ([place]) 12/30/2017   Non-ST elevation (NSTEMI) myocardial infarction ([place]) 12/26/2017   Palpitations 10/03/2017   Allergic rhinitis 09/20/2017   Asthma 09/20/2017   Carpal tunnel syndrome 09/20/2017   Diabetes with neurologic complications ([place]) 36/64/4034   Generalized osteoarthritis of hand 09/20/2017   GERD (gastroesophageal reflux disease) 09/20/2017   Hiatal hernia 09/20/2017   Positive ANA (antinuclear antibody) 09/20/2017   Restless leg 09/20/2017   Sciatica 09/20/2017   General weakness 05/07/2017   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   UTI (urinary tract infection) 03/20/2017   Hypertension 03/20/2017   AKI (acute kidney injury) ([place]) 03/20/2017   Chronic renal insufficiency, stage 3 (moderate) ([place]) 03/20/2017   Rheumatoid arthritis ([place]) 03/20/2017   Type 2 diabetes mellitus ([place]) 03/20/2017   COPD (chronic obstructive pulmonary disease) ([place]) 06/03/2016   Anxiety 05/09/2012   Colon cancer ([place]) 03/01/2011   CAD (coronary artery disease) 07/25/2010   Hyperlipidemia, unspecified 07/25/2010   Hypothyroidism 07/25/2010   PAD (peripheral artery disease) ([place]) 07/25/2010    Past Surgical History:  Procedure Laterality Date   ABDOMINAL HYSTERECTOMY     CHOLECYSTECTOMY     COLON RESECTION     CORONARY ARTERY BYPASS GRAFT     ILIAC ARTERY STENT     LEFT HEART CATH AND CORS/GRAFTS ANGIOGRAPHY N/A 12/28/2017  Procedure: LEFT HEART CATH AND CORS/GRAFTS ANGIOGRAPHY;  Surgeon: Martinique, Peter M, MD;  Location: [place] [facility];  Service: Cardiovascular;  Laterality: N/A;   LEFT HEART CATH AND CORS/GRAFTS ANGIOGRAPHY N/A 08/01/2019   Procedure: LEFT HEART CATH AND CORS/GRAFTS ANGIOGRAPHY;  Surgeon: Burnell Blanks, MD;  Location: [place] [facility];  Service: Cardiovascular;  Laterality: N/A;   LUNG SURGERY Left 2012   left lung resection   RENAL ARTERY STENT     THYROID SURGERY     ULTRASOUND GUIDANCE FOR VASCULAR ACCESS   12/28/2017   Procedure: Ultrasound Guidance For Vascular Access;  Surgeon: Martinique, Peter M, MD;  Location: [place] [facility];  Service: Cardiovascular;;     OB History     Gravida  4   Para  4   Term  4   Preterm      AB      Living         SAB      IAB      Ectopic      Multiple      Live Births              Family History  Problem Relation Age of Onset   Colon cancer Mother    Heart disease Father    Heart attack Sister    Breast cancer Daughter     Social History   Tobacco Use   Smoking status: Former    Types: Cigarettes    Quit date: 03/21/1996    Years since quitting: 25.0   Smokeless tobacco: Never  Vaping Use   Vaping Use: Never used  Substance Use Topics   Alcohol use: No   Drug use: No    Home Medications Prior to Admission medications   Medication Sig Start Date End Date Taking? Authorizing Provider  furosemide (LASIX) 20 MG tablet Take 1 tablet (20 mg total) by mouth daily. 03/19/21  Yes Isla Pence, MD  predniSONE (DELTASONE) 10 MG tablet Take 2 tablets (20 mg total) by mouth daily. 03/19/21  Yes Isla Pence, MD  ACCU-CHEK AVIVA PLUS test strip  04/17/20   [provider]  Accu-Chek Softclix Lancets lancets  04/17/20   [provider]  acetaminophen (TYLENOL) 325 MG tablet Take 650 mg by mouth every 6 (six) hours as needed for mild pain.    [provider]  albuterol (VENTOLIN HFA) 108 (90 Base) MCG/ACT inhaler Inhale 1 puff into the lungs every 6 (six) hours as needed for shortness of breath or wheezing.    [provider]  Alcohol Swabs (B-D SINGLE USE SWABS REGULAR) PADS  01/23/20   [provider]  amiodarone (PACERONE) 100 MG tablet Take 50 mg by mouth daily. Twice a day    [provider]  amiodarone (PACERONE) 200 MG tablet  08/30/20   [provider]  atorvastatin (LIPITOR) 80 MG tablet Take 1 tablet (80 mg total) by mouth daily at 6 PM. 12/31/17   Strader,  Fransisco Hertz, PA-C  azithromycin (ZITHROMAX) 250 MG tablet  10/20/20   [provider]  BD PEN NEEDLE NANO 2ND GEN 32G X 4 MM MISC  03/05/20   [provider]  Blood Glucose Monitoring Suppl (ACCU-CHEK GUIDE ME) w/Device KIT  03/23/20   [provider]  cephALEXin (KEFLEX) 500 MG capsule Take 1 capsule (500 mg total) by mouth 2 (two) times daily. Take 1 tablet by mouth tonight, then one tomorrow AM and one in PM 01/14/21  Evelina Bucy, DPM  Cholecalciferol (VITAMIN D) 125 MCG (5000 UT) CAPS Take 1 capsule by mouth daily.    [provider]  clopidogrel (PLAVIX) 75 MG tablet Take 1 tablet by mouth daily. Patient not taking: Reported on 01/15/2021 09/12/12   [provider]  diazepam (VALIUM) 2 MG tablet Take 2 mg by mouth daily as needed. 06/21/19   [provider]  doxycycline (VIBRA-TABS) 100 MG tablet  02/24/20   [provider]  FLUoxetine (PROZAC) 20 MG capsule Take 1 capsule by mouth daily. 06/09/18   [provider]  gabapentin (NEURONTIN) 100 MG capsule Take 100 mg by mouth 3 (three) times daily.  03/09/18   [provider]  HYDROcodone-acetaminophen (NORCO) 10-325 MG tablet Take 1 tablet by mouth every 6 (six) hours as needed for moderate pain or severe pain. 1 tab po hs/ and prn.    [provider]  insulin glargine (LANTUS) 100 unit/mL SOPN Inject 0.1 mLs (10 Units total) into the skin at bedtime. Patient not taking: Reported on 01/15/2021 02/24/19   Barton Dubois, MD  levothyroxine (SYNTHROID) 150 MCG tablet  05/20/20   [provider]  levothyroxine (SYNTHROID) 50 MCG tablet Take 1 tablet (50 mcg total) by mouth daily at 6 (six) AM. 08/03/19   Georgette Shell, MD  Menthol, Topical Analgesic, (ASPERCREME MAX ROLL-ON) 16 % LIQD Apply 1 application topically as needed.    [provider]  mirtazapine (REMERON) 15 MG tablet  10/06/20   [provider]  mirtazapine (REMERON) 7.5 MG  tablet Take 7.5 mg by mouth at bedtime. 03/11/20   [provider]  oxyCODONE-acetaminophen (PERCOCET) 10-325 MG tablet Take 1 tablet by mouth every 4 (four) hours as needed for pain. 01/14/21   Evelina Bucy, DPM  pantoprazole (PROTONIX) 40 MG tablet Take 40 mg by mouth daily.  10/11/18   [provider]  polyethylene glycol (MIRALAX / GLYCOLAX) 17 g packet Take 17 g by mouth daily as needed for mild constipation. 08/02/19   Georgette Shell, MD  ranitidine (ZANTAC) 150 MG tablet Take 1 tablet by mouth 2 (two) times daily. Patient not taking: Reported on 01/15/2021 09/12/12   [provider]  rOPINIRole (REQUIP) 2 MG tablet Take 2 mg by mouth at bedtime.  09/03/18   [provider]  sacubitril-valsartan (ENTRESTO) 49-51 MG Take 1 tablet by mouth 2 (two) times daily. 03/11/21   Strader, Fransisco Hertz, PA-C  warfarin (COUMADIN) 2.5 MG tablet Take 1 tablet (2.5 mg total) by mouth daily. 01/26/21   [name], MD    Allergies    Ciprofloxacin, Iodine, Zolpidem, and Iodinated diagnostic agents  Review of Systems   Review of Systems  Respiratory:  Positive for shortness of breath.   All other systems reviewed and are negative.  Physical Exam Updated Vital Signs BP (!) 148/74   Pulse (!) 58   Temp 97.8 F (36.6 C) (Oral)   Resp 18   Ht 5' 7"  (1.702 m)   Wt 52.2 kg   SpO2 97%   BMI 18.01 kg/m   Physical Exam Vitals and nursing note reviewed.  Constitutional:      Appearance: She is well-developed.  HENT:     Head: Normocephalic and atraumatic.     Mouth/Throat:     Mouth: Mucous membranes are moist.     Pharynx: Oropharynx is clear.  Eyes:     Extraocular Movements: Extraocular movements intact.     Pupils: Pupils are  equal, round, and reactive to light.  Cardiovascular:     Rate and Rhythm: Normal rate and regular rhythm.  Pulmonary:     Effort: Pulmonary effort is normal.     Breath sounds: Normal breath sounds.  Abdominal:     General:  Bowel sounds are normal.     Palpations: Abdomen is soft.  Musculoskeletal:        General: Normal range of motion.     Cervical back: Normal range of motion and neck supple.  Skin:    General: Skin is warm.     Capillary Refill: Capillary refill takes less than 2 seconds.  Neurological:     General: No focal deficit present.     Mental Status: She is alert and oriented to person, place, and time.  Psychiatric:        Mood and Affect: Mood normal.        Behavior: Behavior normal.    ED Results / Procedures / Treatments   Labs (all labs ordered are listed, but only abnormal results are displayed) Labs Reviewed  BASIC METABOLIC PANEL - Abnormal; Notable for the following components:      Result Value   Glucose, Bld 124 (*)    Creatinine, Ser 1.55 (*)    Calcium 8.7 (*)    GFR, Estimated 32 (*)    All other components within normal limits  BRAIN NATRIURETIC PEPTIDE - Abnormal; Notable for the following components:   B Natriuretic Peptide 2,527.0 (*)    All other components within normal limits  CBC WITH DIFFERENTIAL/PLATELET - Abnormal; Notable for the following components:   RBC 3.55 (*)    Hemoglobin 11.2 (*)    MCV 102.8 (*)    All other components within normal limits  TROPONIN I (HIGH SENSITIVITY) - Abnormal; Notable for the following components:   Troponin I (High Sensitivity) 19 (*)    All other components within normal limits  RESP PANEL BY RT-PCR (FLU A&B, COVID) ARPGX2  TROPONIN I (HIGH SENSITIVITY)    EKG EKG Interpretation  Date/Time:  Thursday March 19 2021 09:04:32 EDT Ventricular Rate:  60 PR Interval:  169 QRS Duration: 101 QT Interval:  449 QTC Calculation: 449 R Axis:   99 Text Interpretation: Sinus rhythm Right axis deviation Nonspecific T abnrm, anterolateral leads Baseline wander in lead(s) V3 now in nsr Confirmed by Isla Pence [phone]) on 03/19/2021 9:07:20 AM  Radiology DG Chest Port 1 View  Result Date: 03/19/2021 CLINICAL DATA:   Shortness of breath since last night, elevated blood pressure in a 87 year old female. EXAM: PORTABLE CHEST 1 VIEW COMPARISON:  July 30, 2019. FINDINGS: EKG leads project over the chest. Post median sternotomy as before. Cardiomediastinal contours with stable cardiac enlargement. RIGHT-sided pleural effusion perhaps combined with some pleural scarring though blunting of the RIGHT costodiaphragmatic sulcus is new compared to previous imaging. Chain sutures in the RIGHT upper lobe as before. Linear airspace opacity in the RIGHT mid chest extending to the periphery from the hilum. Skin fold along the LEFT chest.  No visible pneumothorax. On limited assessment there is no acute skeletal process. IMPRESSION: Interval development of small RIGHT pleural effusion. Linear airspace disease in the RIGHT mid chest may represent developing scarring related to prior partial lung resection or atelectasis. Stable cardiac enlargement. Postsurgical changes in the RIGHT upper lobe. Electronically Signed   By: Zetta Bills M.D.   On: 03/19/2021 09:40    Procedures Procedures   Medications Ordered in ED Medications  acetaminophen (TYLENOL)  tablet 650 mg (has no administration in time range)  methylPREDNISolone sodium succinate (SOLU-MEDROL) 125 mg/2 mL injection 125 mg (125 mg Intravenous Given by Other 03/19/21 0959)  ipratropium-albuterol (DUONEB) 0.5-2.5 (3) MG/3ML nebulizer solution 3 mL (3 mLs Nebulization Given 03/19/21 1115)  furosemide (LASIX) injection 20 mg (20 mg Intravenous Given 03/19/21 1115)    ED Course  I have reviewed the triage vital signs and the nursing notes.  Pertinent labs & imaging results that were available during my care of the patient were reviewed by me and considered in my medical decision making (see chart for details).    MDM Rules/Calculators/A&P                           Covid neg.  Pt does have a small, right sided pleural effusion and bnp is mildly elevated.  Pt not on  lasix, so she is given a dose in the ED.  She has a hx of copd, so is given a neb and steroids.  She does not appear to be in any distress.  She is weak, but she is able to ambulate with assistance with oxygen staying 94-96%.  Pt's troponins are flat.  Pt's daughter worried about discharge since pt is weak.  However, pt does not meet admission criteria.  Pt has a walker and is encouraged to use that with ambulation.  Pt is encouraged to return if worse.    Debra [name] was evaluated in Emergency Department on 03/19/2021 for the symptoms described in the history of present illness. She was evaluated in the context of the global COVID-19 pandemic, which necessitated consideration that the patient might be at risk for infection with the SARS-CoV-2 virus that causes COVID-19. Institutional protocols and algorithms that pertain to the evaluation of patients at risk for COVID-19 are in a state of rapid change based on information released by regulatory bodies including the CDC and federal and state organizations. These policies and algorithms were followed during the patient's care in the ED.   Final Clinical Impression(s) / ED Diagnoses Final diagnoses:  Pleural effusion  COPD exacerbation ([place])  Stage 3b chronic kidney disease ([place])    Rx / DC Orders ED Discharge Orders          Ordered    furosemide (LASIX) 20 MG tablet  Daily        03/19/21 1239    predniSONE (DELTASONE) 10 MG tablet  Daily        03/19/21 1239             Isla Pence, MD 03/19/21 1243

## 2021-03-19 NOTE — ED Notes (Signed)
Pt discharged to home. Discharge instructions have been discussed with patient and/or family members. Pt verbally acknowledges understanding d/c instructions, and endorses comprehension to checkout at registration before leaving.  °

## 2021-03-19 NOTE — ED Notes (Signed)
Pt ambulated in room with assistance. Pt denies increased shob when ambulating, requires assistance to ambulate d/t endorsement of weakness. HR 65-70, O2 between 94-96. Pt tolerated well

## 2021-03-19 NOTE — Discharge Instructions (Signed)
Use your walker when ambulating.  Return if worse.

## 2021-04-22 DIAGNOSIS — M5136 Other intervertebral disc degeneration, lumbar region: Secondary | ICD-10-CM | POA: Diagnosis not present

## 2021-04-22 DIAGNOSIS — M1991 Primary osteoarthritis, unspecified site: Secondary | ICD-10-CM | POA: Diagnosis not present

## 2021-04-22 DIAGNOSIS — G894 Chronic pain syndrome: Secondary | ICD-10-CM | POA: Diagnosis not present

## 2021-04-22 DIAGNOSIS — M48061 Spinal stenosis, lumbar region without neurogenic claudication: Secondary | ICD-10-CM | POA: Diagnosis not present

## 2021-04-27 DIAGNOSIS — G894 Chronic pain syndrome: Secondary | ICD-10-CM | POA: Diagnosis not present

## 2021-05-08 ENCOUNTER — Telehealth: Payer: Self-pay | Admitting: Cardiology

## 2021-05-08 NOTE — Telephone Encounter (Signed)
Pt was denied for PAF and has done well on Entresto. We would like to keep pt on this medication. Is there any funding that you're aware of to assist pt?

## 2021-05-08 NOTE — Telephone Encounter (Signed)
Pt's daughter is also wanting to know if she can get samples of the Eliquis.

## 2021-05-08 NOTE — Telephone Encounter (Signed)
Pt's daughter stated that pt cannot afford Entresto 49-51 mg tablets. Pt was denied PAF and it is costing pt over $170.00/month. Please advise  Pt's daughter also called to inform office that pt is no longer taking Warfarin, but was switched to Eliquis 2.5 mg tablets d/t no one available to take pt to have INR checked.Hulen Skains and verified with pt's PCP, Dr. Delanna Ahmadi office. Will update pt's medication list to reflect changes.

## 2021-05-08 NOTE — Telephone Encounter (Signed)
Pt c/o medication issue:  1. Name of Medication: sacubitril-valsartan (ENTRESTO) 49-51 MG  2. How are you currently taking this medication (dosage and times per day)? Take 1 tablet by mouth 2 (two) times daily  3. Are you having a reaction (difficulty breathing--STAT)? no  4. What is your medication issue? Patient is in the donut whole, calling to see if they can get some sample. Patient's daughter also states that it Eliquis too but dont see that on her list of medication. Only see warfarin (COUMADIN) 2.5 MG tablet. Pleaee advise

## 2021-05-11 ENCOUNTER — Telehealth: Payer: Self-pay | Admitting: Cardiology

## 2021-05-11 NOTE — Telephone Encounter (Signed)
Patient calling the office for samples of medication:   1.  What medication and dosage are you requesting samples for? apixaban (ELIQUIS) 2.5 MG TABS tablet sacubitril-valsartan (ENTRESTO) 49-51 MG  2.  Are you currently out of this medication? Yes  Daughter of the patient needs to pick up the medications today

## 2021-05-11 NOTE — Telephone Encounter (Signed)
Pt's daughter called requesting Eliquis 2.5mg  samples. Indication: PAF Last office visit: 01/15/21  B Strader PA-C Scr: 1.55 on 03/19/21 Age: 87 Weight: 51.7kg  Based on above findings Eliquis 2.5mg  twice daily is the appropriate dose.  OK to give samples if available.

## 2021-05-12 NOTE — Telephone Encounter (Signed)
Spoke to [place] with [employer], who is going to bring samples for pt on 05/13/2021 and investigate further into the matter of helping pt afford Entresto.

## 2021-05-12 NOTE — Telephone Encounter (Signed)
Will fwd to Dr. Domenic Polite for review.

## 2021-05-12 NOTE — Telephone Encounter (Signed)
Spoke to pt's daughter and verbalized [employer] rep is going to look into why pt was denied. Will have pt/pt's daughter fill out a new set of PAF forms. Pt's daughter voiced understanding.

## 2021-05-18 ENCOUNTER — Ambulatory Visit: Payer: Self-pay | Admitting: *Deleted

## 2021-05-21 ENCOUNTER — Other Ambulatory Visit: Payer: Self-pay | Admitting: *Deleted

## 2021-05-21 MED ORDER — ENTRESTO 49-51 MG PO TABS: 1.0000 | ORAL_TABLET | Freq: Two times a day (BID) | ORAL | 3 refills | Status: DC

## 2021-05-21 NOTE — Telephone Encounter (Signed)
Patient assistance forms faxed to [employer].  

## 2021-05-22 DIAGNOSIS — I4891 Unspecified atrial fibrillation: Secondary | ICD-10-CM | POA: Diagnosis not present

## 2021-05-22 DIAGNOSIS — E039 Hypothyroidism, unspecified: Secondary | ICD-10-CM | POA: Diagnosis not present

## 2021-05-22 DIAGNOSIS — M5136 Other intervertebral disc degeneration, lumbar region: Secondary | ICD-10-CM | POA: Diagnosis not present

## 2021-05-22 DIAGNOSIS — J449 Chronic obstructive pulmonary disease, unspecified: Secondary | ICD-10-CM | POA: Diagnosis not present

## 2021-05-22 DIAGNOSIS — I10 Essential (primary) hypertension: Secondary | ICD-10-CM | POA: Diagnosis not present

## 2021-05-22 DIAGNOSIS — M48061 Spinal stenosis, lumbar region without neurogenic claudication: Secondary | ICD-10-CM | POA: Diagnosis not present

## 2021-05-22 DIAGNOSIS — G894 Chronic pain syndrome: Secondary | ICD-10-CM | POA: Diagnosis not present

## 2021-05-22 DIAGNOSIS — F419 Anxiety disorder, unspecified: Secondary | ICD-10-CM | POA: Diagnosis not present

## 2021-05-22 DIAGNOSIS — M1991 Primary osteoarthritis, unspecified site: Secondary | ICD-10-CM | POA: Diagnosis not present

## 2021-05-22 DIAGNOSIS — I2510 Atherosclerotic heart disease of native coronary artery without angina pectoris: Secondary | ICD-10-CM | POA: Diagnosis not present

## 2021-05-22 DIAGNOSIS — E119 Type 2 diabetes mellitus without complications: Secondary | ICD-10-CM | POA: Diagnosis not present

## 2021-05-22 DIAGNOSIS — Z681 Body mass index (BMI) 19 or less, adult: Secondary | ICD-10-CM | POA: Diagnosis not present

## 2021-05-25 ENCOUNTER — Telehealth: Payer: Self-pay | Admitting: Cardiology

## 2021-05-25 NOTE — Telephone Encounter (Signed)
STAT if HR is under 50 or over 120 (normal HR is 60-100 beats per minute)  What is your heart rate? 44  Do you have a log of your heart rate readings (document readings)? 47,44  Do you have any other symptoms? Dizzy, BP 113/55

## 2021-05-25 NOTE — Telephone Encounter (Signed)
Pt's daughter stated that pt is c/o dizziness when standing. She took pt's bp/hr twice this morning: 122/47 hr- 47 133/55 hr- 45  Pt denies SOB/numbness/nausea. Please advise.

## 2021-05-25 NOTE — Telephone Encounter (Signed)
Pt's daughter to bring pt on 11/22 at 4:15pm for Nurse Visit - EKG

## 2021-05-26 ENCOUNTER — Ambulatory Visit (INDEPENDENT_AMBULATORY_CARE_PROVIDER_SITE_OTHER): Payer: Medicare HMO

## 2021-05-26 ENCOUNTER — Other Ambulatory Visit: Payer: Self-pay

## 2021-05-26 DIAGNOSIS — I480 Paroxysmal atrial fibrillation: Secondary | ICD-10-CM

## 2021-05-26 NOTE — Progress Notes (Signed)
Patient presents today for Nurse Visit- EKG per Dr. Domenic Polite.  Pt states that she is having some dizziness and weakness with a shaky feeling.  Patients heart rate per EKG is 58bpm.

## 2021-05-27 ENCOUNTER — Telehealth: Payer: Self-pay | Admitting: Cardiology

## 2021-05-27 ENCOUNTER — Ambulatory Visit (INDEPENDENT_AMBULATORY_CARE_PROVIDER_SITE_OTHER): Payer: Medicare HMO

## 2021-05-27 ENCOUNTER — Ambulatory Visit: Payer: Medicare HMO

## 2021-05-27 ENCOUNTER — Other Ambulatory Visit: Payer: Self-pay | Admitting: Cardiology

## 2021-05-27 ENCOUNTER — Other Ambulatory Visit: Payer: Self-pay

## 2021-05-27 DIAGNOSIS — R001 Bradycardia, unspecified: Secondary | ICD-10-CM

## 2021-05-27 NOTE — Telephone Encounter (Signed)
Patients daughter to bring patient to [facility] office today for [facility] Patch placement. 72 hours- Bradycardia per Dr. Domenic Polite

## 2021-05-27 NOTE — Telephone Encounter (Signed)
LONG TERM MONITOR   PERCERT  Copay: $8.67  : [facility] MONITOR

## 2021-05-27 NOTE — Telephone Encounter (Signed)
-----   Message from [name], MD sent at 05/27/2021  8:37 AM EST ----- See recent telephone note and nurse office visit for ECG.  I reviewed the tracing which shows sinus bradycardia.  Heart rate in the 50s.  Not entirely clear that this explains her feelings of shakiness and lightheadedness, particularly with normal blood pressures.  She is not on any other medications that should be slowing her heart rate.  Our only option would be to hold amiodarone and see if she feels better eventually, but she would remain at higher risk of going back into atrial fibrillation.  We could alternatively have her wear a ZIO patch for 72 hours to make sure she is not having more substantial bradycardia before making any medication changes. ----- Message ----- From: Tildon Husky Sent: 05/26/2021   4:39 PM EST To: [name], MD

## 2021-05-27 NOTE — Telephone Encounter (Signed)
Follow Up:      Patient had EKG yesterday, she was waiting to see what she needs to do. Heart rate at  this time is 53.

## 2021-06-01 DIAGNOSIS — R001 Bradycardia, unspecified: Secondary | ICD-10-CM

## 2021-06-04 ENCOUNTER — Ambulatory Visit: Payer: Medicare HMO | Admitting: Cardiology

## 2021-06-09 ENCOUNTER — Encounter: Payer: Self-pay | Admitting: *Deleted

## 2021-06-09 ENCOUNTER — Ambulatory Visit (INDEPENDENT_AMBULATORY_CARE_PROVIDER_SITE_OTHER): Payer: Medicare HMO | Admitting: Cardiology

## 2021-06-09 ENCOUNTER — Telehealth: Payer: Self-pay | Admitting: Cardiology

## 2021-06-09 ENCOUNTER — Telehealth: Payer: Self-pay | Admitting: *Deleted

## 2021-06-09 ENCOUNTER — Encounter: Payer: Self-pay | Admitting: Cardiology

## 2021-06-09 VITALS — BP 172/68 | HR 61 | Ht 67.0 in | Wt 115.0 lb

## 2021-06-09 DIAGNOSIS — I25119 Atherosclerotic heart disease of native coronary artery with unspecified angina pectoris: Secondary | ICD-10-CM | POA: Diagnosis not present

## 2021-06-09 DIAGNOSIS — I255 Ischemic cardiomyopathy: Secondary | ICD-10-CM | POA: Diagnosis not present

## 2021-06-09 DIAGNOSIS — I480 Paroxysmal atrial fibrillation: Secondary | ICD-10-CM

## 2021-06-09 DIAGNOSIS — N1832 Chronic kidney disease, stage 3b: Secondary | ICD-10-CM | POA: Diagnosis not present

## 2021-06-09 DIAGNOSIS — R001 Bradycardia, unspecified: Secondary | ICD-10-CM | POA: Diagnosis not present

## 2021-06-09 NOTE — Telephone Encounter (Signed)
Pts daughter is wanting to know if she can change her appt today to a telehealth visit... please advise

## 2021-06-09 NOTE — Progress Notes (Signed)
Debra [name] at [facility] was just delivered today and has not been delivered to our intake team.  I have placed an expedite but the report may not be available today.

## 2021-06-09 NOTE — Progress Notes (Signed)
Virtual Visit via Telephone Note   This visit type was conducted due to national recommendations for restrictions regarding the COVID-19 Pandemic (e.g. social distancing) in an effort to limit this patient's exposure and mitigate transmission in our community.  Due to her co-morbid illnesses, this patient is at least at moderate risk for complications without adequate follow up.  This format is felt to be most appropriate for this patient at this time.  The patient did not have access to video technology/had technical difficulties with video requiring transitioning to audio format only (telephone).  All issues noted in this document were discussed and addressed.  No physical exam could be performed with this format.  Please refer to the patient's chart for her  consent to telehealth for [facility].    Date:  06/09/2021   ID:  Debra [name], DOB [birth year], MRN [id number] The patient was identified using 2 identifiers.  Patient Location: Home Provider Location: Office/Clinic   PCP:  Sharilyn Sites, MD   [facility] Providers Cardiologist:  Rozann Lesches, MD {  Evaluation Performed:  Follow-Up Visit  Chief Complaint:  Cardiac follow-up  History of Present Illness:    Debra [name] is a 87 y.o. female last seen in July by Ms. Strader PA-C.  We spoke by phone today, her daughter was also present.  She does describe an occasional sense of palpitations although this does not seem to correlate with any elevations in heart rate.  Lowest heart rates have been in the 40s by report.  She has felt somewhat shaky recently.  No falls or syncope.  She did wear the Zio patch as ordered, results are pending at this time.  I reviewed her medications which are noted below.  She does remain on low-dose amiodarone for suppression of atrial fibrillation, otherwise no AV nodal blockers.  Assistance program requested for both Eliquis and Entresto with follow-up pending.  We could ultimately consider  switching to Xarelto if cheaper than Eliquis, and change Entresto to losartan.  Blood pressure at home has been running 130-140/70-80.  Today's measurement was an outlier.  She was scheduled to wear a Zio patch for investigation of reported bradycardia.  Also having symptoms of shakiness and lightheadedness with reportedly normal blood pressure.  She is on amiodarone for suppression of atrial fibrillation, otherwise no AV nodal blockers at this point.  Past Medical History:  Diagnosis Date   Arthritis    CKD (chronic kidney disease) stage 4, GFR 15-29 ml/min (HCC)    Colon cancer (HCC)    COPD (chronic obstructive pulmonary disease) ([place])    Coronary artery disease    a. s/p CABG in 1997 (no detials provided) b. 12/2017: NSTEMI with cath showing patent LIMA-LAD with occluded SVG-OM and presumed occluded SVG-RCA but unable to be visualized, RCA was filled by collaterals. Medical management recommended. c.  07/2019: NSTEMI with rapid atrial fibrillation, stable coronary and bypass graft anatomy by cardiac catheterization   DDD (degenerative disc disease), lumbar    Diabetic neuropathy (HCC)    Essential hypertension    GERD (gastroesophageal reflux disease)    Gout    Hypothyroidism    Ischemic cardiomyopathy    Restless leg syndrome    Type 2 diabetes mellitus ([place])    Past Surgical History:  Procedure Laterality Date   ABDOMINAL HYSTERECTOMY     CHOLECYSTECTOMY     COLON RESECTION     CORONARY ARTERY BYPASS GRAFT     ILIAC ARTERY STENT  LEFT HEART CATH AND CORS/GRAFTS ANGIOGRAPHY N/A 12/28/2017   Procedure: LEFT HEART CATH AND CORS/GRAFTS ANGIOGRAPHY;  Surgeon: Martinique, Peter M, MD;  Location: [place] [facility];  Service: Cardiovascular;  Laterality: N/A;   LEFT HEART CATH AND CORS/GRAFTS ANGIOGRAPHY N/A 08/01/2019   Procedure: LEFT HEART CATH AND CORS/GRAFTS ANGIOGRAPHY;  Surgeon: Burnell Blanks, MD;  Location: [place] [facility];  Service: Cardiovascular;  Laterality:  N/A;   LUNG SURGERY Left 2012   left lung resection   RENAL ARTERY STENT     THYROID SURGERY     ULTRASOUND GUIDANCE FOR VASCULAR ACCESS  12/28/2017   Procedure: Ultrasound Guidance For Vascular Access;  Surgeon: Martinique, Peter M, MD;  Location: [place] [facility];  Service: Cardiovascular;;     Current Meds  Medication Sig   acetaminophen (TYLENOL) 325 MG tablet Take 650 mg by mouth every 6 (six) hours as needed for mild pain.   albuterol (VENTOLIN HFA) 108 (90 Base) MCG/ACT inhaler Inhale 1 puff into the lungs every 6 (six) hours as needed for shortness of breath or wheezing.   amiodarone (PACERONE) 200 MG tablet Take 100 mg by mouth daily.   apixaban (ELIQUIS) 2.5 MG TABS tablet Take 2.5 mg by mouth 2 (two) times daily.   atorvastatin (LIPITOR) 80 MG tablet Take 1 tablet (80 mg total) by mouth daily at 6 PM.   Cholecalciferol (VITAMIN D) 125 MCG (5000 UT) CAPS Take 1 capsule by mouth daily.   diazepam (VALIUM) 2 MG tablet Take 2 mg by mouth daily as needed.   furosemide (LASIX) 20 MG tablet Take 1 tablet (20 mg total) by mouth daily.   gabapentin (NEURONTIN) 100 MG capsule Take 100 mg by mouth 3 (three) times daily.    HYDROcodone-acetaminophen (NORCO) 10-325 MG tablet Take 1 tablet by mouth every 6 (six) hours as needed for moderate pain or severe pain. 1 tab po hs/ and prn.   levothyroxine (SYNTHROID) 50 MCG tablet Take 1 tablet (50 mcg total) by mouth daily at 6 (six) AM.   Menthol, Topical Analgesic, (ASPERCREME MAX ROLL-ON) 16 % LIQD Apply 1 application topically as needed.   polyethylene glycol (MIRALAX / GLYCOLAX) 17 g packet Take 17 g by mouth daily as needed for mild constipation.   sacubitril-valsartan (ENTRESTO) 49-51 MG Take 1 tablet by mouth 2 (two) times daily.     Allergies:   Ciprofloxacin, Iodine, Zolpidem, and Iodinated diagnostic agents   ROS: No spontaneous bleeding problems.  Prior CV studies:   The following studies were reviewed today:  Cardiac  catheterization 08/01/2019: Ost LAD to Prox LAD lesion is 100% stenosed. Ost 1st Mrg to 1st Mrg lesion is 40% stenosed. Ost 2nd Mrg lesion is 30% stenosed. Ost 3rd Mrg lesion is 90% stenosed. Ost RCA to Dist RCA lesion is 100% stenosed. LIMA and is normal in caliber. The graft exhibits no disease. SVG. Origin to Prox Graft lesion is 100% stenosed. SVG due to known occlusion. Origin to Prox Graft lesion is 100% stenosed. Prox Cx to Mid Cx lesion is 10% stenosed.   1. Triple vessel CAD s/p CABG with 1 patent bypass graft 2. Occluded proximal LAD. Patent LIMA to mid LAD 3. Known occlusion of the SVG to OM. The mid Circumflex stent is patent. Severe disease in a small caliber OM branch that fills from left to left collaterals. Unchanged from last cath.  4. Chronic occlusion of the proximal RCA. Known occlusion of the vein graft to the distal RCA. The distal  RCA fills from left to right collaterals.    Recommendations: Coronary disease unchanged from last cath in 2019. Continue medical management of CAD.    Echocardiogram 07/31/2019:  1. Left ventricular ejection fraction, by visual estimation, is 35 to  40%. The left ventricle has moderately decreased function. There is no  left ventricular hypertrophy.   2. Mild dyskinesis of the left ventricular, basal-mid inferoseptal wall  and inferior wall (consistent with previous infarction in the right  coronary artery distribution).   3. Severe hypokinesis of the left ventricular, basal anteroseptal wall.  The apical segments contract normally (consistent with occlusion of the  proximal LAD artery with patent graft to the distal vessel).   4. Abnormal septal motion consistent with post-operative status.   5. Elevated left atrial pressure.   6. Left ventricular diastolic parameters are consistent with Grade II  diastolic dysfunction (pseudonormalization).   7. The left ventricle demonstrates regional wall motion abnormalities.   8. Global right  ventricle has normal systolic function.The right  ventricular size is normal. No increase in right ventricular wall  thickness.   9. Left atrial size was mildly dilated.  10. Right atrial size was normal.  11. Mild mitral annular calcification.  12. The mitral valve is normal in structure. Mild to moderate mitral valve  regurgitation.  13. The tricuspid valve is normal in structure.  14. The tricuspid valve is normal in structure. Tricuspid valve  regurgitation is not demonstrated.  15. The aortic valve is normal in structure. Aortic valve regurgitation is  not visualized. Mild aortic valve sclerosis without stenosis.  16. The pulmonic valve was grossly normal. Pulmonic valve regurgitation is  trivial.  17. TR signal is inadequate for assessing pulmonary artery systolic  pressure.  18. The inferior vena cava is dilated in size with <50% respiratory  variability, suggesting right atrial pressure of 15 mmHg.  19. No significant change from prior study.  20. Prior images reviewed side by side.   Labs/Other Tests and Data Reviewed:    EKG:  An ECG dated 05/26/2021 was personally reviewed today and demonstrated:  Sinus bradycardia.  Recent Labs: 03/19/2021: B Natriuretic Peptide 2,527.0; BUN 21; Creatinine, Ser 1.55; Hemoglobin 11.2; Platelets 222; Potassium 4.0; Sodium 139   Wt Readings from Last 3 Encounters:  06/09/21 115 lb (52.2 kg)  03/19/21 115 lb (52.2 kg)  01/15/21 114 lb (51.7 kg)        Objective:    Vital Signs:  BP (!) 172/68   Pulse 61   Ht 5\' 7"  (1.702 m)   Wt 115 lb (52.2 kg)   BMI 18.01 kg/m    No breathlessness or cough noted while speaking on the phone.  ASSESSMENT & PLAN:    1.  Paroxysmal atrial fibrillation, CHA2DS2-VASc score is 7.  She remains on low-dose Eliquis for stroke prophylaxis, lab work from September reviewed.  She is also on amiodarone 100 mg daily for rhythm suppression.  No AV nodal blockers.  Follow-up results of Zio patch.  Main concern  is exclusion of substantial bradycardia or heart block that would necessitate discontinuation of amiodarone.  2.  Multivessel CAD status post CABG in 1997.  She has subsequently documented graft disease with occlusion of the SVG to OM, patent LIMA to LAD, patent prior circumflex stent site, and CTO of the RCA with collaterals.  Medical therapy continues without progressive angina.  She is on Lipitor, no aspirin given use of Eliquis.  3.  Ischemic cardiomyopathy, LVEF 35 to 40%.  Currently on Entresto and Lasix.  No AV nodal blockers given bradycardia.  May need to consider switching Entresto to losartan depending on cost and assistance program results.  4.  CKD stage IIIb, creatinine 1.55.  Time:   Today, I have spent 10 minutes with the patient with telehealth technology discussing the above problems.     Medication Adjustments/Labs and Tests Ordered: Current medicines are reviewed at length with the patient today.  Concerns regarding medicines are outlined above.   Tests Ordered: No orders of the defined types were placed in this encounter.   Medication Changes: No orders of the defined types were placed in this encounter.   Follow Up:  In Person  3 months  Signed, Rozann Lesches, MD  06/09/2021 1:18 PM    [place]

## 2021-06-09 NOTE — Patient Instructions (Addendum)
Medication Instructions:  Your physician recommends that you continue on your current medications as directed. Please refer to the Current Medication list given to you today. Alda Berthold will be contacting you with information about your entresto and eliquis patient assistance applications.  Labwork: none  Testing/Procedures: none  Follow-Up: Your physician recommends that you schedule a follow-up appointment in: 3 months  Any Other Special Instructions Will Be Listed Below (If Applicable).  If you need a refill on your cardiac medications before your next appointment, please call your pharmacy.

## 2021-06-09 NOTE — Telephone Encounter (Signed)
Daughter is requesting a call back to discuss the status of patient assistance for entresto and eliquis

## 2021-06-09 NOTE — Telephone Encounter (Signed)
  Patient Consent for Virtual Visit        Debra [name] has provided verbal consent on 06/09/2021 for a virtual visit (video or telephone).   CONSENT FOR VIRTUAL VISIT FOR:  Debra [name]  By participating in this virtual visit I agree to the following:  I hereby voluntarily request, consent and authorize [facility] and its employed or contracted physicians, physician assistants, nurse practitioners or other licensed health care professionals (the Practitioner), to provide me with telemedicine health care services (the "Services") as deemed necessary by the treating Practitioner. I acknowledge and consent to receive the Services by the Practitioner via telemedicine. I understand that the telemedicine visit will involve communicating with the Practitioner through live audiovisual communication technology and the disclosure of certain medical information by electronic transmission. I acknowledge that I have been given the opportunity to request an in-person assessment or other available alternative prior to the telemedicine visit and am voluntarily participating in the telemedicine visit.  I understand that I have the right to withhold or withdraw my consent to the use of telemedicine in the course of my care at any time, without affecting my right to future care or treatment, and that the Practitioner or I may terminate the telemedicine visit at any time. I understand that I have the right to inspect all information obtained and/or recorded in the course of the telemedicine visit and may receive copies of available information for a reasonable fee.  I understand that some of the potential risks of receiving the Services via telemedicine include:  Delay or interruption in medical evaluation due to technological equipment failure or disruption; Information transmitted may not be sufficient (e.g. poor resolution of images) to allow for appropriate medical decision making by the Practitioner; and/or   In rare instances, security protocols could fail, causing a breach of personal health information.  Furthermore, I acknowledge that it is my responsibility to provide information about my medical history, conditions and care that is complete and accurate to the best of my ability. I acknowledge that Practitioner's advice, recommendations, and/or decision may be based on factors not within their control, such as incomplete or inaccurate data provided by me or distortions of diagnostic images or specimens that may result from electronic transmissions. I understand that the practice of medicine is not an exact science and that Practitioner makes no warranties or guarantees regarding treatment outcomes. I acknowledge that a copy of this consent can be made available to me via my patient portal ([facility]), or I can request a printed copy by calling the office of [facility].    I understand that my insurance will be billed for this visit.   I have read or had this consent read to me. I understand the contents of this consent, which adequately explains the benefits and risks of the Services being provided via telemedicine.  I have been provided ample opportunity to ask questions regarding this consent and the Services and have had my questions answered to my satisfaction. I give my informed consent for the services to be provided through the use of telemedicine in my medical care

## 2021-06-10 NOTE — Telephone Encounter (Signed)
Called to check on the Pt assistance for Entresto and Eliquis. Both state they do not have the patient assistance forms. Refaxed forms to [employer]. Pt assistance forms for Eliquis not available. Will call and verify they were dropped off in office.

## 2021-06-11 ENCOUNTER — Telehealth: Payer: Self-pay | Admitting: *Deleted

## 2021-06-11 NOTE — Telephone Encounter (Signed)
-----   Message from [name], MD sent at 06/09/2021  5:01 PM EST ----- Results reviewed.  Cardiac monitor shows sinus rhythm with average heart rate in the 50s, occasional atrial ectopy, and multiple episodes of supraventricular tachycardia some of which could have been atrial fibrillation.  There were no sustained episodes of heart block or pauses.  I would recommend that we continue amiodarone at this point.  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

## 2021-06-11 NOTE — Telephone Encounter (Signed)
Patient informed. Copy sent to PCP °

## 2021-06-11 NOTE — Telephone Encounter (Signed)
Samples at the front desk, ready for pick up

## 2021-06-11 NOTE — Telephone Encounter (Signed)
Patient's daughter returned your call, she won't be available again until after 5pm. You can leave a detail message on her voicemail.

## 2021-06-11 NOTE — Telephone Encounter (Signed)
Patient assistance applications dropped off at [place] office per daughter [name]. Patient will come to [place] office next week to get samples

## 2021-06-15 DIAGNOSIS — Z681 Body mass index (BMI) 19 or less, adult: Secondary | ICD-10-CM | POA: Diagnosis not present

## 2021-06-15 DIAGNOSIS — I10 Essential (primary) hypertension: Secondary | ICD-10-CM | POA: Diagnosis not present

## 2021-06-15 DIAGNOSIS — E039 Hypothyroidism, unspecified: Secondary | ICD-10-CM | POA: Diagnosis not present

## 2021-06-16 ENCOUNTER — Ambulatory Visit: Payer: Medicare HMO | Admitting: Podiatry

## 2021-06-16 ENCOUNTER — Other Ambulatory Visit: Payer: Self-pay

## 2021-06-16 DIAGNOSIS — E1151 Type 2 diabetes mellitus with diabetic peripheral angiopathy without gangrene: Secondary | ICD-10-CM | POA: Diagnosis not present

## 2021-06-16 DIAGNOSIS — E1169 Type 2 diabetes mellitus with other specified complication: Secondary | ICD-10-CM | POA: Diagnosis not present

## 2021-06-16 DIAGNOSIS — M2032 Hallux varus (acquired), left foot: Secondary | ICD-10-CM

## 2021-06-16 DIAGNOSIS — B351 Tinea unguium: Secondary | ICD-10-CM | POA: Diagnosis not present

## 2021-06-16 DIAGNOSIS — M2041 Other hammer toe(s) (acquired), right foot: Secondary | ICD-10-CM | POA: Diagnosis not present

## 2021-06-16 DIAGNOSIS — M2042 Other hammer toe(s) (acquired), left foot: Secondary | ICD-10-CM

## 2021-06-16 NOTE — Progress Notes (Signed)
°  Subjective:  Patient ID: Debra [name], female    DOB: 08-15-1928,  MRN: [id number]  Chief Complaint  Patient presents with   Follow-up    3 month follow up  bruise on right foot    DOS: 01/14/21 Procedure: Left great toe arthroplasty, flexor tenotomy  87 y.o. female presents with the above complaint. History confirmed with patient. Has bruise to the right foot unsure how it occurred but is on BT. Denies pain. No pain at left great toe but does complain the toe is a little crooked. Requests care of nails today.  Objective:  Physical Exam: no tenderness at the surgical site, no edema noted, and calf supple, nontender. Mild interphalangeus deformity. Onychomycosis of the toenails noted with pain to palpation Incision: healed Contusion right forefoot without POP.  Assessment:   1. Hallux malleus, left   2. Onychomycosis of multiple toenails with type 2 diabetes mellitus and peripheral angiopathy (HCC)   3. Hammertoe, bilateral    Plan:  Patient was evaluated and treated and all questions answered.  Post-operative State -Callused area remains well-healed with mild interphangeal deformity but without recurrence  Bruise RLE -Likely 2/2 small injury and on blood thinner, no pain today.  Onychomycosis with DM, coagulation defect -Nails debrided x10 -Would benefit from DM shoes. Casted today.  Procedure: Nail Debridement Type of Debridement: manual, sharp debridement. Instrumentation: Nail nipper, rotary burr. Number of Nails: 10    Return in about 3 months (around 09/14/2021) for Diabetic Foot Care.

## 2021-06-24 DIAGNOSIS — E039 Hypothyroidism, unspecified: Secondary | ICD-10-CM | POA: Diagnosis not present

## 2021-06-24 DIAGNOSIS — M1991 Primary osteoarthritis, unspecified site: Secondary | ICD-10-CM | POA: Diagnosis not present

## 2021-06-24 DIAGNOSIS — G894 Chronic pain syndrome: Secondary | ICD-10-CM | POA: Diagnosis not present

## 2021-06-24 DIAGNOSIS — I10 Essential (primary) hypertension: Secondary | ICD-10-CM | POA: Diagnosis not present

## 2021-07-07 DIAGNOSIS — J069 Acute upper respiratory infection, unspecified: Secondary | ICD-10-CM | POA: Diagnosis not present

## 2021-07-10 ENCOUNTER — Telehealth: Payer: Self-pay | Admitting: Cardiology

## 2021-07-10 MED ORDER — APIXABAN 2.5 MG PO TABS: 2.5000 mg | ORAL_TABLET | Freq: Two times a day (BID) | ORAL | 0 refills | Status: DC

## 2021-07-10 NOTE — Telephone Encounter (Signed)
Eliquis 2.5 mg samples provided Patient assistance application received by [employer] on 07/08/2021 and takes 3-5 business days to process Daughter aware.

## 2021-07-10 NOTE — Telephone Encounter (Signed)
Patient calling the office for samples of medication:   1.  What medication and dosage are you requesting samples for? Eliquis  2.  Are you currently out of this medication?  completely out of her medicine, waiting to hear from patient assistance program

## 2021-07-10 NOTE — Addendum Note (Signed)
Addended by: Merlene Laughter on: 07/10/2021 03:38 PM   Modules accepted: Orders

## 2021-07-21 ENCOUNTER — Telehealth: Payer: Self-pay | Admitting: *Deleted

## 2021-07-21 ENCOUNTER — Other Ambulatory Visit: Payer: Self-pay | Admitting: *Deleted

## 2021-07-21 MED ORDER — APIXABAN 2.5 MG PO TABS: 2.5000 mg | ORAL_TABLET | Freq: Two times a day (BID) | ORAL | 11 refills | Status: DC

## 2021-07-21 NOTE — Telephone Encounter (Signed)
Fax received from [employer] ( Eliquis ) stating that pt needs to meet 3% out of pocket expense of $221.95 before she is approved for the program.

## 2021-08-24 ENCOUNTER — Telehealth: Payer: Self-pay | Admitting: Cardiology

## 2021-08-24 NOTE — Telephone Encounter (Signed)
Patient calling the office for samples of medication:   1.  What medication and dosage are you requesting samples for? sacubitril-valsartan (ENTRESTO) 49-51 MG  2.  Are you currently out of this medication? Yes, states she usually gets the medication free from the manufacturer, but she has not received it.

## 2021-08-25 NOTE — Telephone Encounter (Signed)
Spoke with Shirlean Mylar with [employer] [facility]oll) who states that they are reviewing the prescription and will reach out to the pt regarding shipment. Daughter [name] notified. One bottle of samples provided. Lot # [id number], Exp: nov 2024, #28 Tablets.

## 2021-08-26 DIAGNOSIS — Z681 Body mass index (BMI) 19 or less, adult: Secondary | ICD-10-CM | POA: Diagnosis not present

## 2021-08-26 DIAGNOSIS — R42 Dizziness and giddiness: Secondary | ICD-10-CM | POA: Diagnosis not present

## 2021-08-26 DIAGNOSIS — M5136 Other intervertebral disc degeneration, lumbar region: Secondary | ICD-10-CM | POA: Diagnosis not present

## 2021-08-26 DIAGNOSIS — G894 Chronic pain syndrome: Secondary | ICD-10-CM | POA: Diagnosis not present

## 2021-08-26 DIAGNOSIS — E538 Deficiency of other specified B group vitamins: Secondary | ICD-10-CM | POA: Diagnosis not present

## 2021-08-26 DIAGNOSIS — I10 Essential (primary) hypertension: Secondary | ICD-10-CM | POA: Diagnosis not present

## 2021-08-26 DIAGNOSIS — M48061 Spinal stenosis, lumbar region without neurogenic claudication: Secondary | ICD-10-CM | POA: Diagnosis not present

## 2021-08-26 DIAGNOSIS — E559 Vitamin D deficiency, unspecified: Secondary | ICD-10-CM | POA: Diagnosis not present

## 2021-08-26 DIAGNOSIS — R202 Paresthesia of skin: Secondary | ICD-10-CM | POA: Diagnosis not present

## 2021-08-26 DIAGNOSIS — J449 Chronic obstructive pulmonary disease, unspecified: Secondary | ICD-10-CM | POA: Diagnosis not present

## 2021-08-26 DIAGNOSIS — E039 Hypothyroidism, unspecified: Secondary | ICD-10-CM | POA: Diagnosis not present

## 2021-08-27 ENCOUNTER — Other Ambulatory Visit (HOSPITAL_COMMUNITY): Payer: Self-pay | Admitting: Internal Medicine

## 2021-08-27 ENCOUNTER — Other Ambulatory Visit: Payer: Self-pay | Admitting: Internal Medicine

## 2021-08-27 DIAGNOSIS — R202 Paresthesia of skin: Secondary | ICD-10-CM

## 2021-08-31 DIAGNOSIS — J069 Acute upper respiratory infection, unspecified: Secondary | ICD-10-CM | POA: Diagnosis not present

## 2021-09-01 ENCOUNTER — Emergency Department (HOSPITAL_COMMUNITY)
Admission: EM | Admit: 2021-09-01 | Discharge: 2021-09-01 | Disposition: A | Discharge status: Home / Self Care | Payer: Medicare HMO | Source: Home / Self Care | Attending: Emergency Medicine | Admitting: Emergency Medicine

## 2021-09-01 ENCOUNTER — Encounter (HOSPITAL_COMMUNITY): Payer: Self-pay | Admitting: Emergency Medicine

## 2021-09-01 ENCOUNTER — Emergency Department (HOSPITAL_COMMUNITY): Payer: Medicare HMO

## 2021-09-01 ENCOUNTER — Other Ambulatory Visit: Payer: Self-pay

## 2021-09-01 DIAGNOSIS — I110 Hypertensive heart disease with heart failure: Secondary | ICD-10-CM | POA: Insufficient documentation

## 2021-09-01 DIAGNOSIS — I509 Heart failure, unspecified: Secondary | ICD-10-CM | POA: Insufficient documentation

## 2021-09-01 DIAGNOSIS — R7989 Other specified abnormal findings of blood chemistry: Secondary | ICD-10-CM | POA: Diagnosis not present

## 2021-09-01 DIAGNOSIS — R778 Other specified abnormalities of plasma proteins: Secondary | ICD-10-CM | POA: Diagnosis not present

## 2021-09-01 DIAGNOSIS — I700 Atherosclerosis of aorta: Secondary | ICD-10-CM | POA: Diagnosis not present

## 2021-09-01 DIAGNOSIS — M545 Low back pain, unspecified: Secondary | ICD-10-CM | POA: Insufficient documentation

## 2021-09-01 DIAGNOSIS — R0600 Dyspnea, unspecified: Secondary | ICD-10-CM | POA: Diagnosis not present

## 2021-09-01 DIAGNOSIS — J45909 Unspecified asthma, uncomplicated: Secondary | ICD-10-CM | POA: Diagnosis not present

## 2021-09-01 DIAGNOSIS — E860 Dehydration: Secondary | ICD-10-CM | POA: Diagnosis present

## 2021-09-01 DIAGNOSIS — Z515 Encounter for palliative care: Secondary | ICD-10-CM | POA: Diagnosis not present

## 2021-09-01 DIAGNOSIS — I10 Essential (primary) hypertension: Secondary | ICD-10-CM | POA: Diagnosis not present

## 2021-09-01 DIAGNOSIS — R0689 Other abnormalities of breathing: Secondary | ICD-10-CM | POA: Diagnosis not present

## 2021-09-01 DIAGNOSIS — N184 Chronic kidney disease, stage 4 (severe): Secondary | ICD-10-CM | POA: Diagnosis present

## 2021-09-01 DIAGNOSIS — I2510 Atherosclerotic heart disease of native coronary artery without angina pectoris: Secondary | ICD-10-CM | POA: Insufficient documentation

## 2021-09-01 DIAGNOSIS — Z20822 Contact with and (suspected) exposure to covid-19: Secondary | ICD-10-CM | POA: Diagnosis not present

## 2021-09-01 DIAGNOSIS — Z7901 Long term (current) use of anticoagulants: Secondary | ICD-10-CM | POA: Insufficient documentation

## 2021-09-01 DIAGNOSIS — J189 Pneumonia, unspecified organism: Secondary | ICD-10-CM | POA: Diagnosis not present

## 2021-09-01 DIAGNOSIS — K219 Gastro-esophageal reflux disease without esophagitis: Secondary | ICD-10-CM | POA: Diagnosis not present

## 2021-09-01 DIAGNOSIS — I130 Hypertensive heart and chronic kidney disease with heart failure and stage 1 through stage 4 chronic kidney disease, or unspecified chronic kidney disease: Secondary | ICD-10-CM | POA: Diagnosis present

## 2021-09-01 DIAGNOSIS — E1122 Type 2 diabetes mellitus with diabetic chronic kidney disease: Secondary | ICD-10-CM | POA: Diagnosis present

## 2021-09-01 DIAGNOSIS — Z66 Do not resuscitate: Secondary | ICD-10-CM | POA: Diagnosis not present

## 2021-09-01 DIAGNOSIS — R54 Age-related physical debility: Secondary | ICD-10-CM | POA: Diagnosis not present

## 2021-09-01 DIAGNOSIS — E119 Type 2 diabetes mellitus without complications: Secondary | ICD-10-CM | POA: Insufficient documentation

## 2021-09-01 DIAGNOSIS — Z79899 Other long term (current) drug therapy: Secondary | ICD-10-CM | POA: Insufficient documentation

## 2021-09-01 DIAGNOSIS — I517 Cardiomegaly: Secondary | ICD-10-CM | POA: Diagnosis not present

## 2021-09-01 DIAGNOSIS — M4126 Other idiopathic scoliosis, lumbar region: Secondary | ICD-10-CM | POA: Diagnosis not present

## 2021-09-01 DIAGNOSIS — I5022 Chronic systolic (congestive) heart failure: Secondary | ICD-10-CM | POA: Diagnosis not present

## 2021-09-01 DIAGNOSIS — Z951 Presence of aortocoronary bypass graft: Secondary | ICD-10-CM | POA: Diagnosis not present

## 2021-09-01 DIAGNOSIS — I248 Other forms of acute ischemic heart disease: Secondary | ICD-10-CM | POA: Diagnosis not present

## 2021-09-01 DIAGNOSIS — R52 Pain, unspecified: Secondary | ICD-10-CM | POA: Diagnosis not present

## 2021-09-01 DIAGNOSIS — E1140 Type 2 diabetes mellitus with diabetic neuropathy, unspecified: Secondary | ICD-10-CM | POA: Diagnosis present

## 2021-09-01 DIAGNOSIS — R627 Adult failure to thrive: Secondary | ICD-10-CM | POA: Diagnosis not present

## 2021-09-01 DIAGNOSIS — M2578 Osteophyte, vertebrae: Secondary | ICD-10-CM | POA: Diagnosis not present

## 2021-09-01 DIAGNOSIS — M48061 Spinal stenosis, lumbar region without neurogenic claudication: Secondary | ICD-10-CM | POA: Diagnosis not present

## 2021-09-01 DIAGNOSIS — R059 Cough, unspecified: Secondary | ICD-10-CM | POA: Diagnosis not present

## 2021-09-01 DIAGNOSIS — E43 Unspecified severe protein-calorie malnutrition: Secondary | ICD-10-CM | POA: Diagnosis not present

## 2021-09-01 DIAGNOSIS — E1165 Type 2 diabetes mellitus with hyperglycemia: Secondary | ICD-10-CM | POA: Diagnosis not present

## 2021-09-01 DIAGNOSIS — T380X5A Adverse effect of glucocorticoids and synthetic analogues, initial encounter: Secondary | ICD-10-CM | POA: Diagnosis not present

## 2021-09-01 DIAGNOSIS — J984 Other disorders of lung: Secondary | ICD-10-CM | POA: Diagnosis not present

## 2021-09-01 DIAGNOSIS — R64 Cachexia: Secondary | ICD-10-CM | POA: Diagnosis not present

## 2021-09-01 DIAGNOSIS — R0602 Shortness of breath: Secondary | ICD-10-CM | POA: Diagnosis not present

## 2021-09-01 DIAGNOSIS — I471 Supraventricular tachycardia: Secondary | ICD-10-CM | POA: Diagnosis present

## 2021-09-01 DIAGNOSIS — Z681 Body mass index (BMI) 19 or less, adult: Secondary | ICD-10-CM | POA: Diagnosis not present

## 2021-09-01 DIAGNOSIS — J9601 Acute respiratory failure with hypoxia: Secondary | ICD-10-CM | POA: Diagnosis not present

## 2021-09-01 DIAGNOSIS — E1169 Type 2 diabetes mellitus with other specified complication: Secondary | ICD-10-CM | POA: Diagnosis not present

## 2021-09-01 DIAGNOSIS — N183 Chronic kidney disease, stage 3 unspecified: Secondary | ICD-10-CM | POA: Diagnosis not present

## 2021-09-01 DIAGNOSIS — Z7189 Other specified counseling: Secondary | ICD-10-CM | POA: Diagnosis not present

## 2021-09-01 DIAGNOSIS — I129 Hypertensive chronic kidney disease with stage 1 through stage 4 chronic kidney disease, or unspecified chronic kidney disease: Secondary | ICD-10-CM | POA: Diagnosis not present

## 2021-09-01 DIAGNOSIS — E039 Hypothyroidism, unspecified: Secondary | ICD-10-CM | POA: Diagnosis present

## 2021-09-01 DIAGNOSIS — J440 Chronic obstructive pulmonary disease with acute lower respiratory infection: Secondary | ICD-10-CM | POA: Diagnosis present

## 2021-09-01 DIAGNOSIS — N179 Acute kidney failure, unspecified: Secondary | ICD-10-CM | POA: Diagnosis not present

## 2021-09-01 DIAGNOSIS — R531 Weakness: Secondary | ICD-10-CM | POA: Diagnosis not present

## 2021-09-01 LAB — URINALYSIS, ROUTINE W REFLEX MICROSCOPIC
Bilirubin Urine: NEGATIVE
Glucose, UA: NEGATIVE mg/dL
Hgb urine dipstick: NEGATIVE
Ketones, ur: NEGATIVE mg/dL
Leukocytes,Ua: NEGATIVE
Nitrite: NEGATIVE
Protein, ur: NEGATIVE mg/dL
Specific Gravity, Urine: 1.011 (ref 1.005–1.030)
pH: 6 (ref 5.0–8.0)

## 2021-09-01 LAB — BASIC METABOLIC PANEL
Anion gap: 10 (ref 5–15)
BUN: 37 mg/dL — ABNORMAL HIGH (ref 8–23)
CO2: 23 mmol/L (ref 22–32)
Calcium: 8.9 mg/dL (ref 8.9–10.3)
Chloride: 102 mmol/L (ref 98–111)
Creatinine, Ser: 1.62 mg/dL — ABNORMAL HIGH (ref 0.44–1.00)
GFR, Estimated: 30 mL/min — ABNORMAL LOW (ref >60)
Glucose, Bld: 109 mg/dL — ABNORMAL HIGH (ref 70–99)
Potassium: 3.9 mmol/L (ref 3.5–5.1)
Sodium: 135 mmol/L (ref 135–145)

## 2021-09-01 LAB — CBC WITH DIFFERENTIAL/PLATELET
Abs Immature Granulocytes: 0.01 10*3/uL (ref 0.00–0.07)
Basophils Absolute: 0 10*3/uL (ref 0.0–0.1)
Basophils Relative: 1 %
Eosinophils Absolute: 0.2 10*3/uL (ref 0.0–0.5)
Eosinophils Relative: 4 %
HCT: 35.8 % — ABNORMAL LOW (ref 36.0–46.0)
Hemoglobin: 11.8 g/dL — ABNORMAL LOW (ref 12.0–15.0)
Immature Granulocytes: 0 %
Lymphocytes Relative: 26 %
Lymphs Abs: 1.1 10*3/uL (ref 0.7–4.0)
MCH: 33.2 pg (ref 26.0–34.0)
MCHC: 33 g/dL (ref 30.0–36.0)
MCV: 100.8 fL — ABNORMAL HIGH (ref 80.0–100.0)
Monocytes Absolute: 0.5 10*3/uL (ref 0.1–1.0)
Monocytes Relative: 11 %
Neutro Abs: 2.6 10*3/uL (ref 1.7–7.7)
Neutrophils Relative %: 58 %
Platelets: 178 10*3/uL (ref 150–400)
RBC: 3.55 MIL/uL — ABNORMAL LOW (ref 3.87–5.11)
RDW: 14.2 % (ref 11.5–15.5)
WBC: 4.4 10*3/uL (ref 4.0–10.5)
nRBC: 0 % (ref 0.0–0.2)

## 2021-09-01 MED ORDER — MORPHINE SULFATE (PF) 4 MG/ML IV SOLN
4.0000 mg | Freq: Once | INTRAVENOUS | Status: AC
  Administered 2021-09-01: 4 mg via INTRAVENOUS
  Filled 2021-09-01: qty 1

## 2021-09-01 MED ORDER — ONDANSETRON HCL 4 MG/2ML IJ SOLN
4.0000 mg | Freq: Once | INTRAMUSCULAR | Status: AC
  Administered 2021-09-01: 4 mg via INTRAVENOUS
  Filled 2021-09-01: qty 2

## 2021-09-01 NOTE — ED Notes (Signed)
Patient transported to CT 

## 2021-09-01 NOTE — ED Notes (Addendum)
Daughter called for transportation at this time. States she will be here in 15 minutes.

## 2021-09-01 NOTE — ED Triage Notes (Signed)
Pt BIB EMS from home with c/o lower back pain x 3 hours. States she was laying in the bed when it started. Pt has hx of arthritis. Pt took a hydrocodone about 0430, states it is starting to help. Pt recently dx with UTI and given abx last week.

## 2021-09-01 NOTE — Discharge Instructions (Signed)
Continue taking hydrocodone as previously prescribed as needed for pain.  Follow-up with primary doctor in the next week, and return to the ER if symptoms significantly worsen or change.

## 2021-09-01 NOTE — ED Provider Notes (Signed)
[facility] Provider Note   CSN: [id number] Arrival date & time: 09/01/21  8299     History  Chief Complaint  Patient presents with   Back Pain    Debra [name] is a 88 y.o. female.  Patient is a 88 year old female with past medical history of hypertension, chronic renal insufficiency, coronary artery disease, CHF, diabetes, arthritis.  Patient presenting today for evaluation of low back pain.  Patient woke from sleep, then turned in bed and noticed pain in her low back.  This is worse when she attempts to sit up or move.  She took a hydrocodone at home, but achieved little relief.  She denies any radiation into her legs.  She denies any bowel or bladder complaints.  The history is provided by the patient.  Back Pain Location:  Lumbar spine Quality:  Stabbing Radiates to:  Does not radiate Pain severity:  Severe Onset quality:  Sudden Duration:  3 hours Timing:  Constant Progression:  Unchanged Chronicity:  New Relieved by:  Nothing Worsened by:  Movement     Home Medications Prior to Admission medications   Medication Sig Start Date End Date Taking? Authorizing Provider  acetaminophen (TYLENOL) 325 MG tablet Take 650 mg by mouth every 6 (six) hours as needed for mild pain.    [provider]  albuterol (VENTOLIN HFA) 108 (90 Base) MCG/ACT inhaler Inhale 1 puff into the lungs every 6 (six) hours as needed for shortness of breath or wheezing.    [provider]  amiodarone (PACERONE) 200 MG tablet Take 100 mg by mouth daily. 08/30/20   [provider]  apixaban (ELIQUIS) 2.5 MG TABS tablet Take 1 tablet (2.5 mg total) by mouth 2 (two) times daily. 07/21/21   [name], MD  atorvastatin (LIPITOR) 80 MG tablet Take 1 tablet (80 mg total) by mouth daily at 6 PM. 12/31/17   Strader, Fransisco Hertz, PA-C  Cholecalciferol (VITAMIN D) 125 MCG (5000 UT) CAPS Take 1 capsule by mouth daily.    [provider]  diazepam  (VALIUM) 2 MG tablet Take 2 mg by mouth daily as needed. 06/21/19   [provider]  furosemide (LASIX) 20 MG tablet Take 1 tablet (20 mg total) by mouth daily. 03/19/21   Isla Pence, MD  gabapentin (NEURONTIN) 100 MG capsule Take 100 mg by mouth 3 (three) times daily.  03/09/18   [provider]  HYDROcodone-acetaminophen (NORCO) 10-325 MG tablet Take 1 tablet by mouth every 6 (six) hours as needed for moderate pain or severe pain. 1 tab po hs/ and prn.    [provider]  levothyroxine (SYNTHROID) 50 MCG tablet Take 1 tablet (50 mcg total) by mouth daily at 6 (six) AM. 08/03/19   Georgette Shell, MD  Menthol, Topical Analgesic, (ASPERCREME MAX ROLL-ON) 16 % LIQD Apply 1 application topically as needed.    [provider]  polyethylene glycol (MIRALAX / GLYCOLAX) 17 g packet Take 17 g by mouth daily as needed for mild constipation. 08/02/19   Georgette Shell, MD  sacubitril-valsartan (ENTRESTO) 49-51 MG Take 1 tablet by mouth 2 (two) times daily. 05/21/21   [name], MD      Allergies    Ciprofloxacin, Iodine, Zolpidem, and Iodinated contrast media    Review of Systems   Review of Systems  Musculoskeletal:  Positive for back pain.  All other systems reviewed and are negative.  Physical Exam Updated Vital Signs BP (!) 185/73  Pulse (!) 53    Temp 98.1 F (36.7 C)    Resp 14    Ht 5\' 7"  (1.702 m)    Wt 49.4 kg    SpO2 100%    BMI 17.07 kg/m  Physical Exam Vitals and nursing note reviewed.  Constitutional:      General: She is not in acute distress.    Appearance: Normal appearance. She is well-developed. She is not ill-appearing or diaphoretic.  HENT:     Head: Normocephalic and atraumatic.  Cardiovascular:     Rate and Rhythm: Normal rate and regular rhythm.     Heart sounds: No murmur heard.   No friction rub. No gallop.  Pulmonary:     Effort: Pulmonary effort is normal. No respiratory distress.     Breath sounds: Normal  breath sounds. No wheezing.  Abdominal:     General: Bowel sounds are normal. There is no distension.     Palpations: Abdomen is soft.     Tenderness: There is no abdominal tenderness.  Musculoskeletal:        General: Normal range of motion.     Cervical back: Normal range of motion and neck supple.     Comments: There is tenderness to palpation in the soft tissues of the lumbar region.  There is no bony tenderness or step-off.  Skin:    General: Skin is warm and dry.  Neurological:     General: No focal deficit present.     Mental Status: She is alert and oriented to person, place, and time.     Comments: Strength is 5 out of 5 in both lower extremities.  DTRs are 1+ and symmetrical in the patellar and Achilles tendons bilaterally.  Sensation is intact to both lower extremities.    ED Results / Procedures / Treatments   Labs (all labs ordered are listed, but only abnormal results are displayed) Labs Reviewed  BASIC METABOLIC PANEL  CBC WITH DIFFERENTIAL/PLATELET  URINALYSIS, ROUTINE W REFLEX MICROSCOPIC    EKG EKG Interpretation  Date/Time:  Tuesday September 01 2021 05:23:45 EST Ventricular Rate:  55 PR Interval:  169 QRS Duration: 110 QT Interval:  463 QTC Calculation: 443 R Axis:   93 Text Interpretation: Sinus rhythm Right axis deviation Otherwise normal ECG Confirmed by Veryl Speak [phone]) on 09/01/2021 5:48:27 AM  Radiology No results found.  Procedures Procedures    Medications Ordered in ED Medications  morphine (PF) 4 MG/ML injection 4 mg (has no administration in time range)  ondansetron (ZOFRAN) injection 4 mg (has no administration in time range)    ED Course/ Medical Decision Making/ A&P  Patient brought by EMS for evaluation of low back pain.  This started acutely this evening after turning awkwardly in bed.  Patient appears neurologically intact with no bowel or bladder complaints.  Patient to undergo CT scan for rule out of compression fracture.   Urinalysis also pending.  Care will be signed out to oncoming provider at shift change.  Dr. Kathrynn Humble will obtain the results of the studies and determine the final disposition.  Final Clinical Impression(s) / ED Diagnoses Final diagnoses:  None    Rx / DC Orders ED Discharge Orders     None         Veryl Speak, MD 09/04/21 2305

## 2021-09-01 NOTE — ED Notes (Signed)
Pt and daughter verbalized understanding of discharge paperwork.

## 2021-09-03 ENCOUNTER — Emergency Department (HOSPITAL_COMMUNITY): Payer: Medicare HMO

## 2021-09-03 ENCOUNTER — Inpatient Hospital Stay (HOSPITAL_COMMUNITY)
Admission: EM | Admit: 2021-09-03 | Discharge: 2021-09-10 | DRG: 193 | Disposition: A | Discharge status: Home Health | Payer: Medicare HMO | Attending: Internal Medicine | Admitting: Internal Medicine

## 2021-09-03 ENCOUNTER — Encounter (HOSPITAL_COMMUNITY): Payer: Self-pay | Admitting: Emergency Medicine

## 2021-09-03 ENCOUNTER — Other Ambulatory Visit: Payer: Self-pay

## 2021-09-03 DIAGNOSIS — M109 Gout, unspecified: Secondary | ICD-10-CM | POA: Diagnosis present

## 2021-09-03 DIAGNOSIS — N179 Acute kidney failure, unspecified: Secondary | ICD-10-CM | POA: Diagnosis present

## 2021-09-03 DIAGNOSIS — Z515 Encounter for palliative care: Secondary | ICD-10-CM

## 2021-09-03 DIAGNOSIS — I10 Essential (primary) hypertension: Secondary | ICD-10-CM | POA: Diagnosis present

## 2021-09-03 DIAGNOSIS — I700 Atherosclerosis of aorta: Secondary | ICD-10-CM | POA: Diagnosis not present

## 2021-09-03 DIAGNOSIS — I2510 Atherosclerotic heart disease of native coronary artery without angina pectoris: Secondary | ICD-10-CM | POA: Diagnosis present

## 2021-09-03 DIAGNOSIS — R64 Cachexia: Secondary | ICD-10-CM | POA: Diagnosis present

## 2021-09-03 DIAGNOSIS — J189 Pneumonia, unspecified organism: Principal | ICD-10-CM

## 2021-09-03 DIAGNOSIS — Z20822 Contact with and (suspected) exposure to covid-19: Secondary | ICD-10-CM | POA: Diagnosis present

## 2021-09-03 DIAGNOSIS — R0600 Dyspnea, unspecified: Secondary | ICD-10-CM | POA: Diagnosis not present

## 2021-09-03 DIAGNOSIS — E43 Unspecified severe protein-calorie malnutrition: Secondary | ICD-10-CM | POA: Insufficient documentation

## 2021-09-03 DIAGNOSIS — M2578 Osteophyte, vertebrae: Secondary | ICD-10-CM | POA: Diagnosis not present

## 2021-09-03 DIAGNOSIS — R0602 Shortness of breath: Secondary | ICD-10-CM | POA: Diagnosis not present

## 2021-09-03 DIAGNOSIS — Z951 Presence of aortocoronary bypass graft: Secondary | ICD-10-CM

## 2021-09-03 DIAGNOSIS — Z881 Allergy status to other antibiotic agents status: Secondary | ICD-10-CM

## 2021-09-03 DIAGNOSIS — J9601 Acute respiratory failure with hypoxia: Principal | ICD-10-CM

## 2021-09-03 DIAGNOSIS — Z800 Family history of malignant neoplasm of digestive organs: Secondary | ICD-10-CM

## 2021-09-03 DIAGNOSIS — E1122 Type 2 diabetes mellitus with diabetic chronic kidney disease: Secondary | ICD-10-CM | POA: Diagnosis present

## 2021-09-03 DIAGNOSIS — Z91041 Radiographic dye allergy status: Secondary | ICD-10-CM

## 2021-09-03 DIAGNOSIS — E039 Hypothyroidism, unspecified: Secondary | ICD-10-CM | POA: Diagnosis present

## 2021-09-03 DIAGNOSIS — J45909 Unspecified asthma, uncomplicated: Secondary | ICD-10-CM | POA: Diagnosis present

## 2021-09-03 DIAGNOSIS — R627 Adult failure to thrive: Secondary | ICD-10-CM | POA: Diagnosis present

## 2021-09-03 DIAGNOSIS — I255 Ischemic cardiomyopathy: Secondary | ICD-10-CM | POA: Diagnosis present

## 2021-09-03 DIAGNOSIS — Z7901 Long term (current) use of anticoagulants: Secondary | ICD-10-CM

## 2021-09-03 DIAGNOSIS — E860 Dehydration: Secondary | ICD-10-CM | POA: Diagnosis present

## 2021-09-03 DIAGNOSIS — N183 Chronic kidney disease, stage 3 unspecified: Secondary | ICD-10-CM | POA: Diagnosis present

## 2021-09-03 DIAGNOSIS — Z85038 Personal history of other malignant neoplasm of large intestine: Secondary | ICD-10-CM

## 2021-09-03 DIAGNOSIS — R778 Other specified abnormalities of plasma proteins: Secondary | ICD-10-CM

## 2021-09-03 DIAGNOSIS — N184 Chronic kidney disease, stage 4 (severe): Secondary | ICD-10-CM | POA: Diagnosis present

## 2021-09-03 DIAGNOSIS — K219 Gastro-esophageal reflux disease without esophagitis: Secondary | ICD-10-CM | POA: Diagnosis present

## 2021-09-03 DIAGNOSIS — R54 Age-related physical debility: Secondary | ICD-10-CM | POA: Diagnosis present

## 2021-09-03 DIAGNOSIS — Z8249 Family history of ischemic heart disease and other diseases of the circulatory system: Secondary | ICD-10-CM

## 2021-09-03 DIAGNOSIS — R059 Cough, unspecified: Secondary | ICD-10-CM | POA: Diagnosis not present

## 2021-09-03 DIAGNOSIS — I130 Hypertensive heart and chronic kidney disease with heart failure and stage 1 through stage 4 chronic kidney disease, or unspecified chronic kidney disease: Secondary | ICD-10-CM | POA: Diagnosis present

## 2021-09-03 DIAGNOSIS — T380X5A Adverse effect of glucocorticoids and synthetic analogues, initial encounter: Secondary | ICD-10-CM | POA: Diagnosis not present

## 2021-09-03 DIAGNOSIS — Z66 Do not resuscitate: Secondary | ICD-10-CM | POA: Diagnosis not present

## 2021-09-03 DIAGNOSIS — I248 Other forms of acute ischemic heart disease: Secondary | ICD-10-CM | POA: Diagnosis present

## 2021-09-03 DIAGNOSIS — Z79890 Hormone replacement therapy (postmenopausal): Secondary | ICD-10-CM

## 2021-09-03 DIAGNOSIS — Z79899 Other long term (current) drug therapy: Secondary | ICD-10-CM

## 2021-09-03 DIAGNOSIS — R7989 Other specified abnormal findings of blood chemistry: Secondary | ICD-10-CM

## 2021-09-03 DIAGNOSIS — J440 Chronic obstructive pulmonary disease with acute lower respiratory infection: Secondary | ICD-10-CM | POA: Diagnosis present

## 2021-09-03 DIAGNOSIS — I5022 Chronic systolic (congestive) heart failure: Secondary | ICD-10-CM | POA: Diagnosis present

## 2021-09-03 DIAGNOSIS — I480 Paroxysmal atrial fibrillation: Secondary | ICD-10-CM | POA: Diagnosis present

## 2021-09-03 DIAGNOSIS — J984 Other disorders of lung: Secondary | ICD-10-CM | POA: Diagnosis not present

## 2021-09-03 DIAGNOSIS — Z803 Family history of malignant neoplasm of breast: Secondary | ICD-10-CM

## 2021-09-03 DIAGNOSIS — E119 Type 2 diabetes mellitus without complications: Secondary | ICD-10-CM

## 2021-09-03 DIAGNOSIS — I471 Supraventricular tachycardia: Secondary | ICD-10-CM | POA: Diagnosis present

## 2021-09-03 DIAGNOSIS — Z87891 Personal history of nicotine dependence: Secondary | ICD-10-CM

## 2021-09-03 DIAGNOSIS — Z681 Body mass index (BMI) 19 or less, adult: Secondary | ICD-10-CM

## 2021-09-03 DIAGNOSIS — E1165 Type 2 diabetes mellitus with hyperglycemia: Secondary | ICD-10-CM | POA: Diagnosis not present

## 2021-09-03 DIAGNOSIS — M1990 Unspecified osteoarthritis, unspecified site: Secondary | ICD-10-CM | POA: Diagnosis present

## 2021-09-03 DIAGNOSIS — I252 Old myocardial infarction: Secondary | ICD-10-CM

## 2021-09-03 DIAGNOSIS — Z888 Allergy status to other drugs, medicaments and biological substances status: Secondary | ICD-10-CM

## 2021-09-03 DIAGNOSIS — E1140 Type 2 diabetes mellitus with diabetic neuropathy, unspecified: Secondary | ICD-10-CM | POA: Diagnosis present

## 2021-09-03 LAB — HEPATIC FUNCTION PANEL
ALT: 14 U/L (ref 0–44)
AST: 21 U/L (ref 15–41)
Albumin: 3.4 g/dL — ABNORMAL LOW (ref 3.5–5.0)
Alkaline Phosphatase: 73 U/L (ref 38–126)
Bilirubin, Direct: 0.2 mg/dL (ref 0.0–0.2)
Indirect Bilirubin: 0.4 mg/dL (ref 0.3–0.9)
Total Bilirubin: 0.6 mg/dL (ref 0.3–1.2)
Total Protein: 6.8 g/dL (ref 6.5–8.1)

## 2021-09-03 LAB — CBC
HCT: 35.9 % — ABNORMAL LOW (ref 36.0–46.0)
Hemoglobin: 11.6 g/dL — ABNORMAL LOW (ref 12.0–15.0)
MCH: 33.1 pg (ref 26.0–34.0)
MCHC: 32.3 g/dL (ref 30.0–36.0)
MCV: 102.6 fL — ABNORMAL HIGH (ref 80.0–100.0)
Platelets: 177 10*3/uL (ref 150–400)
RBC: 3.5 MIL/uL — ABNORMAL LOW (ref 3.87–5.11)
RDW: 14.3 % (ref 11.5–15.5)
WBC: 12.8 10*3/uL — ABNORMAL HIGH (ref 4.0–10.5)
nRBC: 0 % (ref 0.0–0.2)

## 2021-09-03 LAB — BLOOD GAS, ARTERIAL
Acid-Base Excess: 5.3 mmol/L — ABNORMAL HIGH (ref 0.0–2.0)
Bicarbonate: 29 mmol/L — ABNORMAL HIGH (ref 20.0–28.0)
Drawn by: 27016
FIO2: 28 %
O2 Saturation: 98.7 %
Patient temperature: 37
pCO2 arterial: 38 mmHg (ref 32–48)
pH, Arterial: 7.49 — ABNORMAL HIGH (ref 7.35–7.45)
pO2, Arterial: 114 mmHg — ABNORMAL HIGH (ref 83–108)

## 2021-09-03 LAB — BASIC METABOLIC PANEL
Anion gap: 9 (ref 5–15)
BUN: 35 mg/dL — ABNORMAL HIGH (ref 8–23)
CO2: 26 mmol/L (ref 22–32)
Calcium: 8.6 mg/dL — ABNORMAL LOW (ref 8.9–10.3)
Chloride: 100 mmol/L (ref 98–111)
Creatinine, Ser: 1.65 mg/dL — ABNORMAL HIGH (ref 0.44–1.00)
GFR, Estimated: 29 mL/min — ABNORMAL LOW (ref >60)
Glucose, Bld: 144 mg/dL — ABNORMAL HIGH (ref 70–99)
Potassium: 3.6 mmol/L (ref 3.5–5.1)
Sodium: 135 mmol/L (ref 135–145)

## 2021-09-03 LAB — RESP PANEL BY RT-PCR (FLU A&B, COVID) ARPGX2
Influenza A by PCR: NEGATIVE
Influenza B by PCR: NEGATIVE
SARS Coronavirus 2 by RT PCR: NEGATIVE

## 2021-09-03 LAB — TROPONIN I (HIGH SENSITIVITY)
Troponin I (High Sensitivity): 184 ng/L (ref <18)
Troponin I (High Sensitivity): 222 ng/L (ref <18)

## 2021-09-03 LAB — LACTIC ACID, PLASMA
Lactic Acid, Venous: 1.2 mmol/L (ref 0.5–1.9)
Lactic Acid, Venous: 1.3 mmol/L (ref 0.5–1.9)

## 2021-09-03 MED ORDER — ACETAMINOPHEN 650 MG RE SUPP: 650.0000 mg | Freq: Four times a day (QID) | RECTAL | Status: DC | PRN

## 2021-09-03 MED ORDER — AZITHROMYCIN 500 MG IV SOLR
500.0000 mg | INTRAVENOUS | Status: DC
  Administered 2021-09-03 – 2021-09-05 (×3): 500 mg via INTRAVENOUS
  Filled 2021-09-03 (×3): qty 5

## 2021-09-03 MED ORDER — ONDANSETRON HCL 4 MG PO TABS
4.0000 mg | ORAL_TABLET | Freq: Four times a day (QID) | ORAL | Status: DC | PRN
Start: 2021-09-03 — End: 2021-09-10

## 2021-09-03 MED ORDER — CEFTRIAXONE SODIUM 2 G IJ SOLR
2.0000 g | Freq: Once | INTRAMUSCULAR | Status: AC
  Administered 2021-09-03: 2 g via INTRAVENOUS
  Filled 2021-09-03: qty 20

## 2021-09-03 MED ORDER — SACUBITRIL-VALSARTAN 49-51 MG PO TABS
1.0000 | ORAL_TABLET | Freq: Two times a day (BID) | ORAL | Status: DC
  Administered 2021-09-03 – 2021-09-05 (×5): 1 via ORAL
  Filled 2021-09-03 (×6): qty 1

## 2021-09-03 MED ORDER — LEVOTHYROXINE SODIUM 25 MCG PO TABS
125.0000 ug | ORAL_TABLET | Freq: Every day | ORAL | Status: DC
  Administered 2021-09-04 – 2021-09-10 (×7): 125 ug via ORAL
  Filled 2021-09-03 (×8): qty 1

## 2021-09-03 MED ORDER — GABAPENTIN 100 MG PO CAPS
100.0000 mg | ORAL_CAPSULE | Freq: Three times a day (TID) | ORAL | Status: DC
Start: 2021-09-03 — End: 2021-09-10
  Administered 2021-09-03 – 2021-09-10 (×20): 100 mg via ORAL
  Filled 2021-09-03 (×20): qty 1

## 2021-09-03 MED ORDER — ALBUTEROL SULFATE (2.5 MG/3ML) 0.083% IN NEBU
2.5000 mg | INHALATION_SOLUTION | RESPIRATORY_TRACT | Status: DC | PRN
  Administered 2021-09-05: 2.5 mg via RESPIRATORY_TRACT
  Filled 2021-09-03: qty 3

## 2021-09-03 MED ORDER — ATORVASTATIN CALCIUM 40 MG PO TABS
80.0000 mg | ORAL_TABLET | Freq: Every day | ORAL | Status: DC
Start: 2021-09-04 — End: 2021-09-07
  Administered 2021-09-04 – 2021-09-06 (×3): 80 mg via ORAL
  Filled 2021-09-03 (×3): qty 2

## 2021-09-03 MED ORDER — ONDANSETRON HCL 4 MG/2ML IJ SOLN
4.0000 mg | Freq: Four times a day (QID) | INTRAMUSCULAR | Status: DC | PRN
  Administered 2021-09-06: 4 mg via INTRAVENOUS

## 2021-09-03 MED ORDER — APIXABAN 2.5 MG PO TABS
2.5000 mg | ORAL_TABLET | Freq: Two times a day (BID) | ORAL | Status: DC
  Administered 2021-09-03 – 2021-09-10 (×14): 2.5 mg via ORAL
  Filled 2021-09-03 (×14): qty 1

## 2021-09-03 MED ORDER — AMIODARONE HCL 200 MG PO TABS
100.0000 mg | ORAL_TABLET | Freq: Every day | ORAL | Status: DC
  Administered 2021-09-04 – 2021-09-10 (×7): 100 mg via ORAL
  Filled 2021-09-03 (×7): qty 1

## 2021-09-03 MED ORDER — CEFTRIAXONE SODIUM 2 G IJ SOLR
2.0000 g | INTRAMUSCULAR | Status: DC
  Administered 2021-09-04 – 2021-09-07 (×4): 2 g via INTRAVENOUS
  Filled 2021-09-03 (×4): qty 20

## 2021-09-03 MED ORDER — DIAZEPAM 2 MG PO TABS
2.0000 mg | ORAL_TABLET | Freq: Every day | ORAL | Status: DC | PRN
  Administered 2021-09-04: 2 mg via ORAL
  Filled 2021-09-03: qty 1

## 2021-09-03 MED ORDER — ACETAMINOPHEN 325 MG PO TABS
650.0000 mg | ORAL_TABLET | Freq: Four times a day (QID) | ORAL | Status: DC | PRN
  Administered 2021-09-04 – 2021-09-06 (×3): 650 mg via ORAL
  Filled 2021-09-03 (×3): qty 2

## 2021-09-03 MED ORDER — SODIUM CHLORIDE 0.9 % IV BOLUS
500.0000 mL | Freq: Once | INTRAVENOUS | Status: AC
  Administered 2021-09-03: 500 mL via INTRAVENOUS

## 2021-09-03 MED ORDER — VITAMIN D 25 MCG (1000 UNIT) PO TABS
5000.0000 [IU] | ORAL_TABLET | Freq: Every day | ORAL | Status: DC
  Administered 2021-09-04 – 2021-09-10 (×7): 5000 [IU] via ORAL
  Filled 2021-09-03 (×7): qty 5

## 2021-09-03 MED ORDER — GUAIFENESIN ER 600 MG PO TB12
600.0000 mg | ORAL_TABLET | Freq: Two times a day (BID) | ORAL | Status: DC
  Administered 2021-09-03 – 2021-09-04 (×3): 600 mg via ORAL
  Filled 2021-09-03 (×4): qty 1

## 2021-09-03 MED ORDER — BENZONATATE 100 MG PO CAPS
200.0000 mg | ORAL_CAPSULE | Freq: Two times a day (BID) | ORAL | Status: DC
  Administered 2021-09-03 – 2021-09-04 (×3): 200 mg via ORAL
  Filled 2021-09-03 (×4): qty 2

## 2021-09-03 MED ORDER — ALBUTEROL SULFATE HFA 108 (90 BASE) MCG/ACT IN AERS: 2.0000 | INHALATION_SPRAY | RESPIRATORY_TRACT | Status: DC | PRN

## 2021-09-03 MED ORDER — ASPIRIN 325 MG PO TABS
325.0000 mg | ORAL_TABLET | Freq: Once | ORAL | Status: AC
  Administered 2021-09-03: 325 mg via ORAL
  Filled 2021-09-03: qty 1

## 2021-09-03 MED ORDER — METHOCARBAMOL 1000 MG/10ML IJ SOLN
500.0000 mg | Freq: Four times a day (QID) | INTRAVENOUS | Status: DC | PRN
  Filled 2021-09-03: qty 5

## 2021-09-03 NOTE — ED Provider Notes (Addendum)
[facility] Provider Note   CSN: [id number] Arrival date & time: 09/03/21  1330     History  Chief Complaint  Patient presents with   Cough    Debra [name] is a 88 y.o. female.  HPI  Patient with medical history including hypertension, CKD stage II, CAD, CHF EF of 40% diabetes, A-fib currently on Eliquis presents  with complaints of a cough.  Patient states that her cough has been going on for about a week's time, states that it is a productive cough,  yellow like sputum, no associated fevers or chills no congestion, no shortness of breath, states that she feels generalized fatigue non unilateral, no change in vision headaches numbness or tingling in the arms or legs, no stomach pains nausea vomit diarrhea, states she still tolerating p.o., no urinary symptoms.  Patient has other complaints at this time.  States she is compliant with all medications.  I have reviewed patient's chart was seen here 4 days ago for back pain, she had a benign work-up and was discharged home.  Home Medications Prior to Admission medications   Medication Sig Start Date End Date Taking? Authorizing Provider  acetaminophen (TYLENOL) 325 MG tablet Take 650 mg by mouth every 6 (six) hours as needed for mild pain.    [provider]  albuterol (VENTOLIN HFA) 108 (90 Base) MCG/ACT inhaler Inhale 1 puff into the lungs every 6 (six) hours as needed for shortness of breath or wheezing.    [provider]  amiodarone (PACERONE) 200 MG tablet Take 100 mg by mouth daily. 08/30/20   [provider]  apixaban (ELIQUIS) 2.5 MG TABS tablet Take 1 tablet (2.5 mg total) by mouth 2 (two) times daily. 07/21/21   [name], MD  atorvastatin (LIPITOR) 80 MG tablet Take 1 tablet (80 mg total) by mouth daily at 6 PM. 12/31/17   Strader, Fransisco Hertz, PA-C  azithromycin (ZITHROMAX) 250 MG tablet Take 250 mg by mouth daily. 08/31/21   [provider]  benzonatate (TESSALON)  100 MG capsule Take 200 mg by mouth 3 (three) times daily. 08/31/21   [provider]  cephALEXin (KEFLEX) 500 MG capsule Take 500 mg by mouth every 12 (twelve) hours. 08/26/21   [provider]  Cholecalciferol (VITAMIN D) 125 MCG (5000 UT) CAPS Take 1 capsule by mouth daily.    [provider]  diazepam (VALIUM) 2 MG tablet Take 2 mg by mouth daily as needed. 06/21/19   [provider]  furosemide (LASIX) 20 MG tablet Take 1 tablet (20 mg total) by mouth daily. 03/19/21   Isla Pence, MD  gabapentin (NEURONTIN) 100 MG capsule Take 100 mg by mouth 3 (three) times daily.  03/09/18   [provider]  gabapentin (NEURONTIN) 300 MG capsule Take 300 mg by mouth 3 (three) times daily. 07/22/21   [provider]  HYDROcodone-acetaminophen (NORCO) 10-325 MG tablet Take 1 tablet by mouth every 6 (six) hours as needed for moderate pain or severe pain. 1 tab po hs/ and prn.    [provider]  LANTUS SOLOSTAR 100 UNIT/ML Solostar Pen Inject into the skin. 09/03/21   [provider]  levothyroxine (SYNTHROID) 50 MCG tablet Take 1 tablet (50 mcg total) by mouth daily at 6 (six) AM. 08/03/19   Georgette Shell, MD  Menthol, Topical Analgesic, (ASPERCREME MAX ROLL-ON) 16 % LIQD Apply 1 application topically as needed.    [provider]  polyethylene glycol (MIRALAX /  GLYCOLAX) 17 g packet Take 17 g by mouth daily as needed for mild constipation. 08/02/19   Georgette Shell, MD  sacubitril-valsartan (ENTRESTO) 49-51 MG Take 1 tablet by mouth 2 (two) times daily. 05/21/21   [name], MD      Allergies    Ciprofloxacin, Iodine, Zolpidem, and Iodinated contrast media    Review of Systems   Review of Systems  Constitutional:  Negative for chills and fever.  Respiratory:  Positive for cough. Negative for shortness of breath.   Cardiovascular:  Negative for chest pain.  Gastrointestinal:  Negative for abdominal pain.   Neurological:  Positive for weakness. Negative for headaches.   Physical Exam Updated Vital Signs BP 104/90    Pulse 61    Temp 98.8 F (37.1 C) (Oral)    Resp 18    Ht 5\' 7"  (1.702 m)    Wt 49.4 kg    SpO2 100%    BMI 17.06 kg/m  Physical Exam Vitals and nursing note reviewed.  Constitutional:      General: She is not in acute distress.    Appearance: She is not ill-appearing.  HENT:     Head: Normocephalic and atraumatic.     Nose: No congestion.     Mouth/Throat:     Mouth: Mucous membranes are moist.     Pharynx: Oropharynx is clear. No oropharyngeal exudate or posterior oropharyngeal erythema.  Eyes:     Conjunctiva/sclera: Conjunctivae normal.  Cardiovascular:     Rate and Rhythm: Normal rate and regular rhythm.     Pulses: Normal pulses.     Heart sounds: No murmur heard.   No friction rub. No gallop.  Pulmonary:     Effort: No respiratory distress.     Breath sounds: Rhonchi present. No wheezing or rales.     Comments: Patient showed no signs of respiratory distress, she is nontachypneic, nonhypoxic, able speak in full sentences, patient has coarse sounding lung sounds notable rhonchi heard bilaterally, worse on the right versus the left, no rales stridor or wheezing present. Abdominal:     Palpations: Abdomen is soft.     Tenderness: There is no abdominal tenderness. There is no right CVA tenderness or left CVA tenderness.  Musculoskeletal:     Right lower leg: No edema.     Left lower leg: No edema.  Skin:    General: Skin is warm and dry.  Neurological:     Mental Status: She is alert.  Psychiatric:        Mood and Affect: Mood normal.    ED Results / Procedures / Treatments   Labs (all labs ordered are listed, but only abnormal results are displayed) Labs Reviewed  BASIC METABOLIC PANEL - Abnormal; Notable for the following components:      Result Value   Glucose, Bld 144 (*)    BUN 35 (*)    Creatinine, Ser 1.65 (*)    Calcium 8.6 (*)    GFR,  Estimated 29 (*)    All other components within normal limits  CBC - Abnormal; Notable for the following components:   WBC 12.8 (*)    RBC 3.50 (*)    Hemoglobin 11.6 (*)    HCT 35.9 (*)    MCV 102.6 (*)    All other components within normal limits  HEPATIC FUNCTION PANEL - Abnormal; Notable for the following components:   Albumin 3.4 (*)    All other components within normal limits  BLOOD GAS,  ARTERIAL - Abnormal; Notable for the following components:   pH, Arterial 7.49 (*)    pO2, Arterial 114 (*)    Bicarbonate 29.0 (*)    Acid-Base Excess 5.3 (*)    All other components within normal limits  TROPONIN I (HIGH SENSITIVITY) - Abnormal; Notable for the following components:   Troponin I (High Sensitivity) 184 (*)    All other components within normal limits  TROPONIN I (HIGH SENSITIVITY) - Abnormal; Notable for the following components:   Troponin I (High Sensitivity) 222 (*)    All other components within normal limits  CULTURE, BLOOD (ROUTINE X 2)  CULTURE, BLOOD (ROUTINE X 2)  RESP PANEL BY RT-PCR (FLU A&B, COVID) ARPGX2  LACTIC ACID, PLASMA  LACTIC ACID, PLASMA  URINALYSIS, ROUTINE W REFLEX MICROSCOPIC    EKG None  Radiology DG Chest Portable 1 View  Result Date: 09/03/2021 CLINICAL DATA:  Cough, shortness of breath. EXAM: PORTABLE CHEST 1 VIEW COMPARISON:  March 19, 2021. FINDINGS: Stable cardiomediastinal silhouette. Sternotomy wires are noted. Postoperative changes are noted in the right upper lobe. No acute pulmonary disease is noted. Bony thorax is unremarkable. IMPRESSION: No active disease. Electronically Signed   By: Marijo Conception M.D.   On: 09/03/2021 14:17    Procedures .Critical Care Performed by: Marcello Fennel, PA-C Authorized by: Marcello Fennel, PA-C   Critical care provider statement:    Critical care time (minutes):  30   Critical care time was exclusive of:  Separately billable procedures and treating other patients   Critical care  was necessary to treat or prevent imminent or life-threatening deterioration of the following conditions:  Respiratory failure   Critical care was time spent personally by me on the following activities:  Evaluation of patient's response to treatment, examination of patient, ordering and review of laboratory studies, ordering and review of radiographic studies, ordering and performing treatments and interventions, pulse oximetry, re-evaluation of patient's condition, review of old charts and discussions with consultants   I assumed direction of critical care for this patient from another provider in my specialty: no     Care discussed with: admitting provider      Medications Ordered in ED Medications  albuterol (VENTOLIN HFA) 108 (90 Base) MCG/ACT inhaler 2 puff (has no administration in time range)  cefTRIAXone (ROCEPHIN) 2 g in sodium chloride 0.9 % 100 mL IVPB (0 g Intravenous Stopped 09/03/21 1644)  aspirin tablet 325 mg (325 mg Oral Given 09/03/21 1541)  sodium chloride 0.9 % bolus 500 mL (0 mLs Intravenous Stopped 09/03/21 1644)    ED Course/ Medical Decision Making/ A&P                           Medical Decision Making Amount and/or Complexity of Data Reviewed Labs: ordered. Radiology: ordered.  Risk OTC drugs. Prescription drug management. Decision regarding hospitalization.   This patient presents to the ED for concern of cough, this involves an extensive number of treatment options, and is a complaint that carries with it a high risk of complications and morbidity.  The differential diagnosis includes pneumonia, sepsis, PE    Additional history obtained:  Additional history obtained from electronic medical record External records from outside source obtained and reviewed including please see HPI   Co morbidities that complicate the patient evaluation  A-fib, CAD, diabetes  Social Determinants of Health:  Advanced age    Lab Tests:  I Ordered, and personally  interpreted  labs.  The pertinent results include: CBC shows leukocytosis of 12.8, macrocytic anemia  hemoglobin 11.6, hepatic panel unremarkable, respiratory panel unremarkable, BMP shows glucose of 144 BUN 35 creatinine 1.65 GFR 29, arterial blood gas pH of 7.49 PCO2 of 114 bicarb 29 lactic 1.3, first troponin is 186-second troponin 222   Imaging Studies ordered:  I ordered imaging studies including chest x-ray I independently visualized and interpreted imaging which showed negative acute findings I agree with the radiologist interpretation   Cardiac Monitoring:  The patient was maintained on a cardiac monitor.  I personally viewed and interpreted the cardiac monitored which showed an underlying rhythm of: Sinus without signs of ischemia   Medicines ordered and prescription drug management:  I ordered medication including fluids, antibiotics for pneumonia I have reviewed the patients home medicines and have made adjustments as needed   Reevaluation:  Patient presented with shortness of breath, exam consistent with likely pneumonia, will obtain chest x-ray started on fluids and antibiotics.  Patient's first troponin is elevated 186, reviewed EKG does not show signs of ischemia, she has no chest pain this time no pleuritic chest pain she is requiring oxygen she is placed on 2 L, she is found resting comfortably.  Second opponent slightly elevated at 222 suspect this more demand ischemia we will consult with cardiology for further recommendations.  Updated patient as well as daughter on recommendations they are agreement to plan and will consult hospitalist for admission.  Consultations Obtained:  I requested consultation with the cardiologist Dr.chandrasekhar,  and discussed lab and imaging findings as well as pertinent plan - they recommend: He does not feel this ACS related, she has significant cardiac disease and suspects any stress will elevate her troponins.  Recommends trends if  worsen they can be consulted for reevaluation Spoke with Dr. Eduard Roux of the hospitalist team and will admit the patient.  Rule out Low suspicion for systemic infection does not meet sepsis or SIRS criteria.  I have low suspicion for ACS she has no chest pain, EKG without signs of ischemia, she does have elevated troponins but this is likely secondary due to demand ischemia.   low suspicion for PE as presentation atypical she has a productive cough rhonchi is heard in bilateral lobes which would explain her new oxygen requirement as well as elevated troponins patient is also anticoagulated making PE less likely at this time.  I have low suspicion for CHF exacerbation no rales heard on exam no peripheral edema present imaging is negative for signs peripheral edema. I have low suspicion for dissection as presentation atypical etiology.    Dispostion and problem list  After consideration of the diagnostic results and the patients response to treatment, I feel that the patent would benefit from admission.  Acute respiratory failure-suspect secondary due to viral bronchitis versus pneumonia, would recommend continue antibiotic treatment, if troponins continue to elevate would consider possible VQ scan for rule out of PE. Elevated troponins-recommend continue to trend them, I suspect this is secondary due to demand ischemia and should resolve on its own but if worsen would consult with cardiology for further recommendations.            Final Clinical Impression(s) / ED Diagnoses Final diagnoses:  Elevated troponin  Acute respiratory failure with hypoxia [facility])    Rx / DC Orders ED Discharge Orders     None         Marcello Fennel, PA-C 09/03/21 1734    Marcello Fennel, PA-C  09/03/21 1735    Noemi Chapel, MD 09/04/21 [phone]

## 2021-09-03 NOTE — ED Notes (Signed)
This nurse called Acute Care to give report, nurse not available, left message with secretary, Inez Catalina, to have the nurse Lovey Newcomer, call ED for report. ?

## 2021-09-03 NOTE — H&P (Signed)
TRH H&P   Patient Demographics:    Debra [name], is a 88 y.o. female  MRN: [id number]   DOB - April 20, 1929  Admit Date - 09/03/2021  Outpatient Primary MD for the patient is Assunta Found, MD  Referring MD/NP/PA: PA Uvaldo Rising  Patient coming from: Home  Chief Complaint  Patient presents with   Cough      HPI:    Debra [name]  is a 88 y.o. female, with past medical history of hypertension, CKD, CAD, CHF with EF of 40%, diabetes, A-fib on Eliquis, patient presents to ED secondary to complaints of cough, patient report cough has been going on for the last week, productive with yellow phlegm, she does report some fever and chills, and congestion, she does report some mild dyspnea as well, denies any chest pain, hemoptysis, no coffee-ground emesis, she denies any sick contacts, no dysuria, no polyuria. -In ED patient was dyspneic, initial x-ray with no evidence of pneumonia, but CT confirmed pneumonia, creatinine at 1.65, troponins 184>> 222, she denies any chest pain, no EKG changes, patient was started on IV Rocephin and azithromycin and [facility] hospitalist consulted to admit.    Review of systems:    In addition to the HPI above,   A full 10 point Review of Systems was done, except as stated above, all other Review of Systems were negative.   With Past History of the following :    Past Medical History:  Diagnosis Date   Arthritis    CKD (chronic kidney disease) stage 4, GFR 15-29 ml/min (HCC)    Colon cancer (HCC)    COPD (chronic obstructive pulmonary disease) (HCC)    Coronary artery disease    a. s/p CABG in 1997 (no detials provided) b. 12/2017: NSTEMI with cath showing patent LIMA-LAD with occluded SVG-OM and presumed occluded SVG-RCA but unable to be visualized, RCA was filled by collaterals. Medical management recommended. c.  07/2019: NSTEMI with rapid atrial fibrillation,  stable coronary and bypass graft anatomy by cardiac catheterization   DDD (degenerative disc disease), lumbar    Diabetic neuropathy (HCC)    Essential hypertension    GERD (gastroesophageal reflux disease)    Gout    Hypothyroidism    Ischemic cardiomyopathy    Restless leg syndrome    Type 2 diabetes mellitus (HCC)       Past Surgical History:  Procedure Laterality Date   ABDOMINAL HYSTERECTOMY     CHOLECYSTECTOMY     COLON RESECTION     CORONARY ARTERY BYPASS GRAFT     ILIAC ARTERY STENT     LEFT HEART CATH AND CORS/GRAFTS ANGIOGRAPHY N/A 12/28/2017   Procedure: LEFT HEART CATH AND CORS/GRAFTS ANGIOGRAPHY;  Surgeon: Swaziland, Peter M, MD;  Location: [facility]ASIVE [facility];  Service: Cardiovascular;  Laterality: N/A;   LEFT HEART CATH AND CORS/GRAFTS ANGIOGRAPHY N/A 08/01/2019   Procedure: LEFT HEART CATH AND  CORS/GRAFTS ANGIOGRAPHY;  Surgeon: Kathleene Hazel, MD;  Location: [facility];  Service: Cardiovascular;  Laterality: N/A;   LUNG SURGERY Left 2012   left lung resection   RENAL ARTERY STENT     THYROID SURGERY     ULTRASOUND GUIDANCE FOR VASCULAR ACCESS  12/28/2017   Procedure: Ultrasound Guidance For Vascular Access;  Surgeon: Swaziland, Peter M, MD;  Location: [facility] INVASIVE CV LAB;  Service: Cardiovascular;;      Social History:     Social History   Tobacco Use   Smoking status: Former    Types: Cigarettes    Quit date: 03/21/1996    Years since quitting: 25.4   Smokeless tobacco: Never  Substance Use Topics   Alcohol use: No       Family History :     Family History  Problem Relation Age of Onset   Colon cancer Mother    Heart disease Father    Heart attack Sister    Breast cancer Daughter      Home Medications:   Prior to Admission medications   Medication Sig Start Date End Date Taking? Authorizing Provider  acetaminophen (TYLENOL) 325 MG tablet Take 650 mg by mouth every 6 (six) hours as needed for mild pain.   Yes [provider]   albuterol (VENTOLIN HFA) 108 (90 Base) MCG/ACT inhaler Inhale 1 puff into the lungs every 6 (six) hours as needed for shortness of breath or wheezing.   Yes [provider]  amiodarone (PACERONE) 200 MG tablet Take 100 mg by mouth daily. 08/30/20  Yes [provider]  apixaban (ELIQUIS) 2.5 MG TABS tablet Take 1 tablet (2.5 mg total) by mouth 2 (two) times daily. 07/21/21  Yes Jonelle Sidle, MD  atorvastatin (LIPITOR) 80 MG tablet Take 1 tablet (80 mg total) by mouth daily at 6 PM. 12/31/17  Yes Strader, [place] M, PA-C  azithromycin (ZITHROMAX) 250 MG tablet Take 250 mg by mouth daily. 08/31/21  Yes [provider]  benzonatate (TESSALON) 100 MG capsule Take 200 mg by mouth 2 (two) times daily. 08/31/21  Yes [provider]  cephALEXin (KEFLEX) 500 MG capsule Take 500 mg by mouth every 12 (twelve) hours. 08/26/21  Yes [provider]  Cholecalciferol (VITAMIN D) 125 MCG (5000 UT) CAPS Take 1 capsule by mouth daily.   Yes [provider]  diazepam (VALIUM) 2 MG tablet Take 2 mg by mouth daily as needed. 06/21/19  Yes [provider]  furosemide (LASIX) 20 MG tablet Take 1 tablet (20 mg total) by mouth daily. 03/19/21  Yes Jacalyn Lefevre, MD  gabapentin (NEURONTIN) 100 MG capsule Take 100 mg by mouth 3 (three) times daily.  03/09/18  Yes [provider]  HYDROcodone-acetaminophen (NORCO) 10-325 MG tablet Take 1 tablet by mouth every 6 (six) hours as needed for moderate pain or severe pain. 1 tab po hs/ and prn.   Yes [provider]  levothyroxine (SYNTHROID) 125 MCG tablet Take 125 mcg by mouth daily. 08/27/21  Yes [provider]  Menthol, Topical Analgesic, (ASPERCREME MAX ROLL-ON) 16 % LIQD Apply 1 application topically as needed.   Yes [provider]  polyethylene glycol (MIRALAX / GLYCOLAX) 17 g packet Take 17 g by mouth daily as needed for mild constipation. 08/02/19  Yes Alwyn Ren, MD   sacubitril-valsartan (ENTRESTO) 49-51 MG Take 1 tablet by mouth 2 (two) times daily. 05/21/21  Yes Jonelle Sidle, MD  levothyroxine (SYNTHROID) 50 MCG  tablet Take 1 tablet (50 mcg total) by mouth daily at 6 (six) AM. Patient not taking: Reported on 09/03/2021 08/03/19   Alwyn Ren, MD     Allergies:     Allergies  Allergen Reactions   Ciprofloxacin Rash and Itching   Iodine    Zolpidem     Other reaction(s): Other (See Comments) hallucination   Iodinated Contrast Media Rash     Physical Exam:   Vitals  Blood pressure (!) 158/66, pulse (!) 48, temperature 98.9 F (37.2 C), temperature source Oral, resp. rate 20, height 5\' 7"  (1.702 m), weight 49.4 kg, SpO2 (!) 78 %.   1. General frail, elderly female, laying in bed in no apparent distress  2. Normal affect and insight, Not Suicidal or Homicidal, Awake Alert, Oriented X 3.  3. No F.N deficits, ALL C.Nerves Intact, Strength 5/5 all 4 extremities, Sensation intact all 4 extremities, Plantars down going.  4. Ears and Eyes appear Normal, Conjunctivae clear, PERRLA. Moist Oral Mucosa.  5. Supple Neck, No JVD, No cervical lymphadenopathy appriciated, No Carotid Bruits.  6. Symmetrical Chest wall movement, Good air movement bilaterally, right lung Rales.  7. RRR, No Gallops, Rubs or Murmurs, No Parasternal Heave.  8. Positive Bowel Sounds, Abdomen Soft, No tenderness, No organomegaly appriciated,No rebound -guarding or rigidity.  9.  No Cyanosis, Normal Skin Turgor, No Skin Rash or Bruise.  10. Good muscle tone,  joints appear normal , no effusions, Normal ROM.  11. No Palpable Lymph Nodes in Neck or Axillae    Data Review:    CBC Recent Labs  Lab 09/01/21 0528 09/03/21 1403  WBC 4.4 12.8*  HGB 11.8* 11.6*  HCT 35.8* 35.9*  PLT 178 177  MCV 100.8* 102.6*  MCH 33.2 33.1  MCHC 33.0 32.3  RDW 14.2 14.3  LYMPHSABS 1.1  --   MONOABS 0.5  --   EOSABS 0.2  --   BASOSABS 0.0  --     ------------------------------------------------------------------------------------------------------------------  Chemistries  Recent Labs  Lab 09/01/21 0528 09/03/21 1403 09/03/21 1407  NA 135 135  --   K 3.9 3.6  --   CL 102 100  --   CO2 23 26  --   GLUCOSE 109* 144*  --   BUN 37* 35*  --   CREATININE 1.62* 1.65*  --   CALCIUM 8.9 8.6*  --   AST  --   --  21  ALT  --   --  14  ALKPHOS  --   --  73  BILITOT  --   --  0.6   ------------------------------------------------------------------------------------------------------------------ estimated creatinine clearance is 17.7 mL/min (A) (by C-G formula based on SCr of 1.65 mg/dL (H)). ------------------------------------------------------------------------------------------------------------------ No results for input(s): TSH, T4TOTAL, T3FREE, THYROIDAB in the last 72 hours.  Invalid input(s): FREET3  Coagulation profile No results for input(s): INR, PROTIME in the last 168 hours. ------------------------------------------------------------------------------------------------------------------- No results for input(s): DDIMER in the last 72 hours. -------------------------------------------------------------------------------------------------------------------  Cardiac Enzymes No results for input(s): CKMB, TROPONINI, MYOGLOBIN in the last 168 hours.  Invalid input(s): CK ------------------------------------------------------------------------------------------------------------------    Component Value Date/Time   BNP 2,527.0 (H) 03/19/2021 0930     ---------------------------------------------------------------------------------------------------------------  Urinalysis    Component Value Date/Time   COLORURINE YELLOW 09/01/2021 0544   APPEARANCEUR CLEAR 09/01/2021 0544   LABSPEC 1.011 09/01/2021 0544   PHURINE 6.0 09/01/2021 0544   GLUCOSEU NEGATIVE 09/01/2021 0544   HGBUR NEGATIVE 09/01/2021 0544    BILIRUBINUR NEGATIVE 09/01/2021 0544   Lavenia Atlas  NEGATIVE 09/01/2021 0544   PROTEINUR NEGATIVE 09/01/2021 0544   NITRITE NEGATIVE 09/01/2021 0544   LEUKOCYTESUR NEGATIVE 09/01/2021 0544    ----------------------------------------------------------------------------------------------------------------   Imaging Results:    CT CHEST WO CONTRAST  Result Date: 09/03/2021 CLINICAL DATA:  Cough, short of breath EXAM: CT CHEST WITHOUT CONTRAST TECHNIQUE: Multidetector CT imaging of the chest was performed following the standard protocol without IV contrast. RADIATION DOSE REDUCTION: This exam was performed according to the departmental dose-optimization program which includes automated exposure control, adjustment of the mA and/or kV according to patient size and/or use of iterative reconstruction technique. COMPARISON:  CT chest 12/26/2017 FINDINGS: Cardiovascular: Coronary artery calcification and aortic atherosclerotic calcification. Mediastinum/Nodes: No axillary or supraclavicular adenopathy. No mediastinal or hilar adenopathy. No pericardial fluid. Esophagus normal. Lungs/Pleura: Nodular airspace densities in the RIGHT lower lobe in a bronchovascular distribution. Resection margin in the RIGHT upper lung without discrete nodularity. LEFT lung clear. Upper Abdomen: Limited view of the liver, kidneys, pancreas are unremarkable. Normal adrenal glands. Atrophic LEFT kidney Musculoskeletal: No aggressive osseous lesion. Degenerative osteophytosis of the spine. IMPRESSION: 1. Findings most consistent with RIGHT lower lobe bronchopneumonia. As there is a significant nodular component, recommend follow-up CT after antibiotic therapy to ensure resolution and exclude malignant nodularity (not favored). 2. Coronary artery calcification and Aortic Atherosclerosis (ICD10-I70.0). Electronically Signed   By: Genevive Bi M.D.   On: 09/03/2021 17:57   DG Chest Portable 1 View  Result Date: 09/03/2021 CLINICAL  DATA:  Cough, shortness of breath. EXAM: PORTABLE CHEST 1 VIEW COMPARISON:  March 19, 2021. FINDINGS: Stable cardiomediastinal silhouette. Sternotomy wires are noted. Postoperative changes are noted in the right upper lobe. No acute pulmonary disease is noted. Bony thorax is unremarkable. IMPRESSION: No active disease. Electronically Signed   By: Lupita Raider M.D.   On: 09/03/2021 14:17    My personal review of EKG: Rhythm NSR, Rate  68 /min, QTc 436 , no Acute ST changes   Assessment & Plan:    Principal Problem:   Dyspnea Active Problems:   CAP (community acquired pneumonia)   Asthma   Hypertension   Chronic renal insufficiency, stage 3 (moderate) (HCC)   Type 2 diabetes mellitus (HCC)   CAD (coronary artery disease)   GERD (gastroesophageal reflux disease)   Community-acquired pneumonia -She presents with significant cough, congestion, CT chest significant for right flank pneumonia -Admitted under pneumonia pathway, continue with IV Rocephin and azithromycin, follow-up with sputum culture, Legionella and strep pneumonia antigen -With significant cough, phlegm and congestion, she was encouraged use incentive spirometer, flutter valve, started on Mucinex -We will need repeat imaging in 4 to 6 weeks to ensure resolution of pneumonia.  History of A-fib -Continue with Eliquis for anticoagulation -Continue with amiodarone  Chronic systolic CHF -No evidence of volume overload, will hold Lasix -continue with Entresto  History of hypothyroidism -Continue with Synthroid  Elevated troponins -This is likely due to demand ischemia and hypoxia, with baseline CKD she denies any chest pain. -EKG nonacute  Hypertension -Continue with home medications  Type 2 diabetes mellitus -Does not appear to be on any home medications, will keep an insulin sliding scale   CKD stage III  -Monitor renal function closely   History of COPD /asthma  -With some wheezing, continue with as  needed albuterol   DVT Prophylaxis Eliquis   AM Labs Ordered, also please review Full Orders  Family Communication: Admission, patients condition and plan of care including tests being ordered have been discussed with the  patient  who indicate understanding and agree with the plan and Code Status.  Code Status Full  Likely DC to  Home  Condition GUARDED    Consults called: none    Admission status: observation    Time spent in minutes : 70 minutes   Huey Bienenstock M.D on 09/03/2021 at 7:05 PM   [facility] - Office  [phone]

## 2021-09-03 NOTE — ED Triage Notes (Signed)
Pt to the ED via [facility] from home with complaints of dark yellow sputum, cough, and rhonchi throughout all lung field. ? ?Pt is being treated for a UTI and has two days of antibiotics remaining. ? ? ?

## 2021-09-03 NOTE — ED Provider Notes (Signed)
Medical screening examination/treatment/procedure(s) were conducted as a shared visit with non-physician practitioner(s) and myself.  I personally evaluated the patient during the encounter. ? ?Clinical Impression:  ? ?Final diagnoses:  ?Elevated troponin  ?Acute respiratory failure with hypoxia ([place])  ? ? ?The patient is a critically ill-appearing 88 year old female presenting with increasing coughing shortness of breath and productive sputum.  Oxygen levels on arrival seem to be normal, not picking up a great waveform on the pulse oximeter, she is on 2 L of oxygen right now with mild tachypnea and a prolonged expiratory phase.  There is some wheezing some rhonchi some rales and frequent coughing with sputum production. ? ?Chest x-ray does not reveal pneumonia, labs do reveal leukocytosis but also reveal that there is an elevated troponin at around 180.  EKG has been ordered.  The patient will likely need to be admitted to the hospital to high level of care, they are critically ill. ? ?Ultimately patient had rising troponin, I suspect this is demand ischemia however the patient is critically ill with acute respiratory failure due to hypoxia and needs admission to the hospital ?  ?Noemi Chapel, MD ?09/04/21 [phone] ? ?

## 2021-09-04 DIAGNOSIS — E860 Dehydration: Secondary | ICD-10-CM | POA: Diagnosis present

## 2021-09-04 DIAGNOSIS — Z515 Encounter for palliative care: Secondary | ICD-10-CM | POA: Diagnosis not present

## 2021-09-04 DIAGNOSIS — J45909 Unspecified asthma, uncomplicated: Secondary | ICD-10-CM | POA: Diagnosis not present

## 2021-09-04 DIAGNOSIS — Z66 Do not resuscitate: Secondary | ICD-10-CM | POA: Diagnosis not present

## 2021-09-04 DIAGNOSIS — E1165 Type 2 diabetes mellitus with hyperglycemia: Secondary | ICD-10-CM | POA: Diagnosis not present

## 2021-09-04 DIAGNOSIS — R778 Other specified abnormalities of plasma proteins: Secondary | ICD-10-CM

## 2021-09-04 DIAGNOSIS — I5022 Chronic systolic (congestive) heart failure: Secondary | ICD-10-CM | POA: Diagnosis present

## 2021-09-04 DIAGNOSIS — N183 Chronic kidney disease, stage 3 unspecified: Secondary | ICD-10-CM | POA: Diagnosis not present

## 2021-09-04 DIAGNOSIS — J9601 Acute respiratory failure with hypoxia: Secondary | ICD-10-CM | POA: Diagnosis not present

## 2021-09-04 DIAGNOSIS — I2510 Atherosclerotic heart disease of native coronary artery without angina pectoris: Secondary | ICD-10-CM | POA: Diagnosis present

## 2021-09-04 DIAGNOSIS — T380X5A Adverse effect of glucocorticoids and synthetic analogues, initial encounter: Secondary | ICD-10-CM | POA: Diagnosis not present

## 2021-09-04 DIAGNOSIS — R0600 Dyspnea, unspecified: Secondary | ICD-10-CM | POA: Diagnosis not present

## 2021-09-04 DIAGNOSIS — I248 Other forms of acute ischemic heart disease: Secondary | ICD-10-CM | POA: Diagnosis present

## 2021-09-04 DIAGNOSIS — N179 Acute kidney failure, unspecified: Secondary | ICD-10-CM | POA: Diagnosis not present

## 2021-09-04 DIAGNOSIS — J440 Chronic obstructive pulmonary disease with acute lower respiratory infection: Secondary | ICD-10-CM | POA: Diagnosis present

## 2021-09-04 DIAGNOSIS — R0602 Shortness of breath: Secondary | ICD-10-CM | POA: Diagnosis not present

## 2021-09-04 DIAGNOSIS — N184 Chronic kidney disease, stage 4 (severe): Secondary | ICD-10-CM | POA: Diagnosis present

## 2021-09-04 DIAGNOSIS — Z20822 Contact with and (suspected) exposure to covid-19: Secondary | ICD-10-CM | POA: Diagnosis present

## 2021-09-04 DIAGNOSIS — R531 Weakness: Secondary | ICD-10-CM | POA: Diagnosis not present

## 2021-09-04 DIAGNOSIS — K219 Gastro-esophageal reflux disease without esophagitis: Secondary | ICD-10-CM | POA: Diagnosis not present

## 2021-09-04 DIAGNOSIS — E039 Hypothyroidism, unspecified: Secondary | ICD-10-CM | POA: Diagnosis present

## 2021-09-04 DIAGNOSIS — E43 Unspecified severe protein-calorie malnutrition: Secondary | ICD-10-CM | POA: Diagnosis present

## 2021-09-04 DIAGNOSIS — I10 Essential (primary) hypertension: Secondary | ICD-10-CM | POA: Diagnosis not present

## 2021-09-04 DIAGNOSIS — I129 Hypertensive chronic kidney disease with stage 1 through stage 4 chronic kidney disease, or unspecified chronic kidney disease: Secondary | ICD-10-CM | POA: Diagnosis not present

## 2021-09-04 DIAGNOSIS — R627 Adult failure to thrive: Secondary | ICD-10-CM | POA: Diagnosis present

## 2021-09-04 DIAGNOSIS — Z681 Body mass index (BMI) 19 or less, adult: Secondary | ICD-10-CM | POA: Diagnosis not present

## 2021-09-04 DIAGNOSIS — I517 Cardiomegaly: Secondary | ICD-10-CM | POA: Diagnosis not present

## 2021-09-04 DIAGNOSIS — E1169 Type 2 diabetes mellitus with other specified complication: Secondary | ICD-10-CM | POA: Diagnosis not present

## 2021-09-04 DIAGNOSIS — J189 Pneumonia, unspecified organism: Secondary | ICD-10-CM | POA: Diagnosis present

## 2021-09-04 DIAGNOSIS — R64 Cachexia: Secondary | ICD-10-CM | POA: Diagnosis present

## 2021-09-04 DIAGNOSIS — E1140 Type 2 diabetes mellitus with diabetic neuropathy, unspecified: Secondary | ICD-10-CM | POA: Diagnosis present

## 2021-09-04 DIAGNOSIS — I471 Supraventricular tachycardia: Secondary | ICD-10-CM | POA: Diagnosis present

## 2021-09-04 DIAGNOSIS — Z951 Presence of aortocoronary bypass graft: Secondary | ICD-10-CM | POA: Diagnosis not present

## 2021-09-04 DIAGNOSIS — E1122 Type 2 diabetes mellitus with diabetic chronic kidney disease: Secondary | ICD-10-CM | POA: Diagnosis present

## 2021-09-04 DIAGNOSIS — I130 Hypertensive heart and chronic kidney disease with heart failure and stage 1 through stage 4 chronic kidney disease, or unspecified chronic kidney disease: Secondary | ICD-10-CM | POA: Diagnosis present

## 2021-09-04 LAB — STREP PNEUMONIAE URINARY ANTIGEN: Strep Pneumo Urinary Antigen: NEGATIVE

## 2021-09-04 LAB — URINALYSIS, ROUTINE W REFLEX MICROSCOPIC
Bacteria, UA: NONE SEEN
Bilirubin Urine: NEGATIVE
Glucose, UA: NEGATIVE mg/dL
Ketones, ur: NEGATIVE mg/dL
Leukocytes,Ua: NEGATIVE
Nitrite: NEGATIVE
Protein, ur: 30 mg/dL — AB
Specific Gravity, Urine: 1.017 (ref 1.005–1.030)
pH: 5 (ref 5.0–8.0)

## 2021-09-04 LAB — BASIC METABOLIC PANEL
Anion gap: 9 (ref 5–15)
BUN: 31 mg/dL — ABNORMAL HIGH (ref 8–23)
CO2: 22 mmol/L (ref 22–32)
Calcium: 8.6 mg/dL — ABNORMAL LOW (ref 8.9–10.3)
Chloride: 106 mmol/L (ref 98–111)
Creatinine, Ser: 1.35 mg/dL — ABNORMAL HIGH (ref 0.44–1.00)
GFR, Estimated: 37 mL/min — ABNORMAL LOW (ref >60)
Glucose, Bld: 109 mg/dL — ABNORMAL HIGH (ref 70–99)
Potassium: 4.3 mmol/L (ref 3.5–5.1)
Sodium: 137 mmol/L (ref 135–145)

## 2021-09-04 LAB — CBC
HCT: 31 % — ABNORMAL LOW (ref 36.0–46.0)
Hemoglobin: 10.1 g/dL — ABNORMAL LOW (ref 12.0–15.0)
MCH: 33.3 pg (ref 26.0–34.0)
MCHC: 32.6 g/dL (ref 30.0–36.0)
MCV: 102.3 fL — ABNORMAL HIGH (ref 80.0–100.0)
Platelets: 152 10*3/uL (ref 150–400)
RBC: 3.03 MIL/uL — ABNORMAL LOW (ref 3.87–5.11)
RDW: 14.1 % (ref 11.5–15.5)
WBC: 11.5 10*3/uL — ABNORMAL HIGH (ref 4.0–10.5)
nRBC: 0 % (ref 0.0–0.2)

## 2021-09-04 MED ORDER — HYDROCOD POLI-CHLORPHE POLI ER 10-8 MG/5ML PO SUER
5.0000 mL | Freq: Two times a day (BID) | ORAL | Status: DC | PRN
  Administered 2021-09-04: 5 mL via ORAL
  Filled 2021-09-04: qty 5

## 2021-09-04 NOTE — Evaluation (Signed)
Physical Therapy Evaluation ?Patient Details ?Name: Debra [name] ?MRN: [id number] ?DOB: 05/06/1929 ?Today's Date: 09/04/2021 ? ?History of Present Illness ? Debra [name]  is a 88 y.o. female, with past medical history of hypertension, CKD, CAD, CHF with EF of 40%, diabetes, A-fib on Eliquis, patient presents to ED secondary to complaints of cough, patient report cough has been going on for the last week, productive with yellow phlegm, she does report some fever and chills, and congestion, she does report some mild dyspnea as well, denies any chest pain, hemoptysis, no coffee-ground emesis, she denies any sick contacts, no dysuria, no polyuria. ?  ?Clinical Impression ? Patient functioning near baseline for mobility and ambulation but does demonstrate overall generalized weakness. Patient seated in chair at beginning of session. She is able to transfer and ambulate with slow, labored mobility but does not require assist. She is limited by fatigue with ambulation and is provided with cueing for proper RW use. Patient will benefit from continued skilled physical therapy in hospital and recommended venue below to increase strength, balance, endurance for safe ADLs and gait. ?   ?   ? ?Recommendations for follow up therapy are one component of a multi-disciplinary discharge planning process, led by the attending physician.  Recommendations may be updated based on patient status, additional functional criteria and insurance authorization. ? ?Follow Up Recommendations No PT follow up ? ?  ?Assistance Recommended at Discharge Intermittent Supervision/Assistance  ?Patient can return home with the following ? Assistance with cooking/housework;A little help with bathing/dressing/bathroom ? ?  ?Equipment Recommendations None recommended by PT  ?Recommendations for Other Services ?    ?  ?Functional Status Assessment Patient has had a recent decline in their functional status and demonstrates the ability to make significant  improvements in function in a reasonable and predictable amount of time.  ? ?  ?Precautions / Restrictions Precautions ?Precautions: Fall ?Restrictions ?Weight Bearing Restrictions: No  ? ?  ? ?Mobility ? Bed Mobility ?  ?  ?  ?  ?  ?  ?  ?  ?  ? ?Transfers ?Overall transfer level: Needs assistance ?Equipment used: Rolling walker (2 wheels) ?Transfers: Sit to/from Stand ?Sit to Stand: Supervision, Min guard ?  ?  ?  ?  ?  ?General transfer comment: slow, labored ?  ? ?Ambulation/Gait ?Ambulation/Gait assistance: Min guard ?Gait Distance (Feet): 30 Feet ?Assistive device: Rolling walker (2 wheels) ?Gait Pattern/deviations: Decreased stride length, Trunk flexed ?Gait velocity: decreased ?  ?  ?General Gait Details: labored cadence, cueing for staying within RW, limited by fatigue ? ?Stairs ?  ?  ?  ?  ?  ? ?Wheelchair Mobility ?  ? ?Modified Rankin (Stroke Patients Only) ?  ? ?  ? ?Balance Overall balance assessment: Needs assistance ?Sitting-balance support: No upper extremity supported, Feet supported ?Sitting balance-Leahy Scale: Good ?Sitting balance - Comments: seated EOB ?  ?Standing balance support: Bilateral upper extremity supported, During functional activity, Reliant on assistive device for balance ?Standing balance-Leahy Scale: Fair ?Standing balance comment: fair to good with RW ?  ?  ?  ?  ?  ?  ?  ?  ?  ?  ?  ?   ? ? ? ?Pertinent Vitals/Pain Pain Assessment ?Pain Assessment: No/denies pain  ? ? ?Home Living Family/patient expects to be discharged to:: Private residence ?Living Arrangements: Children ?Available Help at Discharge: Family;Available 24 hours/day ?Type of Home: House ?Home Access: Ramped entrance ?  ?  ?  ?Home Layout: One level ?  Home Equipment: Rollator (4 wheels);Shower seat;Wheelchair - manual ?Additional Comments: Pt lives with daughter.  ?  ?Prior Function Prior Level of Function : Needs assist ?  ?  ?  ?Physical Assist : Mobility (physical);ADLs (physical) ?Mobility (physical):  Transfers;Gait ?ADLs (physical): IADLs ?Mobility Comments: Pt reports household and community ambulation with use or rollator. ?ADLs Comments: Pt reports independence with ADL's and daughter assisting with IADL's but pt will go to grocery store with daughter. ?  ? ? ?Hand Dominance  ? Dominant Hand: Right ? ?  ?Extremity/Trunk Assessment  ? Upper Extremity Assessment ?Upper Extremity Assessment: Defer to OT evaluation ?  ? ?Lower Extremity Assessment ?Lower Extremity Assessment: Generalized weakness ?  ? ?Cervical / Trunk Assessment ?Cervical / Trunk Assessment: Kyphotic  ?Communication  ? Communication: No difficulties  ?Cognition Arousal/Alertness: Awake/alert ?Behavior During Therapy: [facility] for tasks assessed/performed ?Overall Cognitive Status: Within Functional Limits for tasks assessed ?  ?  ?  ?  ?  ?  ?  ?  ?  ?  ?  ?  ?  ?  ?  ?  ?  ?  ?  ? ?  ?General Comments   ? ?  ?Exercises    ? ?Assessment/Plan  ?  ?PT Assessment Patient does not need any further PT services  ?PT Problem List Decreased strength;Decreased mobility;Decreased activity tolerance;Decreased balance ? ?   ?  ?PT Treatment Interventions DME instruction;Therapeutic exercise;Gait training;Balance training;Stair training;Neuromuscular re-education;Functional mobility training;Therapeutic activities;Patient/family education   ? ?PT Goals (Current goals can be found in the Care Plan section)  ?Acute Rehab PT Goals ?Patient Stated Goal: Return home with family to assist ?PT Goal Formulation: With patient ?Time For Goal Achievement: 09/11/21 ?Potential to Achieve Goals: Good ? ?  ?Frequency Min 3X/week ?  ? ? ?Co-evaluation   ?  ?  ?  ?  ? ? ?  ?AM-PAC PT "6 Clicks" Mobility  ?Outcome Measure Help needed turning from your back to your side while in a flat bed without using bedrails?: None ?Help needed moving from lying on your back to sitting on the side of a flat bed without using bedrails?: None ?Help needed moving to and from a bed to a chair  (including a wheelchair)?: A Little ?Help needed standing up from a chair using your arms (e.g., wheelchair or bedside chair)?: A Little ?Help needed to walk in hospital room?: A Little ?Help needed climbing 3-5 steps with a railing? : A Lot ?6 Click Score: 19 ? ?  ?End of Session   ?Activity Tolerance: Patient tolerated treatment well;Patient limited by fatigue ?Patient left: in chair;with chair alarm set ?Nurse Communication: Mobility status ?PT Visit Diagnosis: Unsteadiness on feet (R26.81);Other abnormalities of gait and mobility (R26.89);Muscle weakness (generalized) (M62.81) ?  ? ?Time: [phone] ?PT Time Calculation (min) (ACUTE ONLY): 7 min ? ? ?Charges:   PT Evaluation ?$PT Eval Low Complexity: 1 Low ?  ?  ?   ? ? ?11:13 AM, 09/04/21 ?Mearl Latin PT, DPT ?Physical Therapist at [facility] ?[facility] ? ? ?

## 2021-09-04 NOTE — Hospital Course (Addendum)
Sahory Nordling  is a 88 y.o. female, with past medical history of hypertension, CKD, CAD, CHF with EF of 40%, diabetes, A-fib on Eliquis, patient presents to ED secondary to complaints of cough, patient report cough has been going on for the last week, productive with yellow phlegm, she does report some fever and chills, and congestion, she does report some mild dyspnea as well, denies any chest pain, hemoptysis, no coffee-ground emesis, she denies any sick contacts, no dysuria, no polyuria. ?-In ED patient was dyspneic, initial x-ray with no evidence of pneumonia, but CT confirmed pneumonia, creatinine at 1.65, troponins 184>> 222, she denies any chest pain, no EKG changes, patient was started on IV Rocephin and azithromycin ? ?09/09/21: Patient is on doxycycline for community-acquired pneumonia and was also started on steroids which are being weaned.  She continues to have high blood glucose levels and poor appetite as well as signs of dehydration with elevating AKI.  She will be started on IV fluid today. ?

## 2021-09-04 NOTE — Plan of Care (Signed)
?  Problem: Acute Rehab PT Goals(only PT should resolve) ?Goal: Pt Will Transfer Bed To Chair/Chair To Bed ?Outcome: Progressing ?Flowsheets (Taken 09/04/2021 1114) ?Pt will Transfer Bed to Chair/Chair to Bed: with modified independence ?Goal: Pt Will Ambulate ?Outcome: Progressing ?Flowsheets (Taken 09/04/2021 1114) ?Pt will Ambulate: ? 50 feet ? with modified independence ? with least restrictive assistive device ?Goal: Pt/caregiver will Perform Home Exercise Program ?Outcome: Progressing ?Flowsheets (Taken 09/04/2021 1114) ?Pt/caregiver will Perform Home Exercise Program: ? For increased strengthening ? For improved balance ? With Supervision, verbal cues required/provided ? Independently ? 11:14 AM, 09/04/21 ?Mearl Latin PT, DPT ?Physical Therapist at [facility] ?[facility] ? ?

## 2021-09-04 NOTE — Consult Note (Addendum)
Cardiology Consultation:   Patient ID: Debra [name] MRN: [id number]; DOB: [birth year]  Admit date: 09/03/2021 Date of Consult: 09/04/2021  PCP:  Sharilyn Sites, MD   [facility] Providers Cardiologist:  Rozann Lesches, MD        Patient Profile:   Debra [name] is a 88 y.o. female with a hx of CABG 1997, med mgt 2019 & 2021 NSTEMI w/ occluded SVG-OM, SVG-RCA, DM, HTN, GERD, hypothyroid, gout, sinus bradycardia, Afib on amio and Eliquis, HFrEF w/ EF 40%, who is being seen 09/04/2021 for the evaluation of elevated trop, at the request of Dr Roger Shelter.  History of Present Illness:   Debra [name] began noticing increasing shortness of breath and cough several days ago.  She possibly had some low-grade fevers.  The cough was productive of thick yellowish sputum.  Over the next couple of days, her symptoms worsened to the point that she came into the hospital.  She has been started on antibiotics, guaifenesin, and Tessalon Perles.  She is coughing less and generally feels a little better today.  During all of this she has not had any chest pain.  She was not aware of the atrial fibrillation when she had it, and does not know if she has been having it recently.  She is compliant with the low-dose amiodarone and decreased dose Eliquis.  She lives with her daughter who helps with things.  She generally feels that she does pretty well, but she is weak and cannot do very much at baseline.  She does not check her weight regularly, but does not wake with lower extremity edema, no orthopnea or PND until her acute illness.  When she first moved to the area 3 years ago, she weighed 180 pounds.  She has lost weight steadily since then, partly because of being sick.  She says her appetite has been extremely poor.  Currently she weighs about 109 pounds.  She says her appetite is starting to improve.  She quit smoking 20 years ago.   Past Medical History:  Diagnosis Date   Arthritis    CKD (chronic  kidney disease) stage 4, GFR 15-29 ml/min (HCC)    Colon cancer (HCC)    COPD (chronic obstructive pulmonary disease) ([place])    Coronary artery disease    a. s/p CABG in 1997 (no detials provided) b. 12/2017: NSTEMI with cath showing patent LIMA-LAD with occluded SVG-OM and presumed occluded SVG-RCA but unable to be visualized, RCA was filled by collaterals. Medical management recommended. c.  07/2019: NSTEMI with rapid atrial fibrillation, stable coronary and bypass graft anatomy by cardiac catheterization   DDD (degenerative disc disease), lumbar    Diabetic neuropathy (HCC)    Essential hypertension    GERD (gastroesophageal reflux disease)    Gout    Hypothyroidism    Ischemic cardiomyopathy    Restless leg syndrome    Type 2 diabetes mellitus ([place])     Past Surgical History:  Procedure Laterality Date   ABDOMINAL HYSTERECTOMY     CHOLECYSTECTOMY     COLON RESECTION     CORONARY ARTERY BYPASS GRAFT     ILIAC ARTERY STENT     LEFT HEART CATH AND CORS/GRAFTS ANGIOGRAPHY N/A 12/28/2017   Procedure: LEFT HEART CATH AND CORS/GRAFTS ANGIOGRAPHY;  Surgeon: Martinique, Peter M, MD;  Location: [place] [facility];  Service: Cardiovascular;  Laterality: N/A;   LEFT HEART CATH AND CORS/GRAFTS ANGIOGRAPHY N/A 08/01/2019   Procedure: LEFT HEART CATH AND CORS/GRAFTS ANGIOGRAPHY;  Surgeon:  Burnell Blanks, MD;  Location: [place] [facility];  Service: Cardiovascular;  Laterality: N/A;   LUNG SURGERY Left 2012   left lung resection   RENAL ARTERY STENT     THYROID SURGERY     ULTRASOUND GUIDANCE FOR VASCULAR ACCESS  12/28/2017   Procedure: Ultrasound Guidance For Vascular Access;  Surgeon: Martinique, Peter M, MD;  Location: [place] [facility];  Service: Cardiovascular;;     Home Medications:  Prior to Admission medications   Medication Sig Start Date End Date Taking? Authorizing Provider  acetaminophen (TYLENOL) 325 MG tablet Take 650 mg by mouth every 6 (six) hours as needed for mild pain.   Yes  [provider]  albuterol (VENTOLIN HFA) 108 (90 Base) MCG/ACT inhaler Inhale 1 puff into the lungs every 6 (six) hours as needed for shortness of breath or wheezing.   Yes [provider]  amiodarone (PACERONE) 200 MG tablet Take 100 mg by mouth daily. 08/30/20  Yes [provider]  apixaban (ELIQUIS) 2.5 MG TABS tablet Take 1 tablet (2.5 mg total) by mouth 2 (two) times daily. 07/21/21  Yes [name], MD  atorvastatin (LIPITOR) 80 MG tablet Take 1 tablet (80 mg total) by mouth daily at 6 PM. 12/31/17  Yes Strader, [place] M, PA-C  azithromycin (ZITHROMAX) 250 MG tablet Take 250 mg by mouth daily. 08/31/21  Yes [provider]  benzonatate (TESSALON) 100 MG capsule Take 200 mg by mouth 2 (two) times daily. 08/31/21  Yes [provider]  cephALEXin (KEFLEX) 500 MG capsule Take 500 mg by mouth every 12 (twelve) hours. 08/26/21  Yes [provider]  Cholecalciferol (VITAMIN D) 125 MCG (5000 UT) CAPS Take 1 capsule by mouth daily.   Yes [provider]  diazepam (VALIUM) 2 MG tablet Take 2 mg by mouth daily as needed. 06/21/19  Yes [provider]  furosemide (LASIX) 20 MG tablet Take 1 tablet (20 mg total) by mouth daily. 03/19/21  Yes Isla Pence, MD  gabapentin (NEURONTIN) 100 MG capsule Take 100 mg by mouth 3 (three) times daily.  03/09/18  Yes [provider]  HYDROcodone-acetaminophen (NORCO) 10-325 MG tablet Take 1 tablet by mouth every 6 (six) hours as needed for moderate pain or severe pain. 1 tab po hs/ and prn.   Yes [provider]  levothyroxine (SYNTHROID) 125 MCG tablet Take 125 mcg by mouth daily. 08/27/21  Yes [provider]  Menthol, Topical Analgesic, (ASPERCREME MAX ROLL-ON) 16 % LIQD Apply 1 application topically as needed.   Yes [provider]  polyethylene glycol (MIRALAX / GLYCOLAX) 17 g packet Take 17 g by mouth daily as needed for mild constipation. 08/02/19  Yes  Georgette Shell, MD  sacubitril-valsartan (ENTRESTO) 49-51 MG Take 1 tablet by mouth 2 (two) times daily. 05/21/21  Yes [name], MD  levothyroxine (SYNTHROID) 50 MCG tablet Take 1 tablet (50 mcg total) by mouth daily at 6 (six) AM. Patient not taking: Reported on 09/03/2021 08/03/19   Georgette Shell, MD    Inpatient Medications: Scheduled Meds:  amiodarone  100 mg Oral Daily   apixaban  2.5 mg Oral BID   atorvastatin  80 mg Oral q1800   benzonatate  200 mg Oral BID   cholecalciferol  5,000 Units Oral Daily   gabapentin  100 mg Oral TID   guaiFENesin  600 mg Oral BID   levothyroxine  125 mcg Oral Daily   sacubitril-valsartan  1 tablet Oral  BID   Continuous Infusions:  azithromycin Stopped (09/03/21 2325)   cefTRIAXone (ROCEPHIN)  IV     methocarbamol (ROBAXIN) IV     PRN Meds: acetaminophen **OR** acetaminophen, albuterol, diazepam, methocarbamol (ROBAXIN) IV, ondansetron **OR** ondansetron (ZOFRAN) IV  Allergies:    Allergies  Allergen Reactions   Ciprofloxacin Rash and Itching   Iodine    Zolpidem     Other reaction(s): Other (See Comments) hallucination   Iodinated Contrast Media Rash    Social History:   Social History   Socioeconomic History   Marital status: Widowed    Spouse name: Not on file   Number of children: Not on file   Years of education: Not on file   Highest education level: Not on file  Occupational History   Not on file  Tobacco Use   Smoking status: Former    Types: Cigarettes    Quit date: 03/21/1996    Years since quitting: 25.4   Smokeless tobacco: Never  Vaping Use   Vaping Use: Never used  Substance and Sexual Activity   Alcohol use: No   Drug use: No   Sexual activity: Not on file  Other Topics Concern   Not on file  Social History Narrative   Not on file   Social Determinants of Health   Financial Resource Strain: Not on file  Food Insecurity: Not on file  Transportation Needs: Not on file  Physical  Activity: Not on file  Stress: Not on file  Social Connections: Not on file  Intimate Partner Violence: Not on file    Family History:   Family History  Problem Relation Age of Onset   Colon cancer Mother    Heart disease Father    Heart attack Sister    Breast cancer Daughter      ROS:  Please see the history of present illness.  All other ROS reviewed and negative.     Physical Exam/Data:   Vitals:   09/03/21 2027 09/04/21 0000 09/04/21 0347 09/04/21 0751  BP: (!) 168/72 (!) 168/69 (!) 160/74 (!) 167/72  Pulse: 66 68 69 70  Resp:  18 18 16   Temp: 98.2 F (36.8 C)   98.6 F (37 C)  TempSrc: Oral   Oral  SpO2: 100% 100% 100% 99%  Weight:      Height:        Intake/Output Summary (Last 24 hours) at 09/04/2021 1033 Last data filed at 09/04/2021 0700 Gross per 24 hour  Intake 491.26 ml  Output 300 ml  Net 191.26 ml   Last 3 Weights 09/03/2021 09/01/2021 06/09/2021  Weight (lbs) 108 lb 14.5 oz 109 lb 115 lb  Weight (kg) 49.4 kg 49.442 kg 52.164 kg     Body mass index is 17.06 kg/m.  General: Frail, elderly female, in no acute distress HEENT: normal for age Neck: JVD elevated, 10-12 cm Vascular: No carotid bruits; Distal pulses 2+ bilaterally Cardiac:  normal S1, S2; slightly irregular rate and rhythm, likely secondary to PVCs; soft murmur  Lungs:   decreased breath sounds bases with a few rales bilaterally, no wheezing, rhonchi   Abd: soft, nontender, no hepatomegaly  Ext: no edema Musculoskeletal:  No deformities, BUE and BLE strength weak but equal Skin: warm and dry  Neuro:  CNs 2-12 intact, no focal abnormalities noted Psych:  Normal affect   EKG:  The EKG was personally reviewed and demonstrates: Sinus rhythm, heart rate 68, no acute ischemic changes Telemetry:  Telemetry was personally reviewed  and demonstrates: Sinus rhythm, occasional PVCs  Relevant CV Studies:  CARDIAC CATH: 08/01/2019 Ost LAD to Prox LAD lesion is 100% stenosed. Ost 1st Mrg to 1st Mrg  lesion is 40% stenosed. Ost 2nd Mrg lesion is 30% stenosed. Ost 3rd Mrg lesion is 90% stenosed. Ost RCA to Dist RCA lesion is 100% stenosed. LIMA and is normal in caliber. The graft exhibits no disease. SVG. Origin to Prox Graft lesion is 100% stenosed. SVG due to known occlusion. Origin to Prox Graft lesion is 100% stenosed. Prox Cx to Mid Cx lesion is 10% stenosed.   1. Triple vessel CAD s/p CABG with 1 patent bypass graft 2. Occluded proximal LAD. Patent LIMA to mid LAD 3. Known occlusion of the SVG to OM. The mid Circumflex stent is patent. Severe disease in a small caliber OM Spiros Greenfeld that fills from left to left collaterals. Unchanged from last cath.  4. Chronic occlusion of the proximal RCA. Known occlusion of the vein graft to the distal RCA. The distal RCA fills from left to right collaterals.    Recommendations: Coronary disease unchanged from last cath in 2019. Continue medical management of CAD.  Diagnostic Dominance: Right   ECHO: 07/31/2019  1. Left ventricular ejection fraction, by visual estimation, is 35 to  40%. The left ventricle has moderately decreased function. There is no  left ventricular hypertrophy.   2. Mild dyskinesis of the left ventricular, basal-mid inferoseptal wall  and inferior wall (consistent with previous infarction in the right  coronary artery distribution).   3. Severe hypokinesis of the left ventricular, basal anteroseptal wall.  The apical segments contract normally (consistent with occlusion of the  proximal LAD artery with patent graft to the distal vessel).   4. Abnormal septal motion consistent with post-operative status.   5. Elevated left atrial pressure.   6. Left ventricular diastolic parameters are consistent with Grade II  diastolic dysfunction (pseudonormalization).   7. The left ventricle demonstrates regional wall motion abnormalities.   8. Global right ventricle has normal systolic function.The right  ventricular size is  normal. No increase in right ventricular wall  thickness.   9. Left atrial size was mildly dilated.  10. Right atrial size was normal.  11. Mild mitral annular calcification.  12. The mitral valve is normal in structure. Mild to moderate mitral valve  regurgitation.  13. The tricuspid valve is normal in structure.  14. The tricuspid valve is normal in structure. Tricuspid valve  regurgitation is not demonstrated.  15. The aortic valve is normal in structure. Aortic valve regurgitation is  not visualized. Mild aortic valve sclerosis without stenosis.  16. The pulmonic valve was grossly normal. Pulmonic valve regurgitation is  trivial.  17. TR signal is inadequate for assessing pulmonary artery systolic  pressure.  18. The inferior vena cava is dilated in size with <50% respiratory  variability, suggesting right atrial pressure of 15 mmHg.  19. No significant change from prior study.  20. Prior images reviewed side by side.    Laboratory Data:  High Sensitivity Troponin:   Recent Labs  Lab 09/03/21 1403 09/03/21 1531  TROPONINIHS 184* 222*     Chemistry Recent Labs  Lab 09/01/21 0528 09/03/21 1403 09/04/21 0611  NA 135 135 137  K 3.9 3.6 4.3  CL 102 100 106  CO2 23 26 22   GLUCOSE 109* 144* 109*  BUN 37* 35* 31*  CREATININE 1.62* 1.65* 1.35*  CALCIUM 8.9 8.6* 8.6*  GFRNONAA 30* 29* 37*  ANIONGAP 10  9 9    Recent Labs  Lab 09/03/21 1407  PROT 6.8  ALBUMIN 3.4*  AST 21  ALT 14  ALKPHOS 73  BILITOT 0.6   Lipids No results for input(s): CHOL, TRIG, HDL, LABVLDL, LDLCALC, CHOLHDL in the last 168 hours.  Hematology Recent Labs  Lab 09/01/21 0528 09/03/21 1403 09/04/21 0611  WBC 4.4 12.8* 11.5*  RBC 3.55* 3.50* 3.03*  HGB 11.8* 11.6* 10.1*  HCT 35.8* 35.9* 31.0*  MCV 100.8* 102.6* 102.3*  MCH 33.2 33.1 33.3  MCHC 33.0 32.3 32.6  RDW 14.2 14.3 14.1  PLT 178 177 152   Thyroid No results for input(s): TSH, FREET4 in the last 168 hours.  BNPNo results for  input(s): BNP, PROBNP in the last 168 hours.  DDimer No results for input(s): DDIMER in the last 168 hours.   Radiology/Studies:  CT CHEST WO CONTRAST  Result Date: 09/03/2021 CLINICAL DATA:  Cough, short of breath EXAM: CT CHEST WITHOUT CONTRAST TECHNIQUE: Multidetector CT imaging of the chest was performed following the standard protocol without IV contrast. RADIATION DOSE REDUCTION: This exam was performed according to the departmental dose-optimization program which includes automated exposure control, adjustment of the mA and/or kV according to patient size and/or use of iterative reconstruction technique. COMPARISON:  CT chest 12/26/2017 FINDINGS: Cardiovascular: Coronary artery calcification and aortic atherosclerotic calcification. Mediastinum/Nodes: No axillary or supraclavicular adenopathy. No mediastinal or hilar adenopathy. No pericardial fluid. Esophagus normal. Lungs/Pleura: Nodular airspace densities in the RIGHT lower lobe in a bronchovascular distribution. Resection margin in the RIGHT upper lung without discrete nodularity. LEFT lung clear. Upper Abdomen: Limited view of the liver, kidneys, pancreas are unremarkable. Normal adrenal glands. Atrophic LEFT kidney Musculoskeletal: No aggressive osseous lesion. Degenerative osteophytosis of the spine. IMPRESSION: 1. Findings most consistent with RIGHT lower lobe bronchopneumonia. As there is a significant nodular component, recommend follow-up CT after antibiotic therapy to ensure resolution and exclude malignant nodularity (not favored). 2. Coronary artery calcification and Aortic Atherosclerosis (ICD10-I70.0). Electronically Signed   By: Suzy Bouchard M.D.   On: 09/03/2021 17:57   CT Lumbar Spine Wo Contrast  Result Date: 09/01/2021 CLINICAL DATA:  88 year old female with acute low back pain. EXAM: CT LUMBAR SPINE WITHOUT CONTRAST TECHNIQUE: Multidetector CT imaging of the lumbar spine was performed without intravenous contrast  administration. Multiplanar CT image reconstructions were also generated. RADIATION DOSE REDUCTION: This exam was performed according to the departmental dose-optimization program which includes automated exposure control, adjustment of the mA and/or kV according to patient size and/or use of iterative reconstruction technique. COMPARISON:  Lumbar MRI 03/31/2019. FINDINGS: Segmentation: Normal, the same numbering system used on the 2020 MRI. Alignment: Straightening of lumbar lordosis appears stable since 2020, with mild chronic levoconvex lumbar scoliosis. However, subtle grade 1 anterolisthesis of L3 on L4 appears increased. Vertebrae: No acute osseous abnormality identified. Visible sacrum and SI joints appear intact. Paraspinal and other soft tissues: Extensive Aortoiliac calcified atherosclerosis. Bilateral renal artery and Common iliac artery vascular stents identified. Vascular patency is not evaluated in the absence of IV contrast. Evidence of prior cholecystectomy. Chronic left renal atrophy and renal cysts. Lumbar paraspinal soft tissues appear within normal limits. Disc levels: Chronic lumbar spine degeneration, most notable for: L2-L3: Chronic disc space loss with vacuum disc and circumferential but mostly right far lateral disc osteophyte complex. Mild chronic spinal and mild to moderate right lateral recess and right foraminal stenosis. This level appears stable since 2020. L3-L4: Subtle anterolisthesis with circumferential disc osteophyte complex and moderate to  severe ligament flavum hypertrophy. Mild to moderate spinal stenosis here appears progressed. Chronic left lateral recess and foraminal stenosis not definitely changed. L4-L5: Chronic severe disc space loss with circumferential disc osteophyte complex and moderate posterior element hypertrophy greater on the left. Chronic moderate left lateral recess and severe left L4 foraminal stenosis. This level appears stable since 2020. L5-S1: Chronic  disc space loss with vacuum disc. Circumferential disc osteophyte complex and moderate facet hypertrophy. Chronic mild spinal stenosis and moderate to severe L5 foraminal stenosis greater on the left. This level appears stable. IMPRESSION: 1. No acute osseous abnormality in the lumbar spine. 2. Chronic lumbar spine degeneration appears stable from a 2020 MRI aside from mild new anterolisthesis of L3 on L4, with evidence of up to moderate increased multifactorial spinal stenosis at that level. Chronic severe stenosis at the left L4 and L5 nerve levels. 3. Aortic Atherosclerosis (ICD10-I70.0). Renal and iliac artery vascular stents. Electronically Signed   By: Genevie Ann M.D.   On: 09/01/2021 06:34   DG Chest Portable 1 View  Result Date: 09/03/2021 CLINICAL DATA:  Cough, shortness of breath. EXAM: PORTABLE CHEST 1 VIEW COMPARISON:  March 19, 2021. FINDINGS: Stable cardiomediastinal silhouette. Sternotomy wires are noted. Postoperative changes are noted in the right upper lobe. No acute pulmonary disease is noted. Bony thorax is unremarkable. IMPRESSION: No active disease. Electronically Signed   By: Marijo Conception M.D.   On: 09/03/2021 14:17     Assessment and Plan:   Elevated troponin -She has had no chest pain - At the time of her MI in 2021, peak troponin was 10,828 -Peak troponin this admission is 222 - She has no symptoms of volume overload, and is in sinus rhythm. - Suspect this is demand ischemia in the setting of CAP. - Unless she has ischemic symptoms, would defer further evaluation for now - She has an office visit scheduled with Dr. Domenic Polite on 3/15, keep this  2.  Chronic systolic CHF: - Her weight on admission was 49.4 kg -  This is below her previous weights -She was on Lasix 20 mg daily prior to admission, this was on hold - Discuss with MD if we can change this to as needed for weight gain -She is on her home medication of Entresto 49-51 mg twice daily  3.  History of atrial  fibs: - She recently wore a Zio patch which showed average heart rate 53 with a low of 36 and a high of 94.  No atrial fibrillation seen, but she did have multiple episodes of SVT, the longest was 2 minutes no pauses - She is compliant with low-dose amiodarone and low-dose Eliquis - No beta-blocker because of a history of resting bradycardia - Keep on telemetry while admitted  4.  Community-acquired pneumonia and other issues, per IM   Risk Assessment/Risk Scores:       New York Heart Association (NYHA) Functional Class NYHA Class III  CHA2DS2-VASc Score = 7   This indicates a 11.2% annual risk of stroke. The patient's score is based upon: CHF History: 1 HTN History: 1 Diabetes History: 1 Stroke History: 0 Vascular Disease History: 1 Age Score: 2 Gender Score: 1    For questions or updates, please contact [place] Please consult [email or web address] for contact info under    Signed, Rosaria Ferries, PA-C  09/04/2021 10:33 AM  Attending note Patient seen and disucssed with PA Barrett, I agree with her documentation. 88 yo female history of CADB in 1997, most  recently NSTEMI 2021 with cath showing occluded LAD, LCX 10%, OM 40%, OM2 30%, OM 3 90%, RCA occluded and RPAV filling from LCX collaterals. LIMA-LAD patent, SVG-OM2 occluded, SVG-RPDA occluded. Stable from 2019 cath, recs for medical management. History of PAF, chronic systolic HF LVEF 57-50%.   Admitted with productive cough, fevers, chills, DOE.    Lactic acid 1.2 K 3.6 BUN 35 WBC 12.8 Plt 177 Hgb 11.6  COVID neg Flu neg Trop 184-->222--> ABG 7.49/38/114/29 CXR no acute process CT chest: RLL PNA EKG NSR  Relatively mild troponin elevation in setting of pneumonia in patient with know chronic obstructive coronary disease consistent with demand ischemia. Has not had any chest pain. EKG without acute ischemic changes. From 2021 cath stable disease without interventional targets. No repeat ischemic testing is planned  at this time. Continue medical therapy with atorva 80, entresto. No ASA since on eliquis. No indication for hep gtt. No beta blockers due to prior bradycardia   No further inpatient testing is indicated, we will sign off inpatient care. Keep 09/16/21 appt with Dr Domenic Polite  Carlyle Dolly MD

## 2021-09-04 NOTE — Progress Notes (Signed)
PROGRESS NOTE    Patient: Debra [name]                            PCP: Sharilyn Sites, MD                    DOB: [birth year]            DOA: 09/03/2021 EXH:[id number]             DOS: 09/04/2021, 11:41 AM   LOS: 0 days   Date of Service: The patient was seen and examined on 09/04/2021  Subjective:   The patient was seen and examined this morning. Hemodynamically stable, currently on room air Complain of generalized emesis..  Currently afebrile normotensive Otherwise no issues overnight .  Brief Narrative:    Debra [name]  is a 88 y.o. female, with past medical history of hypertension, CKD, CAD, CHF with EF of 40%, diabetes, A-fib on Eliquis, patient presents to ED secondary to complaints of cough, patient report cough has been going on for the last week, productive with yellow phlegm, she does report some fever and chills, and congestion, she does report some mild dyspnea as well, denies any chest pain, hemoptysis, no coffee-ground emesis, she denies any sick contacts, no dysuria, no polyuria. -In ED patient was dyspneic, initial x-ray with no evidence of pneumonia, but CT confirmed pneumonia, creatinine at 1.65, troponins 184>> 222, she denies any chest pain, no EKG changes, patient was started on IV Rocephin and azithromycin    Assessment & Plan:   Principal Problem:   Dyspnea Active Problems:   CAP (community acquired pneumonia)   Asthma   Hypertension   Chronic renal insufficiency, stage 3 (moderate) (HCC)   Type 2 diabetes mellitus (HCC)   CAD (coronary artery disease)   GERD (gastroesophageal reflux disease)     Assessment and Plan:   Community-acquired pneumonia -Hemodynamically stable this morning, afebrile normotensive -Has been weaned off supplemental oxygen, currently satting greater 90% on room air  -She presents with significant cough, congestion, CT chest significant for right flank pneumonia -Under treatment with pneumonia pathway -Continue current  antibiotics of azithromycin and Rocephin follow-up with sputum culture, Legionella and strep pneumonia antigen -With significant cough, phlegm and congestion, she was encouraged use incentive spirometer, flutter valve, started on Mucinex -We will need repeat imaging in 4 to 6 weeks to ensure resolution of pneumonia.   History of A-fib -Currently stable -Continue with Eliquis for anticoagulation -Continue with amiodarone   Chronic systolic CHF -No evidence of volume overload, will hold Lasix -continue with Entresto -Stable   History of hypothyroidism -Continue with Synthroid   Elevated troponins -This is likely due to demand ischemia and hypoxia, with baseline CKD  -Troponin 184, 222 -EKG nonacute -Denies any chest pain   Hypertension -Continue with home medications -Stable   Type 2 diabetes mellitus -Does not appear to be on any home medications, -We will check her CBG QA CHS with SSI coverage  CKD stage III  -Monitor renal function closely  -Creatinine 1.65 > 1.35  History of COPD /asthma  -With some wheezing, continue with as needed albuterol   --------------------------------------------------------------------------------------------------------------------------------------- Nutritional status:  The patient's BMI is: Body mass index is 17.06 kg/m. I agree with the assessment and plan as outlined   ----------------------------------------------------------------------------------------------------- Cultures; Blood Cultures x 2 >>  Sputum Culture >>   ---------------------------------------------------------------------------------------------------------------------------------  DVT prophylaxis:  apixaban (ELIQUIS) tablet 2.5 mg Start: 09/03/21 2300  apixaban (ELIQUIS) tablet 2.5 mg   Code Status:   Code Status: Full Code  Family Communication: -Discussed with patient No family member present at bedside- attempt will be made to update daily The above  findings and plan of care has been discussed with patient (and family)  in detail,  they expressed understanding and agreement of above. -Advance care planning has been discussed.   Admission status:   Status is: Observation The patient remains OBS appropriate and will d/c before 2 midnights.           Procedures:   No admission procedures for hospital encounter.   Antimicrobials:  Anti-infectives (From admission, onward)    Start     Dose/Rate Route Frequency Ordered Stop   09/04/21 1600  cefTRIAXone (ROCEPHIN) 2 g in sodium chloride 0.9 % 100 mL IVPB        2 g 200 mL/hr over 30 Minutes Intravenous Every 24 hours 09/03/21 1759     09/03/21 1800  azithromycin (ZITHROMAX) 500 mg in sodium chloride 0.9 % 250 mL IVPB        500 mg 250 mL/hr over 60 Minutes Intravenous Every 24 hours 09/03/21 1759     09/03/21 1515  cefTRIAXone (ROCEPHIN) 2 g in sodium chloride 0.9 % 100 mL IVPB        2 g 200 mL/hr over 30 Minutes Intravenous  Once 09/03/21 1513 09/03/21 1644        Medication:   amiodarone  100 mg Oral Daily   apixaban  2.5 mg Oral BID   atorvastatin  80 mg Oral q1800   benzonatate  200 mg Oral BID   cholecalciferol  5,000 Units Oral Daily   gabapentin  100 mg Oral TID   guaiFENesin  600 mg Oral BID   levothyroxine  125 mcg Oral Daily   sacubitril-valsartan  1 tablet Oral BID    acetaminophen **OR** acetaminophen, albuterol, diazepam, methocarbamol (ROBAXIN) IV, ondansetron **OR** ondansetron (ZOFRAN) IV   Objective:   Vitals:   09/03/21 2027 09/04/21 0000 09/04/21 0347 09/04/21 0751  BP: (!) 168/72 (!) 168/69 (!) 160/74 (!) 167/72  Pulse: 66 68 69 70  Resp:  18 18 16   Temp: 98.2 F (36.8 C)   98.6 F (37 C)  TempSrc: Oral   Oral  SpO2: 100% 100% 100% 99%  Weight:      Height:        Intake/Output Summary (Last 24 hours) at 09/04/2021 1141 Last data filed at 09/04/2021 0700 Gross per 24 hour  Intake 491.26 ml  Output 300 ml  Net 191.26 ml    Filed Weights   09/03/21 1347  Weight: 49.4 kg     Examination:   Physical Exam  Constitution:  Alert, cooperative, no distress,  Appears calm and comfortable  Psychiatric:   Normal and stable mood and affect, cognition intact,   HEENT:        Normocephalic, PERRL, otherwise with in Normal limits  Chest:         Chest symmetric Cardio vascular:  S1/S2, RRR, No murmure, No Rubs or Gallops  pulmonary: Clear to auscultation bilaterally, respirations unlabored, negative wheezes / crackles Abdomen: Soft, non-tender, non-distended, bowel sounds,no masses, no organomegaly Muscular skeletal: Global generalized (Limited exam - in bed, able to move all 4 extremities,   Neuro: CNII-XII intact. , normal motor and sensation, reflexes intact  Extremities: No pitting edema lower extremities, +2 pulses  Skin: Dry, warm to touch, negative for any Rashes, No open  wounds Wounds: per nursing documentation   ------------------------------------------------------------------------------------------------------------------------------------------    LABs:  CBC Latest Ref Rng & Units 09/04/2021 09/03/2021 09/01/2021  WBC 4.0 - 10.5 K/uL 11.5(H) 12.8(H) 4.4  Hemoglobin 12.0 - 15.0 g/dL 10.1(L) 11.6(L) 11.8(L)  Hematocrit 36.0 - 46.0 % 31.0(L) 35.9(L) 35.8(L)  Platelets 150 - 400 K/uL 152 177 178   CMP Latest Ref Rng & Units 09/04/2021 09/03/2021 09/01/2021  Glucose 70 - 99 mg/dL 109(H) 144(H) 109(H)  BUN 8 - 23 mg/dL 31(H) 35(H) 37(H)  Creatinine 0.44 - 1.00 mg/dL 1.35(H) 1.65(H) 1.62(H)  Sodium 135 - 145 mmol/L 137 135 135  Potassium 3.5 - 5.1 mmol/L 4.3 3.6 3.9  Chloride 98 - 111 mmol/L 106 100 102  CO2 22 - 32 mmol/L 22 26 23   Calcium 8.9 - 10.3 mg/dL 8.6(L) 8.6(L) 8.9  Total Protein 6.5 - 8.1 g/dL - 6.8 -  Total Bilirubin 0.3 - 1.2 mg/dL - 0.6 -  Alkaline Phos 38 - 126 U/L - 73 -  AST 15 - 41 U/L - 21 -  ALT 0 - 44 U/L - 14 -       Micro Results Recent Results (from the past 240 hour(s))   Blood culture (routine x 2)     Status: None (Preliminary result)   Collection Time: 09/03/21  1:55 PM   Specimen: Right Antecubital; Blood  Result Value Ref Range Status   Specimen Description RIGHT ANTECUBITAL  Final   Special Requests   Final    BOTTLES DRAWN AEROBIC AND ANAEROBIC Blood Culture adequate volume   Culture   Final    NO GROWTH < 24 HOURS Performed at [facility], [address] [place],  [address]    Report Status PENDING  Incomplete  Resp Panel by RT-PCR (Flu A&B, Covid) Nasopharyngeal Swab     Status: None   Collection Time: 09/03/21  1:55 PM   Specimen: Nasopharyngeal Swab; Nasopharyngeal(NP) swabs in vial transport medium  Result Value Ref Range Status   SARS Coronavirus 2 by RT PCR NEGATIVE NEGATIVE Final    Comment: (NOTE) SARS-CoV-2 target nucleic acids are NOT DETECTED.  The SARS-CoV-2 RNA is generally detectable in upper respiratory specimens during the acute phase of infection. The lowest concentration of SARS-CoV-2 viral copies this assay can detect is 138 copies/mL. A negative result does not preclude SARS-Cov-2 infection and should not be used as the sole basis for treatment or other patient management decisions. A negative result may occur with  improper specimen collection/handling, submission of specimen other than nasopharyngeal swab, presence of viral mutation(s) within the areas targeted by this assay, and inadequate number of viral copies(<138 copies/mL). A negative result must be combined with clinical observations, patient history, and epidemiological information. The expected result is Negative.  Fact Sheet for Patients:  EntrepreneurPulse.com.au  Fact Sheet for Healthcare Providers:  IncredibleEmployment.be  This test is no t yet approved or cleared by the [place] FDA and  has been authorized for detection and/or diagnosis of SARS-CoV-2 by FDA under an Emergency Use Authorization  (EUA). This EUA will remain  in effect (meaning this test can be used) for the duration of the COVID-19 declaration under Section 564(b)(1) of the Act, 21 U.S.C.section 360bbb-3(b)(1), unless the authorization is terminated  or revoked sooner.       Influenza A by PCR NEGATIVE NEGATIVE Final   Influenza B by PCR NEGATIVE NEGATIVE Final    Comment: (NOTE) The Xpert Xpress SARS-CoV-2/FLU/RSV plus assay is intended as an aid in the  diagnosis of influenza from Nasopharyngeal swab specimens and should not be used as a sole basis for treatment. Nasal washings and aspirates are unacceptable for Xpert Xpress SARS-CoV-2/FLU/RSV testing.  Fact Sheet for Patients: EntrepreneurPulse.com.au  Fact Sheet for Healthcare Providers: IncredibleEmployment.be  This test is not yet approved or cleared by the [place] FDA and has been authorized for detection and/or diagnosis of SARS-CoV-2 by FDA under an Emergency Use Authorization (EUA). This EUA will remain in effect (meaning this test can be used) for the duration of the COVID-19 declaration under Section 564(b)(1) of the Act, 21 U.S.C. section 360bbb-3(b)(1), unless the authorization is terminated or revoked.  Performed at [facility], [address] [place], [place] [address]   Blood culture (routine x 2)     Status: None (Preliminary result)   Collection Time: 09/03/21  2:00 PM   Specimen: Left Antecubital; Blood  Result Value Ref Range Status   Specimen Description LEFT ANTECUBITAL  Final   Special Requests   Final    BOTTLES DRAWN AEROBIC AND ANAEROBIC Blood Culture adequate volume   Culture   Final    NO GROWTH < 24 HOURS Performed at [facility], [address], [place], [place] [address]    Report Status PENDING  Incomplete    Radiology Reports CT CHEST WO CONTRAST  Result Date: 09/03/2021 CLINICAL DATA:  Cough, short of breath EXAM: CT CHEST WITHOUT CONTRAST TECHNIQUE: Multidetector CT  imaging of the chest was performed following the standard protocol without IV contrast. RADIATION DOSE REDUCTION: This exam was performed according to the departmental dose-optimization program which includes automated exposure control, adjustment of the mA and/or kV according to patient size and/or use of iterative reconstruction technique. COMPARISON:  CT chest 12/26/2017 FINDINGS: Cardiovascular: Coronary artery calcification and aortic atherosclerotic calcification. Mediastinum/Nodes: No axillary or supraclavicular adenopathy. No mediastinal or hilar adenopathy. No pericardial fluid. Esophagus normal. Lungs/Pleura: Nodular airspace densities in the RIGHT lower lobe in a bronchovascular distribution. Resection margin in the RIGHT upper lung without discrete nodularity. LEFT lung clear. Upper Abdomen: Limited view of the liver, kidneys, pancreas are unremarkable. Normal adrenal glands. Atrophic LEFT kidney Musculoskeletal: No aggressive osseous lesion. Degenerative osteophytosis of the spine. IMPRESSION: 1. Findings most consistent with RIGHT lower lobe bronchopneumonia. As there is a significant nodular component, recommend follow-up CT after antibiotic therapy to ensure resolution and exclude malignant nodularity (not favored). 2. Coronary artery calcification and Aortic Atherosclerosis (ICD10-I70.0). Electronically Signed   By: Suzy Bouchard M.D.   On: 09/03/2021 17:57   DG Chest Portable 1 View  Result Date: 09/03/2021 CLINICAL DATA:  Cough, shortness of breath. EXAM: PORTABLE CHEST 1 VIEW COMPARISON:  March 19, 2021. FINDINGS: Stable cardiomediastinal silhouette. Sternotomy wires are noted. Postoperative changes are noted in the right upper lobe. No acute pulmonary disease is noted. Bony thorax is unremarkable. IMPRESSION: No active disease. Electronically Signed   By: Marijo Conception M.D.   On: 09/03/2021 14:17    SIGNED: Deatra James, MD, FHM. [facility],  Pager (please use  [email or web address] to page/text) Please use Epic Secure Chat for non-urgent communication (7AM-7PM)  If 7PM-7AM, please contact night-coverage [email or web address], 09/04/2021, 11:41 AM

## 2021-09-04 NOTE — Care Management Important Message (Signed)
Important Message ? ?Patient Details  ?Name: Debra [name] ?MRN: [id number] ?Date of Birth: 1929-03-08 ? ? ?Medicare Important Message Given:  N/A - LOS <3 / Initial given by admissions ? ? ? ? ?Tommy Medal ?09/04/2021, 3:12 PM ?

## 2021-09-04 NOTE — Evaluation (Signed)
Occupational Therapy Evaluation ?Patient Details ?Name: Debra [name] ?MRN: [id number] ?DOB: [birth year] ?Today's Date: 09/04/2021 ? ? ?History of Present Illness Debra [name]  is a 88 y.o. female, with past medical history of hypertension, CKD, CAD, CHF with EF of 40%, diabetes, A-fib on Eliquis, patient presents to ED secondary to complaints of cough, patient report cough has been going on for the last week, productive with yellow phlegm, she does report some fever and chills, and congestion, she does report some mild dyspnea as well, denies any chest pain, hemoptysis, no coffee-ground emesis, she denies any sick contacts, no dysuria, no polyuria.  ? ?Clinical Impression ?  ?Pt agreeable to OT evaluation. Pt generally weak but able to complete mobility in room to sink and toilet using RW without assist. Pt stood at sink to brush teeth and transferred to the toilet with mod I level of assist. Pt reports 24/7 assist at home. Pt removed from supplemental O2 due to SpO2 at 98% without it and pt report of not using supplemental O2 at baseline. Pt is not recommended for further acute OT services and will be discharged to care of nursing staff for remaining length of stay.  ?   ? ?Recommendations for follow up therapy are one component of a multi-disciplinary discharge planning process, led by the attending physician.  Recommendations may be updated based on patient status, additional functional criteria and insurance authorization.  ? ?Follow Up Recommendations ? No OT follow up  ?  ?Assistance Recommended at Discharge PRN  ?Patient can return home with the following   ? ?  ?Functional Status Assessment ? Patient has not had a recent decline in their functional status  ?Equipment Recommendations ? None recommended by OT  ?  ?Recommendations for Other Services   ? ? ?  ?Precautions / Restrictions Precautions ?Precautions: None ?Restrictions ?Weight Bearing Restrictions: No  ? ?  ? ?Mobility Bed Mobility ?Overal bed mobility:  Modified Independent ?  ?  ?  ?  ?  ?  ?General bed mobility comments: Mild labored movement ?  ? ?Transfers ?Overall transfer level: Modified independent ?Equipment used: Rolling walker (2 wheels) ?  ?  ?  ?  ?  ?  ?  ?General transfer comment: Toilet transfer with Mod I level of assist. ?  ? ?  ?Balance Overall balance assessment: Needs assistance ?Sitting-balance support: No upper extremity supported, Feet supported ?Sitting balance-Leahy Scale: Good ?Sitting balance - Comments: seated EOB ?  ?Standing balance support: Bilateral upper extremity supported, During functional activity, Reliant on assistive device for balance ?Standing balance-Leahy Scale: Fair ?Standing balance comment: fair to good with RW ?  ?  ?  ?  ?  ?  ?  ?  ?  ?  ?  ?   ? ?ADL either performed or assessed with clinical judgement  ? ?ADL Overall ADL's : Modified independent ?  ?  ?  ?  ?  ?  ?  ?  ?  ?  ?  ?  ?  ?  ?  ?  ?  ?  ?  ?General ADL Comments: Mild labored movement but able to brush teeth at sink, doff and don socks, and transfer to toilet without assist.  ? ? ? ?Vision Baseline Vision/History: 1 Wears glasses ?Ability to See in Adequate Light: 1 Impaired ?Patient Visual Report: No change from baseline ?Vision Assessment?: No apparent visual deficits  ?   ?Perception   ?  ?Praxis   ?  ? ?  Pertinent Vitals/Pain Pain Assessment ?Pain Assessment: No/denies pain  ? ? ? ?Hand Dominance Right ?  ?Extremity/Trunk Assessment Upper Extremity Assessment ?Upper Extremity Assessment: Generalized weakness ?  ?Lower Extremity Assessment ?Lower Extremity Assessment: Defer to PT evaluation ?  ?Cervical / Trunk Assessment ?Cervical / Trunk Assessment: Kyphotic ?  ?Communication Communication ?Communication: No difficulties ?  ?Cognition Arousal/Alertness: Awake/alert ?Behavior During Therapy: [facility] for tasks assessed/performed ?Overall Cognitive Status: Within Functional Limits for tasks assessed ?  ?  ?  ?  ?  ?  ?  ?  ?  ?  ?  ?  ?  ?  ?  ?  ?  ?  ?  ?    ? ?  ?   ?  ?    ? ? ?Home Living Family/patient expects to be discharged to:: Private residence ?Living Arrangements: Children ?Available Help at Discharge: Family;Available 24 hours/day ?Type of Home: House ?Home Access: Ramped entrance ?  ?  ?Home Layout: One level ?  ?  ?Bathroom Shower/Tub: Tub/shower unit ?  ?Bathroom Toilet: Standard ?  ?  ?Home Equipment: Rollator (4 wheels);Shower seat;Wheelchair - manual ?  ?Additional Comments: Pt lives with daughter. ?  ? ?  ?Prior Functioning/Environment Prior Level of Function : Needs assist ?  ?  ?  ?Physical Assist : Mobility (physical);ADLs (physical) ?Mobility (physical): Transfers;Gait ?ADLs (physical): IADLs ?Mobility Comments: Pt reports household and community ambulation with use or rollator. ?ADLs Comments: Pt reports independence with ADL's and daughter assisting with IADL's but pt will go to grocery store with daughter. ?  ? ?  ?  ?OT Problem List:   ?  ?   ?OT Treatment/Interventions:    ?  ?OT Goals(Current goals can be found in the care plan section) Acute Rehab OT Goals ?Patient Stated Goal: return home  ?OT Frequency:   ?  ? ?Co-evaluation   ?  ?  ?  ?  ? ?  ?AM-PAC OT "6 Clicks" Daily Activity     ?Outcome Measure Help from another person eating meals?: None ?Help from another person taking care of personal grooming?: None ?Help from another person toileting, which includes using toliet, bedpan, or urinal?: None ?Help from another person bathing (including washing, rinsing, drying)?: None ?Help from another person to put on and taking off regular upper body clothing?: None ?Help from another person to put on and taking off regular lower body clothing?: None ?6 Click Score: 24 ?  ?End of Session Equipment Utilized During Treatment: Rolling walker (2 wheels) ?Nurse Communication: Mobility status ? ?Activity Tolerance: Patient tolerated treatment well ?Patient left: Other (comment) (Pt seated on toilet at end of session with cuing to use toilet alarm if  she needed assist.) ? ?OT Visit Diagnosis: Unsteadiness on feet (R26.81);Muscle weakness (generalized) (M62.81)  ?              ?Time: 5465-6812 ?OT Time Calculation (min): 21 min ?Charges:  OT General Charges ?$OT Visit: 1 Visit ?OT Evaluation ?$OT Eval Low Complexity: 1 Low ? ?Larey Seat OT, MOT ? ?Larey Seat ?09/04/2021, 9:34 AM ?

## 2021-09-04 NOTE — Progress Notes (Signed)
?  Transition of Care (TOC) Screening Note ? ? ?Patient Details  ?Name: Debra [name] ?Date of Birth: Aug 03, 1928 ? ? ?Transition of Care (TOC) CM/SW Contact:    ?Iona Beard, LCSWA ?Phone Number: ?09/04/2021, 1:55 PM ? ? ? ?Transition of Care Department [facility]) has reviewed patient and no TOC needs have been identified at this time. We will continue to monitor patient advancement through interdisciplinary progression rounds. If new patient transition needs arise, please place a TOC consult. ?  ?

## 2021-09-05 ENCOUNTER — Inpatient Hospital Stay (HOSPITAL_COMMUNITY): Payer: Medicare HMO

## 2021-09-05 DIAGNOSIS — J45909 Unspecified asthma, uncomplicated: Secondary | ICD-10-CM

## 2021-09-05 DIAGNOSIS — J189 Pneumonia, unspecified organism: Secondary | ICD-10-CM | POA: Diagnosis not present

## 2021-09-05 DIAGNOSIS — I2510 Atherosclerotic heart disease of native coronary artery without angina pectoris: Secondary | ICD-10-CM | POA: Diagnosis not present

## 2021-09-05 DIAGNOSIS — R0600 Dyspnea, unspecified: Secondary | ICD-10-CM | POA: Diagnosis not present

## 2021-09-05 LAB — CBC
HCT: 32.2 % — ABNORMAL LOW (ref 36.0–46.0)
Hemoglobin: 10.1 g/dL — ABNORMAL LOW (ref 12.0–15.0)
MCH: 31.9 pg (ref 26.0–34.0)
MCHC: 31.4 g/dL (ref 30.0–36.0)
MCV: 101.6 fL — ABNORMAL HIGH (ref 80.0–100.0)
Platelets: 171 10*3/uL (ref 150–400)
RBC: 3.17 MIL/uL — ABNORMAL LOW (ref 3.87–5.11)
RDW: 14.1 % (ref 11.5–15.5)
WBC: 11 10*3/uL — ABNORMAL HIGH (ref 4.0–10.5)
nRBC: 0 % (ref 0.0–0.2)

## 2021-09-05 LAB — BASIC METABOLIC PANEL
Anion gap: 8 (ref 5–15)
BUN: 37 mg/dL — ABNORMAL HIGH (ref 8–23)
CO2: 24 mmol/L (ref 22–32)
Calcium: 9 mg/dL (ref 8.9–10.3)
Chloride: 106 mmol/L (ref 98–111)
Creatinine, Ser: 1.49 mg/dL — ABNORMAL HIGH (ref 0.44–1.00)
GFR, Estimated: 33 mL/min — ABNORMAL LOW (ref >60)
Glucose, Bld: 161 mg/dL — ABNORMAL HIGH (ref 70–99)
Potassium: 4.1 mmol/L (ref 3.5–5.1)
Sodium: 138 mmol/L (ref 135–145)

## 2021-09-05 MED ORDER — BENZONATATE 100 MG PO CAPS
200.0000 mg | ORAL_CAPSULE | Freq: Three times a day (TID) | ORAL | Status: DC
  Administered 2021-09-05 – 2021-09-10 (×16): 200 mg via ORAL
  Filled 2021-09-05 (×15): qty 2

## 2021-09-05 MED ORDER — TRAZODONE HCL 50 MG PO TABS
50.0000 mg | ORAL_TABLET | Freq: Every day | ORAL | Status: DC
  Administered 2021-09-05 – 2021-09-09 (×5): 50 mg via ORAL
  Filled 2021-09-05 (×5): qty 1

## 2021-09-05 MED ORDER — OXYCODONE HCL 5 MG PO TABS
5.0000 mg | ORAL_TABLET | Freq: Four times a day (QID) | ORAL | Status: DC | PRN
Start: 2021-09-05 — End: 2021-09-10
  Administered 2021-09-05 – 2021-09-10 (×8): 5 mg via ORAL
  Filled 2021-09-05 (×10): qty 1

## 2021-09-05 MED ORDER — TRAMADOL HCL 50 MG PO TABS
50.0000 mg | ORAL_TABLET | Freq: Four times a day (QID) | ORAL | Status: DC | PRN
Start: 2021-09-05 — End: 2021-09-06
  Administered 2021-09-05: 50 mg via ORAL
  Filled 2021-09-05: qty 1

## 2021-09-05 MED ORDER — GUAIFENESIN-DM 100-10 MG/5ML PO SYRP
5.0000 mL | ORAL_SOLUTION | Freq: Three times a day (TID) | ORAL | Status: DC
  Administered 2021-09-05 – 2021-09-07 (×5): 5 mL via ORAL
  Filled 2021-09-05 (×6): qty 5

## 2021-09-05 MED ORDER — FUROSEMIDE 10 MG/ML IJ SOLN
20.0000 mg | Freq: Once | INTRAMUSCULAR | Status: AC
  Administered 2021-09-05: 20 mg via INTRAVENOUS
  Filled 2021-09-05: qty 2

## 2021-09-05 NOTE — Progress Notes (Signed)
PROGRESS NOTE    Patient: Debra [name]                            PCP: Sharilyn Sites, MD                    DOB: 01/12/1929            DOA: 09/03/2021 NKN:[id number]             DOS: 09/05/2021, 1:01 PM   LOS: 1 day   Date of Service: The patient was seen and examined on 09/05/2021  Subjective:   The patient was seen and examined this morning, restless sitting in bed, daughter present at bedside  Stating she did not sleep well overnight, she was found restless, had to be placed back on 3 L of oxygen, is currently satting 100% Reporting a mild shortness of breath but denies any chest pain Complaining of cough and chronic pain  Brief Narrative:    Calea Hribar  is a 88 y.o. female, with past medical history of hypertension, CKD, CAD, CHF with EF of 40%, diabetes, A-fib on Eliquis, patient presents to ED secondary to complaints of cough, patient report cough has been going on for the last week, productive with yellow phlegm, she does report some fever and chills, and congestion, she does report some mild dyspnea as well, denies any chest pain, hemoptysis, no coffee-ground emesis, she denies any sick contacts, no dysuria, no polyuria. -In ED patient was dyspneic, initial x-ray with no evidence of pneumonia, but CT confirmed pneumonia, creatinine at 1.65, troponins 184>> 222, she denies any chest pain, no EKG changes, patient was started on IV Rocephin and azithromycin    Assessment & Plan:   Principal Problem:   Dyspnea Active Problems:   CAP (community acquired pneumonia)   Asthma   Hypertension   Chronic renal insufficiency, stage 3 (moderate) (HCC)   Type 2 diabetes mellitus (HCC)   CAD (coronary artery disease)   GERD (gastroesophageal reflux disease)   PNA (pneumonia)     Assessment and Plan:   Community-acquired pneumonia   Planing of shortness of breath, was placed back on 3 L of oxygen, currently satting 100% -Patient complaining of cough with sputum production  yellowish in color -Resident's list due to shortness of breath but denies any chest pain   -She presents with significant cough, congestion, CT chest significant for right flank pneumonia -Under treatment with pneumonia pathway -Continue current antibiotics of azithromycin and Rocephin follow-up with sputum culture, Legionella and strep pneumonia antigen -With significant cough, phlegm and congestion, she was encouraged use incentive spirometer, flutter valve, started on Mucinex -We will need repeat imaging in 4 to 6 weeks to ensure resolution of pneumonia.  -Continue breathing treatment, continue O2 supplement    History of A-fib -Currently stable -Continue with Eliquis for anticoagulation -Continue with amiodarone -Mild tachycardia overnight, currently HR 379   Chronic systolic CHF -No evidence of volume overload, resuming home medication Lasix -continue with Entresto -Stable   History of hypothyroidism -Continue with Synthroid -Stable   Elevated troponins -This is likely due to demand ischemia and hypoxia, with baseline CKD  -Troponin 184, 222 -EKG nonacute --Continue denies having any chest pain   Hypertension -Continue with home medications -Remained stable   Type 2 diabetes mellitus -Does not appear to be on any home medications, -We will check her CBG QA CHS with SSI coverage  CKD stage  III  -Monitor renal function closely  -Creatinine 1.65 > 1.35 >> 1.49  History of COPD /asthma  -With some wheezing, continue with as needed albuterol   --------------------------------------------------------------------------------------------------------------------------------------- Nutritional status:  The patient's BMI is: Body mass index is 17.06 kg/m. I agree with the assessment and plan as outlined   ----------------------------------------------------------------------------------------------------- Cultures; Blood Cultures x 2 >>  Sputum Culture >>    ---------------------------------------------------------------------------------------------------------------------------------  DVT prophylaxis:  apixaban (ELIQUIS) tablet 2.5 mg Start: 09/03/21 2300 apixaban (ELIQUIS) tablet 2.5 mg   Code Status:   Code Status: Full Code  Family Communication: -Discussed with patient, and her daughter at bedside -updated 3/4  The above findings and plan of care has been discussed with patient (and family)  in detail,  they expressed understanding and agreement of above. -Advance care planning has been discussed.   Admission status:   Status is: Observation The patient remains OBS appropriate and will d/c before 2 midnights.           Procedures:   No admission procedures for hospital encounter.   Antimicrobials:  Anti-infectives (From admission, onward)    Start     Dose/Rate Route Frequency Ordered Stop   09/04/21 1600  cefTRIAXone (ROCEPHIN) 2 g in sodium chloride 0.9 % 100 mL IVPB        2 g 200 mL/hr over 30 Minutes Intravenous Every 24 hours 09/03/21 1759     09/03/21 1800  azithromycin (ZITHROMAX) 500 mg in sodium chloride 0.9 % 250 mL IVPB        500 mg 250 mL/hr over 60 Minutes Intravenous Every 24 hours 09/03/21 1759     09/03/21 1515  cefTRIAXone (ROCEPHIN) 2 g in sodium chloride 0.9 % 100 mL IVPB        2 g 200 mL/hr over 30 Minutes Intravenous  Once 09/03/21 1513 09/03/21 1644        Medication:   amiodarone  100 mg Oral Daily   apixaban  2.5 mg Oral BID   atorvastatin  80 mg Oral q1800   benzonatate  200 mg Oral TID   cholecalciferol  5,000 Units Oral Daily   gabapentin  100 mg Oral TID   guaiFENesin-dextromethorphan  5 mL Oral Q8H   levothyroxine  125 mcg Oral Daily   sacubitril-valsartan  1 tablet Oral BID   traZODone  50 mg Oral QHS    acetaminophen **OR** acetaminophen, albuterol, chlorpheniramine-HYDROcodone, diazepam, methocarbamol (ROBAXIN) IV, ondansetron **OR** ondansetron (ZOFRAN) IV,  oxyCODONE, traMADol   Objective:   Vitals:   09/05/21 0415 09/05/21 1025 09/05/21 1140 09/05/21 1234  BP:  (!) 164/86 (!) 169/85   Pulse: 89 78 (!) 104   Resp:  18 20   Temp:      TempSrc:      SpO2:  100% 100% 98%  Weight:      Height:        Intake/Output Summary (Last 24 hours) at 09/05/2021 1301 Last data filed at 09/05/2021 1610 Gross per 24 hour  Intake 830 ml  Output --  Net 830 ml   Filed Weights   09/03/21 1347  Weight: 49.4 kg     Examination:      Physical Exam:   General:  Alert, oriented, cooperative, restless, had to be placed back on supplemental oxygen overnight  HEENT:  Normocephalic, PERRL, otherwise with in Normal limits   Neuro:  CNII-XII intact. , normal motor and sensation, reflexes intact   Lungs:   Clear to auscultation BL, Respirations unlabored, no  wheezes / crackles  Cardio:    S1/S2, RRR, No murmure, No Rubs or Gallops   Abdomen:   Soft, non-tender, bowel sounds active all four quadrants,  no guarding or peritoneal signs.  Muscular skeletal:  Limited exam - in bed, able to move all 4 extremities, Normal strength,  2+ pulses,  symmetric, No pitting edema  Skin:  Dry, warm to touch, negative for any Rashes,  Wounds: Please see nursing documentation          LABs:  CBC Latest Ref Rng & Units 09/05/2021 09/04/2021 09/03/2021  WBC 4.0 - 10.5 K/uL 11.0(H) 11.5(H) 12.8(H)  Hemoglobin 12.0 - 15.0 g/dL 10.1(L) 10.1(L) 11.6(L)  Hematocrit 36.0 - 46.0 % 32.2(L) 31.0(L) 35.9(L)  Platelets 150 - 400 K/uL 171 152 177   CMP Latest Ref Rng & Units 09/05/2021 09/04/2021 09/03/2021  Glucose 70 - 99 mg/dL 161(H) 109(H) 144(H)  BUN 8 - 23 mg/dL 37(H) 31(H) 35(H)  Creatinine 0.44 - 1.00 mg/dL 1.49(H) 1.35(H) 1.65(H)  Sodium 135 - 145 mmol/L 138 137 135  Potassium 3.5 - 5.1 mmol/L 4.1 4.3 3.6  Chloride 98 - 111 mmol/L 106 106 100  CO2 22 - 32 mmol/L '24 22 26  '$ Calcium 8.9 - 10.3 mg/dL 9.0 8.6(L) 8.6(L)  Total Protein 6.5 - 8.1 g/dL - - 6.8  Total Bilirubin  0.3 - 1.2 mg/dL - - 0.6  Alkaline Phos 38 - 126 U/L - - 73  AST 15 - 41 U/L - - 21  ALT 0 - 44 U/L - - 14       Micro Results Recent Results (from the past 240 hour(s))  Blood culture (routine x 2)     Status: None (Preliminary result)   Collection Time: 09/03/21  1:55 PM   Specimen: Right Antecubital; Blood  Result Value Ref Range Status   Specimen Description RIGHT ANTECUBITAL  Final   Special Requests   Final    BOTTLES DRAWN AEROBIC AND ANAEROBIC Blood Culture adequate volume   Culture   Final    NO GROWTH 2 DAYS Performed at [facility], [address] [place],  [address]    Report Status PENDING  Incomplete  Resp Panel by RT-PCR (Flu A&B, Covid) Nasopharyngeal Swab     Status: None   Collection Time: 09/03/21  1:55 PM   Specimen: Nasopharyngeal Swab; Nasopharyngeal(NP) swabs in vial transport medium  Result Value Ref Range Status   SARS Coronavirus 2 by RT PCR NEGATIVE NEGATIVE Final    Comment: (NOTE) SARS-CoV-2 target nucleic acids are NOT DETECTED.  The SARS-CoV-2 RNA is generally detectable in upper respiratory specimens during the acute phase of infection. The lowest concentration of SARS-CoV-2 viral copies this assay can detect is 138 copies/mL. A negative result does not preclude SARS-Cov-2 infection and should not be used as the sole basis for treatment or other patient management decisions. A negative result may occur with  improper specimen collection/handling, submission of specimen other than nasopharyngeal swab, presence of viral mutation(s) within the areas targeted by this assay, and inadequate number of viral copies(<138 copies/mL). A negative result must be combined with clinical observations, patient history, and epidemiological information. The expected result is Negative.  Fact Sheet for Patients:  EntrepreneurPulse.com.au  Fact Sheet for Healthcare Providers:  IncredibleEmployment.be  This test is  no t yet approved or cleared by the [place] FDA and  has been authorized for detection and/or diagnosis of SARS-CoV-2 by FDA under an Emergency Use Authorization (EUA). This EUA will remain  in effect (meaning this test can be used) for the duration of the COVID-19 declaration under Section 564(b)(1) of the Act, 21 U.S.C.section 360bbb-3(b)(1), unless the authorization is terminated  or revoked sooner.       Influenza A by PCR NEGATIVE NEGATIVE Final   Influenza B by PCR NEGATIVE NEGATIVE Final    Comment: (NOTE) The Xpert Xpress SARS-CoV-2/FLU/RSV plus assay is intended as an aid in the diagnosis of influenza from Nasopharyngeal swab specimens and should not be used as a sole basis for treatment. Nasal washings and aspirates are unacceptable for Xpert Xpress SARS-CoV-2/FLU/RSV testing.  Fact Sheet for Patients: EntrepreneurPulse.com.au  Fact Sheet for Healthcare Providers: IncredibleEmployment.be  This test is not yet approved or cleared by the [place] FDA and has been authorized for detection and/or diagnosis of SARS-CoV-2 by FDA under an Emergency Use Authorization (EUA). This EUA will remain in effect (meaning this test can be used) for the duration of the COVID-19 declaration under Section 564(b)(1) of the Act, 21 U.S.C. section 360bbb-3(b)(1), unless the authorization is terminated or revoked.  Performed at [facility], [address], [place], [place] [address]   Blood culture (routine x 2)     Status: None (Preliminary result)   Collection Time: 09/03/21  2:00 PM   Specimen: Left Antecubital; Blood  Result Value Ref Range Status   Specimen Description LEFT ANTECUBITAL  Final   Special Requests   Final    BOTTLES DRAWN AEROBIC AND ANAEROBIC Blood Culture adequate volume   Culture   Final    NO GROWTH 2 DAYS Performed at [facility], [address], [place], [place] [address]    Report Status PENDING  Incomplete     Radiology Reports No results found.  SIGNED: Deatra James, MD, FHM. [facility],  Pager (please use [email or web address] to page/text) Please use Epic Secure Chat for non-urgent communication (7AM-7PM)  If 7PM-7AM, please contact night-coverage [email or web address], 09/05/2021, 1:01 PM

## 2021-09-05 NOTE — Progress Notes (Signed)
Pt called to nurses station stating having trouble breathing. In to room to find pt A&O, talkative, no increased WOB. Breath sounds diminished with fine crackles at bases. SaO2 100% on 3 lpm [place], HR 104, confirmed on tele monitor and apically, cap refill brisk. Pt complaining of pain in her feet and legs, asking for pain medication. Pt also asking for nebulizer treatment. Respiratory therapy notified for prn neb tx. PRN pain med administered for pain rated 9/10. ?

## 2021-09-06 DIAGNOSIS — I2510 Atherosclerotic heart disease of native coronary artery without angina pectoris: Secondary | ICD-10-CM | POA: Diagnosis not present

## 2021-09-06 DIAGNOSIS — N183 Chronic kidney disease, stage 3 unspecified: Secondary | ICD-10-CM | POA: Diagnosis not present

## 2021-09-06 DIAGNOSIS — J189 Pneumonia, unspecified organism: Secondary | ICD-10-CM | POA: Diagnosis not present

## 2021-09-06 DIAGNOSIS — R0600 Dyspnea, unspecified: Secondary | ICD-10-CM | POA: Diagnosis not present

## 2021-09-06 LAB — CBC
HCT: 29.7 % — ABNORMAL LOW (ref 36.0–46.0)
Hemoglobin: 9.4 g/dL — ABNORMAL LOW (ref 12.0–15.0)
MCH: 32.5 pg (ref 26.0–34.0)
MCHC: 31.6 g/dL (ref 30.0–36.0)
MCV: 102.8 fL — ABNORMAL HIGH (ref 80.0–100.0)
Platelets: 173 10*3/uL (ref 150–400)
RBC: 2.89 MIL/uL — ABNORMAL LOW (ref 3.87–5.11)
RDW: 14.2 % (ref 11.5–15.5)
WBC: 12.7 10*3/uL — ABNORMAL HIGH (ref 4.0–10.5)
nRBC: 0 % (ref 0.0–0.2)

## 2021-09-06 LAB — BASIC METABOLIC PANEL
Anion gap: 8 (ref 5–15)
BUN: 48 mg/dL — ABNORMAL HIGH (ref 8–23)
CO2: 25 mmol/L (ref 22–32)
Calcium: 8.9 mg/dL (ref 8.9–10.3)
Chloride: 107 mmol/L (ref 98–111)
Creatinine, Ser: 1.96 mg/dL — ABNORMAL HIGH (ref 0.44–1.00)
GFR, Estimated: 24 mL/min — ABNORMAL LOW (ref >60)
Glucose, Bld: 177 mg/dL — ABNORMAL HIGH (ref 70–99)
Potassium: 4.4 mmol/L (ref 3.5–5.1)
Sodium: 140 mmol/L (ref 135–145)

## 2021-09-06 MED ORDER — SODIUM CHLORIDE 0.9 % IV SOLN
INTRAVENOUS | Status: AC
Start: 2021-09-06 — End: 2021-09-07

## 2021-09-06 MED ORDER — AZITHROMYCIN 250 MG PO TABS
500.0000 mg | ORAL_TABLET | Freq: Every day | ORAL | Status: DC
  Administered 2021-09-06 – 2021-09-08 (×3): 500 mg via ORAL
  Filled 2021-09-06 (×3): qty 2

## 2021-09-06 MED ORDER — LEVALBUTEROL HCL 1.25 MG/0.5ML IN NEBU
1.2500 mg | INHALATION_SOLUTION | Freq: Three times a day (TID) | RESPIRATORY_TRACT | Status: DC
  Administered 2021-09-06 (×2): 1.25 mg via RESPIRATORY_TRACT
  Filled 2021-09-06 (×2): qty 0.5

## 2021-09-06 MED ORDER — SACUBITRIL-VALSARTAN 49-51 MG PO TABS
1.0000 | ORAL_TABLET | Freq: Two times a day (BID) | ORAL | Status: DC
  Administered 2021-09-08 (×2): 1 via ORAL
  Filled 2021-09-06 (×2): qty 1

## 2021-09-06 MED ORDER — IPRATROPIUM BROMIDE 0.02 % IN SOLN
0.5000 mg | Freq: Three times a day (TID) | RESPIRATORY_TRACT | Status: DC
  Administered 2021-09-06 (×2): 0.5 mg via RESPIRATORY_TRACT
  Filled 2021-09-06 (×2): qty 2.5

## 2021-09-06 MED ORDER — IPRATROPIUM BROMIDE 0.02 % IN SOLN
0.5000 mg | Freq: Two times a day (BID) | RESPIRATORY_TRACT | Status: DC
  Administered 2021-09-07 – 2021-09-10 (×7): 0.5 mg via RESPIRATORY_TRACT
  Filled 2021-09-06 (×7): qty 2.5

## 2021-09-06 MED ORDER — LEVALBUTEROL HCL 1.25 MG/0.5ML IN NEBU
1.2500 mg | INHALATION_SOLUTION | Freq: Two times a day (BID) | RESPIRATORY_TRACT | Status: DC
  Administered 2021-09-07 – 2021-09-10 (×7): 1.25 mg via RESPIRATORY_TRACT
  Filled 2021-09-06 (×7): qty 0.5

## 2021-09-06 NOTE — Progress Notes (Signed)
SATURATION QUALIFICATIONS: (This note is used to comply with regulatory documentation for home oxygen) ? ?Patient Saturations on Room Air at Rest = 88% ? ?Patient Saturations on Room Air while Ambulating = 82% ? ?Patient Saturations on 2 Liters of oxygen while Ambulating = 92% ? ?Please briefly explain why patient needs home oxygen: patient needs continuous o2 at 2L to maintain oxygenation ? ?

## 2021-09-06 NOTE — Progress Notes (Signed)
PROGRESS NOTE    Patient: Debra [name]                            PCP: Sharilyn Sites, MD                    DOB: 05/18/1929            DOA: 09/03/2021 [id number]             DOS: 09/06/2021, 12:09 PM   LOS: 2 days   Date of Service: The patient was seen and examined on 09/06/2021  Subjective:   The patient was seen and examined this morning, found lethargic, otherwise awake alert following commands Still needing 3 L of oxygen -currently satting 100%, Visibly mild shortness of breath and cough noted  Brief Narrative:    Debra [name]  is a 88 y.o. female, with past medical history of hypertension, CKD, CAD, CHF with EF of 40%, diabetes, A-fib on Eliquis, patient presents to ED secondary to complaints of cough, patient report cough has been going on for the last week, productive with yellow phlegm, she does report some fever and chills, and congestion, she does report some mild dyspnea as well, denies any chest pain, hemoptysis, no coffee-ground emesis, she denies any sick contacts, no dysuria, no polyuria. -In ED patient was dyspneic, initial x-ray with no evidence of pneumonia, but CT confirmed pneumonia, creatinine at 1.65, troponins 184>> 222, she denies any chest pain, no EKG changes, patient was started on IV Rocephin and azithromycin    Assessment & Plan:   Principal Problem:   Dyspnea Active Problems:   CAP (community acquired pneumonia)   Asthma   Hypertension   Chronic renal insufficiency, stage 3 (moderate) (HCC)   Type 2 diabetes mellitus (HCC)   CAD (coronary artery disease)   GERD (gastroesophageal reflux disease)   PNA (pneumonia)     Assessment and Plan:   Community-acquired pneumonia   -Still requiring 3 L of oxygen by nasal cannula, satting 100% -Mild wheezing cough and shortness of breath noted -Chest x-ray 09/05/2021 revealed cardiomegaly no new infiltrateChest x-ray 09/05/2021 revealed cardiomegaly no new infiltrate  -She presents with significant  cough, congestion, CT chest significant for right flank pneumonia  -Will Continue current antibiotics of azithromycin and Rocephin -Blood/sputum cultures >> no growth to date -Encouraged use incentive spirometer, flutter valve, started on Mucinex -We will need repeat imaging in 4 to 6 weeks to ensure resolution of pneumonia.  -Continue breathing treatment, continue O2 supplement    History of A-fib -Currently stable -Continue with Eliquis for anticoagulation -Continue with amiodarone -Mild tachycardia overnight, currently HR 956   Chronic systolic CHF -No evidence of volume overload, resuming home medication Lasix -continue with Entresto -Stable -Chest x-ray reviewed 3/4 -no signs of volume overload  History of hypothyroidism -Continue with Synthroid Stable   Elevated troponins -This is likely due to demand ischemia and hypoxia, with baseline CKD  -Troponin 184, 222 -EKG nonacute -Denies any chest pain   Hypertension -Continue with home medications -Stable   Type 2 diabetes mellitus -Does not appear to be on any home medications, -We will check her CBG QA CHS with SSI coverage  CKD stage III  -Monitor renal function closely  -Creatinine 1.65 > 1.35 >> 1.49 >> 1.96  History of COPD /asthma  -With some wheezing, continue with as needed albuterol   --------------------------------------------------------------------------------------------------------------------------------------- Nutritional status:  The patient's BMI is: Body  mass index is 17.06 kg/m. I agree with the assessment and plan as outlined   ----------------------------------------------------------------------------------------------------- Cultures; Blood Cultures x 2 >> no growth to date Sputum Culture >> no growth to date  ---------------------------------------------------------------------------------------------------------------------------------  DVT prophylaxis:  apixaban (ELIQUIS)  tablet 2.5 mg Start: 09/03/21 2300 apixaban (ELIQUIS) tablet 2.5 mg   Code Status:   Code Status: Full Code  Family Communication: -Discussed with patient, and her daughter at bedside -updated 3/4  The above findings and plan of care has been discussed with patient (and family)  in detail,  they expressed understanding and agreement of above. -Advance care planning has been discussed.   Admission status:   Status is: Observation The patient remains OBS appropriate and will d/c before 2 midnights.           Procedures:   No admission procedures for hospital encounter.   Antimicrobials:  Anti-infectives (From admission, onward)    Start     Dose/Rate Route Frequency Ordered Stop   09/06/21 1300  azithromycin (ZITHROMAX) tablet 500 mg        500 mg Oral Daily 09/06/21 1208     09/04/21 1600  cefTRIAXone (ROCEPHIN) 2 g in sodium chloride 0.9 % 100 mL IVPB        2 g 200 mL/hr over 30 Minutes Intravenous Every 24 hours 09/03/21 1759     09/03/21 1800  azithromycin (ZITHROMAX) 500 mg in sodium chloride 0.9 % 250 mL IVPB  Status:  Discontinued        500 mg 250 mL/hr over 60 Minutes Intravenous Every 24 hours 09/03/21 1759 09/06/21 1208   09/03/21 1515  cefTRIAXone (ROCEPHIN) 2 g in sodium chloride 0.9 % 100 mL IVPB        2 g 200 mL/hr over 30 Minutes Intravenous  Once 09/03/21 1513 09/03/21 1644        Medication:   amiodarone  100 mg Oral Daily   apixaban  2.5 mg Oral BID   atorvastatin  80 mg Oral q1800   azithromycin  500 mg Oral Daily   benzonatate  200 mg Oral TID   cholecalciferol  5,000 Units Oral Daily   gabapentin  100 mg Oral TID   guaiFENesin-dextromethorphan  5 mL Oral Q8H   ipratropium  0.5 mg Nebulization Q8H   levalbuterol  1.25 mg Nebulization Q8H   levothyroxine  125 mcg Oral Daily   [START ON 09/08/2021] sacubitril-valsartan  1 tablet Oral BID   traZODone  50 mg Oral QHS    acetaminophen **OR** acetaminophen, albuterol,  chlorpheniramine-HYDROcodone, diazepam, methocarbamol (ROBAXIN) IV, ondansetron **OR** ondansetron (ZOFRAN) IV, oxyCODONE   Objective:   Vitals:   09/05/21 1234 09/05/21 1408 09/05/21 2119 09/06/21 0514  BP:  (!) 159/79 (!) 116/59 112/70  Pulse:  85 83 74  Resp:  '18 16 17  '$ Temp:  98 F (36.7 C) 97.8 F (36.6 C) 98.6 F (37 C)  TempSrc:  Oral Oral Oral  SpO2: 98% 98% 100% 100%  Weight:      Height:        Intake/Output Summary (Last 24 hours) at 09/06/2021 1209 Last data filed at 09/06/2021 0830 Gross per 24 hour  Intake 240 ml  Output 350 ml  Net -110 ml   Filed Weights   09/03/21 1347  Weight: 49.4 kg     Examination:      General:  Alert, oriented, cooperative, no distress;   HEENT:  Normocephalic, PERRL, otherwise with in Normal limits   Neuro:  CNII-XII intact. , normal motor and sensation, reflexes intact   Lungs:   Clear to auscultation BL, Respirations unlabored, mild diffuse lower lobe wheezing,  Cardio:    S1/S2, RRR, No murmure, No Rubs or Gallops   Abdomen:   Soft, non-tender, bowel sounds active all four quadrants,  no guarding or peritoneal signs.  Muscular skeletal:  Limited exam - in bed, able to move all 4 extremities, Normal strength,  2+ pulses,  symmetric, No pitting edema  Skin:  Dry, warm to touch, negative for any Rashes,  Wounds: Please see nursing documentation      LABs:  CBC Latest Ref Rng & Units 09/06/2021 09/05/2021 09/04/2021  WBC 4.0 - 10.5 K/uL 12.7(H) 11.0(H) 11.5(H)  Hemoglobin 12.0 - 15.0 g/dL 9.4(L) 10.1(L) 10.1(L)  Hematocrit 36.0 - 46.0 % 29.7(L) 32.2(L) 31.0(L)  Platelets 150 - 400 K/uL 173 171 152   CMP Latest Ref Rng & Units 09/06/2021 09/05/2021 09/04/2021  Glucose 70 - 99 mg/dL 177(H) 161(H) 109(H)  BUN 8 - 23 mg/dL 48(H) 37(H) 31(H)  Creatinine 0.44 - 1.00 mg/dL 1.96(H) 1.49(H) 1.35(H)  Sodium 135 - 145 mmol/L 140 138 137  Potassium 3.5 - 5.1 mmol/L 4.4 4.1 4.3  Chloride 98 - 111 mmol/L 107 106 106  CO2 22 - 32 mmol/L '25 24  22  '$ Calcium 8.9 - 10.3 mg/dL 8.9 9.0 8.6(L)  Total Protein 6.5 - 8.1 g/dL - - -  Total Bilirubin 0.3 - 1.2 mg/dL - - -  Alkaline Phos 38 - 126 U/L - - -  AST 15 - 41 U/L - - -  ALT 0 - 44 U/L - - -       Micro Results Recent Results (from the past 240 hour(s))  Blood culture (routine x 2)     Status: None (Preliminary result)   Collection Time: 09/03/21  1:55 PM   Specimen: Right Antecubital; Blood  Result Value Ref Range Status   Specimen Description RIGHT ANTECUBITAL  Final   Special Requests   Final    BOTTLES DRAWN AEROBIC AND ANAEROBIC Blood Culture adequate volume   Culture   Final    NO GROWTH 2 DAYS Performed at [facility], [address], [place], [place] [address]    Report Status PENDING  Incomplete  Resp Panel by RT-PCR (Flu A&B, Covid) Nasopharyngeal Swab     Status: None   Collection Time: 09/03/21  1:55 PM   Specimen: Nasopharyngeal Swab; Nasopharyngeal(NP) swabs in vial transport medium  Result Value Ref Range Status   SARS Coronavirus 2 by RT PCR NEGATIVE NEGATIVE Final    Comment: (NOTE) SARS-CoV-2 target nucleic acids are NOT DETECTED.  The SARS-CoV-2 RNA is generally detectable in upper respiratory specimens during the acute phase of infection. The lowest concentration of SARS-CoV-2 viral copies this assay can detect is 138 copies/mL. A negative result does not preclude SARS-Cov-2 infection and should not be used as the sole basis for treatment or other patient management decisions. A negative result may occur with  improper specimen collection/handling, submission of specimen other than nasopharyngeal swab, presence of viral mutation(s) within the areas targeted by this assay, and inadequate number of viral copies(<138 copies/mL). A negative result must be combined with clinical observations, patient history, and epidemiological information. The expected result is Negative.  Fact Sheet for Patients:   EntrepreneurPulse.com.au  Fact Sheet for Healthcare Providers:  IncredibleEmployment.be  This test is no t yet approved or cleared by the [place] and  has been authorized  for detection and/or diagnosis of SARS-CoV-2 by FDA under an Emergency Use Authorization (EUA). This EUA will remain  in effect (meaning this test can be used) for the duration of the COVID-19 declaration under Section 564(b)(1) of the Act, 21 U.S.C.section 360bbb-3(b)(1), unless the authorization is terminated  or revoked sooner.       Influenza A by PCR NEGATIVE NEGATIVE Final   Influenza B by PCR NEGATIVE NEGATIVE Final    Comment: (NOTE) The Xpert Xpress SARS-CoV-2/FLU/RSV plus assay is intended as an aid in the diagnosis of influenza from Nasopharyngeal swab specimens and should not be used as a sole basis for treatment. Nasal washings and aspirates are unacceptable for Xpert Xpress SARS-CoV-2/FLU/RSV testing.  Fact Sheet for Patients: EntrepreneurPulse.com.au  Fact Sheet for Healthcare Providers: IncredibleEmployment.be  This test is not yet approved or cleared by the [place] FDA and has been authorized for detection and/or diagnosis of SARS-CoV-2 by FDA under an Emergency Use Authorization (EUA). This EUA will remain in effect (meaning this test can be used) for the duration of the COVID-19 declaration under Section 564(b)(1) of the Act, 21 U.S.C. section 360bbb-3(b)(1), unless the authorization is terminated or revoked.  Performed at [facility], [address], [place], [place] [address]   Blood culture (routine x 2)     Status: None (Preliminary result)   Collection Time: 09/03/21  2:00 PM   Specimen: Left Antecubital; Blood  Result Value Ref Range Status   Specimen Description LEFT ANTECUBITAL  Final   Special Requests   Final    BOTTLES DRAWN AEROBIC AND ANAEROBIC Blood Culture adequate volume   Culture    Final    NO GROWTH 2 DAYS Performed at [facility], [address], [place], [place] [address]    Report Status PENDING  Incomplete    Radiology Reports DG CHEST PORT 1 VIEW  Result Date: 09/05/2021 CLINICAL DATA:  Shortness of breath EXAM: PORTABLE CHEST 1 VIEW COMPARISON:  09/03/2021 FINDINGS: Transverse diameter of heart is increased. Metallic sutures are seen in the sternum. There are no signs of alveolar pulmonary edema or new focal infiltrates. There is elevation of minor fissure. Linear densities in the right upper lung fields suggest scarring with no significant interval change. Surgical staples are seen in the right upper lung fields. IMPRESSION: Cardiomegaly. There are no new infiltrates or signs of pulmonary edema. Electronically Signed   By: Elmer Picker M.D.   On: 09/05/2021 13:37    SIGNED: Deatra James, MD, FHM. [facility],  Pager (please use [email or web address] to page/text) Please use Epic Secure Chat for non-urgent communication (7AM-7PM)  If 7PM-7AM, please contact night-coverage [email or web address], 09/06/2021, 12:09 PM

## 2021-09-07 ENCOUNTER — Inpatient Hospital Stay (HOSPITAL_COMMUNITY): Payer: Medicare HMO

## 2021-09-07 DIAGNOSIS — R0600 Dyspnea, unspecified: Secondary | ICD-10-CM | POA: Diagnosis not present

## 2021-09-07 DIAGNOSIS — J45909 Unspecified asthma, uncomplicated: Secondary | ICD-10-CM | POA: Diagnosis not present

## 2021-09-07 DIAGNOSIS — I10 Essential (primary) hypertension: Secondary | ICD-10-CM | POA: Diagnosis not present

## 2021-09-07 DIAGNOSIS — J189 Pneumonia, unspecified organism: Secondary | ICD-10-CM | POA: Diagnosis not present

## 2021-09-07 LAB — GLUCOSE, CAPILLARY
Glucose-Capillary: 155 mg/dL — ABNORMAL HIGH (ref 70–99)
Glucose-Capillary: 241 mg/dL — ABNORMAL HIGH (ref 70–99)
Glucose-Capillary: 191 mg/dL — ABNORMAL HIGH (ref 70–99)
Glucose-Capillary: 324 mg/dL — ABNORMAL HIGH (ref 70–99)

## 2021-09-07 LAB — CBC
HCT: 29 % — ABNORMAL LOW (ref 36.0–46.0)
Hemoglobin: 9 g/dL — ABNORMAL LOW (ref 12.0–15.0)
MCH: 31.8 pg (ref 26.0–34.0)
MCHC: 31 g/dL (ref 30.0–36.0)
MCV: 102.5 fL — ABNORMAL HIGH (ref 80.0–100.0)
Platelets: 165 10*3/uL (ref 150–400)
RBC: 2.83 MIL/uL — ABNORMAL LOW (ref 3.87–5.11)
RDW: 14.3 % (ref 11.5–15.5)
WBC: 7.4 10*3/uL (ref 4.0–10.5)
nRBC: 0 % (ref 0.0–0.2)

## 2021-09-07 LAB — BASIC METABOLIC PANEL
Anion gap: 7 (ref 5–15)
BUN: 62 mg/dL — ABNORMAL HIGH (ref 8–23)
CO2: 24 mmol/L (ref 22–32)
Calcium: 8.8 mg/dL — ABNORMAL LOW (ref 8.9–10.3)
Chloride: 106 mmol/L (ref 98–111)
Creatinine, Ser: 2.07 mg/dL — ABNORMAL HIGH (ref 0.44–1.00)
GFR, Estimated: 22 mL/min — ABNORMAL LOW (ref >60)
Glucose, Bld: 152 mg/dL — ABNORMAL HIGH (ref 70–99)
Potassium: 3.9 mmol/L (ref 3.5–5.1)
Sodium: 137 mmol/L (ref 135–145)

## 2021-09-07 LAB — BRAIN NATRIURETIC PEPTIDE: B Natriuretic Peptide: >4951 pg/mL — ABNORMAL HIGH (ref 0.0–100.0)

## 2021-09-07 LAB — PROCALCITONIN: Procalcitonin: 2.72 ng/mL

## 2021-09-07 MED ORDER — ENSURE ENLIVE PO LIQD
237.0000 mL | Freq: Two times a day (BID) | ORAL | Status: DC
  Administered 2021-09-07 – 2021-09-10 (×6): 237 mL via ORAL

## 2021-09-07 MED ORDER — METHYLPREDNISOLONE SODIUM SUCC 40 MG IJ SOLR
40.0000 mg | Freq: Two times a day (BID) | INTRAMUSCULAR | Status: DC
  Administered 2021-09-07 – 2021-09-08 (×3): 40 mg via INTRAVENOUS
  Filled 2021-09-07 (×3): qty 1

## 2021-09-07 MED ORDER — GUAIFENESIN-DM 100-10 MG/5ML PO SYRP
10.0000 mL | ORAL_SOLUTION | Freq: Three times a day (TID) | ORAL | Status: DC
  Administered 2021-09-07 – 2021-09-10 (×10): 10 mL via ORAL
  Filled 2021-09-07 (×10): qty 10

## 2021-09-07 MED ORDER — ATORVASTATIN CALCIUM 10 MG PO TABS
20.0000 mg | ORAL_TABLET | Freq: Every day | ORAL | Status: DC
  Administered 2021-09-08 – 2021-09-09 (×2): 20 mg via ORAL
  Filled 2021-09-07 (×3): qty 2

## 2021-09-07 MED ORDER — DEXTROSE-NACL 5-0.45 % IV SOLN
INTRAVENOUS | Status: AC
Start: 2021-09-07 — End: 2021-09-07

## 2021-09-07 MED ORDER — INSULIN ASPART 100 UNIT/ML IJ SOLN
5.0000 [IU] | Freq: Once | INTRAMUSCULAR | Status: AC
  Administered 2021-09-07: 5 [IU] via SUBCUTANEOUS

## 2021-09-07 MED ORDER — ADULT MULTIVITAMIN W/MINERALS CH
1.0000 | ORAL_TABLET | Freq: Every day | ORAL | Status: DC
  Administered 2021-09-07 – 2021-09-10 (×4): 1 via ORAL
  Filled 2021-09-07 (×4): qty 1

## 2021-09-07 MED ORDER — MEGESTROL ACETATE 400 MG/10ML PO SUSP
400.0000 mg | Freq: Every day | ORAL | Status: DC
  Administered 2021-09-07 – 2021-09-10 (×4): 400 mg via ORAL
  Filled 2021-09-07 (×4): qty 10

## 2021-09-07 MED ORDER — INSULIN ASPART 100 UNIT/ML IJ SOLN
0.0000 [IU] | Freq: Three times a day (TID) | INTRAMUSCULAR | Status: DC
  Administered 2021-09-07: 2 [IU] via SUBCUTANEOUS
  Administered 2021-09-07: 1 [IU] via SUBCUTANEOUS
  Administered 2021-09-08 (×3): 3 [IU] via SUBCUTANEOUS
  Administered 2021-09-09: 4 [IU] via SUBCUTANEOUS
  Administered 2021-09-09 – 2021-09-10 (×3): 3 [IU] via SUBCUTANEOUS

## 2021-09-07 NOTE — Progress Notes (Signed)
Initial Nutrition Assessment ? ?DOCUMENTATION CODES:  ? ?Severe malnutrition in context of chronic illness, Underweight ? ?INTERVENTION:  ?- Continue regular diet, encourage adequate PO intake ?- Ensure Enlive po BID, each supplement provides 350 kcal and 20 grams of protein (pt prefers chocolate and strawberry) ?- MVI with minerals daily ?- Provide 3 snacks daily to enhance nutritional intake ? ?NUTRITION DIAGNOSIS:  ? ?Severe Malnutrition related to chronic illness (CAD, afib, advanced age) as evidenced by energy intake < or equal to 75% for > or equal to 1 month, severe fat depletion, severe muscle depletion. ? ?GOAL:  ? ?Patient will meet greater than or equal to 90% of their needs ? ?MONITOR:  ? ?PO intake, Supplement acceptance, Labs, Weight trends, Skin, I & O's ? ?REASON FOR ASSESSMENT:  ? ?Consult ?Assessment of nutrition requirement/status, Diet education ? ?ASSESSMENT:  ? ?Pt admitted from home with cough and mild dyspnea secondary to CAP. PMH includes HTN, CKD, CAD, CHF with EF of 40%, diabetes, afib on Eliquis. ? ?Pt's daughter at bedside, assisted in providing nutrition history. States that she has had poor PO intake for several months. Breakfast she may have eggs, bacon and toast. Lunch may consist of a pimento cheese or banana sandwich. She also enjoys strawberry ice cream. They are hopeful with the addition of megace, that this will help stimulate appetite. She is used to eating smaller snacks throughout the day and is agreeable to the addition of 3 snacks during admission to see if this will improve PO intake. At home she generally drinks about 1 Ensure daily. Will order Ensure during admission. ? ?Sporadic meal completions noted.  ?3/3: breakfast-50%, lunch-30%, dinner- 35% ?3/4: breakfast-100%, lunch-0%,  ?3/5: breakfast- 10%, lunch-25%, dinner-25% ? ?Pt is unsure of her usual weight but reports her most recent weight at doctor's visit was 106 lbs. Her daughter states she once weighed >200 lbs.  They report this weight loss has not been rapid and has occurred over time. Per review of chart, noted a 5% weight loss noted within the last 3 months.  ? ?Medications: azithromycin, benxonatate, Vitamin D3, SSI, synthroid, megace, IV abx ? ?Labs: BUN 62, Cr 2.07, Ca 8.8, CBG's 155-241 x 12 hours ? ?NUTRITION - FOCUSED PHYSICAL EXAM: ? ?Flowsheet Row Most Recent Value  ?Orbital Region Moderate depletion  ?Upper Arm Region Severe depletion  ?Thoracic and Lumbar Region Severe depletion  ?Buccal Region Moderate depletion  ?Temple Region Moderate depletion  ?Clavicle Bone Region Severe depletion  ?Clavicle and Acromion Bone Region Severe depletion  ?Scapular Bone Region Severe depletion  ?Dorsal Hand Severe depletion  ?Patellar Region Severe depletion  ?Anterior Thigh Region Severe depletion  ?Posterior Calf Region Severe depletion  ?Edema (RD Assessment) None  ?Hair Reviewed  ?Eyes Reviewed  ?Mouth Reviewed  ?Skin Reviewed  ?Nails Reviewed  ? ?  ? ? ?Diet Order:   ?Diet Order   ? ?       ?  Diet regular Room service appropriate? Yes; Fluid consistency: Thin  Diet effective now       ?  ? ?  ?  ? ?  ? ? ?EDUCATION NEEDS:  ? ?Education needs have been addressed ? ?Skin:  Skin Assessment: Reviewed RN Assessment ? ?Last BM:  3/4 ? ?Height:  ? ?Ht Readings from Last 1 Encounters:  ?09/03/21 '5\' 7"'$  (1.702 m)  ? ? ?Weight:  ? ?Wt Readings from Last 1 Encounters:  ?09/03/21 49.4 kg  ? ? ?BMI:  Body mass index is 17.06 kg/m?Marland Kitchen ? ?  Estimated Nutritional Needs:  ? ?Kcal:  1400-1700 ? ?Protein:  70-85g ? ?Fluid:  >/=1.5L ? ?Clayborne Dana, RDN, LDN ?Clinical Nutrition ?

## 2021-09-07 NOTE — Progress Notes (Signed)
Patient noted to have oxygen off, replaced and Sats at 97. Able to arouse but would fall back asleep, too lethargic for po meds at this time. Patient had this same episode at beginning of shift and was slightly confused/lethargic. Call daughter stating she did not have any oxygen but was more alert and coherent shortly after. Will continue to monitor.  ?

## 2021-09-07 NOTE — Progress Notes (Signed)
?   09/07/21 1000  ?Assess: MEWS Score  ?Temp 97.7 ?F (36.5 ?C)  ?BP 92/63  ?Pulse Rate (!) 112  ?Resp 20  ?Level of Consciousness Alert  ?SpO2 100 %  ?O2 Device Nasal Cannula  ?O2 Flow Rate (L/min) 2 L/min  ?Assess: MEWS Score  ?MEWS Temp 0  ?MEWS Systolic 1  ?MEWS Pulse 2  ?MEWS RR 0  ?MEWS LOC 0  ?MEWS Score 3  ?MEWS Score Color Yellow  ?Assess: if the MEWS score is Yellow or Red  ?Were vital signs taken at a resting state? Yes  ?Focused Assessment No change from prior assessment  ?MEWS guidelines implemented *See Row Information* Yes ?(history of afib)  ?Treat  ?Pain Scale 0-10  ?Pain Score 6  ?Pain Type Chronic pain  ?Pain Location Leg  ?Pain Orientation Left  ?Pain Intervention(s) Medication (See eMAR)  ?Take Vital Signs  ?Increase Vital Sign Frequency  Yellow: Q 2hr X 2 then Q 4hr X 2, if remains yellow, continue Q 4hrs  ?Escalate  ?MEWS: Escalate Yellow: discuss with charge nurse/RN and consider discussing with provider and RRT  ?Notify: Charge Nurse/RN  ?Name of Charge Nurse/RN Notified Timmie Foerster, RN  ?Date Charge Nurse/RN Notified 09/07/21  ?Time Charge Nurse/RN Notified 1010  ?Notify: Provider  ?Provider Name/Title Shahmehdi MD  ?Date Provider Notified 09/07/21  ?Time Provider Notified 1007  ?Notification Type  ?(sent message to notify of heart rate and blood pressure)  ?Notification Reason Other (Comment) ?(notified of heart rate, rhythm and current blood pressure and asked to advise if okay to give amiodarone)  ?Provider response Other (Comment) ?(responded to message and advised to go ahead with amiodarone)  ?Date of Provider Response 09/07/21  ?Time of Provider Response 1050  ?Document  ?Patient Outcome Stabilized after interventions  ?Progress note created (see row info) Yes  ? ? ?

## 2021-09-07 NOTE — Progress Notes (Signed)
PROGRESS NOTE    Patient: Debra [name]                            PCP: Sharilyn Sites, MD                    DOB: 06-05-1929            DOA: 09/03/2021 [id number]             DOS: 09/07/2021, 10:44 AM   LOS: 3 days   Date of Service: The patient was seen and examined on 09/07/2021  Subjective:   The patient was seen and examined, sitting up in chair, lethargic arousable following some commands Still requiring 3 L of oxygen, coughing, complaining of severe weakness, some shortness of breath especially with exertion Currently satting 100%, tachycardic with heart rate of 112 Otherwise afebrile, WBC normalized, elevated BUN/creatinine  Brief Narrative:    Debra [name]  is a 88 y.o. female, with past medical history of hypertension, CKD, CAD, CHF with EF of 40%, diabetes, A-fib on Eliquis, patient presents to ED secondary to complaints of cough, patient report cough has been going on for the last week, productive with yellow phlegm, she does report some fever and chills, and congestion, she does report some mild dyspnea as well, denies any chest pain, hemoptysis, no coffee-ground emesis, she denies any sick contacts, no dysuria, no polyuria. -In ED patient was dyspneic, initial x-ray with no evidence of pneumonia, but CT confirmed pneumonia, creatinine at 1.65, troponins 184>> 222, she denies any chest pain, no EKG changes, patient was started on IV Rocephin and azithromycin    Assessment & Plan:   Principal Problem:   Dyspnea Active Problems:   CAP (community acquired pneumonia)   Asthma   Hypertension   Chronic renal insufficiency, stage 3 (moderate) (HCC)   Type 2 diabetes mellitus (HCC)   CAD (coronary artery disease)   GERD (gastroesophageal reflux disease)   PNA (pneumonia)     Assessment and Plan:   Community-acquired pneumonia  Afebrile, normotensive, improved leukocytosis Worsening kidney function -Still requiring 3 L of oxygen-but satting 100% Complaining of  cough shortness of breath with exertion  -Obtaining another chest x-ray today 09/07/2021 -Monitoring labs, adding steroids IV   -Chest x-ray 09/05/2021 revealed cardiomegaly no new infiltrate  -She presents with significant cough, congestion, CT chest significant for right flank pneumonia  -Will Continue current antibiotics of azithromycin and Rocephin -Blood/sputum cultures >> no growth to date -Encouraged use incentive spirometer, flutter valve, started on Mucinex -We will need repeat imaging in 4 to 6 weeks to ensure resolution of pneumonia.  -Continue breathing treatment, continue O2 supplement    History of A-fib -Mildly tachycardic heart rate of 112 this morning -Continue with Eliquis for anticoagulation -Continue with amiodarone -Denies any chest pain   Chronic systolic CHF -No evidence of volume overload, resuming home medication Lasix -continue with Entresto -Stable -Chest x-ray reviewed 3/4 -no signs of volume overload Repeating chest x-ray 3 09/07/2021 -Initiating gentle IV fluid -due to dehydration,, avoiding volume overload  History of hypothyroidism -Continue with Synthroid Stable   Elevated troponins -This is likely due to demand ischemia and hypoxia, with baseline CKD  -Troponin 184, 222 -EKG nonacute -Denying of any chest pain only cough   Hypertension -Continue with home medications -Stable   Type 2 diabetes mellitus -Does not appear to be on any home medications, -We will check her CBG  QA CHS with SSI coverage  CKD stage III  -Monitor renal function closely  -Creatinine 1.65 > 1.35 >> 1.49 >> 1.96 >> 2.07  History of COPD /asthma  -With some wheezing, continue with as needed albuterol   --------------------------------------------------------------------------------------------------------------------------------------- Nutritional status:  The patient's BMI is: Body mass index is 17.06 kg/m. I agree with the assessment and plan as outlined    ----------------------------------------------------------------------------------------------------- Cultures; Blood Cultures x 2 >> no growth to date Sputum Culture >> no growth to date  ---------------------------------------------------------------------------------------------------------------------------------  DVT prophylaxis:  apixaban (ELIQUIS) tablet 2.5 mg Start: 09/03/21 2300 apixaban (ELIQUIS) tablet 2.5 mg   Code Status: DNR  Family Communication: -Discussed with patient, and her daughter at bedside -updated 3/6 Confirmed DNR status  The above findings and plan of care has been discussed with patient (and family)  in detail,  they expressed understanding and agreement of above. -Advance care planning has been discussed.   Admission status:   Status: Inpatient-will require inpatient criteria due to increasing O2 demand, cough, IV antibiotics  Disposition: From home anticipating to be discharged home in 1 to 2 days with home health           Procedures:   No admission procedures for hospital encounter.   Antimicrobials:  Anti-infectives (From admission, onward)    Start     Dose/Rate Route Frequency Ordered Stop   09/06/21 1300  azithromycin (ZITHROMAX) tablet 500 mg        500 mg Oral Daily 09/06/21 1208     09/04/21 1600  cefTRIAXone (ROCEPHIN) 2 g in sodium chloride 0.9 % 100 mL IVPB        2 g 200 mL/hr over 30 Minutes Intravenous Every 24 hours 09/03/21 1759     09/03/21 1800  azithromycin (ZITHROMAX) 500 mg in sodium chloride 0.9 % 250 mL IVPB  Status:  Discontinued        500 mg 250 mL/hr over 60 Minutes Intravenous Every 24 hours 09/03/21 1759 09/06/21 1208   09/03/21 1515  cefTRIAXone (ROCEPHIN) 2 g in sodium chloride 0.9 % 100 mL IVPB        2 g 200 mL/hr over 30 Minutes Intravenous  Once 09/03/21 1513 09/03/21 1644        Medication:   amiodarone  100 mg Oral Daily   apixaban  2.5 mg Oral BID   [START ON 09/08/2021] atorvastatin   20 mg Oral q1800   azithromycin  500 mg Oral Daily   benzonatate  200 mg Oral TID   cholecalciferol  5,000 Units Oral Daily   gabapentin  100 mg Oral TID   guaiFENesin-dextromethorphan  5 mL Oral Q8H   insulin aspart  0-6 Units Subcutaneous TID WC   ipratropium  0.5 mg Nebulization BID   levalbuterol  1.25 mg Nebulization BID   levothyroxine  125 mcg Oral Daily   megestrol  400 mg Oral Daily   methylPREDNISolone (SOLU-MEDROL) injection  40 mg Intravenous Q12H   [START ON 09/08/2021] sacubitril-valsartan  1 tablet Oral BID   traZODone  50 mg Oral QHS    acetaminophen **OR** acetaminophen, albuterol, chlorpheniramine-HYDROcodone, diazepam, methocarbamol (ROBAXIN) IV, ondansetron **OR** ondansetron (ZOFRAN) IV, oxyCODONE   Objective:   Vitals:   09/06/21 2339 09/07/21 0508 09/07/21 0825 09/07/21 1000  BP:  119/63  92/63  Pulse:  74  (!) 112  Resp:  20  20  Temp:  97.6 F (36.4 C)  97.7 F (36.5 C)  TempSrc:    Oral  SpO2: 96% 100%  98% 100%  Weight:      Height:        Intake/Output Summary (Last 24 hours) at 09/07/2021 1044 Last data filed at 09/07/2021 0900 Gross per 24 hour  Intake 1012.9 ml  Output 500 ml  Net 512.9 ml   Filed Weights   09/03/21 1347  Weight: 49.4 kg     Examination:      General:  Cachectic, awake otherwise lethargic, cooperative    HEENT:  Normocephalic, PERRL, otherwise with in Normal limits   Neuro:  CNII-XII intact. , normal motor and sensation, reflexes intact   Lungs:   Clear to auscultation BL, Respirations unlabored, no wheezes / crackles  Cardio:    S1/S2, RRR, No murmure, No Rubs or Gallops   Abdomen:   Soft, non-tender, bowel sounds active all four quadrants,  no guarding or peritoneal signs.  Muscular skeletal:  Severe global generalized emesis, with cachexia, generalized muscle wasting  Limited exam - in bed, able to move all 4 extremities,   2+ pulses,  symmetric, No pitting edema   Skin:  Dry, warm to touch, negative for any  Rashes,  Wounds: Please see nursing documentation          LABs:  CBC Latest Ref Rng & Units 09/07/2021 09/06/2021 09/05/2021  WBC 4.0 - 10.5 K/uL 7.4 12.7(H) 11.0(H)  Hemoglobin 12.0 - 15.0 g/dL 9.0(L) 9.4(L) 10.1(L)  Hematocrit 36.0 - 46.0 % 29.0(L) 29.7(L) 32.2(L)  Platelets 150 - 400 K/uL 165 173 171   CMP Latest Ref Rng & Units 09/07/2021 09/06/2021 09/05/2021  Glucose 70 - 99 mg/dL 152(H) 177(H) 161(H)  BUN 8 - 23 mg/dL 62(H) 48(H) 37(H)  Creatinine 0.44 - 1.00 mg/dL 2.07(H) 1.96(H) 1.49(H)  Sodium 135 - 145 mmol/L 137 140 138  Potassium 3.5 - 5.1 mmol/L 3.9 4.4 4.1  Chloride 98 - 111 mmol/L 106 107 106  CO2 22 - 32 mmol/L '24 25 24  '$ Calcium 8.9 - 10.3 mg/dL 8.8(L) 8.9 9.0  Total Protein 6.5 - 8.1 g/dL - - -  Total Bilirubin 0.3 - 1.2 mg/dL - - -  Alkaline Phos 38 - 126 U/L - - -  AST 15 - 41 U/L - - -  ALT 0 - 44 U/L - - -       Micro Results Recent Results (from the past 240 hour(s))  Blood culture (routine x 2)     Status: None (Preliminary result)   Collection Time: 09/03/21  1:55 PM   Specimen: Right Antecubital; Blood  Result Value Ref Range Status   Specimen Description RIGHT ANTECUBITAL  Final   Special Requests   Final    BOTTLES DRAWN AEROBIC AND ANAEROBIC Blood Culture adequate volume   Culture   Final    NO GROWTH 4 DAYS Performed at [facility], [address] [place], [place] [address]    Report Status PENDING  Incomplete  Resp Panel by RT-PCR (Flu A&B, Covid) Nasopharyngeal Swab     Status: None   Collection Time: 09/03/21  1:55 PM   Specimen: Nasopharyngeal Swab; Nasopharyngeal(NP) swabs in vial transport medium  Result Value Ref Range Status   SARS Coronavirus 2 by RT PCR NEGATIVE NEGATIVE Final    Comment: (NOTE) SARS-CoV-2 target nucleic acids are NOT DETECTED.  The SARS-CoV-2 RNA is generally detectable in upper respiratory specimens during the acute phase of infection. The lowest concentration of SARS-CoV-2 viral copies this assay can detect  is 138 copies/mL. A negative result does not preclude SARS-Cov-2 infection  and should not be used as the sole basis for treatment or other patient management decisions. A negative result may occur with  improper specimen collection/handling, submission of specimen other than nasopharyngeal swab, presence of viral mutation(s) within the areas targeted by this assay, and inadequate number of viral copies(<138 copies/mL). A negative result must be combined with clinical observations, patient history, and epidemiological information. The expected result is Negative.  Fact Sheet for Patients:  EntrepreneurPulse.com.au  Fact Sheet for Healthcare Providers:  IncredibleEmployment.be  This test is no t yet approved or cleared by the [place] FDA and  has been authorized for detection and/or diagnosis of SARS-CoV-2 by FDA under an Emergency Use Authorization (EUA). This EUA will remain  in effect (meaning this test can be used) for the duration of the COVID-19 declaration under Section 564(b)(1) of the Act, 21 U.S.C.section 360bbb-3(b)(1), unless the authorization is terminated  or revoked sooner.       Influenza A by PCR NEGATIVE NEGATIVE Final   Influenza B by PCR NEGATIVE NEGATIVE Final    Comment: (NOTE) The Xpert Xpress SARS-CoV-2/FLU/RSV plus assay is intended as an aid in the diagnosis of influenza from Nasopharyngeal swab specimens and should not be used as a sole basis for treatment. Nasal washings and aspirates are unacceptable for Xpert Xpress SARS-CoV-2/FLU/RSV testing.  Fact Sheet for Patients: EntrepreneurPulse.com.au  Fact Sheet for Healthcare Providers: IncredibleEmployment.be  This test is not yet approved or cleared by the [place] FDA and has been authorized for detection and/or diagnosis of SARS-CoV-2 by FDA under an Emergency Use Authorization (EUA). This EUA will remain in effect  (meaning this test can be used) for the duration of the COVID-19 declaration under Section 564(b)(1) of the Act, 21 U.S.C. section 360bbb-3(b)(1), unless the authorization is terminated or revoked.  Performed at [facility], [address] [place], [place] [address]   Blood culture (routine x 2)     Status: None (Preliminary result)   Collection Time: 09/03/21  2:00 PM   Specimen: Left Antecubital; Blood  Result Value Ref Range Status   Specimen Description LEFT ANTECUBITAL  Final   Special Requests   Final    BOTTLES DRAWN AEROBIC AND ANAEROBIC Blood Culture adequate volume   Culture   Final    NO GROWTH 4 DAYS Performed at [facility], [address] [place], [place] [address]    Report Status PENDING  Incomplete    Radiology Reports No results found.  SIGNED: Deatra James, MD, FHM. [facility],  Pager (please use [email or web address] to page/text) Please use Epic Secure Chat for non-urgent communication (7AM-7PM)  If 7PM-7AM, please contact night-coverage [email or web address], 09/07/2021, 10:44 AM

## 2021-09-08 DIAGNOSIS — J189 Pneumonia, unspecified organism: Secondary | ICD-10-CM | POA: Diagnosis not present

## 2021-09-08 DIAGNOSIS — I10 Essential (primary) hypertension: Secondary | ICD-10-CM | POA: Diagnosis not present

## 2021-09-08 DIAGNOSIS — R0600 Dyspnea, unspecified: Secondary | ICD-10-CM | POA: Diagnosis not present

## 2021-09-08 DIAGNOSIS — E1169 Type 2 diabetes mellitus with other specified complication: Secondary | ICD-10-CM | POA: Diagnosis not present

## 2021-09-08 LAB — CBC
HCT: 30.5 % — ABNORMAL LOW (ref 36.0–46.0)
Hemoglobin: 9.7 g/dL — ABNORMAL LOW (ref 12.0–15.0)
MCH: 33.2 pg (ref 26.0–34.0)
MCHC: 31.8 g/dL (ref 30.0–36.0)
MCV: 104.5 fL — ABNORMAL HIGH (ref 80.0–100.0)
Platelets: 199 10*3/uL (ref 150–400)
RBC: 2.92 MIL/uL — ABNORMAL LOW (ref 3.87–5.11)
RDW: 14.1 % (ref 11.5–15.5)
WBC: 8.5 10*3/uL (ref 4.0–10.5)
nRBC: 0 % (ref 0.0–0.2)

## 2021-09-08 LAB — CULTURE, BLOOD (ROUTINE X 2)
Culture: NO GROWTH
Culture: NO GROWTH
Special Requests: ADEQUATE
Special Requests: ADEQUATE

## 2021-09-08 LAB — LEGIONELLA PNEUMOPHILA SEROGP 1 UR AG: L. pneumophila Serogp 1 Ur Ag: NEGATIVE

## 2021-09-08 LAB — BASIC METABOLIC PANEL
Anion gap: 10 (ref 5–15)
BUN: 67 mg/dL — ABNORMAL HIGH (ref 8–23)
CO2: 19 mmol/L — ABNORMAL LOW (ref 22–32)
Calcium: 9.4 mg/dL (ref 8.9–10.3)
Chloride: 106 mmol/L (ref 98–111)
Creatinine, Ser: 2.01 mg/dL — ABNORMAL HIGH (ref 0.44–1.00)
GFR, Estimated: 23 mL/min — ABNORMAL LOW (ref >60)
Glucose, Bld: 280 mg/dL — ABNORMAL HIGH (ref 70–99)
Potassium: 4.5 mmol/L (ref 3.5–5.1)
Sodium: 135 mmol/L (ref 135–145)

## 2021-09-08 LAB — GLUCOSE, CAPILLARY
Glucose-Capillary: 268 mg/dL — ABNORMAL HIGH (ref 70–99)
Glucose-Capillary: 269 mg/dL — ABNORMAL HIGH (ref 70–99)
Glucose-Capillary: 293 mg/dL — ABNORMAL HIGH (ref 70–99)
Glucose-Capillary: 250 mg/dL — ABNORMAL HIGH (ref 70–99)

## 2021-09-08 MED ORDER — SODIUM CHLORIDE 0.9 % IV SOLN
2.0000 g | Freq: Once | INTRAVENOUS | Status: AC
  Administered 2021-09-08: 2 g via INTRAVENOUS
  Filled 2021-09-08: qty 20

## 2021-09-08 MED ORDER — METHYLPREDNISOLONE SODIUM SUCC 40 MG IJ SOLR
40.0000 mg | INTRAMUSCULAR | Status: DC
  Administered 2021-09-09: 40 mg via INTRAVENOUS
  Filled 2021-09-08: qty 1

## 2021-09-08 MED ORDER — AMIODARONE HCL 200 MG PO TABS
100.0000 mg | ORAL_TABLET | Freq: Once | ORAL | Status: AC
  Administered 2021-09-08: 100 mg via ORAL
  Filled 2021-09-08: qty 1

## 2021-09-08 MED ORDER — LEVOFLOXACIN 500 MG PO TABS: 500.0000 mg | ORAL_TABLET | Freq: Every day | ORAL | Status: DC

## 2021-09-08 MED ORDER — DOXYCYCLINE HYCLATE 100 MG PO TABS
100.0000 mg | ORAL_TABLET | Freq: Two times a day (BID) | ORAL | Status: DC
  Administered 2021-09-09 – 2021-09-10 (×3): 100 mg via ORAL
  Filled 2021-09-08 (×3): qty 1

## 2021-09-08 NOTE — Progress Notes (Signed)
?   09/08/21 2230  ?Vitals  ?Temp 98.5 ?F (36.9 ?C)  ?Temp Source Oral  ?BP 111/71  ?MAP (mmHg) 77  ?BP Location Left Arm  ?BP Method Automatic  ?Patient Position (if appropriate) Lying  ?Pulse Rate (!) 122  ?Resp 16  ?MEWS COLOR  ?MEWS Score Color Yellow  ?Oxygen Therapy  ?SpO2 97 %  ?O2 Device Room Air  ?MEWS Score  ?MEWS Temp 0  ?MEWS Systolic 0  ?MEWS Pulse 2  ?MEWS RR 0  ?MEWS LOC 0  ?MEWS Score 2  ?Provider Notification  ?Provider Name/Title Zierle-Ghosh  ?Date Provider Notified 09/08/21  ?Time Provider Notified 2103  ?Notification Type Page  ?Notification Reason Other (Comment) ?(Pt. HR 120s-130s)  ? ? ?

## 2021-09-08 NOTE — Progress Notes (Signed)
Patient's HR in the 120's to 130's afib. HR 126 on ECG monitor at this time. New order placed for Amiodarone '100mg'$  PO once given.  ?

## 2021-09-08 NOTE — Progress Notes (Signed)
PROGRESS NOTE    Patient: Debra [name]                            PCP: Sharilyn Sites, MD                    DOB: 11-17-1928            DOA: 09/03/2021 IFO:[id number]             DOS: 09/08/2021, 12:47 PM   LOS: 4 days   Date of Service: The patient was seen and examined on 09/08/2021  Subjective:   The patient was seen and examined this morning, much more awake alert, following command, less lethargic than yesterday. Still on 3 L of oxygen this morning satting 96%  Brief Narrative:    Caycee Wanat  is a 88 y.o. female, with past medical history of hypertension, CKD, CAD, CHF with EF of 40%, diabetes, A-fib on Eliquis, patient presents to ED secondary to complaints of cough, patient report cough has been going on for the last week, productive with yellow phlegm, she does report some fever and chills, and congestion, she does report some mild dyspnea as well, denies any chest pain, hemoptysis, no coffee-ground emesis, she denies any sick contacts, no dysuria, no polyuria. -In ED patient was dyspneic, initial x-ray with no evidence of pneumonia, but CT confirmed pneumonia, creatinine at 1.65, troponins 184>> 222, she denies any chest pain, no EKG changes, patient was started on IV Rocephin and azithromycin    Assessment & Plan:   Principal Problem:   Dyspnea Active Problems:   CAP (community acquired pneumonia)   Asthma   Hypertension   Chronic renal insufficiency, stage 3 (moderate) (HCC)   Type 2 diabetes mellitus (HCC)   CAD (coronary artery disease)   GERD (gastroesophageal reflux disease)   PNA (pneumonia)     Assessment and Plan:   Community-acquired pneumonia  Much improved, afebrile, normotensive  -Still requiring 3 L of oxygen-but satting 100% >> planning to wean off oxygen She has been complaining of cough and shortness of breath Which has somewhat improved  -Obtaining another chest x-ray today 09/07/2021 -Monitoring labs, initiated IV steroids 09/07/2021>> with the  plan of quick taper  -Chest x-ray 09/05/2021 revealed cardiomegaly no new infiltrate -CT chest significant for right flank pneumonia  -has been azithromycin and Rocephin >> will switch to p.o. Levaquin 09/08/2021  -Blood/sputum cultures >> no growth to date -Encouraged use incentive spirometer, flutter valve, started on Mucinex -We will need repeat imaging in 4 to 6 weeks to ensure resolution of pneumonia.  -Continue breathing treatment, continue O2 supplement    History of A-fib -Mildly tachycardic heart rate of 112 this morning -Continue with Eliquis for anticoagulation -Continue with amiodarone -Remained stable denies any chest pain   Chronic systolic CHF -No evidence of volume overload, resuming home medication Lasix -continue with Entresto -Stable with no signs of volume overload we will discontinue IV fluids -Chest x-ray reviewed 3/4 -no signs of volume overload Repeating chest x-ray 3 09/07/2021 -Initiating gentle IV fluid -due to dehydration,, avoiding volume overload  History of hypothyroidism -Continue with Synthroid Remained stable   Elevated troponins -This is likely due to demand ischemia and hypoxia, with baseline CKD  -Troponin 184, 222 -EKG nonacute -Denies any chest pain   Hypertension -Continue with home medications -Stable   Type 2 diabetes mellitus -Does not appear to be on any home medications, -  We will check her CBG QA CHS with SSI coverage Dissipating mild hyperglycemia due to IV steroids  CKD stage III  -Monitor renal function closely  -Creatinine 1.65 > 1.35 >> 1.49 >> 1.96 >> 2.07 >> 2.01 -Avoiding nephrotoxic  History of COPD /asthma  -With some wheezing, continue with as needed albuterol   --------------------------------------------------------------------------------------------------------------------------------------- Nutritional status:  The patient's BMI is: Body mass index is 17.06 kg/m. I agree with the assessment and plan as  outlined   ----------------------------------------------------------------------------------------------------- Cultures; Blood Cultures x 2 >> no growth to date Sputum Culture >> no growth to date  ---------------------------------------------------------------------------------------------------------------------------------  DVT prophylaxis:  apixaban (ELIQUIS) tablet 2.5 mg Start: 09/03/21 2300 apixaban (ELIQUIS) tablet 2.5 mg   Code Status: DNR  Family Communication: - Discussed with patient, and her daughter at bedside -updated 3/6 Confirmed DNR status  The above findings and plan of care has been discussed with patient (and family)  in detail,  they expressed understanding and agreement of above. -Advance care planning has been discussed.   Admission status:   Status: Inpatient-will require inpatient criteria due to increasing O2 demand, cough, IV antibiotics  Disposition: From home - - Anticipating to be discharged home in 1 to 2 days with home health           Procedures:   No admission procedures for hospital encounter.   Antimicrobials:  Anti-infectives (From admission, onward)    Start     Dose/Rate Route Frequency Ordered Stop   09/06/21 1300  azithromycin (ZITHROMAX) tablet 500 mg        500 mg Oral Daily 09/06/21 1208     09/04/21 1600  cefTRIAXone (ROCEPHIN) 2 g in sodium chloride 0.9 % 100 mL IVPB        2 g 200 mL/hr over 30 Minutes Intravenous Every 24 hours 09/03/21 1759     09/03/21 1800  azithromycin (ZITHROMAX) 500 mg in sodium chloride 0.9 % 250 mL IVPB  Status:  Discontinued        500 mg 250 mL/hr over 60 Minutes Intravenous Every 24 hours 09/03/21 1759 09/06/21 1208   09/03/21 1515  cefTRIAXone (ROCEPHIN) 2 g in sodium chloride 0.9 % 100 mL IVPB        2 g 200 mL/hr over 30 Minutes Intravenous  Once 09/03/21 1513 09/03/21 1644        Medication:   amiodarone  100 mg Oral Daily   apixaban  2.5 mg Oral BID   atorvastatin  20  mg Oral q1800   azithromycin  500 mg Oral Daily   benzonatate  200 mg Oral TID   cholecalciferol  5,000 Units Oral Daily   feeding supplement  237 mL Oral BID BM   gabapentin  100 mg Oral TID   guaiFENesin-dextromethorphan  10 mL Oral Q8H   insulin aspart  0-6 Units Subcutaneous TID WC   ipratropium  0.5 mg Nebulization BID   levalbuterol  1.25 mg Nebulization BID   levothyroxine  125 mcg Oral Daily   megestrol  400 mg Oral Daily   methylPREDNISolone (SOLU-MEDROL) injection  40 mg Intravenous Q12H   multivitamin with minerals  1 tablet Oral Daily   sacubitril-valsartan  1 tablet Oral BID   traZODone  50 mg Oral QHS    acetaminophen **OR** acetaminophen, albuterol, chlorpheniramine-HYDROcodone, diazepam, methocarbamol (ROBAXIN) IV, ondansetron **OR** ondansetron (ZOFRAN) IV, oxyCODONE   Objective:   Vitals:   09/08/21 0242 09/08/21 0849 09/08/21 1016 09/08/21 1227  BP: 106/80   102/69  Pulse:  91  83 (!) 110  Resp: 15   20  Temp: 98.4 F (36.9 C)   98.6 F (37 C)  TempSrc:      SpO2: 98% 96% 100% 97%  Weight:      Height:        Intake/Output Summary (Last 24 hours) at 09/08/2021 1247 Last data filed at 09/08/2021 1100 Gross per 24 hour  Intake 1072.99 ml  Output 900 ml  Net 172.99 ml   Filed Weights   09/03/21 1347  Weight: 49.4 kg     Examination:      Physical Exam:   General:  Much more awake alert this a.m., less lethargic Still on 3 L of oxygen this morning  HEENT:  Normocephalic, PERRL, otherwise with in Normal limits   Neuro:  CNII-XII intact. , normal motor and sensation, reflexes intact   Lungs:   Clear to auscultation BL, Respirations unlabored,  No wheezes / crackles  Cardio:    S1/S2, RRR, No murmure, No Rubs or Gallops   Abdomen:  Soft, non-tender, bowel sounds active all four quadrants, no guarding or peritoneal signs.  Muscular  skeletal:  Limited exam -global generalized weaknesses - in bed, able to move all 4 extremities,   2+ pulses,   symmetric, No pitting edema  Skin:  Dry, warm to touch, negative for any Rashes,  Wounds: Please see nursing documentation           LABs:  CBC Latest Ref Rng & Units 09/08/2021 09/07/2021 09/06/2021  WBC 4.0 - 10.5 K/uL 8.5 7.4 12.7(H)  Hemoglobin 12.0 - 15.0 g/dL 9.7(L) 9.0(L) 9.4(L)  Hematocrit 36.0 - 46.0 % 30.5(L) 29.0(L) 29.7(L)  Platelets 150 - 400 K/uL 199 165 173   CMP Latest Ref Rng & Units 09/08/2021 09/07/2021 09/06/2021  Glucose 70 - 99 mg/dL 280(H) 152(H) 177(H)  BUN 8 - 23 mg/dL 67(H) 62(H) 48(H)  Creatinine 0.44 - 1.00 mg/dL 2.01(H) 2.07(H) 1.96(H)  Sodium 135 - 145 mmol/L 135 137 140  Potassium 3.5 - 5.1 mmol/L 4.5 3.9 4.4  Chloride 98 - 111 mmol/L 106 106 107  CO2 22 - 32 mmol/L 19(L) 24 25  Calcium 8.9 - 10.3 mg/dL 9.4 8.8(L) 8.9  Total Protein 6.5 - 8.1 g/dL - - -  Total Bilirubin 0.3 - 1.2 mg/dL - - -  Alkaline Phos 38 - 126 U/L - - -  AST 15 - 41 U/L - - -  ALT 0 - 44 U/L - - -       Micro Results Recent Results (from the past 240 hour(s))  Blood culture (routine x 2)     Status: None   Collection Time: 09/03/21  1:55 PM   Specimen: Right Antecubital; Blood  Result Value Ref Range Status   Specimen Description RIGHT ANTECUBITAL  Final   Special Requests   Final    BOTTLES DRAWN AEROBIC AND ANAEROBIC Blood Culture adequate volume   Culture   Final    NO GROWTH 5 DAYS Performed at [facility], [address] [place], [place] [address]    Report Status 09/08/2021 FINAL  Final  Resp Panel by RT-PCR (Flu A&B, Covid) Nasopharyngeal Swab     Status: None   Collection Time: 09/03/21  1:55 PM   Specimen: Nasopharyngeal Swab; Nasopharyngeal(NP) swabs in vial transport medium  Result Value Ref Range Status   SARS Coronavirus 2 by RT PCR NEGATIVE NEGATIVE Final    Comment: (NOTE) SARS-CoV-2 target nucleic acids are NOT DETECTED.  The  SARS-CoV-2 RNA is generally detectable in upper respiratory specimens during the acute phase of infection. The  lowest concentration of SARS-CoV-2 viral copies this assay can detect is 138 copies/mL. A negative result does not preclude SARS-Cov-2 infection and should not be used as the sole basis for treatment or other patient management decisions. A negative result may occur with  improper specimen collection/handling, submission of specimen other than nasopharyngeal swab, presence of viral mutation(s) within the areas targeted by this assay, and inadequate number of viral copies(<138 copies/mL). A negative result must be combined with clinical observations, patient history, and epidemiological information. The expected result is Negative.  Fact Sheet for Patients:  EntrepreneurPulse.com.au  Fact Sheet for Healthcare Providers:  IncredibleEmployment.be  This test is no t yet approved or cleared by the [place] FDA and  has been authorized for detection and/or diagnosis of SARS-CoV-2 by FDA under an Emergency Use Authorization (EUA). This EUA will remain  in effect (meaning this test can be used) for the duration of the COVID-19 declaration under Section 564(b)(1) of the Act, 21 U.S.C.section 360bbb-3(b)(1), unless the authorization is terminated  or revoked sooner.       Influenza A by PCR NEGATIVE NEGATIVE Final   Influenza B by PCR NEGATIVE NEGATIVE Final    Comment: (NOTE) The Xpert Xpress SARS-CoV-2/FLU/RSV plus assay is intended as an aid in the diagnosis of influenza from Nasopharyngeal swab specimens and should not be used as a sole basis for treatment. Nasal washings and aspirates are unacceptable for Xpert Xpress SARS-CoV-2/FLU/RSV testing.  Fact Sheet for Patients: EntrepreneurPulse.com.au  Fact Sheet for Healthcare Providers: IncredibleEmployment.be  This test is not yet approved or cleared by the [place] FDA and has been authorized for detection and/or diagnosis of SARS-CoV-2 by FDA under  an Emergency Use Authorization (EUA). This EUA will remain in effect (meaning this test can be used) for the duration of the COVID-19 declaration under Section 564(b)(1) of the Act, 21 U.S.C. section 360bbb-3(b)(1), unless the authorization is terminated or revoked.  Performed at [facility], [address] [place], [place] [address]   Blood culture (routine x 2)     Status: None   Collection Time: 09/03/21  2:00 PM   Specimen: Left Antecubital; Blood  Result Value Ref Range Status   Specimen Description LEFT ANTECUBITAL  Final   Special Requests   Final    BOTTLES DRAWN AEROBIC AND ANAEROBIC Blood Culture adequate volume   Culture   Final    NO GROWTH 5 DAYS Performed at [facility], [address], [place], [place] [address]    Report Status 09/08/2021 FINAL  Final    Radiology Reports No results found.  SIGNED: Deatra James, MD, FHM. [facility],  Pager (please use [email or web address] to page/text) Please use Epic Secure Chat for non-urgent communication (7AM-7PM)  If 7PM-7AM, please contact night-coverage [email or web address], 09/08/2021, 12:47 PM

## 2021-09-09 DIAGNOSIS — R0600 Dyspnea, unspecified: Secondary | ICD-10-CM | POA: Diagnosis not present

## 2021-09-09 DIAGNOSIS — N179 Acute kidney failure, unspecified: Secondary | ICD-10-CM

## 2021-09-09 DIAGNOSIS — J9601 Acute respiratory failure with hypoxia: Secondary | ICD-10-CM

## 2021-09-09 DIAGNOSIS — Z7189 Other specified counseling: Secondary | ICD-10-CM

## 2021-09-09 DIAGNOSIS — I129 Hypertensive chronic kidney disease with stage 1 through stage 4 chronic kidney disease, or unspecified chronic kidney disease: Secondary | ICD-10-CM

## 2021-09-09 LAB — CBC
HCT: 29.2 % — ABNORMAL LOW (ref 36.0–46.0)
Hemoglobin: 9.5 g/dL — ABNORMAL LOW (ref 12.0–15.0)
MCH: 33.5 pg (ref 26.0–34.0)
MCHC: 32.5 g/dL (ref 30.0–36.0)
MCV: 102.8 fL — ABNORMAL HIGH (ref 80.0–100.0)
Platelets: 257 10*3/uL (ref 150–400)
RBC: 2.84 MIL/uL — ABNORMAL LOW (ref 3.87–5.11)
RDW: 14.3 % (ref 11.5–15.5)
WBC: 12.2 10*3/uL — ABNORMAL HIGH (ref 4.0–10.5)
nRBC: 0 % (ref 0.0–0.2)

## 2021-09-09 LAB — BASIC METABOLIC PANEL
Anion gap: 9 (ref 5–15)
BUN: 88 mg/dL — ABNORMAL HIGH (ref 8–23)
CO2: 22 mmol/L (ref 22–32)
Calcium: 9.7 mg/dL (ref 8.9–10.3)
Chloride: 104 mmol/L (ref 98–111)
Creatinine, Ser: 2.23 mg/dL — ABNORMAL HIGH (ref 0.44–1.00)
GFR, Estimated: 20 mL/min — ABNORMAL LOW (ref >60)
Glucose, Bld: 327 mg/dL — ABNORMAL HIGH (ref 70–99)
Potassium: 4.8 mmol/L (ref 3.5–5.1)
Sodium: 135 mmol/L (ref 135–145)

## 2021-09-09 LAB — GLUCOSE, CAPILLARY
Glucose-Capillary: 268 mg/dL — ABNORMAL HIGH (ref 70–99)
Glucose-Capillary: 253 mg/dL — ABNORMAL HIGH (ref 70–99)
Glucose-Capillary: 304 mg/dL — ABNORMAL HIGH (ref 70–99)
Glucose-Capillary: 235 mg/dL — ABNORMAL HIGH (ref 70–99)

## 2021-09-09 MED ORDER — PREDNISONE 20 MG PO TABS
20.0000 mg | ORAL_TABLET | Freq: Every day | ORAL | Status: DC
  Administered 2021-09-10: 08:00:00 20 mg via ORAL
  Filled 2021-09-09: qty 1

## 2021-09-09 MED ORDER — LACTATED RINGERS IV SOLN
INTRAVENOUS | Status: AC
Start: 2021-09-09 — End: 2021-09-09

## 2021-09-09 MED ORDER — INSULIN GLARGINE-YFGN 100 UNIT/ML ~~LOC~~ SOLN
5.0000 [IU] | Freq: Every day | SUBCUTANEOUS | Status: DC
  Administered 2021-09-09 – 2021-09-10 (×2): 5 [IU] via SUBCUTANEOUS
  Filled 2021-09-09 (×4): qty 0.05

## 2021-09-09 NOTE — Progress Notes (Signed)
PROGRESS NOTE    Debra [name]  GGY:[id number] DOB: 1928-11-05 DOA: 09/03/2021 PCP: Sharilyn Sites, MD   Brief Narrative:   Debra [name]  is a 88 y.o. female, with past medical history of hypertension, CKD, CAD, CHF with EF of 40%, diabetes, A-fib on Eliquis, patient presents to ED secondary to complaints of cough, patient report cough has been going on for the last week, productive with yellow phlegm, she does report some fever and chills, and congestion, she does report some mild dyspnea as well, denies any chest pain, hemoptysis, no coffee-ground emesis, she denies any sick contacts, no dysuria, no polyuria. -In ED patient was dyspneic, initial x-ray with no evidence of pneumonia, but CT confirmed pneumonia, creatinine at 1.65, troponins 184>> 222, she denies any chest pain, no EKG changes, patient was started on IV Rocephin and azithromycin  09/09/21: Patient is on doxycycline for community-acquired pneumonia and was also started on steroids which are being weaned.  She continues to have high blood glucose levels and poor appetite as well as signs of dehydration with elevating AKI.  She will be started on IV fluid today.    Assessment & Plan:   Principal Problem:   Dyspnea Active Problems:   CAP (community acquired pneumonia)   Asthma   Hypertension   Chronic renal insufficiency, stage 3 (moderate) (HCC)   Type 2 diabetes mellitus (HCC)   CAD (coronary artery disease)   GERD (gastroesophageal reflux disease)   PNA (pneumonia)  Assessment and Plan:   Community-acquired pneumonia  Much improved, afebrile, normotensive   -Still requiring 3 L of oxygen-but satting 100% >> planning to wean off oxygen She has been complaining of cough and shortness of breath Which has somewhat improved   -Obtaining another chest x-ray today 09/07/2021 -Monitoring labs, initiated IV steroids 09/07/2021>> with the plan of quick taper   -Chest x-ray 09/05/2021 revealed cardiomegaly no new infiltrate -CT  chest significant for right flank pneumonia   -has been azithromycin and Rocephin >> currently on doxycycline   -Blood/sputum cultures >> no growth to date -Encouraged use incentive spirometer, flutter valve, started on Mucinex -We will need repeat imaging in 4 to 6 weeks to ensure resolution of pneumonia.   -Continue breathing treatment, continue O2 supplement     History of A-fib -Mildly tachycardic heart rate that is ongoing -Continue with Eliquis for anticoagulation -Continue with amiodarone -Remained stable denies any chest pain     Chronic systolic CHF -No evidence of volume overload, resuming home medication Lasix -continue with Entresto -Stable with no signs of volume overload we will discontinue IV fluids -Chest x-ray reviewed 3/4 -no signs of volume overload Repeating chest x-ray 3 09/07/2021 -Initiating gentle IV fluid -due to dehydration,, avoiding volume overload   History of hypothyroidism -Continue with Synthroid Remained stable   Elevated troponins -This is likely due to demand ischemia and hypoxia, with baseline CKD  -Troponin 184, 222 -EKG nonacute -Denies any chest pain   Hypertension -Continue with home medications -Stable   Type 2 diabetes mellitus -Does not appear to be on any home medications, -We will check her CBG QA CHS with SSI coverage -Currently with hyperglycemia due to steroid use -Plan to wean steroids and add Semglee   CKD stage III  -Monitor renal function closely  -Creatinine 1.65 > 1.35 >> 1.49 >> 1.96 >> 2.07 >> 2.01>>2.23 -Avoiding nephrotoxic -Started on gentle IV fluid as oral intake is poor   History of COPD /asthma  -With some wheezing, continue with as  needed albuterol  -Wean steroids due to elevated blood glucose levels    DVT prophylaxis: Eliquis Code Status: DNR Family Communication: Daughter, [name] at bedside 3/8 Disposition Plan:  Status is: Inpatient Remains inpatient appropriate because: Requires IV  medications and fluids   Nutritional Assessment:  The patients BMI is: Body mass index is 17.06 kg/m.Marland Kitchen  Seen by dietician.  I agree with the assessment and plan as outlined below:  Nutrition Status: Nutrition Problem: Severe Malnutrition Etiology: chronic illness (CAD, afib, advanced age) Signs/Symptoms: energy intake < or equal to 75% for > or equal to 1 month, severe fat depletion, severe muscle depletion Interventions: Ensure Enlive (each supplement provides 350kcal and 20 grams of protein), MVI, Snacks  .    Skin Assessment:  I have examined the patients skin and I agree with the wound assessment as performed by the wound care RN as outlined below:     Consultants:  None  Procedures:  See below  Antimicrobials:  Anti-infectives (From admission, onward)    Start     Dose/Rate Route Frequency Ordered Stop   09/09/21 1000  doxycycline (VIBRA-TABS) tablet 100 mg        100 mg Oral Every 12 hours 09/08/21 1703     09/08/21 1800  cefTRIAXone (ROCEPHIN) 2 g in sodium chloride 0.9 % 100 mL IVPB        2 g 200 mL/hr over 30 Minutes Intravenous  Once 09/08/21 1703 09/08/21 1900   09/08/21 1345  levofloxacin (LEVAQUIN) tablet 500 mg  Status:  Discontinued        500 mg Oral Daily 09/08/21 1253 09/08/21 1703   09/06/21 1300  azithromycin (ZITHROMAX) tablet 500 mg  Status:  Discontinued        500 mg Oral Daily 09/06/21 1208 09/08/21 1253   09/04/21 1600  cefTRIAXone (ROCEPHIN) 2 g in sodium chloride 0.9 % 100 mL IVPB  Status:  Discontinued        2 g 200 mL/hr over 30 Minutes Intravenous Every 24 hours 09/03/21 1759 09/08/21 1253   09/03/21 1800  azithromycin (ZITHROMAX) 500 mg in sodium chloride 0.9 % 250 mL IVPB  Status:  Discontinued        500 mg 250 mL/hr over 60 Minutes Intravenous Every 24 hours 09/03/21 1759 09/06/21 1208   09/03/21 1515  cefTRIAXone (ROCEPHIN) 2 g in sodium chloride 0.9 % 100 mL IVPB        2 g 200 mL/hr over 30 Minutes Intravenous  Once  09/03/21 1513 09/03/21 1644       Subjective: Patient seen and evaluated today and appears quite fatigued and has not been eating very well according to daughter at bedside.  Blood glucose levels remain elevated.  Objective: Vitals:   09/09/21 0045 09/09/21 0458 09/09/21 0823 09/09/21 1053  BP: 96/66 97/70  120/72  Pulse: (!) 106 63  (!) 58  Resp: '19 17  19  '$ Temp: 97.8 F (36.6 C) 97.8 F (36.6 C)  97.6 F (36.4 C)  TempSrc:    Oral  SpO2: 98% 100% 96% 98%  Weight:      Height:        Intake/Output Summary (Last 24 hours) at 09/09/2021 1109 Last data filed at 09/09/2021 0400 Gross per 24 hour  Intake 360 ml  Output 200 ml  Net 160 ml   Filed Weights   09/03/21 1347  Weight: 49.4 kg    Examination:  General exam: Appears calm and comfortable, lethargic Respiratory system: Clear to  auscultation. Respiratory effort normal. Cardiovascular system: S1 & S2 heard, RRR.  Gastrointestinal system: Abdomen is soft Central nervous system: Lethargic Extremities: No edema Skin: No significant lesions noted Psychiatry: Flat affect.    Data Reviewed: I have personally reviewed following labs and imaging studies  CBC: Recent Labs  Lab 09/05/21 0601 09/06/21 0431 09/07/21 0547 09/08/21 0606 09/09/21 0530  WBC 11.0* 12.7* 7.4 8.5 12.2*  HGB 10.1* 9.4* 9.0* 9.7* 9.5*  HCT 32.2* 29.7* 29.0* 30.5* 29.2*  MCV 101.6* 102.8* 102.5* 104.5* 102.8*  PLT 171 173 165 199 947   Basic Metabolic Panel: Recent Labs  Lab 09/05/21 0601 09/06/21 0431 09/07/21 0547 09/08/21 0606 09/09/21 0530  NA 138 140 137 135 135  K 4.1 4.4 3.9 4.5 4.8  CL 106 107 106 106 104  CO2 '24 25 24 '$ 19* 22  GLUCOSE 161* 177* 152* 280* 327*  BUN 37* 48* 62* 67* 88*  CREATININE 1.49* 1.96* 2.07* 2.01* 2.23*  CALCIUM 9.0 8.9 8.8* 9.4 9.7   GFR: Estimated Creatinine Clearance: 13.1 mL/min (A) (by C-G formula based on SCr of 2.23 mg/dL (H)). Liver Function Tests: Recent Labs  Lab 09/03/21 1407  AST  21  ALT 14  ALKPHOS 73  BILITOT 0.6  PROT 6.8  ALBUMIN 3.4*   No results for input(s): LIPASE, AMYLASE in the last 168 hours. No results for input(s): AMMONIA in the last 168 hours. Coagulation Profile: No results for input(s): INR, PROTIME in the last 168 hours. Cardiac Enzymes: No results for input(s): CKTOTAL, CKMB, CKMBINDEX, TROPONINI in the last 168 hours. BNP (last 3 results) No results for input(s): PROBNP in the last 8760 hours. HbA1C: No results for input(s): HGBA1C in the last 72 hours. CBG: Recent Labs  Lab 09/08/21 0737 09/08/21 1058 09/08/21 1602 09/08/21 2121 09/09/21 0802  GLUCAP 268* 269* 293* 250* 268*   Lipid Profile: No results for input(s): CHOL, HDL, LDLCALC, TRIG, CHOLHDL, LDLDIRECT in the last 72 hours. Thyroid Function Tests: No results for input(s): TSH, T4TOTAL, FREET4, T3FREE, THYROIDAB in the last 72 hours. Anemia Panel: No results for input(s): VITAMINB12, FOLATE, FERRITIN, TIBC, IRON, RETICCTPCT in the last 72 hours. Sepsis Labs: Recent Labs  Lab 09/03/21 1355 09/03/21 1531 09/07/21 0547  PROCALCITON  --   --  2.72  LATICACIDVEN 1.2 1.3  --     Recent Results (from the past 240 hour(s))  Blood culture (routine x 2)     Status: None   Collection Time: 09/03/21  1:55 PM   Specimen: Right Antecubital; Blood  Result Value Ref Range Status   Specimen Description RIGHT ANTECUBITAL  Final   Special Requests   Final    BOTTLES DRAWN AEROBIC AND ANAEROBIC Blood Culture adequate volume   Culture   Final    NO GROWTH 5 DAYS Performed at [facility], [address] [place], [place] [address]    Report Status 09/08/2021 FINAL  Final  Resp Panel by RT-PCR (Flu A&B, Covid) Nasopharyngeal Swab     Status: None   Collection Time: 09/03/21  1:55 PM   Specimen: Nasopharyngeal Swab; Nasopharyngeal(NP) swabs in vial transport medium  Result Value Ref Range Status   SARS Coronavirus 2 by RT PCR NEGATIVE NEGATIVE Final    Comment:  (NOTE) SARS-CoV-2 target nucleic acids are NOT DETECTED.  The SARS-CoV-2 RNA is generally detectable in upper respiratory specimens during the acute phase of infection. The lowest concentration of SARS-CoV-2 viral copies this assay can detect is 138 copies/mL. A negative result does  not preclude SARS-Cov-2 infection and should not be used as the sole basis for treatment or other patient management decisions. A negative result may occur with  improper specimen collection/handling, submission of specimen other than nasopharyngeal swab, presence of viral mutation(s) within the areas targeted by this assay, and inadequate number of viral copies(<138 copies/mL). A negative result must be combined with clinical observations, patient history, and epidemiological information. The expected result is Negative.  Fact Sheet for Patients:  EntrepreneurPulse.com.au  Fact Sheet for Healthcare Providers:  IncredibleEmployment.be  This test is no t yet approved or cleared by the [place] FDA and  has been authorized for detection and/or diagnosis of SARS-CoV-2 by FDA under an Emergency Use Authorization (EUA). This EUA will remain  in effect (meaning this test can be used) for the duration of the COVID-19 declaration under Section 564(b)(1) of the Act, 21 U.S.C.section 360bbb-3(b)(1), unless the authorization is terminated  or revoked sooner.       Influenza A by PCR NEGATIVE NEGATIVE Final   Influenza B by PCR NEGATIVE NEGATIVE Final    Comment: (NOTE) The Xpert Xpress SARS-CoV-2/FLU/RSV plus assay is intended as an aid in the diagnosis of influenza from Nasopharyngeal swab specimens and should not be used as a sole basis for treatment. Nasal washings and aspirates are unacceptable for Xpert Xpress SARS-CoV-2/FLU/RSV testing.  Fact Sheet for Patients: EntrepreneurPulse.com.au  Fact Sheet for Healthcare  Providers: IncredibleEmployment.be  This test is not yet approved or cleared by the [place] FDA and has been authorized for detection and/or diagnosis of SARS-CoV-2 by FDA under an Emergency Use Authorization (EUA). This EUA will remain in effect (meaning this test can be used) for the duration of the COVID-19 declaration under Section 564(b)(1) of the Act, 21 U.S.C. section 360bbb-3(b)(1), unless the authorization is terminated or revoked.  Performed at [facility], [address], [place], [place] [address]   Blood culture (routine x 2)     Status: None   Collection Time: 09/03/21  2:00 PM   Specimen: Left Antecubital; Blood  Result Value Ref Range Status   Specimen Description LEFT ANTECUBITAL  Final   Special Requests   Final    BOTTLES DRAWN AEROBIC AND ANAEROBIC Blood Culture adequate volume   Culture   Final    NO GROWTH 5 DAYS Performed at [facility], [address] [place], [place] [address]    Report Status 09/08/2021 FINAL  Final         Radiology Studies: DG CHEST PORT 1 VIEW  Result Date: 09/07/2021 CLINICAL DATA:  Shortness of breath and weakness. EXAM: PORTABLE CHEST 1 VIEW COMPARISON:  Chest x-ray dated September 05, 2021. FINDINGS: Stable cardiomediastinal silhouette with mild cardiomegaly. Worsened patchy consolidation in the right lower lobe. Similar postsurgical changes in the right upper lobe. The left lung remains clear. No pneumothorax or large pleural effusion. No acute osseous abnormality. IMPRESSION: 1. Worsened right lower lobe pneumonia. Electronically Signed   By: Titus Dubin M.D.   On: 09/07/2021 12:22        Scheduled Meds:  amiodarone  100 mg Oral Daily   apixaban  2.5 mg Oral BID   atorvastatin  20 mg Oral q1800   benzonatate  200 mg Oral TID   cholecalciferol  5,000 Units Oral Daily   doxycycline  100 mg Oral Q12H   feeding supplement  237 mL Oral BID BM   gabapentin  100 mg Oral TID    guaiFENesin-dextromethorphan  10 mL Oral Q8H  insulin aspart  0-6 Units Subcutaneous TID WC   insulin glargine-yfgn  5 Units Subcutaneous Daily   ipratropium  0.5 mg Nebulization BID   levalbuterol  1.25 mg Nebulization BID   levothyroxine  125 mcg Oral Daily   megestrol  400 mg Oral Daily   multivitamin with minerals  1 tablet Oral Daily   [START ON 09/10/2021] predniSONE  20 mg Oral Q breakfast   traZODone  50 mg Oral QHS   Continuous Infusions:  lactated ringers 75 mL/hr at 09/09/21 0848   methocarbamol (ROBAXIN) IV       LOS: 5 days    Time spent: 35 minutes    Debra [name] Darleen Crocker, DO [facility]  If 7PM-7AM, please contact night-coverage [email or web address] 09/09/2021, 11:09 AM

## 2021-09-09 NOTE — Progress Notes (Signed)
Inpatient Diabetes Program Recommendations ? ?AACE/ADA: New Consensus Statement on Inpatient Glycemic Control  ? ?Target Ranges:  Prepandial:   less than 140 mg/dL ?     Peak postprandial:   less than 180 mg/dL (1-2 hours) ?     Critically ill patients:  140 - 180 mg/dL  ? ? Latest Reference Range & Units 09/09/21 08:02  ?Glucose-Capillary 70 - 99 mg/dL 268 (H)  ? ? Latest Reference Range & Units 09/08/21 07:37 09/08/21 10:58 09/08/21 16:02 09/08/21 21:21  ?Glucose-Capillary 70 - 99 mg/dL 268 (H) 269 (H) 293 (H) 250 (H)  ? ?Review of Glycemic Control ? ?Diabetes history: DM2 ?Outpatient Diabetes medications: None ?Current orders for Inpatient glycemic control: Novolog 0-6 units TID with meals; Solumedrol 40 mg Q24H ? ?Inpatient Diabetes Program Recommendations:   ? ?Insulin: If steroids are continued, may want to consider ordering Semglee 5 units Q24H. ? ?Thanks, ?Barnie Alderman, RN, MSN, CDE ?Diabetes Coordinator ?Inpatient Diabetes Program ?[phone] (Team Pager from 8am to 5pm) ? ? ? ?

## 2021-09-09 NOTE — Consult Note (Addendum)
? ?                                                                                ?Consultation Note ?Date: 09/09/2021  ? ?Patient Name: Debra [name]  ?DOB: Jan 30, 1929  MRN: [id number]  Age / Sex: 88 y.o., female  ?PCP: Sharilyn Sites, MD ?Referring Physician: Heath Lark D, DO ? ?Reason for Consultation:  Goals of care ? ?HPI/Patient Profile: 88 y.o. female  with past medical history of HTN, CKD, CAD, CHF, DM, a-fib with eliquis admitted on 09/03/2021 with cough, fever and dyspnea. Workup revealed pneumonia, elevated troponins, acute kidney injury on chronic kidney disease, malnutrition. Palliative medicine consulted for [place].   ? ?Primary Decision Maker ?NEXT OF KIN- daughter- Debra [name] ? ?Discussion: ?Chart reviewed including labs imaging and progress notes.  I awaited patient then met separately with her daughter Debra [name]. ?Lives at home with her daughter Debra [name]. ?She ambulates minimally in the home.  Debra [name] has not noticed ongoing decline in her functional and nutritional status.  She has lost a great deal of weight today she appears cachectic. ?Debra [name] reports that she has just stopped eating. ?We discussed her current illness and trajectory in the setting of her chronic illnesses.Marland Kitchen ?At this point- although her infectious pathology appears to be resolving-  [place] is not able to get out of the bed due to weakness, she continues to not eat or drink. She has been sleeping more than she is awake.  Her kidney function is worsening. ?Advance care planning and goals of care were reviewed. ?Debra [name] notes that she has discussed with her sister and they do believe that B[place] is declining towards end-of-life.  I tend to agree. ?Discussed the concept of failure to thrive. ?If Norris were to return home and to continue to decline, Debra [name] would not want her readmitted to the hospital.  She would prefer for her to just be kept at home and comfortable even if that meant she progressed through natural end of. ?Hospice philosophy and services  were discussed Debra [name] would like for patient to receive hospice care at home after her discharge. ? ?  ? ?SUMMARY OF RECOMMENDATIONS- ?-Treat what is treatable, maximize medically, then discharge home with hospice   ? ?Code Status/Advance Care Planning: ?DNR ? ? ?Prognosis:   ?< 3 months ? ?Discharge Planning: Home with Hospice ? ?Primary Diagnoses: ?Present on Admission: ? Dyspnea ? Hypertension ? Chronic renal insufficiency, stage 3 (moderate) (HCC) ? Asthma ? CAD (coronary artery disease) ? GERD (gastroesophageal reflux disease) ? PNA (pneumonia) ? ? ?Review of Systems  ?Unable to perform ROS: Acuity of condition  ? ?Physical Exam ?Vitals and nursing note reviewed.  ?Constitutional:   ?   Comments: Frail, cachectic  ?Neurological:  ?   Comments: lethargic  ? ? ?Vital Signs: BP 120/72 (BP Location: Right Arm)   Pulse (!) 120   Temp 97.6 ?F (36.4 ?C) (Oral)   Resp 19   Ht 5' 7"  (1.702 m)   Wt 49.4 kg   SpO2 98%   BMI 17.06 kg/m?  ?Pain Scale: 0-10 ?  ?Pain Score: 0-No pain ? ? ?SpO2: SpO2: 98 % ?O2 Device:SpO2: 98 % ?O2 Flow Rate: .  O2 Flow Rate (L/min): 3 L/min ? ?IO: Intake/output summary:  ?Intake/Output Summary (Last 24 hours) at 09/09/2021 1234 ?Last data filed at 09/09/2021 0848 ?Gross per 24 hour  ?Intake 410 ml  ?Output 200 ml  ?Net 210 ml  ? ? ?LBM: Last BM Date : 09/06/21 ?Baseline Weight: Weight: 49.4 kg ?Most recent weight: Weight: 49.4 kg     ? ? ?Thank you for this consult. Palliative medicine will continue to follow and assist as needed.  ? ? ? ?Signed by: ?Mariana Kaufman, AGNP-C ?Palliative Medicine ? ?  ?Please contact Palliative Medicine Team phone at [phone] for questions and concerns.  ?For individual provider: See Amion ? ? ? ? ? ? ? ? ? ? ? ? ? ? ?

## 2021-09-09 NOTE — TOC Initial Note (Signed)
Transition of Care (TOC) - Initial/Assessment Note  ? ? ?Patient Details  ?Name: Debra [name] ?MRN: [id number] ?Date of Birth: 01-Sep-1928 ? ?Transition of Care (TOC) CM/SW Contact:    ?Reace Breshears D, LCSW ?Phone Number: ?09/09/2021, 2:01 PM ? ?Clinical Narrative:                 ?From home admitted for dyspnea. PT recommends HHPT. [facility] is accepting of patient's insurance.  ? ?Expected Discharge Plan: [place] ?Barriers to Discharge: Continued Medical Work up ? ? ?Patient Goals and CMS Choice ?  ?  ?  ? ?Expected Discharge Plan and Services ?Expected Discharge Plan: [place] ?  ?  ?  ?  ?                ?  ?  ?  ?  ?  ?HH Arranged: PT ?[place] Agency: [place] ?Date HH Agency Contacted: 09/09/21 ?Time [place]: 1400 ?Representative spoke with at [place]: Marjory Lies ? ?Prior Living Arrangements/Services ?  ?  ?  ?       ?  ?  ?  ?  ? ?Activities of Daily Living ?Home Assistive Devices/Equipment: Gilford Rile (specify type), Eyeglasses, Hand-held shower hose, Scales, Shower chair without back ?ADL Screening (condition at time of admission) ?Patient's cognitive ability adequate to safely complete daily activities?: Yes ?Is the patient deaf or have difficulty hearing?: No ?Does the patient have difficulty seeing, even when wearing glasses/contacts?: No ?Does the patient have difficulty concentrating, remembering, or making decisions?: No ?Patient able to express need for assistance with ADLs?: Yes ?Does the patient have difficulty dressing or bathing?: Yes ?Independently performs ADLs?: No ?Communication: Independent ?Dressing (OT): Independent ?Grooming: Independent ?Feeding: Independent ?Bathing: Needs assistance ?Is this a change from baseline?: Pre-admission baseline ?Toileting: Needs assistance ?Is this a change from baseline?: Change from baseline, expected to last <3 days ?In/Out Bed: Needs assistance ?Is this a change from baseline?: Change from baseline,  expected to last <3 days ?Walks in Home: Independent, Independent with device (comment) (rollator walker) ?Does the patient have difficulty walking or climbing stairs?: Yes ?Weakness of Legs: Both ?Weakness of Arms/Hands: Both ? ?Permission Sought/Granted ?  ?  ?   ?   ?   ?   ? ?Emotional Assessment ?  ?  ?  ?  ?  ?  ? ?Admission diagnosis:  Dyspnea [R06.00] ?Elevated troponin [R77.8] ?Acute respiratory failure with hypoxia ([place]) [J96.01] ?PNA (pneumonia) [J18.9] ?Patient Active Problem List  ? Diagnosis Date Noted  ? PNA (pneumonia) 09/04/2021  ? Dyspnea 09/03/2021  ? CAP (community acquired pneumonia) 09/03/2021  ? Excessive cerumen in both ear canals 11/26/2020  ? Presbycusis of both ears 11/26/2020  ? Hypotension   ? Cellulitis 02/22/2019  ? Adjustment disorder with mixed anxiety and depressed mood 12/21/2018  ? Benign essential HTN 02/07/2018  ? Dizziness 02/07/2018  ? Elevated troponin 02/07/2018  ? Acute on chronic systolic CHF (congestive heart failure) ([place]) 12/30/2017  ? Non-ST elevation (NSTEMI) myocardial infarction ([place]) 12/26/2017  ? Palpitations 10/03/2017  ? Allergic rhinitis 09/20/2017  ? Asthma 09/20/2017  ? Carpal tunnel syndrome 09/20/2017  ? Diabetes with neurologic complications ([place]) 23/53/6144  ? Generalized osteoarthritis of hand 09/20/2017  ? GERD (gastroesophageal reflux disease) 09/20/2017  ? Hiatal hernia 09/20/2017  ? Positive ANA (antinuclear antibody) 09/20/2017  ? Restless leg 09/20/2017  ? Sciatica 09/20/2017  ? General weakness 05/07/2017  ? 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  UTI (urinary tract infection) 03/20/2017  ? Hypertension 03/20/2017  ? AKI (acute kidney injury) ([place]) 03/20/2017  ? Chronic renal insufficiency, stage 3 (moderate) ([place]) 03/20/2017  ? Rheumatoid arthritis ([place]) 03/20/2017  ? Type 2 diabetes mellitus ([place]) 03/20/2017  ? COPD (chronic obstructive pulmonary disease) ([place]) 06/03/2016  ? Anxiety 05/09/2012  ? Colon cancer ([place]) 03/01/2011  ? CAD (coronary artery  disease) 07/25/2010  ? Hyperlipidemia, unspecified 07/25/2010  ? Hypothyroidism 07/25/2010  ? PAD (peripheral artery disease) ([place]) 07/25/2010  ? ?PCP:  Sharilyn Sites, MD ?Pharmacy:   ?[facility], [address] ?[place] [address] ?Phone: [phone] Fax: [phone] ? ?[facility] [phone] - [place], [place] [address] [place] ?[address] ?[place] [address] ?Phone: [phone] Fax: [phone] ? ? ? ? ?Social Determinants of Health (SDOH) Interventions ?  ? ?Readmission Risk Interventions ?No flowsheet data found. ? ? ?

## 2021-09-10 ENCOUNTER — Encounter (HOSPITAL_COMMUNITY): Payer: Self-pay

## 2021-09-10 ENCOUNTER — Ambulatory Visit (HOSPITAL_COMMUNITY): Admission: RE | Admit: 2021-09-10 | Payer: Medicare HMO | Source: Ambulatory Visit

## 2021-09-10 ENCOUNTER — Inpatient Hospital Stay (HOSPITAL_COMMUNITY)
Admission: RE | Admit: 2021-09-10 | Discharge: 2021-09-11 | DRG: 951 | Disposition: A | Discharge status: Hospice | Source: Hospice | Attending: Internal Medicine | Admitting: Internal Medicine

## 2021-09-10 DIAGNOSIS — Z888 Allergy status to other drugs, medicaments and biological substances status: Secondary | ICD-10-CM | POA: Diagnosis not present

## 2021-09-10 DIAGNOSIS — J440 Chronic obstructive pulmonary disease with acute lower respiratory infection: Secondary | ICD-10-CM | POA: Diagnosis present

## 2021-09-10 DIAGNOSIS — E43 Unspecified severe protein-calorie malnutrition: Secondary | ICD-10-CM | POA: Diagnosis not present

## 2021-09-10 DIAGNOSIS — R54 Age-related physical debility: Secondary | ICD-10-CM

## 2021-09-10 DIAGNOSIS — R627 Adult failure to thrive: Secondary | ICD-10-CM | POA: Diagnosis not present

## 2021-09-10 DIAGNOSIS — I4891 Unspecified atrial fibrillation: Secondary | ICD-10-CM | POA: Diagnosis present

## 2021-09-10 DIAGNOSIS — Z90710 Acquired absence of both cervix and uterus: Secondary | ICD-10-CM

## 2021-09-10 DIAGNOSIS — J189 Pneumonia, unspecified organism: Secondary | ICD-10-CM | POA: Diagnosis present

## 2021-09-10 DIAGNOSIS — M5136 Other intervertebral disc degeneration, lumbar region: Secondary | ICD-10-CM | POA: Diagnosis present

## 2021-09-10 DIAGNOSIS — K219 Gastro-esophageal reflux disease without esophagitis: Secondary | ICD-10-CM | POA: Diagnosis not present

## 2021-09-10 DIAGNOSIS — I2510 Atherosclerotic heart disease of native coronary artery without angina pectoris: Secondary | ICD-10-CM | POA: Diagnosis present

## 2021-09-10 DIAGNOSIS — G2581 Restless legs syndrome: Secondary | ICD-10-CM | POA: Diagnosis present

## 2021-09-10 DIAGNOSIS — N179 Acute kidney failure, unspecified: Secondary | ICD-10-CM | POA: Diagnosis not present

## 2021-09-10 DIAGNOSIS — E785 Hyperlipidemia, unspecified: Secondary | ICD-10-CM | POA: Diagnosis present

## 2021-09-10 DIAGNOSIS — R0600 Dyspnea, unspecified: Secondary | ICD-10-CM | POA: Diagnosis not present

## 2021-09-10 DIAGNOSIS — N183 Chronic kidney disease, stage 3 unspecified: Secondary | ICD-10-CM | POA: Diagnosis not present

## 2021-09-10 DIAGNOSIS — E860 Dehydration: Secondary | ICD-10-CM | POA: Diagnosis present

## 2021-09-10 DIAGNOSIS — Z7401 Bed confinement status: Secondary | ICD-10-CM | POA: Diagnosis not present

## 2021-09-10 DIAGNOSIS — Z85038 Personal history of other malignant neoplasm of large intestine: Secondary | ICD-10-CM

## 2021-09-10 DIAGNOSIS — I130 Hypertensive heart and chronic kidney disease with heart failure and stage 1 through stage 4 chronic kidney disease, or unspecified chronic kidney disease: Secondary | ICD-10-CM | POA: Diagnosis present

## 2021-09-10 DIAGNOSIS — Z7189 Other specified counseling: Secondary | ICD-10-CM | POA: Diagnosis not present

## 2021-09-10 DIAGNOSIS — M109 Gout, unspecified: Secondary | ICD-10-CM | POA: Diagnosis present

## 2021-09-10 DIAGNOSIS — I5023 Acute on chronic systolic (congestive) heart failure: Secondary | ICD-10-CM | POA: Diagnosis present

## 2021-09-10 DIAGNOSIS — Z91041 Radiographic dye allergy status: Secondary | ICD-10-CM

## 2021-09-10 DIAGNOSIS — Z9049 Acquired absence of other specified parts of digestive tract: Secondary | ICD-10-CM

## 2021-09-10 DIAGNOSIS — N184 Chronic kidney disease, stage 4 (severe): Secondary | ICD-10-CM | POA: Diagnosis present

## 2021-09-10 DIAGNOSIS — Z8249 Family history of ischemic heart disease and other diseases of the circulatory system: Secondary | ICD-10-CM

## 2021-09-10 DIAGNOSIS — E039 Hypothyroidism, unspecified: Secondary | ICD-10-CM | POA: Diagnosis present

## 2021-09-10 DIAGNOSIS — Z800 Family history of malignant neoplasm of digestive organs: Secondary | ICD-10-CM

## 2021-09-10 DIAGNOSIS — Z515 Encounter for palliative care: Secondary | ICD-10-CM | POA: Diagnosis not present

## 2021-09-10 DIAGNOSIS — J9601 Acute respiratory failure with hypoxia: Secondary | ICD-10-CM | POA: Diagnosis not present

## 2021-09-10 DIAGNOSIS — I25810 Atherosclerosis of coronary artery bypass graft(s) without angina pectoris: Secondary | ICD-10-CM | POA: Diagnosis not present

## 2021-09-10 DIAGNOSIS — Z7901 Long term (current) use of anticoagulants: Secondary | ICD-10-CM

## 2021-09-10 DIAGNOSIS — Z87891 Personal history of nicotine dependence: Secondary | ICD-10-CM

## 2021-09-10 DIAGNOSIS — R531 Weakness: Secondary | ICD-10-CM | POA: Diagnosis not present

## 2021-09-10 DIAGNOSIS — Z881 Allergy status to other antibiotic agents status: Secondary | ICD-10-CM | POA: Diagnosis not present

## 2021-09-10 DIAGNOSIS — Z66 Do not resuscitate: Secondary | ICD-10-CM | POA: Diagnosis not present

## 2021-09-10 DIAGNOSIS — Z79899 Other long term (current) drug therapy: Secondary | ICD-10-CM

## 2021-09-10 DIAGNOSIS — Z20822 Contact with and (suspected) exposure to covid-19: Secondary | ICD-10-CM | POA: Diagnosis not present

## 2021-09-10 DIAGNOSIS — Z79890 Hormone replacement therapy (postmenopausal): Secondary | ICD-10-CM

## 2021-09-10 DIAGNOSIS — E1122 Type 2 diabetes mellitus with diabetic chronic kidney disease: Secondary | ICD-10-CM | POA: Diagnosis present

## 2021-09-10 DIAGNOSIS — I129 Hypertensive chronic kidney disease with stage 1 through stage 4 chronic kidney disease, or unspecified chronic kidney disease: Secondary | ICD-10-CM | POA: Diagnosis not present

## 2021-09-10 LAB — CBC
HCT: 30.2 % — ABNORMAL LOW (ref 36.0–46.0)
Hemoglobin: 9.3 g/dL — ABNORMAL LOW (ref 12.0–15.0)
MCH: 31.6 pg (ref 26.0–34.0)
MCHC: 30.8 g/dL (ref 30.0–36.0)
MCV: 102.7 fL — ABNORMAL HIGH (ref 80.0–100.0)
Platelets: 280 10*3/uL (ref 150–400)
RBC: 2.94 MIL/uL — ABNORMAL LOW (ref 3.87–5.11)
RDW: 14.4 % (ref 11.5–15.5)
WBC: 12.3 10*3/uL — ABNORMAL HIGH (ref 4.0–10.5)
nRBC: 0.2 % (ref 0.0–0.2)

## 2021-09-10 LAB — BASIC METABOLIC PANEL
Anion gap: 8 (ref 5–15)
BUN: 98 mg/dL — ABNORMAL HIGH (ref 8–23)
CO2: 23 mmol/L (ref 22–32)
Calcium: 9.9 mg/dL (ref 8.9–10.3)
Chloride: 106 mmol/L (ref 98–111)
Creatinine, Ser: 2.09 mg/dL — ABNORMAL HIGH (ref 0.44–1.00)
GFR, Estimated: 22 mL/min — ABNORMAL LOW (ref >60)
Glucose, Bld: 257 mg/dL — ABNORMAL HIGH (ref 70–99)
Potassium: 5.8 mmol/L — ABNORMAL HIGH (ref 3.5–5.1)
Sodium: 137 mmol/L (ref 135–145)

## 2021-09-10 LAB — POTASSIUM: Potassium: 5.1 mmol/L (ref 3.5–5.1)

## 2021-09-10 LAB — MAGNESIUM: Magnesium: 2.4 mg/dL (ref 1.7–2.4)

## 2021-09-10 LAB — GLUCOSE, CAPILLARY
Glucose-Capillary: 241 mg/dL — ABNORMAL HIGH (ref 70–99)
Glucose-Capillary: 273 mg/dL — ABNORMAL HIGH (ref 70–99)
Glucose-Capillary: 239 mg/dL — ABNORMAL HIGH (ref 70–99)

## 2021-09-10 MED ORDER — HALOPERIDOL LACTATE 5 MG/ML IJ SOLN: 0.5000 mg | INTRAMUSCULAR | Status: DC | PRN

## 2021-09-10 MED ORDER — GLYCOPYRROLATE 0.2 MG/ML IJ SOLN: 0.2000 mg | INTRAMUSCULAR | Status: DC | PRN

## 2021-09-10 MED ORDER — HALOPERIDOL 0.5 MG PO TABS: 0.5000 mg | ORAL_TABLET | ORAL | Status: DC | PRN

## 2021-09-10 MED ORDER — DEXTROSE 50 % IV SOLN
1.0000 | Freq: Once | INTRAVENOUS | Status: AC
  Administered 2021-09-10: 08:00:00 50 mL via INTRAVENOUS
  Filled 2021-09-10: qty 50

## 2021-09-10 MED ORDER — GLYCOPYRROLATE 1 MG PO TABS: 1.0000 mg | ORAL_TABLET | ORAL | Status: DC | PRN

## 2021-09-10 MED ORDER — POLYVINYL ALCOHOL 1.4 % OP SOLN: 1.0000 [drp] | Freq: Four times a day (QID) | OPHTHALMIC | Status: DC | PRN

## 2021-09-10 MED ORDER — INSULIN ASPART 100 UNIT/ML IV SOLN
10.0000 [IU] | Freq: Once | INTRAVENOUS | Status: AC
  Administered 2021-09-10: 08:00:00 10 [IU] via INTRAVENOUS

## 2021-09-10 MED ORDER — MORPHINE SULFATE (PF) 2 MG/ML IV SOLN
1.0000 mg | INTRAVENOUS | Status: DC | PRN
  Administered 2021-09-11: 1 mg via INTRAVENOUS
  Filled 2021-09-10: qty 1

## 2021-09-10 MED ORDER — MORPHINE SULFATE (CONCENTRATE) 10 MG/0.5ML PO SOLN: 5.0000 mg | ORAL | Status: DC | PRN

## 2021-09-10 MED ORDER — OXYCODONE HCL 20 MG/ML PO CONC
5.0000 mg | ORAL | Status: DC | PRN
  Administered 2021-09-10: 14:00:00 5 mg via ORAL
  Filled 2021-09-10: qty 1

## 2021-09-10 MED ORDER — LORAZEPAM 2 MG/ML IJ SOLN: 1.0000 mg | INTRAMUSCULAR | Status: DC | PRN

## 2021-09-10 MED ORDER — ACETAMINOPHEN 325 MG PO TABS: 650.0000 mg | ORAL_TABLET | Freq: Four times a day (QID) | ORAL | Status: DC | PRN

## 2021-09-10 MED ORDER — LORAZEPAM 2 MG/ML PO CONC: 1.0000 mg | ORAL | Status: DC | PRN

## 2021-09-10 MED ORDER — LORAZEPAM 1 MG PO TABS: 1.0000 mg | ORAL_TABLET | ORAL | Status: DC | PRN

## 2021-09-10 MED ORDER — HALOPERIDOL LACTATE 2 MG/ML PO CONC: 0.5000 mg | ORAL | Status: DC | PRN

## 2021-09-10 MED ORDER — SODIUM ZIRCONIUM CYCLOSILICATE 10 G PO PACK
10.0000 g | PACK | Freq: Once | ORAL | Status: AC
Start: 2021-09-10 — End: 2021-09-10
  Administered 2021-09-10: 08:00:00 10 g via ORAL
  Filled 2021-09-10: qty 1

## 2021-09-10 MED ORDER — MORPHINE SULFATE (CONCENTRATE) 10 MG/0.5ML PO SOLN
5.0000 mg | ORAL | Status: DC | PRN
  Administered 2021-09-10 – 2021-09-11 (×3): 5 mg via ORAL
  Filled 2021-09-10 (×3): qty 0.5

## 2021-09-10 MED ORDER — OXYCODONE HCL 20 MG/ML PO CONC: 5.0000 mg | ORAL | Status: DC | PRN

## 2021-09-10 MED ORDER — ACETAMINOPHEN 650 MG RE SUPP: 650.0000 mg | Freq: Four times a day (QID) | RECTAL | Status: DC | PRN

## 2021-09-10 MED ORDER — BIOTENE DRY MOUTH MT LIQD: 15.0000 mL | OROMUCOSAL | Status: DC | PRN

## 2021-09-10 NOTE — Progress Notes (Signed)
? ?                                                                                                                                                     ?                                                   ?Daily Progress Note  ? ?Patient Name: Debra [name]       Date: 09/10/2021 ?DOB: 1928-11-27  Age: 88 y.o. MRN#: [id number] ?Attending Physician: Heath Lark D, DO ?Primary Care Physician: Sharilyn Sites, MD ?Admit Date: 09/03/2021 ? ?Reason for Consultation/Follow-up: Establishing goals of care ? ?Patient Profile/HPI: 88 y.o. female  with past medical history of HTN, CKD, CAD, CHF, DM, a-fib with eliquis admitted on 09/03/2021 with cough, fever and dyspnea. Workup revealed pneumonia, elevated troponins, acute kidney injury on chronic kidney disease, malnutrition. Palliative medicine consulted for [place].   ? ?Subjective: ?Met with patients daughter [name] again this morning.  ?Patient continues not to eat or drink. She is sleeping more than she is awake. Her Cr improved with IV fluids- but is likely to increase again once IV fluids are stopped due to no po intake.  ?[name] inquired about hospice facility- and I believe this is reasonable.  ?Discussed what a transition to full comfort measures only would look like- d/c medications that are not indicated for comfort- provide medications and interventions for alleviating any suffering or symptoms of dying. [name] and her sister are in agreement with this and would like referral to inpatient hospice.  ? ?Review of Systems  ?Unable to perform ROS: Acuity of condition  ? ? ?Physical Exam ?Vitals and nursing note reviewed.  ?Constitutional:   ?   Appearance: She is ill-appearing.  ?   Comments: Frail, cachectic  ?Neurological:  ?   Comments: lethargic  ?         ? ?Vital Signs: BP 132/86 (BP Location: Left Arm)   Pulse (!) 108   Temp (!) 97.4 ?F (36.3 ?C) (Oral)   Resp 18   Ht 5' 7"  (1.702 m)   Wt 49.4 kg   SpO2 98%   BMI 17.06 kg/m?  ?SpO2: SpO2: 98 % ?O2 Device: O2 Device:  Room Air ?O2 Flow Rate: O2 Flow Rate (L/min): 3 L/min ? ?Intake/output summary:  ?Intake/Output Summary (Last 24 hours) at 09/10/2021 1159 ?Last data filed at 09/10/2021 0900 ?Gross per 24 hour  ?Intake 807.74 ml  ?Output 600 ml  ?Net 207.74 ml  ? ?LBM: Last BM Date : 09/08/21 ?Baseline Weight: Weight: 49.4 kg ?Most recent weight: Weight: 49.4 kg ? ?     ?Palliative Assessment/Data: PPS: 20% ? ? ? ? ? ?  Patient Active Problem List  ? Diagnosis Date Noted  ? Protein-calorie malnutrition, severe 09/10/2021  ? PNA (pneumonia) 09/04/2021  ? Dyspnea 09/03/2021  ? CAP (community acquired pneumonia) 09/03/2021  ? Excessive cerumen in both ear canals 11/26/2020  ? Presbycusis of both ears 11/26/2020  ? Hypotension   ? Cellulitis 02/22/2019  ? Adjustment disorder with mixed anxiety and depressed mood 12/21/2018  ? Benign essential HTN 02/07/2018  ? Dizziness 02/07/2018  ? Elevated troponin 02/07/2018  ? Acute on chronic systolic CHF (congestive heart failure) ([place]) 12/30/2017  ? Non-ST elevation (NSTEMI) myocardial infarction ([place]) 12/26/2017  ? Palpitations 10/03/2017  ? Allergic rhinitis 09/20/2017  ? Asthma 09/20/2017  ? Carpal tunnel syndrome 09/20/2017  ? Diabetes with neurologic complications ([place]) 39/53/2023  ? Generalized osteoarthritis of hand 09/20/2017  ? GERD (gastroesophageal reflux disease) 09/20/2017  ? Hiatal hernia 09/20/2017  ? Positive ANA (antinuclear antibody) 09/20/2017  ? Restless leg 09/20/2017  ? Sciatica 09/20/2017  ? General weakness 05/07/2017  ? 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 UTI (urinary tract infection) 03/20/2017  ? Hypertension 03/20/2017  ? AKI (acute kidney injury) ([place]) 03/20/2017  ? Chronic renal insufficiency, stage 3 (moderate) ([place]) 03/20/2017  ? Rheumatoid arthritis ([place]) 03/20/2017  ? Type 2 diabetes mellitus ([place]) 03/20/2017  ? COPD (chronic obstructive pulmonary disease) ([place]) 06/03/2016  ? Anxiety 05/09/2012  ? Colon cancer ([place]) 03/01/2011  ? CAD (coronary artery disease)  07/25/2010  ? Hyperlipidemia, unspecified 07/25/2010  ? Hypothyroidism 07/25/2010  ? PAD (peripheral artery disease) ([place]) 07/25/2010  ? ? ?Palliative Care Assessment & Plan  ? ? ?Assessment/Recommendations/Plan ? ?Failure to thrive in the setting of multiple comorbitities and acute pneumonia- plan for transition to full comfort measures only ?Hospice facility placement requested ? ? ?Code Status: ?DNR ? ?Prognosis: ? < 2 weeks ? ?Discharge Planning: ?Hospice facility ? ?Care plan was discussed with patient's daughter and care team.  ? ?Thank you for allowing the Palliative Medicine Team to assist in the care of this patient. ? ?Total time:  70 minutes ? ? ? ?Mariana Kaufman, AGNP-C ?Palliative Medicine ? ? ?Please contact Palliative Medicine Team phone at [phone] for questions and concerns.  ? ? ? ? ? ? ?

## 2021-09-10 NOTE — Discharge Summary (Signed)
Physician Discharge Summary  Debra [name] PNT:[id number] DOB: [birth year] DOA: 09/03/2021  PCP: Sharilyn Sites, MD  Admit date: 09/03/2021  Discharge date: 09/10/2021  Admitted From:Home  Disposition:  GIP  CODE STATUS: Comfort care   Brief/Interim Summary: Debra [name]  is a 88 y.o. female, with past medical history of hypertension, CKD, CAD, CHF with EF of 40%, diabetes, A-fib on Eliquis, patient presents to ED secondary to complaints of cough, patient report cough has been going on for the last week, productive with yellow phlegm, she does report some fever and chills, and congestion, she does report some mild dyspnea as well, denies any chest pain, hemoptysis, no coffee-ground emesis, she denies any sick contacts, no dysuria, no polyuria. -In ED patient was dyspneic, initial x-ray with no evidence of pneumonia, but CT confirmed pneumonia, creatinine at 1.65, troponins 184>> 222, she denies any chest pain, no EKG changes, patient was started on IV Rocephin and azithromycin   09/09/21: Patient is on doxycycline for community-acquired pneumonia and was also started on steroids which are being weaned.  She continues to have high blood glucose levels and poor appetite as well as signs of dehydration with elevating AKI.  She will be started on IV fluid today.   09/10/21: Patient has been transitioned to comfort care after palliative consultation that began on 3/8.  Further discussion today with daughter who would like hospice facility placement, however beds are not currently available.  She will remain on comfort care under GIP with a prognosis of less than 2 weeks.  Discharge Diagnoses:  Principal Problem:   Dyspnea Active Problems:   CAP (community acquired pneumonia)   Asthma   Hypertension   Chronic renal insufficiency, stage 3 (moderate) (HCC)   Type 2 diabetes mellitus (HCC)   CAD (coronary artery disease)   GERD (gastroesophageal reflux disease)   PNA (pneumonia)   Protein-calorie  malnutrition, severe  Principal discharge diagnosis: Failure to thrive in the setting of multiple comorbidities with community-acquired pneumonia.  Discharge Instructions  Discharge Instructions     Diet - low sodium heart healthy   Complete by: As directed    Increase activity slowly   Complete by: As directed         Allergies  Allergen Reactions   Ciprofloxacin Rash and Itching   Iodine    Zolpidem     Other reaction(s): Other (See Comments) hallucination   Iodinated Contrast Media Rash    Consultations: Palliative care   Procedures/Studies: CT CHEST WO CONTRAST  Result Date: 09/03/2021 CLINICAL DATA:  Cough, short of breath EXAM: CT CHEST WITHOUT CONTRAST TECHNIQUE: Multidetector CT imaging of the chest was performed following the standard protocol without IV contrast. RADIATION DOSE REDUCTION: This exam was performed according to the departmental dose-optimization program which includes automated exposure control, adjustment of the mA and/or kV according to patient size and/or use of iterative reconstruction technique. COMPARISON:  CT chest 12/26/2017 FINDINGS: Cardiovascular: Coronary artery calcification and aortic atherosclerotic calcification. Mediastinum/Nodes: No axillary or supraclavicular adenopathy. No mediastinal or hilar adenopathy. No pericardial fluid. Esophagus normal. Lungs/Pleura: Nodular airspace densities in the RIGHT lower lobe in a bronchovascular distribution. Resection margin in the RIGHT upper lung without discrete nodularity. LEFT lung clear. Upper Abdomen: Limited view of the liver, kidneys, pancreas are unremarkable. Normal adrenal glands. Atrophic LEFT kidney Musculoskeletal: No aggressive osseous lesion. Degenerative osteophytosis of the spine. IMPRESSION: 1. Findings most consistent with RIGHT lower lobe bronchopneumonia. As there is a significant nodular component, recommend follow-up CT after antibiotic  therapy to ensure resolution and exclude  malignant nodularity (not favored). 2. Coronary artery calcification and Aortic Atherosclerosis (ICD10-I70.0). Electronically Signed   By: Suzy Bouchard M.D.   On: 09/03/2021 17:57   CT Lumbar Spine Wo Contrast  Result Date: 09/01/2021 CLINICAL DATA:  88 year old female with acute low back pain. EXAM: CT LUMBAR SPINE WITHOUT CONTRAST TECHNIQUE: Multidetector CT imaging of the lumbar spine was performed without intravenous contrast administration. Multiplanar CT image reconstructions were also generated. RADIATION DOSE REDUCTION: This exam was performed according to the departmental dose-optimization program which includes automated exposure control, adjustment of the mA and/or kV according to patient size and/or use of iterative reconstruction technique. COMPARISON:  Lumbar MRI 03/31/2019. FINDINGS: Segmentation: Normal, the same numbering system used on the 2020 MRI. Alignment: Straightening of lumbar lordosis appears stable since 2020, with mild chronic levoconvex lumbar scoliosis. However, subtle grade 1 anterolisthesis of L3 on L4 appears increased. Vertebrae: No acute osseous abnormality identified. Visible sacrum and SI joints appear intact. Paraspinal and other soft tissues: Extensive Aortoiliac calcified atherosclerosis. Bilateral renal artery and Common iliac artery vascular stents identified. Vascular patency is not evaluated in the absence of IV contrast. Evidence of prior cholecystectomy. Chronic left renal atrophy and renal cysts. Lumbar paraspinal soft tissues appear within normal limits. Disc levels: Chronic lumbar spine degeneration, most notable for: L2-L3: Chronic disc space loss with vacuum disc and circumferential but mostly right far lateral disc osteophyte complex. Mild chronic spinal and mild to moderate right lateral recess and right foraminal stenosis. This level appears stable since 2020. L3-L4: Subtle anterolisthesis with circumferential disc osteophyte complex and moderate to  severe ligament flavum hypertrophy. Mild to moderate spinal stenosis here appears progressed. Chronic left lateral recess and foraminal stenosis not definitely changed. L4-L5: Chronic severe disc space loss with circumferential disc osteophyte complex and moderate posterior element hypertrophy greater on the left. Chronic moderate left lateral recess and severe left L4 foraminal stenosis. This level appears stable since 2020. L5-S1: Chronic disc space loss with vacuum disc. Circumferential disc osteophyte complex and moderate facet hypertrophy. Chronic mild spinal stenosis and moderate to severe L5 foraminal stenosis greater on the left. This level appears stable. IMPRESSION: 1. No acute osseous abnormality in the lumbar spine. 2. Chronic lumbar spine degeneration appears stable from a 2020 MRI aside from mild new anterolisthesis of L3 on L4, with evidence of up to moderate increased multifactorial spinal stenosis at that level. Chronic severe stenosis at the left L4 and L5 nerve levels. 3. Aortic Atherosclerosis (ICD10-I70.0). Renal and iliac artery vascular stents. Electronically Signed   By: Genevie Ann M.D.   On: 09/01/2021 06:34   DG CHEST PORT 1 VIEW  Result Date: 09/07/2021 CLINICAL DATA:  Shortness of breath and weakness. EXAM: PORTABLE CHEST 1 VIEW COMPARISON:  Chest x-ray dated September 05, 2021. FINDINGS: Stable cardiomediastinal silhouette with mild cardiomegaly. Worsened patchy consolidation in the right lower lobe. Similar postsurgical changes in the right upper lobe. The left lung remains clear. No pneumothorax or large pleural effusion. No acute osseous abnormality. IMPRESSION: 1. Worsened right lower lobe pneumonia. Electronically Signed   By: Titus Dubin M.D.   On: 09/07/2021 12:22   DG CHEST PORT 1 VIEW  Result Date: 09/05/2021 CLINICAL DATA:  Shortness of breath EXAM: PORTABLE CHEST 1 VIEW COMPARISON:  09/03/2021 FINDINGS: Transverse diameter of heart is increased. Metallic sutures are seen in  the sternum. There are no signs of alveolar pulmonary edema or new focal infiltrates. There is elevation of minor fissure.  Linear densities in the right upper lung fields suggest scarring with no significant interval change. Surgical staples are seen in the right upper lung fields. IMPRESSION: Cardiomegaly. There are no new infiltrates or signs of pulmonary edema. Electronically Signed   By: Elmer Picker M.D.   On: 09/05/2021 13:37   DG Chest Portable 1 View  Result Date: 09/03/2021 CLINICAL DATA:  Cough, shortness of breath. EXAM: PORTABLE CHEST 1 VIEW COMPARISON:  March 19, 2021. FINDINGS: Stable cardiomediastinal silhouette. Sternotomy wires are noted. Postoperative changes are noted in the right upper lobe. No acute pulmonary disease is noted. Bony thorax is unremarkable. IMPRESSION: No active disease. Electronically Signed   By: Marijo Conception M.D.   On: 09/03/2021 14:17     Discharge Exam: Vitals:   09/10/21 0826 09/10/21 1428  BP:  105/82  Pulse:  (!) 115  Resp:  18  Temp:  97.8 F (36.6 C)  SpO2: 98% 99%   Vitals:   09/09/21 2050 09/10/21 0309 09/10/21 0826 09/10/21 1428  BP:  132/86  105/82  Pulse:  (!) 108  (!) 115  Resp:  18  18  Temp:  (!) 97.4 F (36.3 C)  97.8 F (36.6 C)  TempSrc:  Oral  Oral  SpO2: 98% 99% 98% 99%  Weight:      Height:        General: Pt is alert, awake, not in acute distress Cardiovascular: RRR, S1/S2 +, no rubs, no gallops Respiratory: CTA bilaterally, no wheezing, no rhonchi Abdominal: Soft, NT, ND, bowel sounds + Extremities: no edema, no cyanosis    The results of significant diagnostics from this hospitalization (including imaging, microbiology, ancillary and laboratory) are listed below for reference.     Microbiology: Recent Results (from the past 240 hour(s))  Blood culture (routine x 2)     Status: None   Collection Time: 09/03/21  1:55 PM   Specimen: Right Antecubital; Blood  Result Value Ref Range Status    Specimen Description RIGHT ANTECUBITAL  Final   Special Requests   Final    BOTTLES DRAWN AEROBIC AND ANAEROBIC Blood Culture adequate volume   Culture   Final    NO GROWTH 5 DAYS Performed at [facility], [address], [place], [place] [address]    Report Status 09/08/2021 FINAL  Final  Resp Panel by RT-PCR (Flu A&B, Covid) Nasopharyngeal Swab     Status: None   Collection Time: 09/03/21  1:55 PM   Specimen: Nasopharyngeal Swab; Nasopharyngeal(NP) swabs in vial transport medium  Result Value Ref Range Status   SARS Coronavirus 2 by RT PCR NEGATIVE NEGATIVE Final    Comment: (NOTE) SARS-CoV-2 target nucleic acids are NOT DETECTED.  The SARS-CoV-2 RNA is generally detectable in upper respiratory specimens during the acute phase of infection. The lowest concentration of SARS-CoV-2 viral copies this assay can detect is 138 copies/mL. A negative result does not preclude SARS-Cov-2 infection and should not be used as the sole basis for treatment or other patient management decisions. A negative result may occur with  improper specimen collection/handling, submission of specimen other than nasopharyngeal swab, presence of viral mutation(s) within the areas targeted by this assay, and inadequate number of viral copies(<138 copies/mL). A negative result must be combined with clinical observations, patient history, and epidemiological information. The expected result is Negative.  Fact Sheet for Patients:  EntrepreneurPulse.com.au  Fact Sheet for Healthcare Providers:  IncredibleEmployment.be  This test is no t yet approved or cleared by the [place]  FDA and  has been authorized for detection and/or diagnosis of SARS-CoV-2 by FDA under an Emergency Use Authorization (EUA). This EUA will remain  in effect (meaning this test can be used) for the duration of the COVID-19 declaration under Section 564(b)(1) of the Act, 21 U.S.C.section  360bbb-3(b)(1), unless the authorization is terminated  or revoked sooner.       Influenza A by PCR NEGATIVE NEGATIVE Final   Influenza B by PCR NEGATIVE NEGATIVE Final    Comment: (NOTE) The Xpert Xpress SARS-CoV-2/FLU/RSV plus assay is intended as an aid in the diagnosis of influenza from Nasopharyngeal swab specimens and should not be used as a sole basis for treatment. Nasal washings and aspirates are unacceptable for Xpert Xpress SARS-CoV-2/FLU/RSV testing.  Fact Sheet for Patients: EntrepreneurPulse.com.au  Fact Sheet for Healthcare Providers: IncredibleEmployment.be  This test is not yet approved or cleared by the [place] FDA and has been authorized for detection and/or diagnosis of SARS-CoV-2 by FDA under an Emergency Use Authorization (EUA). This EUA will remain in effect (meaning this test can be used) for the duration of the COVID-19 declaration under Section 564(b)(1) of the Act, 21 U.S.C. section 360bbb-3(b)(1), unless the authorization is terminated or revoked.  Performed at [facility], [address] [place], [place] [address]   Blood culture (routine x 2)     Status: None   Collection Time: 09/03/21  2:00 PM   Specimen: Left Antecubital; Blood  Result Value Ref Range Status   Specimen Description LEFT ANTECUBITAL  Final   Special Requests   Final    BOTTLES DRAWN AEROBIC AND ANAEROBIC Blood Culture adequate volume   Culture   Final    NO GROWTH 5 DAYS Performed at [facility], [address] [place], [place] [address]    Report Status 09/08/2021 FINAL  Final     Labs: BNP (last 3 results) Recent Labs    03/19/21 0930 09/07/21 0547  BNP 2,527.0* >5,056.9*   Basic Metabolic Panel: Recent Labs  Lab 09/06/21 0431 09/07/21 0547 09/08/21 0606 09/09/21 0530 09/10/21 0545 09/10/21 1153  NA 140 137 135 135 137  --   K 4.4 3.9 4.5 4.8 5.8* 5.1  CL 107 106 106 104 106  --   CO2 25 24 19* 22 23  --    GLUCOSE 177* 152* 280* 327* 257*  --   BUN 48* 62* 67* 88* 98*  --   CREATININE 1.96* 2.07* 2.01* 2.23* 2.09*  --   CALCIUM 8.9 8.8* 9.4 9.7 9.9  --   MG  --   --   --   --  2.4  --    Liver Function Tests: No results for input(s): AST, ALT, ALKPHOS, BILITOT, PROT, ALBUMIN in the last 168 hours. No results for input(s): LIPASE, AMYLASE in the last 168 hours. No results for input(s): AMMONIA in the last 168 hours. CBC: Recent Labs  Lab 09/06/21 0431 09/07/21 0547 09/08/21 0606 09/09/21 0530 09/10/21 0545  WBC 12.7* 7.4 8.5 12.2* 12.3*  HGB 9.4* 9.0* 9.7* 9.5* 9.3*  HCT 29.7* 29.0* 30.5* 29.2* 30.2*  MCV 102.8* 102.5* 104.5* 102.8* 102.7*  PLT 173 165 199 257 280   Cardiac Enzymes: No results for input(s): CKTOTAL, CKMB, CKMBINDEX, TROPONINI in the last 168 hours. BNP: Invalid input(s): POCBNP CBG: Recent Labs  Lab 09/09/21 1132 09/09/21 1641 09/09/21 2156 09/10/21 0712 09/10/21 1144  GLUCAP 253* 304* 235* 241* 273*   D-Dimer No results for input(s): DDIMER in the last 72  hours. Hgb A1c No results for input(s): HGBA1C in the last 72 hours. Lipid Profile No results for input(s): CHOL, HDL, LDLCALC, TRIG, CHOLHDL, LDLDIRECT in the last 72 hours. Thyroid function studies No results for input(s): TSH, T4TOTAL, T3FREE, THYROIDAB in the last 72 hours.  Invalid input(s): FREET3 Anemia work up No results for input(s): VITAMINB12, FOLATE, FERRITIN, TIBC, IRON, RETICCTPCT in the last 72 hours. Urinalysis    Component Value Date/Time   COLORURINE YELLOW 09/04/2021 1243   APPEARANCEUR CLEAR 09/04/2021 1243   LABSPEC 1.017 09/04/2021 1243   PHURINE 5.0 09/04/2021 1243   GLUCOSEU NEGATIVE 09/04/2021 1243   HGBUR SMALL (A) 09/04/2021 1243   BILIRUBINUR NEGATIVE 09/04/2021 1243   KETONESUR NEGATIVE 09/04/2021 1243   PROTEINUR 30 (A) 09/04/2021 1243   NITRITE NEGATIVE 09/04/2021 1243   LEUKOCYTESUR NEGATIVE 09/04/2021 1243   Sepsis Labs Invalid input(s):  PROCALCITONIN,  WBC,  LACTICIDVEN Microbiology Recent Results (from the past 240 hour(s))  Blood culture (routine x 2)     Status: None   Collection Time: 09/03/21  1:55 PM   Specimen: Right Antecubital; Blood  Result Value Ref Range Status   Specimen Description RIGHT ANTECUBITAL  Final   Special Requests   Final    BOTTLES DRAWN AEROBIC AND ANAEROBIC Blood Culture adequate volume   Culture   Final    NO GROWTH 5 DAYS Performed at [facility], [address] [place], [place] [address]    Report Status 09/08/2021 FINAL  Final  Resp Panel by RT-PCR (Flu A&B, Covid) Nasopharyngeal Swab     Status: None   Collection Time: 09/03/21  1:55 PM   Specimen: Nasopharyngeal Swab; Nasopharyngeal(NP) swabs in vial transport medium  Result Value Ref Range Status   SARS Coronavirus 2 by RT PCR NEGATIVE NEGATIVE Final    Comment: (NOTE) SARS-CoV-2 target nucleic acids are NOT DETECTED.  The SARS-CoV-2 RNA is generally detectable in upper respiratory specimens during the acute phase of infection. The lowest concentration of SARS-CoV-2 viral copies this assay can detect is 138 copies/mL. A negative result does not preclude SARS-Cov-2 infection and should not be used as the sole basis for treatment or other patient management decisions. A negative result may occur with  improper specimen collection/handling, submission of specimen other than nasopharyngeal swab, presence of viral mutation(s) within the areas targeted by this assay, and inadequate number of viral copies(<138 copies/mL). A negative result must be combined with clinical observations, patient history, and epidemiological information. The expected result is Negative.  Fact Sheet for Patients:  EntrepreneurPulse.com.au  Fact Sheet for Healthcare Providers:  IncredibleEmployment.be  This test is no t yet approved or cleared by the [place] FDA and  has been authorized for detection and/or  diagnosis of SARS-CoV-2 by FDA under an Emergency Use Authorization (EUA). This EUA will remain  in effect (meaning this test can be used) for the duration of the COVID-19 declaration under Section 564(b)(1) of the Act, 21 U.S.C.section 360bbb-3(b)(1), unless the authorization is terminated  or revoked sooner.       Influenza A by PCR NEGATIVE NEGATIVE Final   Influenza B by PCR NEGATIVE NEGATIVE Final    Comment: (NOTE) The Xpert Xpress SARS-CoV-2/FLU/RSV plus assay is intended as an aid in the diagnosis of influenza from Nasopharyngeal swab specimens and should not be used as a sole basis for treatment. Nasal washings and aspirates are unacceptable for Xpert Xpress SARS-CoV-2/FLU/RSV testing.  Fact Sheet for Patients: EntrepreneurPulse.com.au  Fact Sheet for Healthcare Providers: IncredibleEmployment.be  This  test is not yet approved or cleared by the [place] and has been authorized for detection and/or diagnosis of SARS-CoV-2 by FDA under an Emergency Use Authorization (EUA). This EUA will remain in effect (meaning this test can be used) for the duration of the COVID-19 declaration under Section 564(b)(1) of the Act, 21 U.S.C. section 360bbb-3(b)(1), unless the authorization is terminated or revoked.  Performed at [facility], [address], [place], [place] [address]   Blood culture (routine x 2)     Status: None   Collection Time: 09/03/21  2:00 PM   Specimen: Left Antecubital; Blood  Result Value Ref Range Status   Specimen Description LEFT ANTECUBITAL  Final   Special Requests   Final    BOTTLES DRAWN AEROBIC AND ANAEROBIC Blood Culture adequate volume   Culture   Final    NO GROWTH 5 DAYS Performed at [facility], [address] [place], [place] [address]    Report Status 09/08/2021 FINAL  Final     Time coordinating discharge: 35 minutes  SIGNED:   Rodena Goldmann, DO [facility] 09/10/2021, 3:05  PM  If 7PM-7AM, please contact night-coverage [email or web address]

## 2021-09-10 NOTE — TOC Progression Note (Signed)
Transition of Care (TOC) - Progression Note  ? ? ?Patient Details  ?Name: Debra [name] ?MRN: [id number] ?Date of Birth: [birth year] ? ?Transition of Care (TOC) CM/SW Contact  ?Nathanial Arrighi D, LCSW ?Phone Number: ?09/10/2021, 12:07 PM ? ?Clinical Narrative:    ?Patient referred to [facility]. ? ? ?Expected Discharge Plan: [place] ?Barriers to Discharge: Continued Medical Work up ? ?Expected Discharge Plan and Services ?Expected Discharge Plan: [place] ?  ?  ?  ?  ?                ?  ?  ?  ?  ?  ?HH Arranged: PT ?[place] Agency: [place] ?Date HH Agency Contacted: 09/09/21 ?Time [place]: 1400 ?Representative spoke with at [place]: Marjory Lies ? ? ?Social Determinants of Health (SDOH) Interventions ?  ? ?Readmission Risk Interventions ?No flowsheet data found. ? ?

## 2021-09-10 NOTE — H&P (Signed)
History and Physical    Debra [name] ZHG:[id number] DOB: [birth year] DOA: 09/10/2021  PCP: Sharilyn Sites, MD   Patient coming from: Hospital  Chief Complaint: GIP transition  HPI: Debra [name]  is a 88 y.o. female, with past medical history of hypertension, CKD, CAD, CHF with EF of 40%, diabetes, A-fib on Eliquis, patient presents to ED secondary to complaints of cough, patient report cough has been going on for the last week, productive with yellow phlegm, she does report some fever and chills, and congestion, she does report some mild dyspnea as well, denies any chest pain, hemoptysis, no coffee-ground emesis, she denies any sick contacts, no dysuria, no polyuria. -In ED patient was dyspneic, initial x-ray with no evidence of pneumonia, but CT confirmed pneumonia, creatinine at 1.65, troponins 184>> 222, she denies any chest pain, no EKG changes, patient was started on IV Rocephin and azithromycin   09/09/21: Patient is on doxycycline for community-acquired pneumonia and was also started on steroids which are being weaned.  She continues to have high blood glucose levels and poor appetite as well as signs of dehydration with elevating AKI.  She will be started on IV fluid today.    09/10/21: Patient has been transitioned to comfort care after palliative consultation that began on 3/8.  Further discussion today with daughter who would like hospice facility placement, however beds are not currently available.  She will remain on comfort care under GIP with a prognosis of less than 2 weeks.  Review of Systems: Reviewed as noted above, otherwise negative.  Past Medical History:  Diagnosis Date   Arthritis    CKD (chronic kidney disease) stage 4, GFR 15-29 ml/min (HCC)    Colon cancer (HCC)    COPD (chronic obstructive pulmonary disease) ([place])    Coronary artery disease    a. s/p CABG in 1997 (no detials provided) b. 12/2017: NSTEMI with cath showing patent LIMA-LAD with occluded SVG-OM and presumed  occluded SVG-RCA but unable to be visualized, RCA was filled by collaterals. Medical management recommended. c.  07/2019: NSTEMI with rapid atrial fibrillation, stable coronary and bypass graft anatomy by cardiac catheterization   DDD (degenerative disc disease), lumbar    Diabetic neuropathy (HCC)    Essential hypertension    GERD (gastroesophageal reflux disease)    Gout    Hypothyroidism    Ischemic cardiomyopathy    Restless leg syndrome    Type 2 diabetes mellitus ([place])     Past Surgical History:  Procedure Laterality Date   ABDOMINAL HYSTERECTOMY     CHOLECYSTECTOMY     COLON RESECTION     CORONARY ARTERY BYPASS GRAFT     ILIAC ARTERY STENT     LEFT HEART CATH AND CORS/GRAFTS ANGIOGRAPHY N/A 12/28/2017   Procedure: LEFT HEART CATH AND CORS/GRAFTS ANGIOGRAPHY;  Surgeon: Martinique, Peter M, MD;  Location: [facility];  Service: Cardiovascular;  Laterality: N/A;   LEFT HEART CATH AND CORS/GRAFTS ANGIOGRAPHY N/A 08/01/2019   Procedure: LEFT HEART CATH AND CORS/GRAFTS ANGIOGRAPHY;  Surgeon: Burnell Blanks, MD;  Location: [facility];  Service: Cardiovascular;  Laterality: N/A;   LUNG SURGERY Left 2012   left lung resection   RENAL ARTERY STENT     THYROID SURGERY     ULTRASOUND GUIDANCE FOR VASCULAR ACCESS  12/28/2017   Procedure: Ultrasound Guidance For Vascular Access;  Surgeon: Martinique, Peter M, MD;  Location: [facility];  Service: Cardiovascular;;     reports that she quit  smoking about 25 years ago. Her smoking use included cigarettes. She has never used smokeless tobacco. She reports that she does not drink alcohol and does not use drugs.  Allergies  Allergen Reactions   Ciprofloxacin Rash and Itching   Iodine    Zolpidem     Other reaction(s): Other (See Comments) hallucination   Iodinated Contrast Media Rash    Family History  Problem Relation Age of Onset   Colon cancer Mother    Heart disease Father    Heart attack Sister    Breast  cancer Daughter     Prior to Admission medications   Medication Sig Start Date End Date Taking? Authorizing Provider  acetaminophen (TYLENOL) 325 MG tablet Take 650 mg by mouth every 6 (six) hours as needed for mild pain.    [provider]  albuterol (VENTOLIN HFA) 108 (90 Base) MCG/ACT inhaler Inhale 1 puff into the lungs every 6 (six) hours as needed for shortness of breath or wheezing.    [provider]  amiodarone (PACERONE) 200 MG tablet Take 100 mg by mouth daily. 08/30/20   [provider]  apixaban (ELIQUIS) 2.5 MG TABS tablet Take 1 tablet (2.5 mg total) by mouth 2 (two) times daily. 07/21/21   [name], MD  atorvastatin (LIPITOR) 80 MG tablet Take 1 tablet (80 mg total) by mouth daily at 6 PM. 12/31/17   Strader, [place] M, PA-C  benzonatate (TESSALON) 100 MG capsule Take 200 mg by mouth 2 (two) times daily. 08/31/21   [provider]  Cholecalciferol (VITAMIN D) 125 MCG (5000 UT) CAPS Take 1 capsule by mouth daily.    [provider]  diazepam (VALIUM) 2 MG tablet Take 2 mg by mouth daily as needed. 06/21/19   [provider]  furosemide (LASIX) 20 MG tablet Take 1 tablet (20 mg total) by mouth daily. 03/19/21   Isla Pence, MD  gabapentin (NEURONTIN) 100 MG capsule Take 100 mg by mouth 3 (three) times daily.  03/09/18   [provider]  HYDROcodone-acetaminophen (NORCO) 10-325 MG tablet Take 1 tablet by mouth every 6 (six) hours as needed for moderate pain or severe pain. 1 tab po hs/ and prn.    [provider]  levothyroxine (SYNTHROID) 125 MCG tablet Take 125 mcg by mouth daily. 08/27/21   [provider]  Menthol, Topical Analgesic, (ASPERCREME MAX ROLL-ON) 16 % LIQD Apply 1 application topically as needed.    [provider]  polyethylene glycol (MIRALAX / GLYCOLAX) 17 g packet Take 17 g by mouth daily as needed for mild constipation. 08/02/19   Georgette Shell, MD   sacubitril-valsartan (ENTRESTO) 49-51 MG Take 1 tablet by mouth 2 (two) times daily. 05/21/21   [name], MD    Physical Exam: There were no vitals filed for this visit.  Constitutional: NAD, calm, comfortable There were no vitals filed for this visit. Eyes: lids and conjunctivae normal Neck: normal, supple Respiratory: clear to auscultation bilaterally. Normal respiratory effort. No accessory muscle use.  Cardiovascular: Regular rate and rhythm, no murmurs. Abdomen: no tenderness, no distention. Bowel sounds positive.  Musculoskeletal:  No edema. Skin: no rashes, lesions, ulcers.  Psychiatric: Flat affect  Labs on Admission: I have personally reviewed following labs and imaging studies  CBC: Recent Labs  Lab 09/06/21 0431 09/07/21 0547 09/08/21 0606 09/09/21 0530 09/10/21 0545  WBC 12.7* 7.4 8.5 12.2* 12.3*  HGB 9.4* 9.0* 9.7* 9.5* 9.3*  HCT 29.7* 29.0* 30.5* 29.2* 30.2*  MCV 102.8* 102.5* 104.5* 102.8* 102.7*  PLT 173 165 199 257 144   Basic Metabolic Panel: Recent Labs  Lab 09/06/21 0431 09/07/21 0547 09/08/21 0606 09/09/21 0530 09/10/21 0545 09/10/21 1153  NA 140 137 135 135 137  --   K 4.4 3.9 4.5 4.8 5.8* 5.1  CL 107 106 106 104 106  --   CO2 25 24 19* 22 23  --   GLUCOSE 177* 152* 280* 327* 257*  --   BUN 48* 62* 67* 88* 98*  --   CREATININE 1.96* 2.07* 2.01* 2.23* 2.09*  --   CALCIUM 8.9 8.8* 9.4 9.7 9.9  --   MG  --   --   --   --  2.4  --    GFR: Estimated Creatinine Clearance: 14 mL/min (A) (by C-G formula based on SCr of 2.09 mg/dL (H)). Liver Function Tests: No results for input(s): AST, ALT, ALKPHOS, BILITOT, PROT, ALBUMIN in the last 168 hours. No results for input(s): LIPASE, AMYLASE in the last 168 hours. No results for input(s): AMMONIA in the last 168 hours. Coagulation Profile: No results for input(s): INR, PROTIME in the last 168 hours. Cardiac Enzymes: No results for input(s): CKTOTAL, CKMB, CKMBINDEX, TROPONINI in the  last 168 hours. BNP (last 3 results) No results for input(s): PROBNP in the last 8760 hours. HbA1C: No results for input(s): HGBA1C in the last 72 hours. CBG: Recent Labs  Lab 09/09/21 1132 09/09/21 1641 09/09/21 2156 09/10/21 0712 09/10/21 1144  GLUCAP 253* 304* 235* 241* 273*   Lipid Profile: No results for input(s): CHOL, HDL, LDLCALC, TRIG, CHOLHDL, LDLDIRECT in the last 72 hours. Thyroid Function Tests: No results for input(s): TSH, T4TOTAL, FREET4, T3FREE, THYROIDAB in the last 72 hours. Anemia Panel: No results for input(s): VITAMINB12, FOLATE, FERRITIN, TIBC, IRON, RETICCTPCT in the last 72 hours. Urine analysis:    Component Value Date/Time   COLORURINE YELLOW 09/04/2021 1243   APPEARANCEUR CLEAR 09/04/2021 1243   LABSPEC 1.017 09/04/2021 1243   PHURINE 5.0 09/04/2021 1243   GLUCOSEU NEGATIVE 09/04/2021 1243   HGBUR SMALL (A) 09/04/2021 1243   BILIRUBINUR NEGATIVE 09/04/2021 1243   KETONESUR NEGATIVE 09/04/2021 1243   PROTEINUR 30 (A) 09/04/2021 1243   NITRITE NEGATIVE 09/04/2021 1243   LEUKOCYTESUR NEGATIVE 09/04/2021 1243    Radiological Exams on Admission: No results found.   Assessment/Plan Active Problems:   * No active hospital problems. *    Failure to thrive in the setting of community-acquired pneumonia with multiple comorbidities -Patient has been fully transition to comfort care and is now under GIP status -Prognosis less than 2 weeks   DVT prophylaxis: None Code Status: DNR/Comfort care Family Communication: Daughter at bedside 3/9 Disposition Plan:GIP-Comfort care Consults called:Palliative Admission status: Inpatient, MedSurg  Severity of Illness: The appropriate patient status for this patient is INPATIENT. Inpatient status is judged to be reasonable and necessary in order to provide the required intensity of service to ensure the patient's safety. The patient's presenting symptoms, physical exam findings, and initial radiographic and  laboratory data in the context of their chronic comorbidities is felt to place them at high risk for further clinical deterioration. Furthermore, it is not anticipated that the patient will be medically stable for discharge from the hospital within 2 midnights of admission.   * I certify that at the point of admission it is my clinical judgment that the patient will require inpatient hospital care spanning beyond 2 midnights from the point of admission due to  high intensity of service, high risk for further deterioration and high frequency of surveillance required.*   Delaine Canter D Lequisha Cammack DO [facility]  If 7PM-7AM, please contact night-coverage [email or web address]  09/10/2021, 4:13 PM

## 2021-09-10 NOTE — Discharge Summary (Signed)
Physician Discharge Summary  ?Debra [name]:[id number] DOB: 07-11-1928 DOA: 09/03/2021 ? ?PCP: Sharilyn Sites, MD ? ?Admit date: 09/03/2021 ? ?Discharge date: 09/10/2021 ? ?Admitted From:Home ? ?Disposition:  GIP ? ?CODE STATUS: Comfort care ? ? ?Brief/Interim Summary: ?Debra [name]  is a 88 y.o. female, with past medical history of hypertension, CKD, CAD, CHF with EF of 40%, diabetes, A-fib on Eliquis, patient presents to ED secondary to complaints of cough, patient report cough has been going on for the last week, productive with yellow phlegm, she does report some fever and chills, and congestion, she does report some mild dyspnea as well, denies any chest pain, hemoptysis, no coffee-ground emesis, she denies any sick contacts, no dysuria, no polyuria. ?-In ED patient was dyspneic, initial x-ray with no evidence of pneumonia, but CT confirmed pneumonia, creatinine at 1.65, troponins 184>> 222, she denies any chest pain, no EKG changes, patient was started on IV Rocephin and azithromycin ?  ?09/09/21: Patient is on doxycycline for community-acquired pneumonia and was also started on steroids which are being weaned.  She continues to have high blood glucose levels and poor appetite as well as signs of dehydration with elevating AKI.  She will be started on IV fluid today.  ? ?09/10/21: Patient has been transitioned to comfort care after palliative consultation that began on 3/8.  Further discussion today with daughter who would like hospice facility placement, however beds are not currently available.  She will remain on comfort care under GIP with a prognosis of less than 2 weeks. ? ?Discharge Diagnoses:  ?Principal Problem: ?  Dyspnea ?Active Problems: ?  CAP (community acquired pneumonia) ?  Asthma ?  Hypertension ?  Chronic renal insufficiency, stage 3 (moderate) (HCC) ?  Type 2 diabetes mellitus (Debra [name]) ?  CAD (coronary artery disease) ?  GERD (gastroesophageal reflux disease) ?  PNA (pneumonia) ?  Protein-calorie  malnutrition, severe ? ?Principal discharge diagnosis: Failure to thrive in the setting of multiple comorbidities with community-acquired pneumonia. ? ?Discharge Instructions ? ?Discharge Instructions   ? ? Diet - low sodium heart healthy   Complete by: As directed ?  ? Increase activity slowly   Complete by: As directed ?  ? ?  ? ? ? ?Allergies  ?Allergen Reactions  ? Ciprofloxacin Rash and Itching  ? Iodine   ? Zolpidem   ?  Other reaction(s): Other (See Comments) ?hallucination  ? Iodinated Contrast Media Rash  ? ? ?Consultations: ?Palliative care ? ? ?Procedures/Studies: ?CT CHEST WO CONTRAST ? ?Result Date: 09/03/2021 ?CLINICAL DATA:  Cough, short of breath EXAM: CT CHEST WITHOUT CONTRAST TECHNIQUE: Multidetector CT imaging of the chest was performed following the standard protocol without IV contrast. RADIATION DOSE REDUCTION: This exam was performed according to the departmental dose-optimization program which includes automated exposure control, adjustment of the mA and/or kV according to patient size and/or use of iterative reconstruction technique. COMPARISON:  CT chest 12/26/2017 FINDINGS: Cardiovascular: Coronary artery calcification and aortic atherosclerotic calcification. Mediastinum/Nodes: No axillary or supraclavicular adenopathy. No mediastinal or hilar adenopathy. No pericardial fluid. Esophagus normal. Lungs/Pleura: Nodular airspace densities in the RIGHT lower lobe in a bronchovascular distribution. Resection margin in the RIGHT upper lung without discrete nodularity. LEFT lung clear. Upper Abdomen: Limited view of the liver, kidneys, pancreas are unremarkable. Normal adrenal glands. Atrophic LEFT kidney Musculoskeletal: No aggressive osseous lesion. Degenerative osteophytosis of the spine. IMPRESSION: 1. Findings most consistent with RIGHT lower lobe bronchopneumonia. As there is a significant nodular component, recommend follow-up CT after antibiotic  therapy to ensure resolution and exclude  malignant nodularity (not favored). 2. Coronary artery calcification and Aortic Atherosclerosis (ICD10-I70.0). Electronically Signed   By: Suzy Bouchard M.D.   On: 09/03/2021 17:57  ? ?CT Lumbar Spine Wo Contrast ? ?Result Date: 09/01/2021 ?CLINICAL DATA:  88 year old female with acute low back pain. EXAM: CT LUMBAR SPINE WITHOUT CONTRAST TECHNIQUE: Multidetector CT imaging of the lumbar spine was performed without intravenous contrast administration. Multiplanar CT image reconstructions were also generated. RADIATION DOSE REDUCTION: This exam was performed according to the departmental dose-optimization program which includes automated exposure control, adjustment of the mA and/or kV according to patient size and/or use of iterative reconstruction technique. COMPARISON:  Lumbar MRI 03/31/2019. FINDINGS: Segmentation: Normal, the same numbering system used on the 2020 MRI. Alignment: Straightening of lumbar lordosis appears stable since 2020, with mild chronic levoconvex lumbar scoliosis. However, subtle grade 1 anterolisthesis of L3 on L4 appears increased. Vertebrae: No acute osseous abnormality identified. Visible sacrum and SI joints appear intact. Paraspinal and other soft tissues: Extensive Aortoiliac calcified atherosclerosis. Bilateral renal artery and Common iliac artery vascular stents identified. Vascular patency is not evaluated in the absence of IV contrast. Evidence of prior cholecystectomy. Chronic left renal atrophy and renal cysts. Lumbar paraspinal soft tissues appear within normal limits. Disc levels: Chronic lumbar spine degeneration, most notable for: L2-L3: Chronic disc space loss with vacuum disc and circumferential but mostly right far lateral disc osteophyte complex. Mild chronic spinal and mild to moderate right lateral recess and right foraminal stenosis. This level appears stable since 2020. L3-L4: Subtle anterolisthesis with circumferential disc osteophyte complex and moderate to  severe ligament flavum hypertrophy. Mild to moderate spinal stenosis here appears progressed. Chronic left lateral recess and foraminal stenosis not definitely changed. L4-L5: Chronic severe disc space loss with circumferential disc osteophyte complex and moderate posterior element hypertrophy greater on the left. Chronic moderate left lateral recess and severe left L4 foraminal stenosis. This level appears stable since 2020. L5-S1: Chronic disc space loss with vacuum disc. Circumferential disc osteophyte complex and moderate facet hypertrophy. Chronic mild spinal stenosis and moderate to severe L5 foraminal stenosis greater on the left. This level appears stable. IMPRESSION: 1. No acute osseous abnormality in the lumbar spine. 2. Chronic lumbar spine degeneration appears stable from a 2020 MRI aside from mild new anterolisthesis of L3 on L4, with evidence of up to moderate increased multifactorial spinal stenosis at that level. Chronic severe stenosis at the left L4 and L5 nerve levels. 3. Aortic Atherosclerosis (ICD10-I70.0). Renal and iliac artery vascular stents. Electronically Signed   By: Genevie Ann M.D.   On: 09/01/2021 06:34  ? ?DG CHEST PORT 1 VIEW ? ?Result Date: 09/07/2021 ?CLINICAL DATA:  Shortness of breath and weakness. EXAM: PORTABLE CHEST 1 VIEW COMPARISON:  Chest x-ray dated September 05, 2021. FINDINGS: Stable cardiomediastinal silhouette with mild cardiomegaly. Worsened patchy consolidation in the right lower lobe. Similar postsurgical changes in the right upper lobe. The left lung remains clear. No pneumothorax or large pleural effusion. No acute osseous abnormality. IMPRESSION: 1. Worsened right lower lobe pneumonia. Electronically Signed   By: Titus Dubin M.D.   On: 09/07/2021 12:22  ? ?DG CHEST PORT 1 VIEW ? ?Result Date: 09/05/2021 ?CLINICAL DATA:  Shortness of breath EXAM: PORTABLE CHEST 1 VIEW COMPARISON:  09/03/2021 FINDINGS: Transverse diameter of heart is increased. Metallic sutures are seen in  the sternum. There are no signs of alveolar pulmonary edema or new focal infiltrates. There is elevation of minor fissure.  Linear densities in the right upper lung fields suggest scarring with no significant interval

## 2021-09-10 NOTE — Discharge Summary (Deleted)
  The note originally documented on this encounter has been moved the the encounter in which it belongs.  

## 2021-09-11 DIAGNOSIS — R627 Adult failure to thrive: Secondary | ICD-10-CM

## 2021-09-11 DIAGNOSIS — Z7189 Other specified counseling: Secondary | ICD-10-CM

## 2021-09-11 DIAGNOSIS — J189 Pneumonia, unspecified organism: Secondary | ICD-10-CM

## 2021-09-11 NOTE — Progress Notes (Addendum)
PROGRESS NOTE    Debra [name]  NUU:[id number] DOB: December 17, 1928 DOA: 09/10/2021 PCP: Sharilyn Sites, MD   Brief Narrative:  Per HPI: Debra [name]  is a 88 y.o. female, with past medical history of hypertension, CKD, CAD, CHF with EF of 40%, diabetes, A-fib on Eliquis, patient presents to ED secondary to complaints of cough, patient report cough has been going on for the last week, productive with yellow phlegm, she does report some fever and chills, and congestion, she does report some mild dyspnea as well, denies any chest pain, hemoptysis, no coffee-ground emesis, she denies any sick contacts, no dysuria, no polyuria. -In ED patient was dyspneic, initial x-ray with no evidence of pneumonia, but CT confirmed pneumonia, creatinine at 1.65, troponins 184>> 222, she denies any chest pain, no EKG changes, patient was started on IV Rocephin and azithromycin   09/09/21: Patient is on doxycycline for community-acquired pneumonia and was also started on steroids which are being weaned.  She continues to have high blood glucose levels and poor appetite as well as signs of dehydration with elevating AKI.  She will be started on IV fluid today.    09/10/21: Patient has been transitioned to comfort care after palliative consultation that began on 3/8.  Further discussion today with daughter who would like hospice facility placement, however beds are not currently available.  She will remain on comfort care under GIP with a prognosis of less than 2 weeks.  09/11/21: Patient remains on comfort measures under GIP status.  There is no hospice facility bed available.  Please refer to discharge summary dictated 3/9.    Assessment & Plan:   Active Problems:   * No active hospital problems. *  Assessment and Plan:  Failure to thrive in the setting of community-acquired pneumonia with multiple comorbidities -Patient has been fully transition to comfort care and is now under GIP status -Prognosis less than 2 weeks     DVT prophylaxis: None Code Status: DNR/comfort care Family Communication: Daughter at bedside 3/9 Disposition Plan: GIP-comfort care   Consultants:  Palliative  Procedures:  None  Antimicrobials:  None   Subjective: Patient seen and evaluated today and appears to be comfortable.  Objective: Vitals:   09/10/21 2057  BP: 130/72  Pulse: 72  Resp: 17  Temp: 98.5 F (36.9 C)  SpO2: 100%    Intake/Output Summary (Last 24 hours) at 09/11/2021 1211 Last data filed at 09/11/2021 0900 Gross per 24 hour  Intake 135 ml  Output 100 ml  Net 35 ml   There were no vitals filed for this visit.  Examination:  General exam: Appears calm and comfortable  Respiratory system: Clear to auscultation. Respiratory effort normal. Cardiovascular system: S1 & S2 heard, RRR.  Gastrointestinal system: Abdomen is soft Central nervous system: Lethargic/drowsy Extremities: No edema Skin: No significant lesions noted Psychiatry: Flat affect.    Data Reviewed: I have personally reviewed following labs and imaging studies  CBC: Recent Labs  Lab 09/06/21 0431 09/07/21 0547 09/08/21 0606 09/09/21 0530 09/10/21 0545  WBC 12.7* 7.4 8.5 12.2* 12.3*  HGB 9.4* 9.0* 9.7* 9.5* 9.3*  HCT 29.7* 29.0* 30.5* 29.2* 30.2*  MCV 102.8* 102.5* 104.5* 102.8* 102.7*  PLT 173 165 199 257 347   Basic Metabolic Panel: Recent Labs  Lab 09/06/21 0431 09/07/21 0547 09/08/21 0606 09/09/21 0530 09/10/21 0545 09/10/21 1153  NA 140 137 135 135 137  --   K 4.4 3.9 4.5 4.8 5.8* 5.1  CL 107 106 106 104  106  --   CO2 25 24 19* 22 23  --   GLUCOSE 177* 152* 280* 327* 257*  --   BUN 48* 62* 67* 88* 98*  --   CREATININE 1.96* 2.07* 2.01* 2.23* 2.09*  --   CALCIUM 8.9 8.8* 9.4 9.7 9.9  --   MG  --   --   --   --  2.4  --    GFR: Estimated Creatinine Clearance: 14 mL/min (A) (by C-G formula based on SCr of 2.09 mg/dL (H)). Liver Function Tests: No results for input(s): AST, ALT, ALKPHOS, BILITOT, PROT,  ALBUMIN in the last 168 hours. No results for input(s): LIPASE, AMYLASE in the last 168 hours. No results for input(s): AMMONIA in the last 168 hours. Coagulation Profile: No results for input(s): INR, PROTIME in the last 168 hours. Cardiac Enzymes: No results for input(s): CKTOTAL, CKMB, CKMBINDEX, TROPONINI in the last 168 hours. BNP (last 3 results) No results for input(s): PROBNP in the last 8760 hours. HbA1C: No results for input(s): HGBA1C in the last 72 hours. CBG: Recent Labs  Lab 09/09/21 1641 09/09/21 2156 09/10/21 0712 09/10/21 1144 09/10/21 1641  GLUCAP 304* 235* 241* 273* 239*   Lipid Profile: No results for input(s): CHOL, HDL, LDLCALC, TRIG, CHOLHDL, LDLDIRECT in the last 72 hours. Thyroid Function Tests: No results for input(s): TSH, T4TOTAL, FREET4, T3FREE, THYROIDAB in the last 72 hours. Anemia Panel: No results for input(s): VITAMINB12, FOLATE, FERRITIN, TIBC, IRON, RETICCTPCT in the last 72 hours. Sepsis Labs: Recent Labs  Lab 09/07/21 0547  PROCALCITON 2.72    Recent Results (from the past 240 hour(s))  Blood culture (routine x 2)     Status: None   Collection Time: 09/03/21  1:55 PM   Specimen: Right Antecubital; Blood  Result Value Ref Range Status   Specimen Description RIGHT ANTECUBITAL  Final   Special Requests   Final    BOTTLES DRAWN AEROBIC AND ANAEROBIC Blood Culture adequate volume   Culture   Final    NO GROWTH 5 DAYS Performed at [facility], [address], [place], [place] [address]    Report Status 09/08/2021 FINAL  Final  Resp Panel by RT-PCR (Flu A&B, Covid) Nasopharyngeal Swab     Status: None   Collection Time: 09/03/21  1:55 PM   Specimen: Nasopharyngeal Swab; Nasopharyngeal(NP) swabs in vial transport medium  Result Value Ref Range Status   SARS Coronavirus 2 by RT PCR NEGATIVE NEGATIVE Final    Comment: (NOTE) SARS-CoV-2 target nucleic acids are NOT DETECTED.  The SARS-CoV-2 RNA is generally detectable in upper  respiratory specimens during the acute phase of infection. The lowest concentration of SARS-CoV-2 viral copies this assay can detect is 138 copies/mL. A negative result does not preclude SARS-Cov-2 infection and should not be used as the sole basis for treatment or other patient management decisions. A negative result may occur with  improper specimen collection/handling, submission of specimen other than nasopharyngeal swab, presence of viral mutation(s) within the areas targeted by this assay, and inadequate number of viral copies(<138 copies/mL). A negative result must be combined with clinical observations, patient history, and epidemiological information. The expected result is Negative.  Fact Sheet for Patients:  EntrepreneurPulse.com.au  Fact Sheet for Healthcare Providers:  IncredibleEmployment.be  This test is no t yet approved or cleared by the [place] FDA and  has been authorized for detection and/or diagnosis of SARS-CoV-2 by FDA under an Emergency Use Authorization (EUA). This EUA will remain  in effect (meaning this test can be used) for the duration of the COVID-19 declaration under Section 564(b)(1) of the Act, 21 U.S.C.section 360bbb-3(b)(1), unless the authorization is terminated  or revoked sooner.       Influenza A by PCR NEGATIVE NEGATIVE Final   Influenza B by PCR NEGATIVE NEGATIVE Final    Comment: (NOTE) The Xpert Xpress SARS-CoV-2/FLU/RSV plus assay is intended as an aid in the diagnosis of influenza from Nasopharyngeal swab specimens and should not be used as a sole basis for treatment. Nasal washings and aspirates are unacceptable for Xpert Xpress SARS-CoV-2/FLU/RSV testing.  Fact Sheet for Patients: EntrepreneurPulse.com.au  Fact Sheet for Healthcare Providers: IncredibleEmployment.be  This test is not yet approved or cleared by the [place] FDA and has been  authorized for detection and/or diagnosis of SARS-CoV-2 by FDA under an Emergency Use Authorization (EUA). This EUA will remain in effect (meaning this test can be used) for the duration of the COVID-19 declaration under Section 564(b)(1) of the Act, 21 U.S.C. section 360bbb-3(b)(1), unless the authorization is terminated or revoked.  Performed at [facility], [address] [place], [place] [address]   Blood culture (routine x 2)     Status: None   Collection Time: 09/03/21  2:00 PM   Specimen: Left Antecubital; Blood  Result Value Ref Range Status   Specimen Description LEFT ANTECUBITAL  Final   Special Requests   Final    BOTTLES DRAWN AEROBIC AND ANAEROBIC Blood Culture adequate volume   Culture   Final    NO GROWTH 5 DAYS Performed at [facility], [address], [place], [place] [address]    Report Status 09/08/2021 FINAL  Final         Radiology Studies: No results found.     LOS: 1 day    Time spent: 35 minutes    Maddux Vanscyoc Darleen Crocker, DO [facility]  If 7PM-7AM, please contact night-coverage [email or web address] 09/11/2021, 12:11 PM

## 2021-09-11 NOTE — Progress Notes (Addendum)
? ? ?                                                                                                                                                     ?                                                   ?Daily Progress Note  ? ?Patient Name: Debra [name]       Date: 09/11/2021 ?DOB: [birth year]  Age: 88 y.o. MRN#: [id number] ?Attending Physician: Heath Lark D, DO ?Primary Care Physician: Sharilyn Sites, MD ?Admit Date: 09/10/2021 ? ?Reason for Consultation/Follow-up: Establishing goals of care ? ?Patient Profile/HPI: 88 y.o. female  with past medical history of HTN, CKD, CAD, CHF, DM, a-fib with eliquis admitted on 09/03/2021 with cough, fever and dyspnea. Workup revealed pneumonia, elevated troponins, acute kidney injury on chronic kidney disease, malnutrition. Palliative medicine consulted for [place].   ? ?Subjective: ?Chart reviewed including progress notes and medication. usage. ?Report received from patient's bedside nurse Stanton Kidney, RN.  Also, discussed care with hospice nurse [place]. ?  Jacome has required 3 total doses of morphine in the last 24 hours for a total of 15 mg oral morphine. ?She is lethargic today.  ?Hospice nurses at bedside she has a bed available at the hospice house today. ?Spoke with her daughter Alphonzo Cruise was inquiring again about advanced directives and HCPOA.  Discussed with [name] my concerns that [facility] does not have capacity at this point to complete those documents.  ?We discussed that in [place] in the absence of a documented HCPOA that all the siblings and consensus or a 38 of the available sibling are the designated surrogate decision makers.  [name] states that her and her sister are in agreement with the plan her brother is en route and she feels that he will also be in agreement.  She is going to speak with him and confirm. ? ? ?Review of Systems  ?Unable to perform ROS: Acuity of condition  ? ? ?Physical Exam ?Vitals and nursing note reviewed.  ?Constitutional:   ?    Appearance: She is ill-appearing.  ?   Comments: Frail, cachectic  ?Neurological:  ?   Comments: lethargic  ?         ? ?Vital Signs: BP 130/72 (BP Location: Right Arm)   Pulse 72   Temp 98.5 ?F (36.9 ?C)   Resp 17   SpO2 100%  ?SpO2: SpO2: 100 % ?O2 Device: O2 Device: Room Air ?O2 Flow Rate:   ? ?Intake/output summary:  ?Intake/Output Summary (Last 24 hours) at 09/11/2021 1033 ?Last data filed at 09/11/2021 0900 ?Gross per 24 hour  ?Intake  135 ml  ?Output 100 ml  ?Net 35 ml  ? ? ?LBM:   ?Baseline Weight:   ?Most recent weight:   ? ?     ?Palliative Assessment/Data: PPS: 20% ? ? ? ? ? ?Patient Active Problem List  ? Diagnosis Date Noted  ?? Protein-calorie malnutrition, severe 09/10/2021  ?? PNA (pneumonia) 09/04/2021  ?? Dyspnea 09/03/2021  ?? CAP (community acquired pneumonia) 09/03/2021  ?? Excessive cerumen in both ear canals 11/26/2020  ?? Presbycusis of both ears 11/26/2020  ?? Hypotension   ?? Cellulitis 02/22/2019  ?? Adjustment disorder with mixed anxiety and depressed mood 12/21/2018  ?? Benign essential HTN 02/07/2018  ?? Dizziness 02/07/2018  ?? Elevated troponin 02/07/2018  ?? Acute on chronic systolic CHF (congestive heart failure) ([place]) 12/30/2017  ?? Non-ST elevation (NSTEMI) myocardial infarction ([place]) 12/26/2017  ?? Palpitations 10/03/2017  ?? Allergic rhinitis 09/20/2017  ?? Asthma 09/20/2017  ?? Carpal tunnel syndrome 09/20/2017  ?? Diabetes with neurologic complications ([place]) 19/41/7408  ?? Generalized osteoarthritis of hand 09/20/2017  ?? GERD (gastroesophageal reflux disease) 09/20/2017  ?? Hiatal hernia 09/20/2017  ?? Positive ANA (antinuclear antibody) 09/20/2017  ?? Restless leg 09/20/2017  ?? Sciatica 09/20/2017  ?? General weakness 05/07/2017  ?? 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 UTI (urinary tract infection) 03/20/2017  ?? Hypertension 03/20/2017  ?? AKI (acute kidney injury) ([place]) 03/20/2017  ?? Chronic renal insufficiency, stage 3 (moderate) ([place]) 03/20/2017  ?? Rheumatoid  arthritis ([place]) 03/20/2017  ?? Type 2 diabetes mellitus ([place]) 03/20/2017  ?? COPD (chronic obstructive pulmonary disease) ([place]) 06/03/2016  ?? Anxiety 05/09/2012  ?? Colon cancer ([place]) 03/01/2011  ?? CAD (coronary artery disease) 07/25/2010  ?? Hyperlipidemia, unspecified 07/25/2010  ?? Hypothyroidism 07/25/2010  ?? PAD (peripheral artery disease) ([place]) 07/25/2010  ? ? ?Palliative Care Assessment & Plan  ? ? ?Assessment/Recommendations/Plan ? ?Failure to thrive in the setting of multiple comorbitities and acute pneumonia- plan for transition to full comfort measures only ?Continue current comfort measures no escalation of care plan for DC to hospice facility ? ? ?Code Status: ?DNR ? ?Prognosis: ? < 2 weeks ? ?Discharge Planning: ?Hospice facility ? ?Care plan was discussed with patient's daughter and care team.  ? ?Thank you for allowing the Palliative Medicine Team to assist in the care of this patient. ? ?Mariana Kaufman, AGNP-C ?Palliative Medicine ? ? ?Please contact Palliative Medicine Team phone at [phone] for questions and concerns.  ? ? ? ? ? ? ? ?

## 2021-09-11 NOTE — Hospital Course (Addendum)
Per HPI: ?Debra [name]  is a 88 y.o. female, with past medical history of hypertension, CKD, CAD, CHF with EF of 40%, diabetes, A-fib on Eliquis, patient presents to ED secondary to complaints of cough, patient report cough has been going on for the last week, productive with yellow phlegm, she does report some fever and chills, and congestion, she does report some mild dyspnea as well, denies any chest pain, hemoptysis, no coffee-ground emesis, she denies any sick contacts, no dysuria, no polyuria. ?-In ED patient was dyspneic, initial x-ray with no evidence of pneumonia, but CT confirmed pneumonia, creatinine at 1.65, troponins 184>> 222, she denies any chest pain, no EKG changes, patient was started on IV Rocephin and azithromycin ?  ?09/09/21: Patient is on doxycycline for community-acquired pneumonia and was also started on steroids which are being weaned.  She continues to have high blood glucose levels and poor appetite as well as signs of dehydration with elevating AKI.  She will be started on IV fluid today.  ?  ?09/10/21: Patient has been transitioned to comfort care after palliative consultation that began on 3/8.  Further discussion today with daughter who would like hospice facility placement, however beds are not currently available.  She will remain on comfort care under GIP with a prognosis of less than 2 weeks. ? ?09/11/21: Patient remains on comfort measures under GIP status.  There is no hospice facility bed available.  Please refer to discharge summary dictated 3/9. ?

## 2021-09-16 ENCOUNTER — Ambulatory Visit: Payer: Medicare HMO | Admitting: Cardiology

## 2021-09-23 ENCOUNTER — Ambulatory Visit: Payer: Medicare HMO | Admitting: Podiatry

## 2021-10-03 DEATH — deceased
